# Patient Record
Sex: Male | Born: 1990 | Race: White | Hispanic: No | Marital: Single | Smoking: Never smoker
Health system: Southern US, Community
[De-identification: ages and names within clinical notes are randomized; demographics above are authoritative.]

## PROBLEM LIST (undated history)

## (undated) DIAGNOSIS — E049 Nontoxic goiter, unspecified: Secondary | ICD-10-CM

## (undated) DIAGNOSIS — E1042 Type 1 diabetes mellitus with diabetic polyneuropathy: Secondary | ICD-10-CM

## (undated) DIAGNOSIS — E1043 Type 1 diabetes mellitus with diabetic autonomic (poly)neuropathy: Secondary | ICD-10-CM

## (undated) DIAGNOSIS — R000 Tachycardia, unspecified: Secondary | ICD-10-CM

## (undated) DIAGNOSIS — E11649 Type 2 diabetes mellitus with hypoglycemia without coma: Secondary | ICD-10-CM

## (undated) DIAGNOSIS — E230 Hypopituitarism: Secondary | ICD-10-CM

## (undated) DIAGNOSIS — E109 Type 1 diabetes mellitus without complications: Secondary | ICD-10-CM

## (undated) DIAGNOSIS — E063 Autoimmune thyroiditis: Secondary | ICD-10-CM

## (undated) DIAGNOSIS — I10 Essential (primary) hypertension: Secondary | ICD-10-CM

## (undated) HISTORY — DX: Hypopituitarism: E23.0

## (undated) HISTORY — DX: Type 1 diabetes mellitus without complications: E10.9

## (undated) HISTORY — DX: Type 1 diabetes mellitus with diabetic autonomic (poly)neuropathy: E10.43

## (undated) HISTORY — PX: OTHER SURGICAL HISTORY: SHX169

## (undated) HISTORY — DX: Type 1 diabetes mellitus with diabetic polyneuropathy: E10.42

## (undated) HISTORY — DX: Type 2 diabetes mellitus with hypoglycemia without coma: E11.649

## (undated) HISTORY — DX: Tachycardia, unspecified: R00.0

## (undated) HISTORY — DX: Nontoxic goiter, unspecified: E04.9

## (undated) HISTORY — DX: Essential (primary) hypertension: I10

## (undated) HISTORY — DX: Autoimmune thyroiditis: E06.3

---
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
## 2010-02-07 ENCOUNTER — Ambulatory Visit: Payer: Self-pay | Admitting: "Endocrinology

## 2010-06-29 ENCOUNTER — Ambulatory Visit: Payer: Self-pay | Admitting: "Endocrinology

## 2010-08-20 ENCOUNTER — Inpatient Hospital Stay (HOSPITAL_COMMUNITY): Admission: EM | Admit: 2010-08-20 | Discharge: 2010-08-23 | Payer: Self-pay | Admitting: Emergency Medicine

## 2010-08-20 ENCOUNTER — Ambulatory Visit: Payer: Self-pay | Admitting: Pulmonary Disease

## 2010-08-29 ENCOUNTER — Ambulatory Visit: Payer: Self-pay | Admitting: Pediatrics

## 2010-10-24 ENCOUNTER — Ambulatory Visit: Payer: Self-pay | Admitting: "Endocrinology

## 2010-12-26 ENCOUNTER — Ambulatory Visit
Admission: RE | Admit: 2010-12-26 | Discharge: 2010-12-26 | Payer: Self-pay | Source: Home / Self Care | Attending: "Endocrinology | Admitting: "Endocrinology

## 2011-02-15 LAB — BASIC METABOLIC PANEL
BUN: 14 mg/dL (ref 6–23)
BUN: 3 mg/dL — ABNORMAL LOW (ref 6–23)
BUN: 4 mg/dL — ABNORMAL LOW (ref 6–23)
BUN: 5 mg/dL — ABNORMAL LOW (ref 6–23)
BUN: 6 mg/dL (ref 6–23)
BUN: 7 mg/dL (ref 6–23)
BUN: 7 mg/dL (ref 6–23)
CO2: 19 mEq/L (ref 19–32)
CO2: 20 mEq/L (ref 19–32)
CO2: 22 mEq/L (ref 19–32)
Calcium: 8.1 mg/dL — ABNORMAL LOW (ref 8.4–10.5)
Calcium: 8.2 mg/dL — ABNORMAL LOW (ref 8.4–10.5)
Calcium: 8.4 mg/dL (ref 8.4–10.5)
Calcium: 8.5 mg/dL (ref 8.4–10.5)
Calcium: 8.5 mg/dL (ref 8.4–10.5)
Calcium: 8.6 mg/dL (ref 8.4–10.5)
Calcium: 8.6 mg/dL (ref 8.4–10.5)
Calcium: 8.6 mg/dL (ref 8.4–10.5)
Calcium: 8.7 mg/dL (ref 8.4–10.5)
Chloride: 101 mEq/L (ref 96–112)
Chloride: 104 mEq/L (ref 96–112)
Chloride: 108 mEq/L (ref 96–112)
Creatinine, Ser: 0.64 mg/dL (ref 0.4–1.5)
Creatinine, Ser: 0.68 mg/dL (ref 0.4–1.5)
Creatinine, Ser: 0.84 mg/dL (ref 0.4–1.5)
Creatinine, Ser: 0.84 mg/dL (ref 0.4–1.5)
Creatinine, Ser: 0.9 mg/dL (ref 0.4–1.5)
Creatinine, Ser: 1.25 mg/dL (ref 0.4–1.5)
Creatinine, Ser: 1.84 mg/dL — ABNORMAL HIGH (ref 0.4–1.5)
GFR calc Af Amer: 58 mL/min — ABNORMAL LOW (ref >60)
GFR calc Af Amer: >60 mL/min (ref >60)
GFR calc Af Amer: >60 mL/min (ref >60)
GFR calc Af Amer: >60 mL/min (ref >60)
GFR calc Af Amer: >60 mL/min (ref >60)
GFR calc Af Amer: >60 mL/min (ref >60)
GFR calc non Af Amer: 48 mL/min — ABNORMAL LOW (ref >60)
GFR calc non Af Amer: >60 mL/min (ref >60)
GFR calc non Af Amer: >60 mL/min (ref >60)
GFR calc non Af Amer: >60 mL/min (ref >60)
GFR calc non Af Amer: >60 mL/min (ref >60)
GFR calc non Af Amer: >60 mL/min (ref >60)
GFR calc non Af Amer: >60 mL/min (ref >60)
GFR calc non Af Amer: >60 mL/min (ref >60)
GFR calc non Af Amer: >60 mL/min (ref >60)
GFR calc non Af Amer: >60 mL/min (ref >60)
GFR calc non Af Amer: >60 mL/min (ref >60)
Glucose, Bld: 136 mg/dL — ABNORMAL HIGH (ref 70–99)
Glucose, Bld: 144 mg/dL — ABNORMAL HIGH (ref 70–99)
Glucose, Bld: 145 mg/dL — ABNORMAL HIGH (ref 70–99)
Glucose, Bld: 148 mg/dL — ABNORMAL HIGH (ref 70–99)
Glucose, Bld: 156 mg/dL — ABNORMAL HIGH (ref 70–99)
Glucose, Bld: 157 mg/dL — ABNORMAL HIGH (ref 70–99)
Glucose, Bld: 161 mg/dL — ABNORMAL HIGH (ref 70–99)
Glucose, Bld: 254 mg/dL — ABNORMAL HIGH (ref 70–99)
Potassium: 3.2 mEq/L — ABNORMAL LOW (ref 3.5–5.1)
Potassium: 3.4 mEq/L — ABNORMAL LOW (ref 3.5–5.1)
Potassium: 3.4 mEq/L — ABNORMAL LOW (ref 3.5–5.1)
Potassium: 3.4 mEq/L — ABNORMAL LOW (ref 3.5–5.1)
Potassium: 3.5 mEq/L (ref 3.5–5.1)
Potassium: 3.8 mEq/L (ref 3.5–5.1)
Potassium: 4 mEq/L (ref 3.5–5.1)
Potassium: 4.5 mEq/L (ref 3.5–5.1)
Potassium: 5.3 mEq/L — ABNORMAL HIGH (ref 3.5–5.1)
Sodium: 135 mEq/L (ref 135–145)
Sodium: 135 mEq/L (ref 135–145)
Sodium: 135 mEq/L (ref 135–145)
Sodium: 137 mEq/L (ref 135–145)
Sodium: 138 mEq/L (ref 135–145)
Sodium: 139 mEq/L (ref 135–145)
Sodium: 139 mEq/L (ref 135–145)
Sodium: 139 mEq/L (ref 135–145)

## 2011-02-15 LAB — DIFFERENTIAL
Basophils Absolute: 0 10*3/uL (ref 0.0–0.1)
Basophils Relative: 0 % (ref 0–1)
Eosinophils Absolute: 0 10*3/uL (ref 0.0–0.7)
Lymphs Abs: 1.4 10*3/uL (ref 0.7–4.0)
Monocytes Absolute: 1.9 10*3/uL — ABNORMAL HIGH (ref 0.1–1.0)
Neutro Abs: 24.4 10*3/uL — ABNORMAL HIGH (ref 1.7–7.7)

## 2011-02-15 LAB — GLUCOSE, CAPILLARY
Glucose-Capillary: 112 mg/dL — ABNORMAL HIGH (ref 70–99)
Glucose-Capillary: 134 mg/dL — ABNORMAL HIGH (ref 70–99)
Glucose-Capillary: 135 mg/dL — ABNORMAL HIGH (ref 70–99)
Glucose-Capillary: 139 mg/dL — ABNORMAL HIGH (ref 70–99)
Glucose-Capillary: 141 mg/dL — ABNORMAL HIGH (ref 70–99)
Glucose-Capillary: 141 mg/dL — ABNORMAL HIGH (ref 70–99)
Glucose-Capillary: 143 mg/dL — ABNORMAL HIGH (ref 70–99)
Glucose-Capillary: 162 mg/dL — ABNORMAL HIGH (ref 70–99)
Glucose-Capillary: 171 mg/dL — ABNORMAL HIGH (ref 70–99)
Glucose-Capillary: 187 mg/dL — ABNORMAL HIGH (ref 70–99)
Glucose-Capillary: 187 mg/dL — ABNORMAL HIGH (ref 70–99)
Glucose-Capillary: 202 mg/dL — ABNORMAL HIGH (ref 70–99)
Glucose-Capillary: 203 mg/dL — ABNORMAL HIGH (ref 70–99)
Glucose-Capillary: 216 mg/dL — ABNORMAL HIGH (ref 70–99)
Glucose-Capillary: 267 mg/dL — ABNORMAL HIGH (ref 70–99)
Glucose-Capillary: 341 mg/dL — ABNORMAL HIGH (ref 70–99)
Glucose-Capillary: 428 mg/dL — ABNORMAL HIGH (ref 70–99)
Glucose-Capillary: 90 mg/dL (ref 70–99)

## 2011-02-15 LAB — CBC
HCT: 35.8 % — ABNORMAL LOW (ref 39.0–52.0)
Hemoglobin: 11.9 g/dL — ABNORMAL LOW (ref 13.0–17.0)
Hemoglobin: 12.9 g/dL — ABNORMAL LOW (ref 13.0–17.0)
MCH: 30 pg (ref 26.0–34.0)
MCHC: 35.5 g/dL (ref 30.0–36.0)
MCHC: 36.6 g/dL — ABNORMAL HIGH (ref 30.0–36.0)
MCV: 84.2 fL (ref 78.0–100.0)
MCV: 84.2 fL (ref 78.0–100.0)
MCV: 84.6 fL (ref 78.0–100.0)
Platelets: 178 10*3/uL (ref 150–400)
Platelets: 191 10*3/uL (ref 150–400)
Platelets: 204 10*3/uL (ref 150–400)
Platelets: 319 10*3/uL (ref 150–400)
RBC: 4.3 MIL/uL (ref 4.22–5.81)
RBC: 5.49 MIL/uL (ref 4.22–5.81)
RDW: 12.3 % (ref 11.5–15.5)
RDW: 12.4 % (ref 11.5–15.5)
RDW: 12.6 % (ref 11.5–15.5)
WBC: 27.7 10*3/uL — ABNORMAL HIGH (ref 4.0–10.5)
WBC: 7.9 10*3/uL (ref 4.0–10.5)

## 2011-02-15 LAB — URINE CULTURE
Colony Count: NO GROWTH
Culture  Setup Time: 201109182241

## 2011-02-15 LAB — COMPREHENSIVE METABOLIC PANEL
ALT: 12 U/L (ref 0–53)
AST: 16 U/L (ref 0–37)
Albumin: 4.6 g/dL (ref 3.5–5.2)
Alkaline Phosphatase: 80 U/L (ref 39–117)
BUN: 17 mg/dL (ref 6–23)
Chloride: 108 mEq/L (ref 96–112)
Potassium: 6 mEq/L — ABNORMAL HIGH (ref 3.5–5.1)
Sodium: 138 mEq/L (ref 135–145)
Total Bilirubin: 1.4 mg/dL — ABNORMAL HIGH (ref 0.3–1.2)
Total Protein: 7.9 g/dL (ref 6.0–8.3)

## 2011-02-15 LAB — POCT I-STAT 3, VENOUS BLOOD GAS (G3P V)
Acid-base deficit: 13 mmol/L — ABNORMAL HIGH (ref 0.0–2.0)
Acid-base deficit: 15 mmol/L — ABNORMAL HIGH (ref 0.0–2.0)
Bicarbonate: 11.4 mEq/L — ABNORMAL LOW (ref 20.0–24.0)
Bicarbonate: 11.8 mEq/L — ABNORMAL LOW (ref 20.0–24.0)
O2 Saturation: 52 %
O2 Saturation: 90 %
pCO2, Ven: 29.8 mmHg — ABNORMAL LOW (ref 45.0–50.0)
pO2, Ven: 33 mmHg (ref 30.0–45.0)
pO2, Ven: 66 mmHg — ABNORMAL HIGH (ref 30.0–45.0)

## 2011-02-15 LAB — URINALYSIS, ROUTINE W REFLEX MICROSCOPIC
Glucose, UA: >1000 mg/dL — AB
Specific Gravity, Urine: 1.028 (ref 1.005–1.030)
pH: 5.5 (ref 5.0–8.0)

## 2011-02-15 LAB — CULTURE, BLOOD (ROUTINE X 2)
Culture: NO GROWTH
Culture: NO GROWTH

## 2011-02-15 LAB — PHOSPHORUS: Phosphorus: 2.4 mg/dL (ref 2.3–4.6)

## 2011-02-15 LAB — LEGIONELLA ANTIGEN, URINE: Legionella Antigen, Urine: NEGATIVE

## 2011-02-15 LAB — POCT CARDIAC MARKERS
CKMB, poc: <1 ng/mL — ABNORMAL LOW (ref 1.0–8.0)
Troponin i, poc: <0.05 ng/mL (ref 0.00–0.09)

## 2011-02-15 LAB — LACTIC ACID, PLASMA
Lactic Acid, Venous: 0.8 mmol/L (ref 0.5–2.2)
Lactic Acid, Venous: 1.2 mmol/L (ref 0.5–2.2)

## 2011-02-15 LAB — CARBOXYHEMOGLOBIN: Carboxyhemoglobin: 1.6 % — ABNORMAL HIGH (ref 0.5–1.5)

## 2011-02-15 LAB — MRSA PCR SCREENING: MRSA by PCR: NEGATIVE

## 2011-02-15 LAB — MAGNESIUM
Magnesium: 1.6 mg/dL (ref 1.5–2.5)
Magnesium: 1.7 mg/dL (ref 1.5–2.5)
Magnesium: 1.8 mg/dL (ref 1.5–2.5)
Magnesium: 2.1 mg/dL (ref 1.5–2.5)

## 2011-02-15 LAB — KETONES, QUALITATIVE

## 2011-02-15 LAB — URINE MICROSCOPIC-ADD ON

## 2011-02-15 LAB — HEMOCCULT GUIAC POC 1CARD (OFFICE): Fecal Occult Bld: NEGATIVE

## 2011-03-14 ENCOUNTER — Ambulatory Visit (INDEPENDENT_AMBULATORY_CARE_PROVIDER_SITE_OTHER): Payer: BLUE CROSS/BLUE SHIELD | Admitting: "Endocrinology

## 2011-03-14 DIAGNOSIS — G909 Disorder of the autonomic nervous system, unspecified: Secondary | ICD-10-CM

## 2011-03-14 DIAGNOSIS — E049 Nontoxic goiter, unspecified: Secondary | ICD-10-CM

## 2011-03-14 DIAGNOSIS — E1069 Type 1 diabetes mellitus with other specified complication: Secondary | ICD-10-CM

## 2011-03-14 DIAGNOSIS — R000 Tachycardia, unspecified: Secondary | ICD-10-CM

## 2011-03-14 DIAGNOSIS — E1065 Type 1 diabetes mellitus with hyperglycemia: Secondary | ICD-10-CM

## 2011-03-26 ENCOUNTER — Ambulatory Visit: Payer: Self-pay | Admitting: "Endocrinology

## 2011-05-18 ENCOUNTER — Encounter: Payer: Self-pay | Admitting: Pediatrics

## 2011-05-18 DIAGNOSIS — E039 Hypothyroidism, unspecified: Secondary | ICD-10-CM

## 2011-05-18 DIAGNOSIS — E1065 Type 1 diabetes mellitus with hyperglycemia: Secondary | ICD-10-CM

## 2011-11-06 ENCOUNTER — Other Ambulatory Visit: Payer: Self-pay | Admitting: *Deleted

## 2011-11-06 DIAGNOSIS — E1065 Type 1 diabetes mellitus with hyperglycemia: Secondary | ICD-10-CM

## 2011-11-10 LAB — COMPREHENSIVE METABOLIC PANEL
ALT: 10 U/L (ref 0–53)
AST: 11 U/L (ref 0–37)
Albumin: 4.5 g/dL (ref 3.5–5.2)
Calcium: 9.5 mg/dL (ref 8.4–10.5)
Chloride: 99 mEq/L (ref 96–112)
Potassium: 4.4 mEq/L (ref 3.5–5.3)

## 2011-11-10 LAB — T4, FREE: Free T4: 1.22 ng/dL (ref 0.80–1.80)

## 2011-11-10 LAB — MICROALBUMIN / CREATININE URINE RATIO
Creatinine, Urine: 282.8 mg/dL
Microalb, Ur: 1.55 mg/dL (ref 0.00–1.89)

## 2011-11-10 LAB — T3, FREE: T3, Free: 3.5 pg/mL (ref 2.3–4.2)

## 2011-11-10 LAB — TSH: TSH: 4.301 u[IU]/mL (ref 0.350–4.500)

## 2011-11-14 ENCOUNTER — Ambulatory Visit (INDEPENDENT_AMBULATORY_CARE_PROVIDER_SITE_OTHER): Payer: BLUE CROSS/BLUE SHIELD | Admitting: "Endocrinology

## 2011-11-14 ENCOUNTER — Encounter: Payer: Self-pay | Admitting: "Endocrinology

## 2011-11-14 VITALS — BP 127/90 | HR 108 | Wt 130.0 lb

## 2011-11-14 DIAGNOSIS — E063 Autoimmune thyroiditis: Secondary | ICD-10-CM

## 2011-11-14 DIAGNOSIS — E1169 Type 2 diabetes mellitus with other specified complication: Secondary | ICD-10-CM

## 2011-11-14 DIAGNOSIS — E1149 Type 2 diabetes mellitus with other diabetic neurological complication: Secondary | ICD-10-CM

## 2011-11-14 DIAGNOSIS — E1143 Type 2 diabetes mellitus with diabetic autonomic (poly)neuropathy: Secondary | ICD-10-CM

## 2011-11-14 DIAGNOSIS — E11649 Type 2 diabetes mellitus with hypoglycemia without coma: Secondary | ICD-10-CM

## 2011-11-14 DIAGNOSIS — R000 Tachycardia, unspecified: Secondary | ICD-10-CM

## 2011-11-14 DIAGNOSIS — G909 Disorder of the autonomic nervous system, unspecified: Secondary | ICD-10-CM

## 2011-11-14 DIAGNOSIS — E291 Testicular hypofunction: Secondary | ICD-10-CM

## 2011-11-14 DIAGNOSIS — E038 Other specified hypothyroidism: Secondary | ICD-10-CM

## 2011-11-14 DIAGNOSIS — E1065 Type 1 diabetes mellitus with hyperglycemia: Secondary | ICD-10-CM

## 2011-11-14 DIAGNOSIS — E1142 Type 2 diabetes mellitus with diabetic polyneuropathy: Secondary | ICD-10-CM

## 2011-11-14 DIAGNOSIS — G609 Hereditary and idiopathic neuropathy, unspecified: Secondary | ICD-10-CM

## 2011-11-14 DIAGNOSIS — I10 Essential (primary) hypertension: Secondary | ICD-10-CM

## 2011-11-14 LAB — GLUCOSE, POCT (MANUAL RESULT ENTRY): POC Glucose: 156

## 2011-11-14 LAB — POCT GLYCOSYLATED HEMOGLOBIN (HGB A1C): Hemoglobin A1C: 7.8

## 2011-11-14 MED ORDER — LISINOPRIL 5 MG PO TABS: 5.0000 mg | ORAL_TABLET | Freq: Every day | ORAL | Status: DC

## 2011-11-14 MED ORDER — LEVOTHYROXINE SODIUM 25 MCG PO TABS: 25.0000 ug | ORAL_TABLET | Freq: Every day | ORAL | Status: DC

## 2011-11-14 NOTE — Patient Instructions (Signed)
Followup visit with me in 3 months. Call me on Saturday evening 11/24/11 to discuss his blood sugar results. New basal rates are as follows: At midnight, 0.95 units per hour. At 5 AM, 0.95 units per hour. At 9 AM, 1.05 units per hour. At noon, 1.25 units per hour. At 6 PM, 1.70 units per hour. At 9 PM, 1.30 units per hour.

## 2011-11-14 NOTE — Progress Notes (Signed)
Subjective:  Patient Name: Scott Moran Date of Birth: [birth year]  MRN: [id number]  Scott Moran  presents to the office today for follow-up of his type 1 diabetes mellitus, goiter, hypoglycemia, depression, hypothyroidism, thyroiditis, autonomic neuropathy, tachycardia, nonproliferative diabetic retinopathy, fatigue, hypogonadism.  HISTORY OF PRESENT ILLNESS:   Scott Moran is a 20 y.o. Caucasian young man. Scott Moran was unaccompanied.  1. I first saw the patient in consultation on 03/27/05 in the [facility]patient [facility] of [facility] in [place], [place]. He been referred by his primary care provider, Dr. Egbert Garibaldi , [facility], her evaluation and treatment of poorly controlled type 1 diabetes. The patient was 20 years old.  A. The patient had been diagnosed with type 1 diabetes in February of 1995 at age 48. He was on an insulin pump at that time I first met him. His hemoglobin A1c had increased from 7.1% in December of 2005 to 8.2% in April 2006. He was having frequent snacks that he was not taking boluses for. He was also frequently noncompliant with checking blood sugars and taking meal boluses. I adjusted his basal rates.  B. At his next [place] visit on 06/19/05, he was still very noncompliant. Mental status was normal. He had 25 g goiter. His father was serving in [place] with the [employer]. There wa a lot of stress at home. I again adjusted his basal rates.  C. At the end of July 2006 we closed her satellite office in [place]. The patient elected to have me follow him at our clinic here in [place]. I have followed him here ever since. 2. During the past six years, the patient has had several clinical issues.   A. T1DM: The patient's blood glucose control has been fairly good for a teenager. His maximum hemoglobin A1c occurred on 04/16/06 when the hemoglobin A1c was 12.1%. Since then the hemoglobin A1c values have varied between 6.9% and 8.4%. He is still fairly  noncompliant with checking blood sugars, bolussing, and changing his insulin pump sites on time, but he is more compliant and more careful than he used to be.   B. Goiter, Hashimoto's thyroiditis, and hypothyroidism: In late 2008 and early 2009 the patient developed hypothyroidism secondary to Hashimoto's disease. I started him on Synthroid, 25 mcg per day. When he takes his medication he is euthyroid.  C. Autonomic neuropathy and tachycardia: In late 2008 he developed autonomic neuropathy which manifested as tachycardia.These problems vary in parallel with his BGs and HbA1c's.  D. Non-proliferative diabetic retinopathy: This problem was identified during a diabetic eye exam in June 2010.  E. Hypogonadotropic hypogonadism: The patient has always been somewhat lackadaisical and relatively passive. On 06/29/10 his FSH was 4.9, LH was 2.4, testosterone was 399.66, and free testosterone was 79.8. The testosterone and free testosterone were relatively low for an 20 year old young man. At the same time the [facility] and LH while "normal", appeared to be somewhat low for the level of testosterone he had. We discussed the options for treatment including exercise and medication touch his AndroGel. The patient chose the exercise option. By 10/19/10 the [facility] was 6.8, the LH was 3.2, total testosterone was 615.88 and the free testosterone was 131.6. It may be that when he was more depressed he has less gonadotropic function. This hypogonadotropic hypogonadism may have resolved. We will continue to followup on this issue over time.  F. The patient has been diagnosed with depression at different times in the past. For some time he took Remeron (mirtazapine), but  discontinued this in early 2012. 3. The patient's last [facility] visit was on 12/26/10.  In the interim, he is not taking Synthroid reliably. He is also not getting insulin boluses reliably. He is somewhat better about checking blood sugars. He denies any major depressive  episodes. . Pertinent Review of Systems:  Constitutional: The patient feels pretty god today. He wakes up around 2 to 3 PM and stays up late at night.  He has no significant complaints. Eyes: Vision is good with classes. There are no significant eye complaints. Last eye exam was November 2011. Neck: The patient has no complaints of anterior neck swelling, soreness, tenderness,  pressure, discomfort, or difficulty swallowing.  Heart: Heart rate sometimes feels fast. Heart rate increases with exercise or other physical activity. The patient has no complaints of palpitations, irregular heat beats, chest pain, or chest pressure. Gastrointestinal: Bowel movents seem normal. The patient has no complaints of excessive hunger, acid reflux, upset stomach, stomach aches or pains, diarrhea, or constipation. Legs: Muscle mass and strength seem normal. There are no complaints of numbness, tingling, burning, or pain. No edema is noted. Feet: There are no obvious foot problems. There are no complaints of numbness, tingling, burning, or pain. No edema is noted. GU: Patient rarely has A.M. Erections. He he is able to gain and maintain. Hypoglycemia: Most low BGs occur from noon to 3 PM, especially when he sleeps in late.  Emotional/psychological: The patient states he is is not depressed. He is "content". 5. BG printout: The patient is checking his blood sugars anywhere from 1-4 times daily. He averages 2 BG checks per day. He boluses anywhere from 0-4 times daily. He averages less than 1 bolus per day. He may go 3 days without bolusing.   PAST MEDICAL, FAMILY, AND SOCIAL HISTORY:  No past medical history on file.  No family history on file.  Current outpatient prescriptions:insulin aspart (NOVOLOG) 100 UNIT/ML injection, Inject into the skin 3 (three) times daily before meals.  , Disp: , Rfl: ;  Levothyroxine Sodium (SYNTHROID PO), Take by mouth. , Disp: , Rfl: ;  Loratadine (CLARITIN PO), Take by mouth.  ,  Disp: , Rfl:   Allergies as of 11/14/2011  . (No Known Allergies)    1. Work and Family: Patient is no longer attending junior college, in part due to family financial problems. Father is still unemployed. The patient is not working and not does not appear to be looking for work. 2. Activities: Few physical activities. 3. Smoking, alcohol, or drugs: None 4. Primary Care Provider: He no longer has a primary care provider.  ROS: There are no other significant problems involving Cecilia's other body systems.   Objective:  Vital Signs:  BP 127/90  Pulse 108  Wt 130 lb (58.968 kg)   Ht Readings from Last 3 Encounters:  No data found for Ht   Wt Readings from Last 3 Encounters:  11/14/11 130 lb (58.968 kg)   There is no height on file to calculate BSA.  PHYSICAL EXAM:  Constitutional: The patient appears healthy and well nourished.  Face: The face appears normal.  Eyes: There is no obvious arcus or proptosis. Moisture appears normal. Mouth: The oropharynx and tongue appear normal. Oral moisture is normal. Neck: The neck appears to be visibly normal. No carotid bruits are noted. The thyroid gland is 20+ grams in size. Right lobe is at the upper limit of normal for size. The left lobe is mildly enlarged. The consistency of the thyroid  gland is slightly firm. The thyroid gland is not tender to palpation. Lungs: The lungs are clear to auscultation. Air movement is good. Heart: Heart rate and rhythm are regular. Heart sounds S1 and S2 are normal. I did not appreciate any pathologic cardiac murmurs. Abdomen: The abdomen appears to be normal in size. Bowel sounds are normal. There is no obvious hepatomegaly, splenomegaly, or other mass effect.  Arms: Muscle size and bulk are normal for age. Hands: There is no obvious tremor. Phalangeal and metacarpophalangeal joints are normal. Palmar muscles are normal. Palmar skin is normal. Palmar moisture is also normal. Legs: Muscles appear normal for  age. No edema is present. Feet: Feet are normally formed. Dorsalis pedal pulses are normal 2+ on the right and 1+ on the left.. Neurologic: Strength is normal for age in both the upper and lower extremities. Muscle tone is normal. Sensation to touch is normal in both legs, but decreased in both heels.    LAB DATA:  Recent Results (from the past 504 hour(s))  TSH   Collection Time   11/06/11  4:01 PM      Component Value Range   TSH 4.301  0.350 - 4.500 (uIU/mL)  T3, FREE   Collection Time   11/06/11  4:01 PM      Component Value Range   T3, Free 3.5  2.3 - 4.2 (pg/mL)  T4, FREE   Collection Time   11/06/11  4:01 PM      Component Value Range   Free T4 1.22  0.80 - 1.80 (ng/dL)  COMPREHENSIVE METABOLIC PANEL   Collection Time   11/06/11  4:01 PM      Component Value Range   Sodium 138  135 - 145 (mEq/L)   Potassium 4.4  3.5 - 5.3 (mEq/L)   Chloride 99  96 - 112 (mEq/L)   CO2 29  19 - 32 (mEq/L)   Glucose, Bld 250 (*) 70 - 99 (mg/dL)   BUN 10  6 - 23 (mg/dL)   Creat 4.54  0.98 - 1.19 (mg/dL)   Total Bilirubin 0.6  0.3 - 1.2 (mg/dL)   Alkaline Phosphatase 57  39 - 117 (U/L)   AST 11  0 - 37 (U/L)   ALT 10  0 - 53 (U/L)   Total Protein 6.5  6.0 - 8.3 (g/dL)   Albumin 4.5  3.5 - 5.2 (g/dL)   Calcium 9.5  8.4 - 14.7 (mg/dL)  LIPID PANEL   Collection Time   11/06/11  4:01 PM      Component Value Range   Cholesterol 132  0 - 200 (mg/dL)   Triglycerides 57  <829 (mg/dL)   HDL 53  >56 (mg/dL)   Total CHOL/HDL Ratio 2.5     VLDL 11  0 - 40 (mg/dL)   LDL Cholesterol 68  0 - 99 (mg/dL)  MICROALBUMIN / CREATININE URINE RATIO   Collection Time   11/06/11  4:01 PM      Component Value Range   Microalb, Ur 1.55  0.00 - 1.89 (mg/dL)   Creatinine, Urine 213.0     Microalb Creat Ratio 5.5  0.0 - 30.0 (mg/g)  GLUCOSE, POCT (MANUAL RESULT ENTRY)   Collection Time   11/14/11 10:02 AM      Component Value Range   POC Glucose 156    POCT GLYCOSYLATED HEMOGLOBIN (HGB A1C)   Collection  Time   11/14/11 10:02 AM      Component Value Range   Hemoglobin A1C  7.8       Assessment and Plan:   ASSESSMENT:  1. Type 1 diabetes mellitus: The patient has a fairly good hemoglobin A1c today, but still has a lot of variability in blood glucose levels. He is having many highs as well as many lows. 2. Hypoglycemia: Most of his lower blood sugars are in the period from noon to 3 PM. 3. Autonomic neuropathy and tachycardia: Patient still has these problems because he still has a lot of higher blood sugars. 4. Peripheral neuropathy: Patient still has this problem because he still has a lot of higher blood sugars 5. Hypothyroid: He is mildly hypothyroid now. He needs to take his Synthroid on a regular basis. 6. Hypogonadotropic hypogonadism: Once his thyroid hormone levels are normal for several months, we can reassess this condition. Given the autoimmune nature of his type 1 diabetes and his Hashimoto's disease and hypothyroidism, it is entirely possible that he has had some autoimmune hypophysitis. Or, as mentioned above, depression may have been a factor in causing transient hypogonadotropic hypogonadism. 7. Hypertension: Blood pressure is too high. We need to start lisinopril both for treatment of hypertension and for renal protection. 8. Hashimoto's disease: Thyroiditis is clinically quiescent. 9. Depression: He does not appear depressed today.  10. Noncompliance: He still does what he wants when he wants to do it. He knows what to do to take care of his medical problems, but he is very erratic with his compliance and adherence to his medical plan. Nevertheless, he is much better than he used to be.  PLAN:  1. Diagnostic: TFTs, LH, FSH, and testosterone before next visit 2. Therapeutic: Start lisinopril, 5 mg per day. I changed his basal rates as follows: At midnight, 0.95 units per hour. At 5 AM, 0.95 units per hour. At 9 AM, 1.05 units per hour. At noon, 1.25 units per hour. At 6 PM,  1.70 units per hour. At 9 PM, 1.30 units per hour. Estimated to me on Saturday evening so we can discuss his blood glucose results. 3. Patient education: We talked about his hypogonadism at some length. We'll need to reassess his hypothalamic-pituitary-testicular axis once the thyroid hormone levels are normal. 4. Follow-up: 3 months  Level of Service: This visit lasted in excess of 40 minutes. More than 50% of the visit was devoted to counseling.  David Stall, MD 11/14/2011 10:18 AM

## 2012-01-20 ENCOUNTER — Encounter: Payer: Self-pay | Admitting: "Endocrinology

## 2012-01-20 DIAGNOSIS — E109 Type 1 diabetes mellitus without complications: Secondary | ICD-10-CM | POA: Insufficient documentation

## 2012-01-20 DIAGNOSIS — E049 Nontoxic goiter, unspecified: Secondary | ICD-10-CM | POA: Insufficient documentation

## 2012-01-20 DIAGNOSIS — E1043 Type 1 diabetes mellitus with diabetic autonomic (poly)neuropathy: Secondary | ICD-10-CM | POA: Insufficient documentation

## 2012-01-20 DIAGNOSIS — E1042 Type 1 diabetes mellitus with diabetic polyneuropathy: Secondary | ICD-10-CM | POA: Insufficient documentation

## 2012-01-20 DIAGNOSIS — E063 Autoimmune thyroiditis: Secondary | ICD-10-CM | POA: Insufficient documentation

## 2012-01-20 DIAGNOSIS — R000 Tachycardia, unspecified: Secondary | ICD-10-CM | POA: Insufficient documentation

## 2012-01-20 DIAGNOSIS — E230 Hypopituitarism: Secondary | ICD-10-CM | POA: Insufficient documentation

## 2012-01-20 DIAGNOSIS — I10 Essential (primary) hypertension: Secondary | ICD-10-CM | POA: Insufficient documentation

## 2012-01-20 DIAGNOSIS — E11649 Type 2 diabetes mellitus with hypoglycemia without coma: Secondary | ICD-10-CM | POA: Insufficient documentation

## 2012-02-12 LAB — TESTOSTERONE, FREE, TOTAL, SHBG: Testosterone-% Free: 1.9 % (ref 1.6–2.9)

## 2012-02-12 LAB — FOLLICLE STIMULATING HORMONE: FSH: 6 m[IU]/mL (ref 1.4–18.1)

## 2012-02-13 ENCOUNTER — Encounter: Payer: Self-pay | Admitting: "Endocrinology

## 2012-02-13 ENCOUNTER — Ambulatory Visit (INDEPENDENT_AMBULATORY_CARE_PROVIDER_SITE_OTHER): Payer: BLUE CROSS/BLUE SHIELD | Admitting: "Endocrinology

## 2012-02-13 VITALS — BP 127/87 | HR 103 | Wt 126.2 lb

## 2012-02-13 DIAGNOSIS — E038 Other specified hypothyroidism: Secondary | ICD-10-CM

## 2012-02-13 DIAGNOSIS — I10 Essential (primary) hypertension: Secondary | ICD-10-CM

## 2012-02-13 DIAGNOSIS — R000 Tachycardia, unspecified: Secondary | ICD-10-CM

## 2012-02-13 DIAGNOSIS — E1065 Type 1 diabetes mellitus with hyperglycemia: Secondary | ICD-10-CM

## 2012-02-13 DIAGNOSIS — F329 Major depressive disorder, single episode, unspecified: Secondary | ICD-10-CM

## 2012-02-13 DIAGNOSIS — G609 Hereditary and idiopathic neuropathy, unspecified: Secondary | ICD-10-CM

## 2012-02-13 DIAGNOSIS — E230 Hypopituitarism: Secondary | ICD-10-CM

## 2012-02-13 DIAGNOSIS — R634 Abnormal weight loss: Secondary | ICD-10-CM

## 2012-02-13 DIAGNOSIS — E1142 Type 2 diabetes mellitus with diabetic polyneuropathy: Secondary | ICD-10-CM

## 2012-02-13 DIAGNOSIS — F32A Depression, unspecified: Secondary | ICD-10-CM

## 2012-02-13 DIAGNOSIS — E1043 Type 1 diabetes mellitus with diabetic autonomic (poly)neuropathy: Secondary | ICD-10-CM

## 2012-02-13 DIAGNOSIS — E1149 Type 2 diabetes mellitus with other diabetic neurological complication: Secondary | ICD-10-CM

## 2012-02-13 DIAGNOSIS — G909 Disorder of the autonomic nervous system, unspecified: Secondary | ICD-10-CM

## 2012-02-13 DIAGNOSIS — E1169 Type 2 diabetes mellitus with other specified complication: Secondary | ICD-10-CM

## 2012-02-13 DIAGNOSIS — E11649 Type 2 diabetes mellitus with hypoglycemia without coma: Secondary | ICD-10-CM

## 2012-02-13 DIAGNOSIS — E063 Autoimmune thyroiditis: Secondary | ICD-10-CM

## 2012-02-13 DIAGNOSIS — E236 Other disorders of pituitary gland: Secondary | ICD-10-CM

## 2012-02-13 DIAGNOSIS — E1049 Type 1 diabetes mellitus with other diabetic neurological complication: Secondary | ICD-10-CM

## 2012-02-13 MED ORDER — INSULIN GLARGINE 100 UNIT/ML ~~LOC~~ SOLN: SUBCUTANEOUS | Status: DC

## 2012-02-13 MED ORDER — INSULIN ASPART 100 UNIT/ML ~~LOC~~ SOLN: SUBCUTANEOUS | Status: DC

## 2012-02-13 MED ORDER — INSULIN PEN NEEDLE 31G X 5 MM MISC: Status: DC

## 2012-02-13 MED ORDER — GLUCOSE BLOOD VI STRP: ORAL_STRIP | Status: DC

## 2012-02-13 NOTE — Progress Notes (Signed)
Subjective:  Patient Name: Scott Moran Date of Birth: Aug 17, 1991  MRN: [id number]  Scott Moran  presents to the office today for follow-up of his type 1 diabetes mellitus, goiter, hypoglycemia, depression, hypothyroidism, thyroiditis, autonomic neuropathy, tachycardia, nonproliferative diabetic retinopathy, fatigue, hypogonadism.  HISTORY OF PRESENT ILLNESS:   [name] is a 21 y.o. Caucasian young man. [name] was unaccompanied.  1. I first saw the patient in consultation on 03/27/05 in the [facility]patient [facility] of [facility] in [place], [place]. He had been referred by his primary care provider, Dr. Egbert Garibaldi , [place], [facility], for evaluation and treatment of poorly controlled type 1 diabetes. The patient was 21 years old.  A. The patient had been diagnosed with type 1 diabetes in February of 1995 at age 52. He was on an insulin pump at that time I first met him. His hemoglobin A1c had increased from 7.1% in December of 2005 to 8.2% in April 2006. He was having frequent snacks that he was not taking boluses for. He was also frequently noncompliant with checking blood sugars and taking meal boluses. I adjusted his basal rates.  B. At his next [place] visit on 06/19/05, he was still very noncompliant. Mental status was normal. He had 25 g goiter. His father was serving in [place] with the [employer]. There was a lot of stress at home. I again adjusted his basal rates.  C. At the end of July 2006 we closed our satellite office in [place]. The patient elected to have me follow him at our clinic here in [place]. I have followed him here ever since. 2. During the past six years, the patient has had several clinical issues.   A. T1DM: The patient's blood glucose control has been fairly good for a teenager. His maximum hemoglobin A1c occurred on 04/16/06 when the hemoglobin A1c was 12.1%. Since then the hemoglobin A1c values have varied between 6.9% and 8.4%. He is still  fairly noncompliant with checking blood sugars, bolusing, and changing his insulin pump sites on time, but he is more compliant and more careful than he used to be.   B. Goiter, Hashimoto's thyroiditis, and hypothyroidism: In late 2008 and early 2009 the patient developed hypothyroidism secondary to Hashimoto's disease. I started him on Synthroid, 25 mcg per day. When he takes his medication, he is euthyroid.  C. Autonomic neuropathy and tachycardia: In late 2008 he developed autonomic neuropathy which manifested as tachycardia.These problems vary in parallel with his BGs and HbA1c's.  D. Non-proliferative diabetic retinopathy: This problem was identified during a diabetic eye exam in June 2010.  E. Hypogonadotropic hypogonadism: The patient has always been somewhat lackadaisical and relatively passive. On 06/29/10 his FSH was 4.9, LH was 2.4, testosterone was 399.66, and free testosterone was 79.8. The testosterone and free testosterone were relatively low for an 21 year old young man. At the same time the [facility] and LH while "normal", appeared to be somewhat low for the level of testosterone he had. We discussed the options for treatment including exercise and medication touch his AndroGel. The patient chose the exercise option. By 10/19/10 the [facility] was 6.8, the LH was 3.2, total testosterone was 615.88 and the free testosterone was 131.6. It may be that when he was more depressed he had less gonadotropic function. This hypogonadotropic hypogonadism may have resolved. We will continue to followup on this issue over time.  F. The patient has been diagnosed with depression at different times in the past. For some time he took Remeron (mirtazapine),  but discontinued this in early 2012. 3. The patient's last [facility] visit was on 11/14/11.  In the interim, he is taking Synthroid and lisinopril more reliably. He is still not giving insulin boluses reliably. He is not very good about checking blood sugars. He denies any  major depressive episodes. 4. Pertinent Review of Systems:  Constitutional: The patient feels pretty good today, but is upset about his weight loss. He wakes up around 2 to 3 PM and stays up late at night.  He has no significant complaints. Eyes: Vision is good with glasses. There are no significant eye complaints. Last eye exam was November 2011. Neck: The patient has no complaints of anterior neck swelling, soreness, tenderness,  pressure, discomfort, or difficulty swallowing.  Heart: Heart rate sometimes feels fast. Heart rate increases with exercise or other physical activity. The patient has no complaints of palpitations, irregular heat beats, chest pain, or chest pressure. Gastrointestinal: Bowel movents seem normal. The patient has no complaints of excessive hunger, acid reflux, upset stomach, stomach aches or pains, diarrhea, or constipation. Legs: Muscle mass and strength seem normal. There are no complaints of numbness, tingling, burning, or pain. No edema is noted. Feet: There are no obvious foot problems. There are no complaints of numbness, tingling, burning, or pain. No edema is noted. Hypoglycemia: Most low BGs occur from noon to 3 PM, especially when he sleeps in late.  Emotional/psychological: The patient states he is not depressed.  5. BG printout: The patient is checking his blood sugars anywhere from 1-4 times daily. He averages 2 BG checks per day. BGs are often low in the late morning when he sleeps in late.    PAST MEDICAL, FAMILY, AND SOCIAL HISTORY:  Past Medical History  Diagnosis Date  . Type 1 diabetes mellitus not at goal   . Hypoglycemia associated with diabetes   . Goiter   . Thyroiditis, autoimmune   . Hypothyroidism, acquired, autoimmune   . Type 1 diabetes mellitus with diabetic autonomic neuropathy   . Tachycardia   . DM type 1 with diabetic peripheral neuropathy   . Hypertension   . Hypogonadotropic hypogonadism     Family History  Problem Relation  Age of Onset  . Diabetes Maternal Uncle   . Hypertension Maternal Grandmother   . Thyroid disease Neg Hx   . Obesity Neg Hx   . Kidney disease Neg Hx   . Cancer Neg Hx   . Anemia Neg Hx     Current outpatient prescriptions:insulin aspart (NOVOLOG) 100 UNIT/ML injection, Inject into the skin 3 (three) times daily before meals.  , Disp: , Rfl: ;  levothyroxine (SYNTHROID) 25 MCG tablet, Take 1 tablet (25 mcg total) by mouth daily., Disp: 30 tablet, Rfl: 6;  lisinopril (PRINIVIL,ZESTRIL) 5 MG tablet, Take 1 tablet (5 mg total) by mouth daily., Disp: 30 tablet, Rfl: 11;  Loratadine (CLARITIN PO), Take by mouth.  , Disp: , Rfl:   Allergies as of 02/13/2012  . (No Known Allergies)    1. Work and Family: Patient is no longer attending junior college, in part due to family financial problems. Father is still unemployed, but is actively looking for work. The patient is not working and not does not appear that interested in looking for work. 2. Activities: Few physical activities. 3. Smoking, alcohol, or drugs: None 4. Primary Care Provider: He no longer has a primary care provider.  ROS: There are no other significant problems involving Maveric's other body systems.   Objective:  Vital Signs:  BP 127/87  Pulse 103  Wt 126 lb 3.2 oz (57.244 kg)   Ht Readings from Last 3 Encounters:  No data found for Ht   Wt Readings from Last 3 Encounters:  02/13/12 126 lb 3.2 oz (57.244 kg)  11/14/11 130 lb (58.968 kg)   There is no height on file to calculate BSA.  PHYSICAL EXAM:  Constitutional: The patient appears healthy and well nourished.  Face: The face appears normal.  Eyes: There is no obvious arcus or proptosis. Moisture appears normal. Mouth: The oropharynx and tongue appear normal. Oral moisture is normal. Neck: The neck appears to be visibly normal. No carotid bruits are noted. The thyroid gland is 20+ grams in size. Right lobe is at the upper limit of normal for size. The left lobe  is mildly enlarged. The consistency of the thyroid gland is slightly firm. The thyroid gland is not tender to palpation. Lungs: The lungs are clear to auscultation. Air movement is good. Heart: Heart rate and rhythm are regular. Heart sounds S1 and S2 are normal. I did not appreciate any pathologic cardiac murmurs. Abdomen: The abdomen is normal in size. Bowel sounds are normal. There is no obvious hepatomegaly, splenomegaly, or other mass effect.  Arms: Muscle size and bulk are normal for age. Hands: There is no obvious tremor. Phalangeal and metacarpophalangeal joints are normal. Palmar muscles are normal. Palmar skin is normal. Palmar moisture is also normal. Legs: Muscles appear normal for age. No edema is present. Feet: Feet are normally formed. Dorsalis pedal pulses are normal 2+ bilaterally. Neurologic: Strength is normal for age in both the upper and lower extremities. Muscle tone is normal. Sensation to touch is normal in both legs, but decreased in both heels.    LAB DATA: Hemoglobin A1c is 7.8%.   Recent Results (from the past 504 hour(s))  T3, FREE   Collection Time   02/11/12 11:28 AM      Component Value Range   T3, Free 3.5  2.3 - 4.2 (pg/mL)  T4, FREE   Collection Time   02/11/12 11:28 AM      Component Value Range   Free T4 1.11  0.80 - 1.80 (ng/dL)  TSH   Collection Time   02/11/12 11:28 AM      Component Value Range   TSH 2.647  0.350 - 4.500 (uIU/mL)  TESTOSTERONE, FREE, TOTAL   Collection Time   02/11/12 11:28 AM      Component Value Range   Testosterone 531.25  250 - 890 (ng/dL)   Sex Hormone Binding 38  13 - 71 (nmol/L)   Testosterone, Free 103.3  47.0 - 244.0 (pg/mL)   Testosterone-% Freee. 1.9  1.6 - 2.9 (%)  FOLLICLE STIMULATING HORMONE   Collection Time   02/11/12 11:28 AM      Component Value Range   FSH 6.0  1.4 - 18.1 (mIU/mL)  LUTEINIZING HORMONE   Collection Time   02/11/12 11:28 AM      Component Value Range   LH 2.5  1.5 - 9.3 (mIU/mL)    GLUCOSE, POCT (MANUAL RESULT ENTRY)   Collection Time   02/13/12 12:48 PM      Component Value Range   POC Glucose 149    POCT GLYCOSYLATED HEMOGLOBIN (HGB A1C)   Collection Time   02/13/12 12:56 PM      Component Value Range   Hemoglobin A1C 7.8       Assessment and Plan:   ASSESSMENT:  1. Type 1 diabetes mellitus: The patient has a fairly good hemoglobin A1c today, but still has a lot of variability in blood glucose levels. He is having many highs as well as many lows.He is erratic in BG testing and bolusing, which definitely contributes to his variability. Since he frequently forgets to use the temporary basal rate function when he plans to sleep in late, I will reduce the basal rates from midnight to 9 AM.  2. Hypoglycemia: Most of his lower blood sugars occur around noon-time, especially if he sleeps in late and does not use a low-enough temporary basal rate. 3. Autonomic neuropathy and tachycardia: Patient still has these problems because he still has a lot of higher blood sugars. 4. Peripheral neuropathy: Patient still has this problem because he still has a lot of higher blood sugars 5. Hypothyroid: He is euthyroid now. He needs to take his Synthroid on a regular basis. 6. Hypogonadotropic hypogonadism: His testosterone level is much better. His gonadotropins are measurable.  7. Hypertension: Blood pressure is still somewhat high. He needs to take his lisinopril daily. 8. Hashimoto's disease: Thyroiditis is clinically quiescent. 9. Depression: He does not appear depressed today.  10. Noncompliance: He still does what he wants when he wants to do it. 11. Weight loss: He definitely needs more insulin to ensure that he transports adequate nutrients into his cells. He may also need to eat more.   PLAN:  1. Diagnostic: TFTs at next visit. 2. Therapeutic: I changed his basal rates as follows: At midnight, 0.85 units per hour. At 5 AM, 0.85 units per hour. At 9 AM, 1.05 units per  hour. At noon, 1.25 units per hour. At 6 PM, 1.70 units per hour. At 9 PM, 1.30 units per hour.  3. Patient education: We talked bout his BG control. He knows what he should do and knows how to do it, but can't make himself consistently follow his DM care plan. He is doing better than he used to do. 4. Follow-up: 3 months  Level of Service: This visit lasted in excess of 40 minutes. More than 50% of the visit was devoted to counseling.  David Stall, MD 02/13/2012 1:26 PM

## 2012-02-13 NOTE — Patient Instructions (Signed)
Followup visit in 3 months.Rates are as follows: At midnight, 0.85 units per hour. At 5 AM, 0.85 units per hour. At 9 AM, 1.05 units per hour. At noon, 1.25 units per hour. At 6 PM, 1.70 units per hour. At 9 PM, 1.30 units per hour. Call in 2 weeks to discuss blood glucose values. If your pump fails, begin using Lantus at 30 units each evening. If your pump fails, begin using Novolog at mealtimes and bedtime as needed. At mealtimes for correction dose, give one unit of insulin for every 50 points of blood sugar greater than 150. At mealtimes, for a food dose, give one unit of insulin for every 15 g of carbohydrates.

## 2012-03-10 ENCOUNTER — Ambulatory Visit (INDEPENDENT_AMBULATORY_CARE_PROVIDER_SITE_OTHER): Payer: BLUE CROSS/BLUE SHIELD | Admitting: *Deleted

## 2012-03-10 VITALS — BP 116/82 | HR 101 | Wt 126.2 lb

## 2012-03-10 DIAGNOSIS — E1169 Type 2 diabetes mellitus with other specified complication: Secondary | ICD-10-CM

## 2012-03-10 DIAGNOSIS — IMO0002 Reserved for concepts with insufficient information to code with codable children: Secondary | ICD-10-CM

## 2012-03-10 DIAGNOSIS — E1065 Type 1 diabetes mellitus with hyperglycemia: Secondary | ICD-10-CM

## 2012-03-19 ENCOUNTER — Other Ambulatory Visit: Payer: Self-pay | Admitting: *Deleted

## 2012-03-19 MED ORDER — GLUCOSE BLOOD VI STRP: ORAL_STRIP | Status: DC

## 2012-03-31 ENCOUNTER — Other Ambulatory Visit: Payer: Self-pay | Admitting: *Deleted

## 2012-03-31 DIAGNOSIS — E1065 Type 1 diabetes mellitus with hyperglycemia: Secondary | ICD-10-CM

## 2012-03-31 MED ORDER — INSULIN ASPART 100 UNIT/ML ~~LOC~~ SOLN: SUBCUTANEOUS | Status: DC

## 2012-04-07 ENCOUNTER — Other Ambulatory Visit: Payer: Self-pay | Admitting: *Deleted

## 2012-04-07 DIAGNOSIS — E039 Hypothyroidism, unspecified: Secondary | ICD-10-CM

## 2012-04-14 LAB — TSH: TSH: 3.344 u[IU]/mL (ref 0.350–4.500)

## 2012-04-14 LAB — T4, FREE: Free T4: 1.11 ng/dL (ref 0.80–1.80)

## 2012-04-16 ENCOUNTER — Ambulatory Visit: Payer: BLUE CROSS/BLUE SHIELD | Admitting: *Deleted

## 2012-04-16 ENCOUNTER — Ambulatory Visit (INDEPENDENT_AMBULATORY_CARE_PROVIDER_SITE_OTHER): Payer: BLUE CROSS/BLUE SHIELD | Admitting: *Deleted

## 2012-04-16 VITALS — BP 114/74 | HR 88 | Wt 128.6 lb

## 2012-04-16 DIAGNOSIS — E1065 Type 1 diabetes mellitus with hyperglycemia: Secondary | ICD-10-CM

## 2012-04-16 DIAGNOSIS — IMO0002 Reserved for concepts with insufficient information to code with codable children: Secondary | ICD-10-CM

## 2012-04-16 LAB — GLUCOSE, POCT (MANUAL RESULT ENTRY): POC Glucose: 221

## 2012-04-16 MED ORDER — GLUCAGON (RDNA) 1 MG IJ KIT: 1.0000 mg | PACK | Freq: Once | INTRAMUSCULAR | Status: DC | PRN

## 2012-04-16 MED ORDER — GLUCAGON (RDNA) 1 MG IJ KIT: PACK | INTRAMUSCULAR | Status: DC

## 2012-04-16 NOTE — Progress Notes (Signed)
Scott Moran presents today for evaluation and follow-up on his diabetes self management and carb counting skills. Scott Moran is accompanied by his mother, Beth, however Scott Moran decided Scott Moran wanted to meet with me alone.  A.  BIOGRAPHICAL INFORMATION  1.  Scott Moran is a 20 y.o Caucasian young man with Type 1 Diabetes diagnosed at age 3.    2.  Scott Moran is a high school graduate who had to drop out of community college due to family financial shortfalls.  3.  Scott Moran currently lives at home with his parents.  4.  Scott Moran is unemployed and is uninterested in seeking work.  5.  His 2 major interests are military and cultural history, and learning about the outside world by means of his computer.  6.  Scott Moran is an introverted young man who leads an insular life.  7.  His goals and aspirations:  Dreams of making a history a career field or being an archeologist or both.  8.  Scott Moran's Mother and Dr. Legrande Hao both feel that Scott Moran has a significant problem with depression.  Thus far, however, Scott Moran has denied that Scott Moran is depressed.  B.  DIABETES CARE  1.  Scott Moran has been seen by Dr. Sharnetta Gielow since 03/27/05.  At that time, Scott Moran was already on a Medtronic Insulin Pump.    2.  Scott Moran has recently upgraded to a [id number] insulin pump and now has a continuous glucose monitor.  3.  Scott Moran has been and still is still fairly noncompliant with checking blood sugars, bolusing, and changing his insulin pump sites on time, but Scott Moran is somewhat        more compliant and careful than Scott Moran used to be.   4.  His most recent HbA1c was 7.8 % on 02/13/12  C.  LEARNING ISSUES:  STRENGTHS AND BARRIERS  1.  Scott Moran is a very intelligent and caring person.  2.  Scott Moran is somewhat like an absent minded professor in that Scott Moran becomes engrossed in the things Scott Moran is interested in and passion about, and ignores       real life problems such as diabetes self-care, blood glucose control and long term complications associated with diabetes.  3.  Although Scott Moran is  intelligent enough to understand what Scott Moran needs to do to control/manage his diabetes, Scott Moran frequently doesn't do what Scott Moran needs to do.  D.  DIABETES LEARNING TODAY  1.  Reviewed reading food labels.  2.  Discussed how to identify and subtract insoluble fiber from total carbohydrates on a food label.  3.  Discussed portion visualization and portion control.  4.  Discussed and practiced pouring cereal in bowl & measuring it back into box to determine total grams of carbs for food dose.  Log in address book.  5.  Discussed the importance of using a small address book to log serving sizes of favorite foods and total grams of carbs for food boluses.   6.  Scott Moran prefers to graze on regular Lays potato chips instead of eating full meals, but just guesses on the carb count.  We discussed an easy way to       do portion control for carb counting:  13-15 potato chips = 1 oz.;  Per Calorie [employer] Carb Book, 1 oz. of regular potato chips = 15 grams of carbs.;       Fill sandwich bags with 15 chips each and store them for future snacking; eat 1 bag at a time; cover for 15 g of carbs for each.  7.    Downloaded Insulin Pump Reports identified Scott Moran's averaging 2.6 blood glucose checks daily, 2.3 boluses daily most for food and correction.  28% of Bolus        Scott Moran recommended insulin doses were overridden.  Per Scott Moran, when his blood glucose is high, usually between 4-7 pm, Scott Moran adds about 0.5 units to the       Bolus Scott Moran insulin dose recommendation.  At that time Scott Moran usually feels physically hot, has a very dry mouth and is thirsty.  8.  Compared to 03/10/12 Pump Reports, Scott Moran is checking blood glucose about the same number of times daily; doing fewer food boluses, but more correction       boluses and bolus Scott Moran overrides. Scott Moran continues to go 6-7 days between site changes.  ASSESSMENT: 1.  Scott Moran has some unique personal challenges that affect learning.  2.  Scott Moran is intelligent enough to take good care of his  diabetes, but at present is not motivated to do so. 3.  Scott Moran does have some learning deficits and by working on these deficits we may also work with him to improve his motivation.    PLAN: 1.  Follow-up with Dr. Jennise Both in June as planned. 2.  I have given Scott Moran the option of meeting with me more frequently for diabetes education, counseling and support.  Scott Moran will think about it and let me know.            

## 2012-04-17 NOTE — Patient Instructions (Signed)
   1. Check blood sugars at least 4 times daily: before meals, bedtime and if skipping a meal check BG and do correction dose if needed;  If BG at bedtime is less than   150 mg/dl and he plans to sleep late, take a free 15-30 gram snack. 2. Complete "Calculation of grams of carbs for food dose/bolus" forms for favorite foods for all meals, snacks, desserts, beverages.  Enter alphabetically into  an address book. 3. Use Dual Wave and Square Wave Bolus 1 time prior to next appt with me. 4. Follow-up with me on Wed. 04/16/12 1:30-4:30 pm for Pump Download and Carb Counting Education.

## 2012-04-17 NOTE — Progress Notes (Signed)
Scott Moran presents today for follow-up on his self pump start/pump upgrade to the Medtronic Revel 723 Insulin Pump, Contour Next Link glucose meter and evaluation of his diabetes self management and carb counting skills. He is accompanied by his parents.    A. BIOGRAPHICAL INFORMATION  1. Scott Moran is a 21 y.o Caucasian young man with Type 1 Diabetes diagnosed at age 23.  2. Scott Moran is a high school graduate who had to drop out of community college due to family financial shortfalls.  3. He currently lives at home with his parents.  4. He has 1 older brother who is very successful and active. 5. He is unemployed and is uninterested in seeking work.  6. His 2 major interests are [employer] and cultural history.  7. He is an introverted young man who leads an insular life.  8. [name] and Dr. Fransico Javarri both feel that [name] has a significant problem with depression. Thus far, however, [name] has denied that he is depressed.   B. DIABETES CARE  1. [name] has been seen by Dr. Fransico Tanor since 03/27/05. At that time, [name] was already on a Medtronic Insulin Pump.  2. [name] has been and still is still fairly noncompliant with checking blood sugars, bolusing, and changing his insulin pump sites on time, but he is somewhat more      compliant and careful than he used to be.  3. [name] has recently upgraded to a Medtronic723 Revel insulin pump and now has the Medtronic Real Time continuous glucose monitor.  Scott Moran did his own pump start,  but still needs to learn about the Revel's new features and how to use them. 4. His most recent HbA1c was 7.8 % on 02/13/12.  C. INSULIN PUMP UPGRADE FOLLOW-UP 1.  Pump and Ultra Link download reports for 02/10/12 - 03/10/12 (30 days) indicate:  A.  Checking blood glucose (BG) on average 2.5 times daily.    B.  Out of 75 BG checks, 21.3% were hypoglycemic and 50.7% were hyperglycemic  C.  High variability present with BG's ranging from 35 mg/dl - 409 mg/dl  D.   Rebounds from low to high  E.  Overcorrects from high to low  F.  There's a significant difference between daytime and nighttime BG readings.  2.  Review and discussion regarding the above downloaded reports:  A.  Scott Moran stays up very late, sometimes until 2-3 am;  Usually wakes-up between 11am - 2 pm.  B.  Tends to "graze" during the day on snack foods, i.e. potato chips, instead of eating regular meals  C.  Guesstimates carb counts if he does them at all.  D.  Per Scott Moran, while he knows he should check BGs and take his insulin, he often just forgets or gets side-tracked with something he's doing.  E.  Per Mom, Scott Moran is being lazy and both parents think he is depressed.  Scott Moran denies feeling depressed, but states many times he probably is lazy about it.  F.  He wants to do better and will try harder.  3. 723 Pump Settings checked and the following changes were authorized by Dr. Fransico Tyner:  A.  Max Basal Rate changed:   Time  From       To               12 am   2.0U/hr  2.25 U/hr.   B.  Max Bolus changed from 13.0 Units to 25.0 Units.   C.  Carb Ratios:   Time  From       To               12 am  14.0  15.0   11:30 am 12.0  No Change   4:30 pm 15.0  No Change   D.  Insulin Sensitivity remained the same at 65 for all hours.     E.  Blood glucose Targets (mg/dl) change from 578 mg/dl all the time to the following:   Time     Blood Glucose Range in mg/dl          12 AM  469 - 629     6 AM   110 - 110   10 PM  150-150  4.  Instructed on / set-up new features/programs on the Revel 723 pump.    A.  Missed Bolus Reminder Program:  Alerts set-up for:   1.  5 pm - 8 pm daily   2. 10 pm - 3 am daily  B. Capture Event Option turned ON  C. Scroll Rate.  Can be set for as low as 0.025 U/hr.  Scott Moran's is set for 0.05 U/hr.  C. Most new features on this pump pertain to CGMS Sensor.  Patient has it but elects to wait a while before starting on it.  5.  Reviewed 723 Revel standard and  advanced features/programs that were also on his previous 722 pump, but Scott Moran wasn't using them.  A. Dual Square Wave Boluses: set to ON.  Practiced giving Dual & Square Wave boluses  B. BG Reminder: set to ON  C. Temporary Basal Rate :  Set for % of Basal Rate  D. Alert Type: Vibrate  E. Low Reservoir Warning: set at 20 Units of Insulin  F. Daily Averages: set for 7 days  G. Alarm Clock  H. User Settings: use to save settings  D.  Reviewed Insulin Pump Protocols  1. Hypoglycemia:  Rules of 15/15 & Rule of 30/15.  Often treats without checking or rechecking.  2. Hyperglycemia:  Takes Correction Boluses but does not follow Protocol through.  3. DKA Outpatient Treatment:  Rarely checks for urine ketones.  Not using Protocol often.  4. Exercise:  Scott Moran is not currently physically active, but states he understands how to use it.  5. Sick Day:  Scott Moran relies on his Mom to know this and follow through.  She stated she has used it and understands it.  E. Instruction on Operation and Use of Bayer Contour Next meter which communicates with his new Revel 723 Insulin Pump  1. Effect of extreme temperatures on meter & test strips  2. How and when to use Control Solution:   RN Demonstrated,  Patient/Parents Re-demonstrated  3. How to access and use Memory functions  4. Proper lancing and testing technique  F.  Carb Counting  1. Scott Moran freely admits this is not his strong suit.  2. Mother has always done it or worked with him to figure it out.  3. He wants a quick and easy way to carb count his favorite/frequently eaten foods.   A. Instructed on the "Calculation of Grams of Carbs to Use for Food Doses/Boluses.   B. Assigned completion of the form prior to next visit with me   C. Enter food, serving size and grams of carbs for the serving size alphabetically into a small address book.  Put address book in with meter    or carry in pocket for quick look up.  4. Instructed  Mother on how to use  the "How to Convert Your Recipe to Grams of Carbs Per Serving" form.  Enter results alphabetically into a medium to   large address book for home use.  5. He has accepted my offer to work with him to become more proficient in carb counting.  ASSESSMENT 1. While HbA1c is 7.8%, it may not accurately reflect what is actually happening with his blood sugars. The high BGs are cancelling out the low BGs giving  him a fairly decent HbA1c. 2. Most of the low BG readings are due to going without a BG check (sometime >24 hours) and/or food for extended periods of time.    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   He knows he should check his blood glucose consistently prior to meals and at   bedtime.  If not going to eat soon after rising, he needs to check BG and take correction bolus soon after rising.  Other contributing factors include: not accurately  counting carbs, guesstimating on his Correction Bolus and/or overriding the Bolus Wizards insulin dose recommendation. 3. He's grazing on high glycemic foods that are difficult, but not impossible, to determine accurate carb counts on.  While he does eat meals when Mom cooks them or   they have pizza, Scott Moran knows he should be eating more nutritionally balanced foods, but I doubt he will.  While food taste is a high priority, shopping/selecting   foods and cooking are not.  4. Scott Moran presents today with very little motivation, a lack of self confidence in himself and a fairly negative attitude regarding life and his ability to take better care   of his type 1 diabetes  and move forward in life to do what he says he wants to do.  He is introverted and insular, often preferring his own company to a group of   people.  Some of this may be his way of rebelling against diabetes, his current situation and savoring his new found freedom of "calling the shots" and doing what he   wants to do regardless of possible adverse outcomes.    And/or as his parents ap,tly put it, "He's very depressed." 5. Scott Moran is very close with his Mom and continues to rely on her to do a lot of the everyday things in life, especially relating to his diabetes.  He respects her and  stated he is most appreciative for all she has done for him and his type 1 diabetes.  However, when she challenged/chastised something he said and/or did or didn't  do, Scott Moran retreats to being nonshalant or rebuffs her comment.  PLAN: 1. Check blood sugars at least 4 times daily: before meals, bedtime and if skipping a meal check BG and do correction dose if needed;  If BG at bedtime is less than   150 mg/dl and he plans to sleep late, take a free 15-30 gram snack. 2. Complete "Calculation of grams of carbs for food dose/bolus" forms for favorite foods for all meals, snacks, desserts, beverages.  Enter alphabetically into  an address book. 3. Use Dual Wave and Square Wave Bolus 1 time prior to next appt with me. 4. Follow-up with me on Wed. 04/16/12 1:30-4:30 pm for Pump Download and Carb Counting Education.

## 2012-05-15 ENCOUNTER — Ambulatory Visit: Payer: BLUE CROSS/BLUE SHIELD | Admitting: "Endocrinology

## 2012-07-10 IMAGING — CR DG CHEST 1V PORT
1 series · 1 of 1 positions shown · non-contrast
Comparison: None.

CLINICAL DATA: Central venous catheter placement.

PORTABLE CHEST - 1 VIEW

[view not recorded]
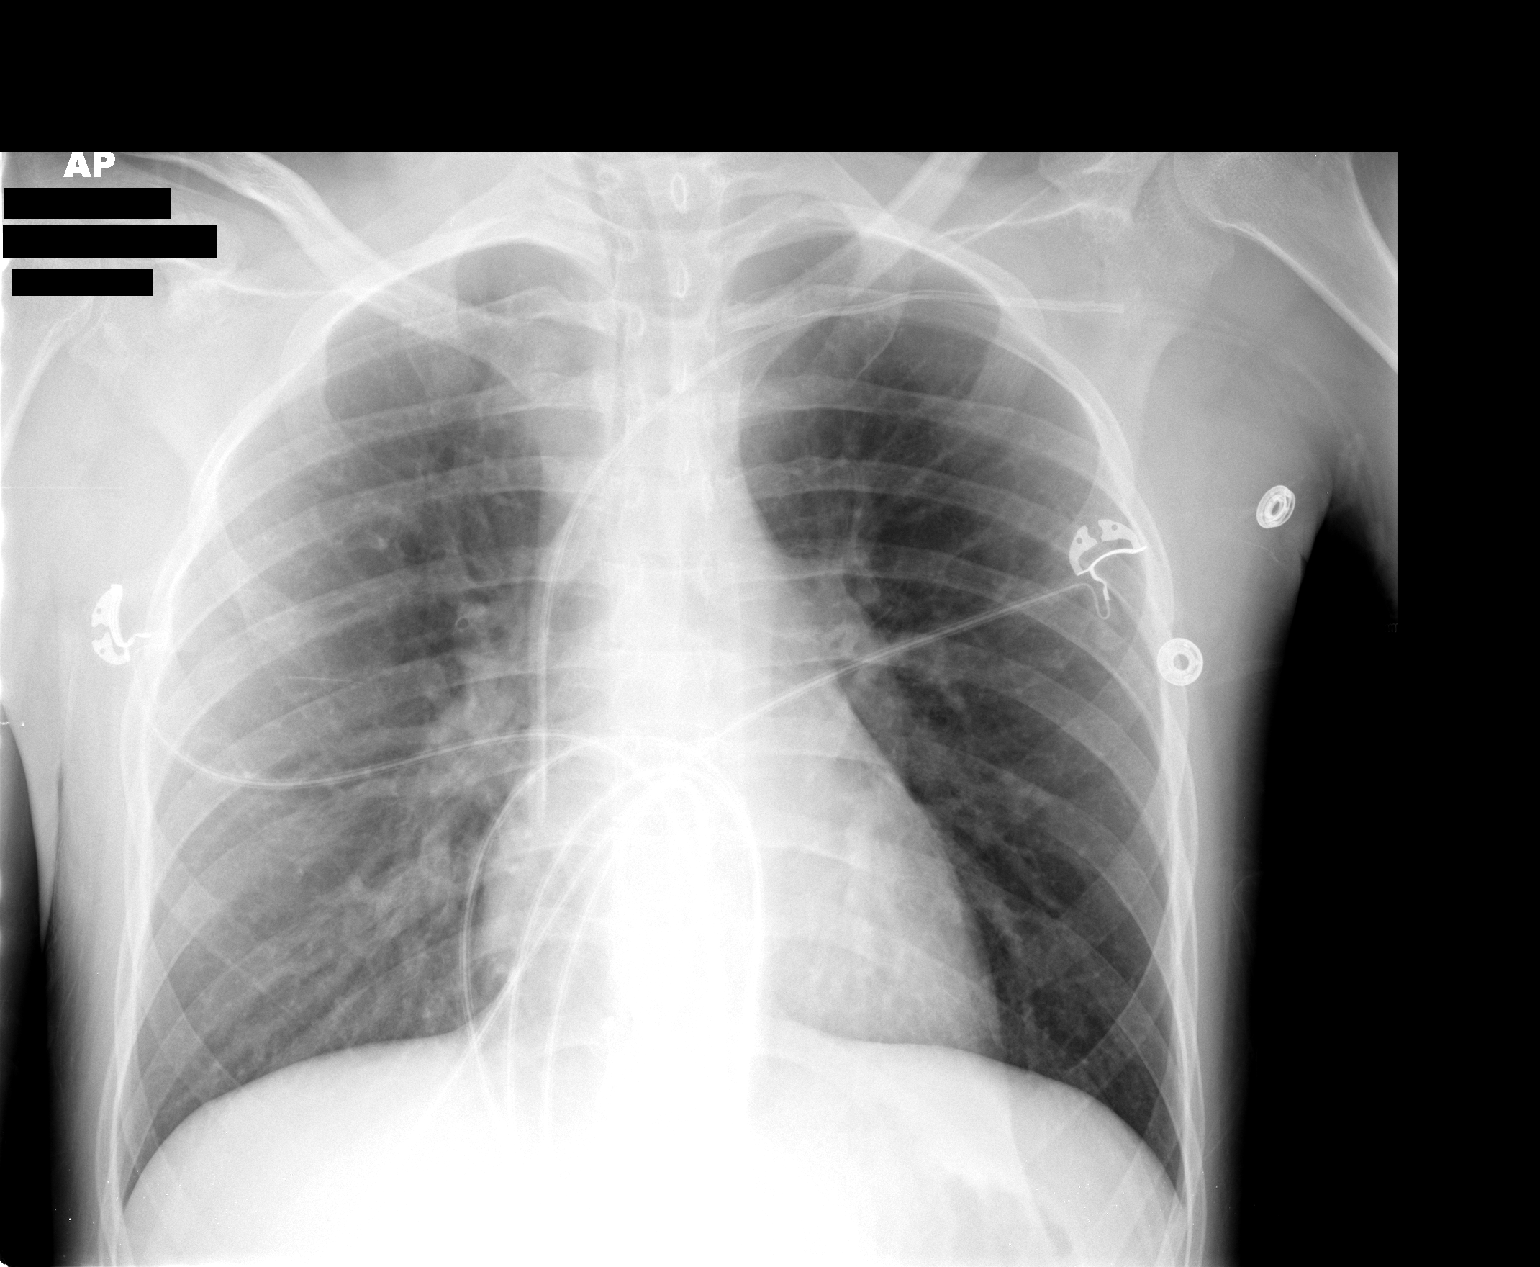

[1 of 1 positions shown; findings below may reference images not displayed]

FINDINGS: The patient's left subclavian line is noted ending at the
cavoatrial junction.

The lungs are well-aerated and clear.  There is no evidence of
focal opacification, pleural effusion or pneumothorax.

The cardiomediastinal silhouette is within normal limits.  No acute
osseous abnormalities are seen.
IMPRESSION: 1.  Left subclavian line noted ending at the cavoatrial junction,
in expected position.
2.  No acute cardiopulmonary process seen.

## 2012-08-14 ENCOUNTER — Encounter: Payer: Self-pay | Admitting: "Endocrinology

## 2012-08-14 ENCOUNTER — Ambulatory Visit (INDEPENDENT_AMBULATORY_CARE_PROVIDER_SITE_OTHER): Payer: BLUE CROSS/BLUE SHIELD | Admitting: "Endocrinology

## 2012-08-14 VITALS — BP 131/85 | HR 121 | Wt 129.5 lb

## 2012-08-14 DIAGNOSIS — E1169 Type 2 diabetes mellitus with other specified complication: Secondary | ICD-10-CM

## 2012-08-14 DIAGNOSIS — E069 Thyroiditis, unspecified: Secondary | ICD-10-CM

## 2012-08-14 DIAGNOSIS — E038 Other specified hypothyroidism: Secondary | ICD-10-CM

## 2012-08-14 DIAGNOSIS — E1149 Type 2 diabetes mellitus with other diabetic neurological complication: Secondary | ICD-10-CM

## 2012-08-14 DIAGNOSIS — I10 Essential (primary) hypertension: Secondary | ICD-10-CM

## 2012-08-14 DIAGNOSIS — E1142 Type 2 diabetes mellitus with diabetic polyneuropathy: Secondary | ICD-10-CM

## 2012-08-14 DIAGNOSIS — E049 Nontoxic goiter, unspecified: Secondary | ICD-10-CM

## 2012-08-14 DIAGNOSIS — E11649 Type 2 diabetes mellitus with hypoglycemia without coma: Secondary | ICD-10-CM

## 2012-08-14 DIAGNOSIS — R000 Tachycardia, unspecified: Secondary | ICD-10-CM

## 2012-08-14 DIAGNOSIS — G909 Disorder of the autonomic nervous system, unspecified: Secondary | ICD-10-CM

## 2012-08-14 DIAGNOSIS — E1065 Type 1 diabetes mellitus with hyperglycemia: Secondary | ICD-10-CM

## 2012-08-14 DIAGNOSIS — F329 Major depressive disorder, single episode, unspecified: Secondary | ICD-10-CM

## 2012-08-14 DIAGNOSIS — E1143 Type 2 diabetes mellitus with diabetic autonomic (poly)neuropathy: Secondary | ICD-10-CM

## 2012-08-14 DIAGNOSIS — F32A Depression, unspecified: Secondary | ICD-10-CM

## 2012-08-14 DIAGNOSIS — E063 Autoimmune thyroiditis: Secondary | ICD-10-CM

## 2012-08-14 LAB — GLUCOSE, POCT (MANUAL RESULT ENTRY): POC Glucose: 325 mg/dl — AB (ref 70–99)

## 2012-08-14 LAB — POCT GLYCOSYLATED HEMOGLOBIN (HGB A1C): Hemoglobin A1C: 7.2

## 2012-08-14 NOTE — Patient Instructions (Signed)
Follow up visit in 3 months. Please consider referring yourself to [facility] for a mental health intake and depression therapy.

## 2012-08-14 NOTE — Progress Notes (Deleted)
Ramos presents today for evaluation and follow-up on his diabetes self management and carb counting skills. He is accompanied by his mother, Waynetta Sandy, however Anguel decided he wanted to meet with me alone.  A.  BIOGRAPHICAL INFORMATION  1.  Krishi is a 21 y.o Caucasian young man with Type 1 Diabetes diagnosed at age 89.    2.  Homer is a high school graduate who had to drop out of community college due to family financial shortfalls.  3.  He currently lives at home with his parents.  4.  He is unemployed and is uninterested in seeking work.  5.  His 2 major interests are [employer] and cultural history, and learning about the outside world by means of his computer.  6.  He is an introverted young man who leads an insular life.  7.  His goals and aspirations:  Dreams of making a history a career field or being an Teacher, English as a foreign language or both.  8.  Schneur's Mother and Dr. Fransico Tahjae both feel that Ajai has a significant problem with depression.  Thus far, however, Medardo has denied that he is depressed.  B.  DIABETES CARE  1.  Zale has been seen by Dr. Fransico Morgan since 03/27/05.  At that time, Jersey was already on a Medtronic Insulin Pump.    2.  Daytron has recently upgraded to a Medtronic723 insulin pump and now has a continuous glucose monitor.  3.  Lacey has been and still is still fairly noncompliant with checking blood sugars, bolusing, and changing his insulin pump sites on time, but he is somewhat        more compliant and careful than he used to be.   4.  His most recent HbA1c was 7.8 % on 02/13/12  C.  LEARNING ISSUES:  STRENGTHS AND BARRIERS  1.  Adrain is a very intelligent and caring person.  2.  He is somewhat like an absent minded professor in that he becomes engrossed in the things he is interested in and passion about, and ignores       real life problems such as diabetes self-care, blood glucose control and long term complications associated with diabetes.  3.  Although Jaykon is  intelligent enough to understand what he needs to do to control/manage his diabetes, he frequently doesn't do what he needs to do.  D.  DIABETES LEARNING TODAY  1.  Reviewed reading food labels.  2.  Discussed how to identify and subtract insoluble fiber from total carbohydrates on a food label.  3.  Discussed portion visualization and portion control.  4.  Discussed and practiced pouring cereal in bowl & measuring it back into box to determine total grams of carbs for food dose.  Log in address book.  5.  Discussed the importance of using a small address book to log serving sizes of favorite foods and total grams of carbs for food boluses.   6.  Markee prefers to graze on regular Lays potato chips instead of eating full meals, but just guesses on the carb count.  We discussed an easy way to       do portion control for carb counting:  13-15 potato chips = 1 oz.;  Per Calorie King Carb Book, 1 oz. of regular potato chips = 15 grams of carbs.;       Fill sandwich bags with 15 chips each and store them for future snacking; eat 1 bag at a time; cover for 15 g of carbs for each.  7.  Downloaded Insulin Pump Reports identified he's averaging 2.6 blood glucose checks daily, 2.3 boluses daily most for food and correction.  28% of Bolus        Wizard recommended insulin doses were overridden.  Per [name], when his blood glucose is high, usually between 4-7 pm, he adds about 0.5 units to the       Bolus Wizard insulin dose recommendation.  At that time he usually feels physically hot, has a very dry mouth and is thirsty.  8.  Compared to 03/10/12 Pump Reports, he is checking blood glucose about the same number of times daily; doing fewer food boluses, but more correction       boluses and bolus wizard overrides. Lacorey continues to go 6-7 days between site changes.  ASSESSMENT: 1.  Elray has some unique personal challenges that affect learning.  2.  He is intelligent enough to take good care of his  diabetes, but at present is not motivated to do so. 3.  He does have some learning deficits and by working on these deficits we may also work with him to improve his motivation.    PLAN: 1.  Follow-up with Dr. Fransico Jamarius in June as planned. 2.  I have given Tejas the option of meeting with me more frequently for diabetes education, counseling and support.  He will think about it and let me know.

## 2012-08-14 NOTE — Progress Notes (Signed)
Subjective:  Patient Name: Scott Moran Date of Birth: [birth year]  MRN: [id number]  Scott Moran  presents to the office today for follow-up of his type 1 diabetes mellitus, goiter, hypoglycemia, depression, hypothyroidism, thyroiditis, diabetic autonomic neuropathy, tachycardia, nonproliferative diabetic retinopathy, diabetic peripheral neuropathy, fatigue, and hypogonadism.  HISTORY OF PRESENT ILLNESS:   Scott Moran is a 21 y.o. Caucasian young man. Scott Moran was unaccompanied.  1. I first saw the patient in consultation on 03/27/05 in the Out-patient [facility] of [facility] in [place], [place]. He had been referred by his primary care provider, Dr. Egbert Garibaldi , [place], [facility], for evaluation and treatment of poorly controlled type 1 diabetes. The patient was 21 years old.  A. The patient had been diagnosed with type 1 diabetes in February of 1995 at age 3. He was on an insulin pump at that time I first met him. His hemoglobin A1c had increased from 7.1% in December of 2005 to 8.2% in April 2006. He was having frequent snacks that he was not taking boluses for. He was also frequently noncompliant with checking blood sugars and taking meal boluses. I adjusted his basal rates.  B. At his next [facility] visit on 06/19/05, he was still very noncompliant. Mental status was normal. He had 25 g goiter. His father was serving in [place] with the [employer]. There was a lot of stress at home. I again adjusted his basal rates.  C. At the end of July 2006 we closed our satellite office in [place]. The patient elected to have me follow him at our clinic here in [place]. I have followed him here ever since. 2. During the past seven years, the patient has had several clinical issues.   A. T1DM: The patient's blood glucose control has been fairly good for a teenager. His maximum hemoglobin A1c occurred on 04/16/06 when the hemoglobin A1c was 12.1%. Since then the hemoglobin A1c values have  varied between 6.9% and 8.4%. He is still fairly noncompliant with checking blood sugars, bolusing, and changing his insulin pump sites on time, but he is more compliant and more careful than he used to be.   B. Goiter, Hashimoto's thyroiditis, and hypothyroidism: In late 2008 and early 2009 the patient developed hypothyroidism secondary to Hashimoto's disease. I started him on Synthroid, 25 mcg per day. When he takes his medication, he is euthyroid.  C. Autonomic neuropathy and tachycardia: In late 2008 he developed autonomic neuropathy which manifested as tachycardia.These problems vary in parallel with his BGs and HbA1c's.  D. Diabetic peripheral neuropathy: The patient has had symptoms and signs of mild diabetic peripheral neuropathy at several times in the past. His symptoms similarly vary in parallel with his BGs and HbA1c's.   E. Non-proliferative diabetic retinopathy: This problem was identified during a diabetic eye exam in June 2010.  F. Hypogonadotropic hypogonadism: The patient has always been somewhat lackadaisical and relatively passive. On 06/29/10 his FSH was 4.9, LH was 2.4, testosterone was 399.66, and free testosterone was 79.8. The testosterone and free testosterone were relatively low for an 21 year old young man. At the same time the [facility] and LH while "normal", appeared to be somewhat low for the level of testosterone he had. We discussed the options for treatment including exercise and medication, to include AndroGel. The patient chose the exercise option. By 10/19/10 the [facility] was 6.8, the LH was 3.2, total testosterone was 615.88 and the free testosterone was 131.6. It may be that when he was more depressed he had less  gonadotropic function. This hypogonadotropic hypogonadism may have resolved. We will continue to follow up on this issue over time.  G. The patient has been diagnosed with depression at different times in the past. For some time he took Remeron (mirtazapine), but  discontinued this in early 2012. I have been trying to convince him for some time that he is depressed and that he should seek out help. Thus far, however, he has not been willing to admit that he was depressed or that he needed help.  3. The patient's last [facility] visit was on 02/13/12.  In the interim, he missed his June appointment due to his grandmother being very sick. He ran out of Synthroid and lisinopril, but did not try to get them refilled, because he did not see the need to obtain more medication. He sometimes gives insulin boluses at meals, sometimes not, but is doing better. He is still not very good about checking blood sugars, but has been a little better recently. He is "pretty lazy" He spends most of the time just sitting around at home and playing on the computer. 4. Scott Moran began today's visit by thanking Scott Moran and me for continuing to take care of him even though he's been a very difficult patient. He stated that he didn't know what he would have done if we had not been there for him over and over again. He said that he was very, very grateful to [place]. I responded that we have never given up on him. We've always seen him as a valuable person, a good person, and a person that has a lot to contribute to the world. As long as we are in practice, we will always be there for him. 5.Pertinent Review of Systems:  Constitutional: The patient feels "pretty okay". He denies using any drugs. He still stays up very late into the night and then sleeps much of the day. He has no significant complaints. He did have some ragweed allergy problems for a few days several weeks ago.  Eyes: Vision is good with glasses. There are no significant eye complaints. Last eye exam was November 2011. Neck: The patient has no complaints of anterior neck swelling, soreness, tenderness,  pressure, discomfort, or difficulty swallowing.  Heart: Heart rate sometimes feels fast. Heart rate increases with exercise or other physical  activity. The patient has no complaints of palpitations, irregular heat beats, chest pain, or chest pressure. Gastrointestinal: Bowel movents seem normal. The patient has no complaints of excessive hunger, acid reflux, upset stomach, stomach aches or pains, diarrhea, or constipation. Legs: Muscle mass and strength seem normal. There are no complaints of numbness, tingling, burning, or pain. No edema is noted. Feet: There are no obvious foot problems. There are no complaints of numbness, tingling, burning, or pain. No edema is noted. Hypoglycemia: He does not have low BGs too often, but can have some upon awakening in the early-mid afternoon.   Emotional/psychological: The patient states he is "probably depressed to an extent". He says that he does not want to continue to feel the way he does now. He says that he probably wants help, but wants to "think on it some more". 6. BG printout: The patient is checking his blood sugars anywhere from 0-5 times daily. He averages 2-3 BG checks per day. BGs are occasionally low in the late afternoon when he sleeps in very late. He sometimes goes 36 hours between checking his BGs or giving boluses. On other days he may bolus 4-5 times.  He is much better about bolusing than he is about checking BGs.     PAST MEDICAL, FAMILY, AND SOCIAL HISTORY:  Past Medical History  Diagnosis Date  . Type 1 diabetes mellitus not at goal   . Hypoglycemia associated with diabetes   . Goiter   . Thyroiditis, autoimmune   . Hypothyroidism, acquired, autoimmune   . Type 1 diabetes mellitus with diabetic autonomic neuropathy   . Tachycardia   . DM type 1 with diabetic peripheral neuropathy   . Hypertension   . Hypogonadotropic hypogonadism     Family History  Problem Relation Age of Onset  . Diabetes Maternal Uncle   . Hypertension Maternal Grandmother   . Thyroid disease Neg Hx   . Obesity Neg Hx   . Kidney disease Neg Hx   . Cancer Neg Hx   . Anemia Neg Hx      Current outpatient prescriptions:glucagon (GLUCAGON EMERGENCY) 1 MG injection, Inject 1 mg intramuscularly in thigh 1 time for severe hypoglycemia if unresponsive, unconscious, unable to swallow or has a seizure., Disp: 2 kit, Rfl: 3;  glucose blood (BAYER CONTOUR NEXT TEST) test strip, Use as instructed to check your blood sugar up to 6x daily. Please provide #90 day supply., Disp: 612 each, Rfl: 1 insulin aspart (NOVOLOG) 100 UNIT/ML injection, Use as directed by physician for back up if insulin pump fails., Disp: , Rfl: ;  insulin aspart (NOVOLOG) 100 UNIT/ML injection, Novolog aspart, 12 vials for 90 days, 300 units in insulin pump every 48 - 72 hours and per Protocols for Hyperglycemia & DKA Outpatient Treatment.  1 Refill, Disp: , Rfl:  insulin glargine (LANTUS) 100 UNIT/ML injection, Use as directed by physician for back up if insulin pump fails., Disp: , Rfl: ;  Insulin Pen Needle 31G X 5 MM MISC, BD Pen Needles- brand specific Inject insulin via insulin pen 7 x daily, Disp: 250 each, Rfl: 3;  levothyroxine (SYNTHROID) 25 MCG tablet, Take 1 tablet (25 mcg total) by mouth daily., Disp: 30 tablet, Rfl: 6;  Loratadine (CLARITIN PO), Take by mouth.  , Disp: , Rfl:  lisinopril (PRINIVIL,ZESTRIL) 5 MG tablet, Take 1 tablet (5 mg total) by mouth daily., Disp: 30 tablet, Rfl: 11  Allergies as of 08/14/2012  . (No Known Allergies)    1. Work and Family: Patient is no longer attending junior college, in part due to family financial problems. Father is working, but does not receive a very Retail [employer]. Dad has no health insurance. Scott Moran and mom do have relatively poor health insurance. The patient is not working and is not interested in looking for work. 2. Activities: Few physical activities. 3. Smoking, alcohol, or drugs: None 4. Primary Care Provider: He no longer has a primary care provider.  ROS: There are no other significant problems involving Scott Moran's other body systems.   Objective:   Vital Signs:  BP 131/85  Pulse 121  Wt 129 lb 8 oz (58.741 kg)   Ht Readings from Last 3 Encounters:  No data found for Ht   Wt Readings from Last 3 Encounters:  08/14/12 129 lb 8 oz (58.741 kg)  04/16/12 128 lb 9.6 oz (58.333 kg)  03/10/12 126 lb 3.2 oz (57.244 kg)   There is no height on file to calculate BSA.  PHYSICAL EXAM:  Constitutional: Scott Moran was very bright, alert, and upbeat today. This was the most mature I've ever seen him. For the first time he was willing to state that he is probably  depressed and that he does not want to continue to live as he has been doing. He appears healthy and well nourished.  Face: The face appears normal.  Eyes: There is no obvious arcus or proptosis. Moisture appears normal. Mouth: The oropharynx and tongue appear normal. Oral moisture is normal. Neck: The neck appears to be visibly normal. No carotid bruits are noted. The thyroid gland is 20+ grams in size. Right lobe is at the upper limit of normal for size. The left lobe is just slightly enlarged. The consistency of the thyroid gland is slightly firm. The thyroid gland is not tender to palpation. Lungs: The lungs are clear to auscultation. Air movement is good. Heart: Heart rate and rhythm are regular. Heart sounds S1 and S2 are normal. I did not appreciate any pathologic cardiac murmurs. Abdomen: The abdomen is normal in size. Bowel sounds are normal. There is no obvious hepatomegaly, splenomegaly, or other mass effect.  Arms: Muscle size and bulk are normal for age. Hands: There is no obvious tremor. Phalangeal and metacarpophalangeal joints are normal. Palmar muscles are normal. Palmar skin is normal. Palmar moisture is also normal. Legs: Muscles appear normal for age. No edema is present. Feet: Feet are normally formed. Dorsalis pedal pulses are normal 2+ bilaterally. Neurologic: Strength is normal for age in both the upper and lower extremities. Muscle tone is normal. Sensation to  touch is normal in both legs, but decreased in right heel and left ball.    LAB DATA: Hemoglobin A1c is 7.2%, compared with 7.8% at last visit.Marland Kitchen   Recent Results (from the past 504 hour(s))  GLUCOSE, POCT (MANUAL RESULT ENTRY)   Collection Time   08/14/12  1:14 PM      Component Value Range   POC Glucose 325 (*) 70 - 99 mg/dl  POCT GLYCOSYLATED HEMOGLOBIN (HGB A1C)   Collection Time   08/14/12  1:14 PM      Component Value Range   Hemoglobin A1C 7.2     Labs: 04/15/12: TSH 3.344, free T4  1.11, free T3 3.2   Assessment and Plan:   ASSESSMENT:  1. Type 1 diabetes mellitus: The patient has a good hemoglobin A1c today, but is still having a great deal of variability in blood glucose levels. He is erratic in BG testing and bolusing, which definitely contributes to his variability.  2. Hypoglycemia: He is having more frequent low BGs than he has documented or recognized. Most of his lower blood sugars occur around the time he wakes up after sleeping in late. He frequently forgets to use the temporary basal rate function when he plans to sleep in late.  3. Autonomic neuropathy and tachycardia: Patient still has these problems because he still has a lot of higher blood sugars. 4. Peripheral neuropathy: Patient still has this problem because he still has a lot of higher blood sugars. 5. Hypothyroid: He has not been taking his Synthroid regularly. He is probably hypothyroid now. His hypothyroidism contributes to his depression.  He needs to take his Synthroid on a regular basis. 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   7. Hypertension: Blood pressure is too high. He needs to resume his lisinopril daily. 8. Hashimoto's disease: Thyroiditis is clinically quiescent. 9. Depression: He is willing to admit that he has depression. I've asked him to refer himself to [facility]. He will consider doing so.  10. Noncompliance: He still does what he wants  when he wants to do it, but is doing  somewhat better. 11. Weight loss: He has regained some weight, c/w taking more insulin.   PLAN:  1. Diagnostic: TFTs at next visit. 2. Therapeutic: Resume Synthroid and lisinopril. 3. Patient education: We talked bout his BG control. He knows what he should do and knows how to do it, but can't make himself consistently follow his DM care plan. We also talked about his depression. For the first time he admits that he needs help.  4. Follow-up: 3 months  Level of Service: This visit lasted in excess of 40 minutes. More than 50% of the visit was devoted to counseling.  David Stall, MD 08/14/2012 1:37 PM

## 2012-11-12 ENCOUNTER — Other Ambulatory Visit: Payer: Self-pay | Admitting: *Deleted

## 2012-11-12 DIAGNOSIS — E1065 Type 1 diabetes mellitus with hyperglycemia: Secondary | ICD-10-CM

## 2012-11-14 LAB — COMPREHENSIVE METABOLIC PANEL
ALT: 10 U/L (ref 0–53)
AST: 12 U/L (ref 0–37)
Albumin: 4.7 g/dL (ref 3.5–5.2)
Alkaline Phosphatase: 50 U/L (ref 39–117)
BUN: 9 mg/dL (ref 6–23)
Chloride: 103 mEq/L (ref 96–112)
Potassium: 4.4 mEq/L (ref 3.5–5.3)
Sodium: 140 mEq/L (ref 135–145)
Total Protein: 7.6 g/dL (ref 6.0–8.3)

## 2012-11-14 LAB — LIPID PANEL
HDL: 58 mg/dL (ref >39)
LDL Cholesterol: 71 mg/dL (ref 0–99)

## 2012-11-14 LAB — HEMOGLOBIN A1C: Mean Plasma Glucose: 197 mg/dL — ABNORMAL HIGH (ref <117)

## 2012-11-14 LAB — T3, FREE: T3, Free: 3.6 pg/mL (ref 2.3–4.2)

## 2012-11-15 LAB — MICROALBUMIN / CREATININE URINE RATIO
Creatinine, Urine: 265.1 mg/dL
Microalb, Ur: 2.13 mg/dL — ABNORMAL HIGH (ref 0.00–1.89)

## 2012-11-17 ENCOUNTER — Ambulatory Visit (INDEPENDENT_AMBULATORY_CARE_PROVIDER_SITE_OTHER): Payer: BC Managed Care – PPO | Admitting: "Endocrinology

## 2012-11-17 VITALS — BP 130/89 | HR 104 | Wt 128.0 lb

## 2012-11-17 DIAGNOSIS — F32A Depression, unspecified: Secondary | ICD-10-CM

## 2012-11-17 DIAGNOSIS — E049 Nontoxic goiter, unspecified: Secondary | ICD-10-CM

## 2012-11-17 DIAGNOSIS — R634 Abnormal weight loss: Secondary | ICD-10-CM

## 2012-11-17 DIAGNOSIS — E11649 Type 2 diabetes mellitus with hypoglycemia without coma: Secondary | ICD-10-CM

## 2012-11-17 DIAGNOSIS — E1149 Type 2 diabetes mellitus with other diabetic neurological complication: Secondary | ICD-10-CM

## 2012-11-17 DIAGNOSIS — E038 Other specified hypothyroidism: Secondary | ICD-10-CM

## 2012-11-17 DIAGNOSIS — F3289 Other specified depressive episodes: Secondary | ICD-10-CM

## 2012-11-17 DIAGNOSIS — E1169 Type 2 diabetes mellitus with other specified complication: Secondary | ICD-10-CM

## 2012-11-17 DIAGNOSIS — E063 Autoimmune thyroiditis: Secondary | ICD-10-CM

## 2012-11-17 DIAGNOSIS — IMO0002 Reserved for concepts with insufficient information to code with codable children: Secondary | ICD-10-CM

## 2012-11-17 DIAGNOSIS — E1143 Type 2 diabetes mellitus with diabetic autonomic (poly)neuropathy: Secondary | ICD-10-CM

## 2012-11-17 DIAGNOSIS — G909 Disorder of the autonomic nervous system, unspecified: Secondary | ICD-10-CM

## 2012-11-17 DIAGNOSIS — E1065 Type 1 diabetes mellitus with hyperglycemia: Secondary | ICD-10-CM

## 2012-11-17 DIAGNOSIS — I10 Essential (primary) hypertension: Secondary | ICD-10-CM

## 2012-11-17 DIAGNOSIS — F329 Major depressive disorder, single episode, unspecified: Secondary | ICD-10-CM

## 2012-11-17 DIAGNOSIS — E1142 Type 2 diabetes mellitus with diabetic polyneuropathy: Secondary | ICD-10-CM

## 2012-11-17 LAB — GLUCOSE, POCT (MANUAL RESULT ENTRY): POC Glucose: 435 mg/dl — AB (ref 70–99)

## 2012-11-17 MED ORDER — GLUCOSE BLOOD VI STRP: ORAL_STRIP | Status: DC

## 2012-11-17 MED ORDER — LISINOPRIL 10 MG PO TABS: 10.0000 mg | ORAL_TABLET | Freq: Every day | ORAL | Status: DC

## 2012-11-17 NOTE — Progress Notes (Signed)
Subjective:  Patient Name: Scott Moran Date of Birth: 1991/06/17  MRN: [id number]  Scott Moran  presents to the office today for follow-up of his type 1 diabetes mellitus, goiter, hypoglycemia, depression, hypothyroidism, thyroiditis, diabetic autonomic neuropathy, tachycardia, nonproliferative diabetic retinopathy, diabetic peripheral neuropathy, fatigue, and hypogonadism.  HISTORY OF PRESENT ILLNESS:   Scott Moran is a 21 y.o. Caucasian young man. Scott Moran was unaccompanied for most of the visit. At the end he allowed his mother to sit in and allowed me to update his mother on his condition..  1. I first saw the patient in consultation on 03/27/05 in the Out-patient Clinic of [facility] in [place], Scott Moran. He had been referred by his primary care provider, Dr. Egbert Garibaldi , [place], [facility], for evaluation and treatment of poorly controlled type 1 diabetes. The patient was 21 years old.  A. The patient had been diagnosed with type 1 diabetes in February of 1995 at age 21. He was on an insulin pump at that time I first met him. His hemoglobin A1c had increased from 7.1% in December of 2005 to 8.2% in April 2006. He was having frequent snacks that he was not taking boluses for. He was also frequently noncompliant with checking blood sugars and taking meal boluses. I adjusted his basal rates.  B. At his next [place] visit on 06/19/05, he was still very noncompliant. Mental status was normal. He had 25 g goiter. His father was serving in [place] with the [employer]. There was a lot of stress at home. I again adjusted his basal rates.  C. At the end of July 2006 we closed our satellite office in [place]. The patient elected to have me follow him at our clinic here in [place]. I have followed him here ever since. 2. During the past seven years, the patient has had several clinical issues.   A. T1DM: The patient's blood glucose control has been fairly good for a teenager. His  maximum hemoglobin A1c occurred on 04/16/06 when the hemoglobin A1c was 12.1%. Since then the hemoglobin A1c values have varied between 6.9% and 8.4%. He is still fairly noncompliant with checking blood sugars, bolusing, and changing his insulin pump sites on time, but he is more compliant and more careful than he used to be.   B. Goiter, Hashimoto's thyroiditis, and hypothyroidism: In late 2008 and early 2009 the patient developed hypothyroidism secondary to Hashimoto's disease. I started him on Synthroid, 25 mcg per day. When he takes his medication, he is euthyroid.  C. Autonomic neuropathy and tachycardia: In late 2008 he developed autonomic neuropathy which manifested as tachycardia.These problems vary in parallel with his BGs and HbA1c values.  D. Diabetic peripheral neuropathy: The patient has had symptoms and signs of mild diabetic peripheral neuropathy at several times in the past. His symptoms similarly vary in parallel with his BGs and HbA1c values.   E. Non-proliferative diabetic retinopathy: This problem was identified during a diabetic eye exam in June 2010.  F. Hypogonadotropic hypogonadism: The patient has always been somewhat lackadaisical and relatively passive. On 06/29/10 his FSH was 4.9, LH was 2.4, testosterone was 399.66, and free testosterone was 79.8. The testosterone and free testosterone were relatively low for an 21 year old young man. At the same time the [facility] and LH while "normal", appeared to be somewhat low for the level of testosterone he had. We discussed the options for treatment including exercise and medication, to include AndroGel. The patient chose the exercise option. By 10/19/10 the [facility] was  6.8, the LH was 3.2, total testosterone was 615.88 and the free testosterone was 131.6. It may be that when he was more depressed he had less gonadotropic function. We will continue to follow up on this issue over time.  G. The patient has been diagnosed with depression at different  times in the past. For some time he took Remeron (mirtazapine), but discontinued this in early 2012. I have been trying to convince him for some time that he is depressed and that he should seek out help. Thus far, however, he has not usually been willing to admit that he was depressed or that he needed help.  3. The patient's last [facility] visit was on 08/14/12. In the interim, not much has changed. He goes to bed a little earlier and wakes up a little earlier in the morning. He takes his Synthroid and lisinopril at night just before he goes to bed.  "I am not checking blood sugars or taking boluses as often as I should". He is "pretty lazy". He spends most of the time just sitting around at home, surfing the web, and visiting in chat rooms. His Synthroid dose is 25 mcg/day and his lisinopril dose is 5 mg.  4. Pertinent Review of Systems:  Constitutional: The patient feels "pretty okay". He denies using any drugs. He has no significant complaints. His allergies have been quiescent.  His mood seems normal. Eyes: Vision is good with glasses. There are no significant eye complaints. Last eye exam was a few months ago. There were no signs of diabetic eye disease.  Neck: The patient has no complaints of anterior neck swelling, soreness, tenderness,  pressure, discomfort, or difficulty swallowing.  Heart: Heart rate sometimes feels fast. Heart rate increases with exercise or other physical activity. The patient has no complaints of palpitations, irregular heat beats, chest pain, or chest pressure. Gastrointestinal: Bowel movents seem normal. The patient has no complaints of excessive hunger, acid reflux, upset stomach, stomach aches or pains, diarrhea, or constipation. Legs: Muscle mass and strength seem normal. There are no complaints of numbness, tingling, burning, or pain. No edema is noted. Feet: There are no obvious foot problems. He had some numbness and tingling of the dorsa of his feet after sitting  cross-legged about 1-2 weeks ago. There are no other complaints of numbness, tingling, burning, or pain. No edema is noted. Hypoglycemia: He does not have low BGs too often. He had a 97 this AM.    Emotional/psychological: The patient states he is "probably depressed". He says that he probably wants help, but is not sure if he will have enough insurance coverage to pay for psych care.  6. BG printout: The patient is checking his blood sugars anywhere from 0-5 times daily. He averages 1.7 BG checks per day. BGs vary from 67- >400, averaging 245. BGs are occasionally low in the evening if he has not checked his BG for at least 12 hours. He sometimes goes 44-48 hours between BG checks and 72 hours between boluses. On other days he may bolus 3-6 times. He is better about bolusing than he is about checking BGs.     PAST MEDICAL, FAMILY, AND SOCIAL HISTORY:  Past Medical History  Diagnosis Date  . Type 1 diabetes mellitus not at goal   . Hypoglycemia associated with diabetes   . Goiter   . Thyroiditis, autoimmune   . Hypothyroidism, acquired, autoimmune   . Type 1 diabetes mellitus with diabetic autonomic neuropathy   . Tachycardia   .  DM type 1 with diabetic peripheral neuropathy   . Hypertension   . Hypogonadotropic hypogonadism     Family History  Problem Relation Age of Onset  . Diabetes Maternal Uncle   . Hypertension Maternal Grandmother   . Thyroid disease Neg Hx   . Obesity Neg Hx   . Kidney disease Neg Hx   . Cancer Neg Hx   . Anemia Neg Hx     Current outpatient prescriptions:glucagon (GLUCAGON EMERGENCY) 1 MG injection, Inject 1 mg intramuscularly in thigh 1 time for severe hypoglycemia if unresponsive, unconscious, unable to swallow or has a seizure., Disp: 2 kit, Rfl: 3;  glucose blood (BAYER CONTOUR NEXT TEST) test strip, Use as instructed to check your blood sugar up to 6x daily. Please provide #90 day supply., Disp: 612 each, Rfl: 1 insulin aspart (NOVOLOG) 100 UNIT/ML  injection, Use as directed by physician for back up if insulin pump fails. , Disp: , Rfl: ;  insulin aspart (NOVOLOG) 100 UNIT/ML injection, Novolog aspart, 12 vials for 90 days, 300 units in insulin pump every 48 - 72 hours and per Protocols for Hyperglycemia & DKA Outpatient Treatment.  1 Refill , Disp: , Rfl:  insulin glargine (LANTUS) 100 UNIT/ML injection, Use as directed by physician for back up if insulin pump fails. , Disp: , Rfl: ;  Insulin Pen Needle 31G X 5 MM MISC, BD Pen Needles- brand specific Inject insulin via insulin pen 7 x daily, Disp: 250 each, Rfl: 3;  Loratadine (CLARITIN PO), Take 25 mcg by mouth as needed. , Disp: , Rfl: ;  levothyroxine (SYNTHROID) 25 MCG tablet, Take 1 tablet (25 mcg total) by mouth daily., Disp: 30 tablet, Rfl: 6 lisinopril (PRINIVIL,ZESTRIL) 5 MG tablet, Take 1 tablet (5 mg total) by mouth daily., Disp: 30 tablet, Rfl: 11  Allergies as of 11/17/2012  . (No Known Allergies)    1. Work and Family: Patient is no longer attending junior college, in part due to family financial problems. Father is working, but does not receive a very [employer]. He has no health insurance benefits. Dad's job may or may not continue. The family's COBRA insurance ends soon. The patient is not working and is not interested in looking for work. 2. Activities: Few physical activities. 3. Smoking, alcohol, or drugs: None 4. Primary Care Provider: He no longer has a primary care provider.  ROS: There are no other significant problems involving Bilal's other body systems.   Objective:  Vital Signs:  BP 130/89  Pulse 104  Wt 128 lb (58.06 kg) He has lost 1.5 lbs since last visit.    Ht Readings from Last 3 Encounters:  No data found for Ht   Wt Readings from Last 3 Encounters:  11/17/12 128 lb (58.06 kg)  08/14/12 129 lb 8 oz (58.741 kg)  04/16/12 128 lb 9.6 oz (58.333 kg)   PHYSICAL EXAM:  Constitutional: Camillo was again very bright and alert today. He is concerned,  however, about the family's insurance status, about whether his father's job will continue, and about whether the family will have to move back to [place]. He appears healthy, but slimmer.  Face: The face appears normal.  Eyes: There is no obvious arcus or proptosis. Moisture appears normal. Mouth: The oropharynx and tongue appear normal. Oral moisture is normal. Neck: The neck appears to be visibly normal. No carotid bruits are noted. The thyroid gland is 20+ grams in size. Both lobes are mildly enlarged. The consistency of the thyroid gland  is slightly firm. The thyroid gland is not tender to palpation. Lungs: The lungs are clear to auscultation. Air movement is good. Heart: Heart rate and rhythm are regular. Heart sounds S1 and S2 are normal. I did not appreciate any pathologic cardiac murmurs. Abdomen: The abdomen is normal in size. Bowel sounds are normal. There is no obvious hepatomegaly, splenomegaly, or other mass effect.  Arms: Muscle size and bulk are normal for age. Hands: There is no obvious tremor. Phalangeal and metacarpophalangeal joints are normal. Palmar muscles are normal. Palmar skin is normal. Palmar moisture is also normal. Legs: Muscles appear normal for age. No edema is present. Feet: Feet are normally formed. Dorsalis pedal pulses are 1+ on the right foot and faint 1+ on the left foot.  Neurologic: Strength is normal for age in both the upper and lower extremities. Muscle tone is normal. Sensation to touch is normal in both legs, but decreased in both heels.    LAB DATA: Hemoglobin A1c was 8.5% on 11/14/12, compared with 7.2% at last visit, and with 7.8% at the prior visit.Marland Kitchen   Recent Results (from the past 504 hour(s))  COMPREHENSIVE METABOLIC PANEL   Collection Time   11/12/12  4:55 PM      Component Value Range   Sodium 140  135 - 145 mEq/L   Potassium 4.4  3.5 - 5.3 mEq/L   Chloride 103  96 - 112 mEq/L   CO2 30  19 - 32 mEq/L   Glucose, Bld 86  70 - 99 mg/dL    BUN 9  6 - 23 mg/dL   Creat 9.14  7.82 - 9.56 mg/dL   Total Bilirubin 0.8  0.3 - 1.2 mg/dL   Alkaline Phosphatase 50  39 - 117 U/L   AST 12  0 - 37 U/L   ALT 10  0 - 53 U/L   Total Protein 7.6  6.0 - 8.3 g/dL   Albumin 4.7  3.5 - 5.2 g/dL   Calcium 21.3  8.4 - 08.6 mg/dL  LIPID PANEL   Collection Time   11/12/12  4:55 PM      Component Value Range   Cholesterol 136  0 - 200 mg/dL   Triglycerides 35  <578 mg/dL   HDL 58  >46 mg/dL   Total CHOL/HDL Ratio 2.3     VLDL 7  0 - 40 mg/dL   LDL Cholesterol 71  0 - 99 mg/dL  MICROALBUMIN / CREATININE URINE RATIO   Collection Time   11/12/12  4:55 PM      Component Value Range   Microalb, Ur 2.13 (*) 0.00 - 1.89 mg/dL   Creatinine, Urine 962.9     Microalb Creat Ratio 8.0  0.0 - 30.0 mg/g  T3, FREE   Collection Time   11/12/12  4:55 PM      Component Value Range   T3, Free 3.6  2.3 - 4.2 pg/mL  T4, FREE   Collection Time   11/12/12  4:55 PM      Component Value Range   Free T4 1.41  0.80 - 1.80 ng/dL  TSH   Collection Time   11/12/12  4:55 PM      Component Value Range   TSH 1.464  0.350 - 4.500 uIU/mL  HEMOGLOBIN A1C   Collection Time   11/12/12  4:55 PM      Component Value Range   Hemoglobin A1C 8.5 (*) <5.7 %   Mean Plasma Glucose 197 (*) <117 mg/dL  GLUCOSE, POCT (MANUAL RESULT ENTRY)   Collection Time   11/17/12  3:51 PM      Component Value Range   POC Glucose 435 (*) 70 - 99 mg/dl     Assessment and Plan:   ASSESSMENT:  1. Type 1 diabetes mellitus: The patient's BG control has deteriorated. He is still having a great deal of variability in blood glucose levels. He is erratic in BG testing and bolusing, which definitely contributes to his variability. Thus far his serum creatinine and urinary microalbumin/creatinine ratio are good. His peripheral neuropathy is mild and reversible. His autonomic neuropathy is more severe, but also reversible. He reportedly does not show any signs of diabetic eye disease. The  good news is that his organs are doing well now. The bad news is that those same organs will suffer increasingly more damage if he does not take better care of his DM.  2. Hypoglycemia: He is having fewer low BGs now. Most of his lower BGs and higher BGs occur when he has not checked his BGs for at least 12 hours. 3. Autonomic neuropathy and tachycardia: Patient still has these problems because he has a lot of higher blood sugars. 4. Peripheral neuropathy: Patient still has this problem because he has a lot of higher blood sugars. 5. Hypothyroid: He is euthyroid now. He has been taking his Synthroid regularly.  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   7. Hypertension: Blood pressure is still too high, despite resuming lisinopril. He needs more. 8. Hashimoto's disease: Thyroiditis is clinically quiescent. 9. Depression: He seems less depressed today. He is not interested in beginning a psych evaluation if he will not have insurance coverage or will not be staying in the area.   10. Noncompliance: He still does what he wants when he wants to do it. 11. Weight loss: He has lost more weight, c/w inadequate insulinization.   PLAN:  1. Diagnostic: None today. 2. Therapeutic: Continue Synthroid at the current dose. Increase lisinopril to 10 mg/day.  I sent in the new scrip to his pharmacy.  3. Patient education: We talked bout his BG control and about how all the complications of DM are beginning to develop now. He knows what he should do and knows how to do it, but can't make himself consistently follow his DM care plan. We also talked about his depression. His future insurance status and housing status remain in limbo. We discussed with both the patient and his mother about how to contact [employer] to verify that they have no additional co-pays.  We also discussed how to use the Federal ACA website and how to apply for the [facility] charity program.  4. Follow-up: 3  months 5. [name] gave Donette Larry and me his verbal permission to talk with his parents about his health care issues. We will send him the written permission form for him to sign.   Level of Service: This visit lasted in excess of 135 minutes. More than 50% of the visit was devoted to counseling.  David Stall, MD 11/17/2012 5:10 PM

## 2012-11-17 NOTE — Patient Instructions (Signed)
Follow up visit in 3 months. 

## 2012-11-18 ENCOUNTER — Encounter: Payer: Self-pay | Admitting: "Endocrinology

## 2012-11-28 ENCOUNTER — Other Ambulatory Visit: Payer: Self-pay | Admitting: *Deleted

## 2012-12-17 ENCOUNTER — Ambulatory Visit (INDEPENDENT_AMBULATORY_CARE_PROVIDER_SITE_OTHER): Payer: PRIVATE HEALTH INSURANCE | Admitting: "Endocrinology

## 2012-12-17 ENCOUNTER — Encounter: Payer: Self-pay | Admitting: "Endocrinology

## 2012-12-17 VITALS — BP 125/83 | HR 109 | Wt 128.5 lb

## 2012-12-17 DIAGNOSIS — E236 Other disorders of pituitary gland: Secondary | ICD-10-CM

## 2012-12-17 DIAGNOSIS — E1149 Type 2 diabetes mellitus with other diabetic neurological complication: Secondary | ICD-10-CM

## 2012-12-17 DIAGNOSIS — E063 Autoimmune thyroiditis: Secondary | ICD-10-CM

## 2012-12-17 DIAGNOSIS — E1169 Type 2 diabetes mellitus with other specified complication: Secondary | ICD-10-CM

## 2012-12-17 DIAGNOSIS — E1065 Type 1 diabetes mellitus with hyperglycemia: Secondary | ICD-10-CM

## 2012-12-17 DIAGNOSIS — R000 Tachycardia, unspecified: Secondary | ICD-10-CM

## 2012-12-17 DIAGNOSIS — F329 Major depressive disorder, single episode, unspecified: Secondary | ICD-10-CM

## 2012-12-17 DIAGNOSIS — F32A Depression, unspecified: Secondary | ICD-10-CM

## 2012-12-17 DIAGNOSIS — Z9119 Patient's noncompliance with other medical treatment and regimen: Secondary | ICD-10-CM

## 2012-12-17 DIAGNOSIS — E1143 Type 2 diabetes mellitus with diabetic autonomic (poly)neuropathy: Secondary | ICD-10-CM

## 2012-12-17 DIAGNOSIS — E1049 Type 1 diabetes mellitus with other diabetic neurological complication: Secondary | ICD-10-CM

## 2012-12-17 DIAGNOSIS — E230 Hypopituitarism: Secondary | ICD-10-CM

## 2012-12-17 DIAGNOSIS — E1043 Type 1 diabetes mellitus with diabetic autonomic (poly)neuropathy: Secondary | ICD-10-CM

## 2012-12-17 DIAGNOSIS — E039 Hypothyroidism, unspecified: Secondary | ICD-10-CM

## 2012-12-17 DIAGNOSIS — E11649 Type 2 diabetes mellitus with hypoglycemia without coma: Secondary | ICD-10-CM

## 2012-12-17 DIAGNOSIS — G909 Disorder of the autonomic nervous system, unspecified: Secondary | ICD-10-CM

## 2012-12-17 DIAGNOSIS — I10 Essential (primary) hypertension: Secondary | ICD-10-CM

## 2012-12-17 LAB — POCT GLYCOSYLATED HEMOGLOBIN (HGB A1C): Hemoglobin A1C: 8.7

## 2012-12-17 NOTE — Patient Instructions (Signed)
Follow up visit in 3 months. 

## 2012-12-17 NOTE — Progress Notes (Signed)
Subjective:  Patient Name: Scott Moran Date of Birth: [birth year]  MRN: [id number]  Scott Moran  presents to the office today for follow-up of his type 1 diabetes mellitus, goiter, hypoglycemia, depression, hypothyroidism, thyroiditis, diabetic autonomic neuropathy, tachycardia, nonproliferative diabetic retinopathy, diabetic peripheral neuropathy, fatigue, and hypogonadism.  HISTORY OF PRESENT ILLNESS:   Scott Moran is a 22 y.o. Caucasian young man. Scott Moran was unaccompanied.   1. I first saw the patient in consultation on 03/27/05 in the [facility]patient [facility] of [facility] in [place], [place]. He had been referred by his primary care provider, Dr. Egbert Garibaldi , [place], [facility], for evaluation and treatment of poorly controlled type 1 diabetes. The patient was 22 years old.  A. The patient had been diagnosed with type 1 diabetes in February of 1995 at age 36. He was on an insulin pump at that time I first met him. His hemoglobin A1c had increased from 7.1% in December of 2005 to 8.2% in April 2006. He was having frequent snacks that he was not taking boluses for. He was also frequently noncompliant with checking blood sugars and taking meal boluses. I adjusted his basal rates.  B. At his next [facility] visit on 06/19/05, he was still very noncompliant. Mental status was normal. He had 25 g goiter. His father was serving in [place] with the [employer]. There was a lot of stress at home. I again adjusted his basal rates.  C. At the end of July 2006 we closed our satellite office in [address]. The patient elected to have me follow him at our clinic here in [place]. I have followed him here ever since. 2. During the past seven years, the patient has had several clinical issues.   A. T1DM: The patient's blood glucose control has been fairly good for a teenager. His maximum hemoglobin A1c occurred on 04/16/06 when the hemoglobin A1c was 12.1%. Since then the hemoglobin A1c values  have varied between 6.9% and 8.4%. He is still fairly noncompliant with checking blood sugars, bolusing, and changing his insulin pump sites on time, but he is more compliant and more careful than he used to be.   B. Goiter, Hashimoto's thyroiditis, and hypothyroidism: In late 2008 and early 2009 the patient developed hypothyroidism secondary to Hashimoto's disease. I started him on Synthroid, 25 mcg per day. When he takes his medication, he is euthyroid.  C. Autonomic neuropathy and tachycardia: In late 2008 he developed autonomic neuropathy which manifested as tachycardia.These problems vary in parallel with his BGs and HbA1c values.  D. Diabetic peripheral neuropathy: The patient has had symptoms and signs of mild diabetic peripheral neuropathy at several times in the past. His symptoms similarly vary in parallel with his BGs and HbA1c values.   E. Non-proliferative diabetic retinopathy: This problem was identified during a diabetic eye exam in June 2010.  F. Hypogonadotropic hypogonadism: The patient has always been somewhat lackadaisical and relatively passive. On 06/29/10 his FSH was 4.9, LH was 2.4, testosterone was 399.66, and free testosterone was 79.8. The testosterone and free testosterone were relatively low for an 22 year old young man. At the same time the [facility] and LH while "normal", appeared to be somewhat low for the level of testosterone he had. We discussed the options for treatment including exercise and medication, to include AndroGel. The patient chose the exercise option. By 10/19/10 the [facility] was 6.8, the LH was 3.2, total testosterone was 615.88 and the free testosterone was 131.6. It may be that when he was more depressed  he had less gonadotropic function. We will continue to follow up on this issue over time.  G. The patient has been diagnosed with depression at different times in the past. For some time he took Remeron (mirtazapine), but discontinued this in early 2012. I have been  trying to convince him for some time that he is depressed and that he should seek out help. Thus far, however, he has not usually been willing to admit that he was depressed or that he needed help.  3. The patient's last [facility] visit was on 11/17/12. In the interim, he has not been checking BGs as often as he should have. He takes his Synthroid and lisinopril at night just before he goes to bed. He admits to being "pretty lazy" when it comes to his DM care. He spends most of the time just sitting around at home, surfing the web, and visiting in chat rooms. His Synthroid dose is 25 mcg/day and his lisinopril dose is 10 mg.  4. Pertinent Review of Systems:  Constitutional: The patient feels "all right overall". He denies using any drugs. He has no significant complaints. His allergies have been quiescent.  His mood seems normal. Eyes: Vision is good with glasses. There are no significant eye complaints. Last eye exam was a few months ago. There were no signs of diabetic eye disease.  Neck: The patient has no complaints of anterior neck swelling, soreness, tenderness,  pressure, discomfort, or difficulty swallowing.  Heart: Heart rate sometimes feels fast. Heart rate increases with exercise or other physical activity. The patient has no complaints of palpitations, irregular heat beats, chest pain, or chest pressure. Gastrointestinal: Bowel movents seem normal. The patient has no complaints of excessive hunger, acid reflux, upset stomach, stomach aches or pains, diarrhea, or constipation. Legs: Muscle mass and strength seem normal. There are no complaints of numbness, tingling, burning, or pain. No edema is noted. Feet: There are no obvious foot problems. There are no complaints of numbness, tingling, burning, or pain. No edema is noted. Hypoglycemia: He does not have low BGs too often.     Emotional/psychological: The patient states he is "all right". He does not think that he needs referral to psych at this  time. 6. BG printout: The patient is checking his blood sugars anywhere from 0-5 times daily. He averages 1.9 BG checks per day. BGs vary from 46- >400, averaging 241. BGs are occasionally low when he wakes up in the early afternoon if he did not check his BGs before he goes to bed. He sometimes goes 40-48 hours between BG checks and 48-72 hours between boluses. On other days he may bolus 3-6 times. He is better about bolusing than he is about checking BGs.     PAST MEDICAL, FAMILY, AND SOCIAL HISTORY:  Past Medical History  Diagnosis Date  . Type 1 diabetes mellitus not at goal   . Hypoglycemia associated with diabetes   . Goiter   . Thyroiditis, autoimmune   . Hypothyroidism, acquired, autoimmune   . Type 1 diabetes mellitus with diabetic autonomic neuropathy   . Tachycardia   . DM type 1 with diabetic peripheral neuropathy   . Hypertension   . Hypogonadotropic hypogonadism     Family History  Problem Relation Age of Onset  . Diabetes Maternal Uncle   . Hypertension Maternal Grandmother   . Thyroid disease Neg Hx   . Obesity Neg Hx   . Kidney disease Neg Hx   . Cancer Neg Hx   .  Anemia Neg Hx     Current outpatient prescriptions:glucagon (GLUCAGON EMERGENCY) 1 MG injection, Inject 1 mg intramuscularly in thigh 1 time for severe hypoglycemia if unresponsive, unconscious, unable to swallow or has a seizure., Disp: 2 kit, Rfl: 3;  glucose blood (BAYER CONTOUR NEXT TEST) test strip, Use as instructed to check your blood sugar up to 6x daily. Please provide #90 day supply., Disp: 612 each, Rfl: 1 glucose blood (BAYER CONTOUR NEXT TEST) test strip, Check sugar 10 x daily, Disp: 900 each, Rfl: 3;  insulin aspart (NOVOLOG) 100 UNIT/ML injection, Use as directed by physician for back up if insulin pump fails. , Disp: , Rfl: ;  insulin aspart (NOVOLOG) 100 UNIT/ML injection, Novolog aspart, 12 vials for 90 days, 300 units in insulin pump every 48 - 72 hours and per Protocols for Hyperglycemia  & DKA Outpatient Treatment.  1 Refill , Disp: , Rfl:  insulin glargine (LANTUS) 100 UNIT/ML injection, Use as directed by physician for back up if insulin pump fails. , Disp: , Rfl: ;  Insulin Pen Needle 31G X 5 MM MISC, BD Pen Needles- brand specific Inject insulin via insulin pen 7 x daily, Disp: 250 each, Rfl: 3;  levothyroxine (SYNTHROID) 25 MCG tablet, Take 1 tablet (25 mcg total) by mouth daily., Disp: 30 tablet, Rfl: 6 lisinopril (PRINIVIL,ZESTRIL) 10 MG tablet, Take 1 tablet (10 mg total) by mouth daily., Disp: 30 tablet, Rfl: 6;  Loratadine (CLARITIN PO), Take 25 mcg by mouth as needed. , Disp: , Rfl:   Allergies as of 12/17/2012  . (No Known Allergies)    1. Work and Family: Patient is no longer attending junior college, in part due to family financial problems. Father is working, but does not receive a very [employer]. He has no health insurance benefits. Dad's job may or may not continue. The family now has health insurance with [employer]. The patient is not working and is not interested in looking for work. 2. Activities: Few physical activities. He is a history buff. 3. Smoking, alcohol, or drugs: None 4. Primary Care Provider: He no longer has a primary care provider.  REVIEW OF SYSTEMS: There are no other significant problems involving Scott Moran's other body systems.   Objective:  Vital Signs:  BP 125/83  Pulse 109  Wt 128 lb 8 oz (58.287 kg) His weight has been stable since last visit.     Ht Readings from Last 3 Encounters:  No data found for Ht   Wt Readings from Last 3 Encounters:  12/17/12 128 lb 8 oz (58.287 kg)  11/17/12 128 lb (58.06 kg)  08/14/12 129 lb 8 oz (58.741 kg)   PHYSICAL EXAM:  Constitutional: Scott Moran was again very bright and alert today. He is still concerned, however, about whether his father's job will continue and about whether the family will have to move back to [place]. He appears healthy, but slimmer.  Face: The face appears normal.    Eyes: There is no obvious arcus or proptosis. Moisture appears normal. Mouth: The oropharynx and tongue appear normal. Oral moisture is normal. Neck: The neck appears to be visibly normal. No carotid bruits are noted. The thyroid gland is 20+ grams in size. The left lobe is mildly enlarged. The consistency of the thyroid gland is normal today. The thyroid gland is not tender to palpation. Lungs: The lungs are clear to auscultation. Air movement is good. Heart: Heart rate and rhythm are regular. Heart sounds S1 and S2 are normal. I did not  appreciate any pathologic cardiac murmurs. Abdomen: The abdomen is normal in size. Bowel sounds are normal. There is no obvious hepatomegaly, splenomegaly, or other mass effect.  Arms: Muscle size and bulk are normal for age. Hands: There is no obvious tremor. Phalangeal and metacarpophalangeal joints are normal. Palmar muscles are normal. Palmar skin is normal. Palmar moisture is also normal. Legs: Muscles appear normal for age. No edema is present. Feet: Feet are normally formed. Dorsalis pedal pulses are 1+ on the right foot and faint 1+ on the left foot.  Neurologic: Strength is normal for age in both the upper and lower extremities. Muscle tone is normal. Sensation to touch is normal in both legs, but decreased in the right heel and left ball.     LAB DATA: Hemoglobin A1c was 8.7% today, compared with 8.5% on 11/14/12 and with 7.2% and 7.8% at the preceding visits.    Recent Results (from the past 504 hour(s))  GLUCOSE, POCT (MANUAL RESULT ENTRY)   Collection Time   12/17/12  1:05 PM      Component Value Range   POC Glucose 332 (*) 70 - 99 mg/dl  POCT GLYCOSYLATED HEMOGLOBIN (HGB A1C)   Collection Time   12/17/12  1:19 PM      Component Value Range   Hemoglobin A1C 8.7       Assessment and Plan:   ASSESSMENT:  1. Type 1 diabetes mellitus: The patient's BG control has deteriorated. He is still having a great deal of variability in blood glucose  levels. He does not seem willing or able to force himself to do what he knows that he should do to take better control of his T1DM. 2. Hypoglycemia: He is having more frequent hypoglycemia, especially between 10 AM-3 PM when he sleeps in late. Conversely, he has had many BGs >400 when he does not check BGs and take boluses. 3. Autonomic neuropathy and tachycardia: Patient still has these problems because he has a lot of higher blood sugars. 4. Peripheral neuropathy: Patient still has this problem because he has a lot of higher blood sugars. 5. Hypothyroid: He was euthyroid one month ago. He has been taking his Synthroid regularly.  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   7. Hypertension: Blood pressure is better when he takes his lisinopril regularly. 8. Hashimoto's disease: Thyroiditis is clinically quiescent. 9. Depression: He seems less depressed today. I offered to refer him to psych. He refuses. He says that he is happier. 10. Noncompliance: He still does what he wants when he wants to do it. 11. Weight loss: His weight has been stable since last visit.   PLAN:  1. Diagnostic: No lab tests ordered today. 2. Therapeutic: Continue Synthroid and lisinopril at the current doses. Change basal rates as follows: Midnight: 0.850 5 AM: 0.850 9 AM: 1.05 -> 1.00 12 Noon: 1.25 -> 1.15 6 PM: 1.70 9 PM: 1.30   3. Patient education: We talked about his BG control and about how all the complications of DM can develop. He knows what he should do and knows how to do it, but can't make himself consistently follow his DM care plan. We also talked about his depression. We also discussed how to use the [employer] site. We spent a long time discussing strategies for getting his life in order and moving on with his education. 4. Follow-up: 3 months   Level of Service: This visit lasted in excess of 135 minutes. More than 50% of the visit was  devoted to  counseling.  David Stall, MD 12/17/2012 1:25 PM

## 2013-01-17 ENCOUNTER — Other Ambulatory Visit: Payer: Self-pay

## 2013-01-24 ENCOUNTER — Other Ambulatory Visit: Payer: Self-pay | Admitting: "Endocrinology

## 2013-03-13 ENCOUNTER — Telehealth: Payer: Self-pay | Admitting: *Deleted

## 2013-03-13 NOTE — Telephone Encounter (Signed)
I received confirmation from [facility] [facility] that pt's MiniMed 530G insulin pump has shipped. I called and left voice mail message on home phone suggesting that they meet with me at 1:30 or 3:30 next Thurs afternoon 4/17 for me to start him on his new pump. I requested a call back on Mon 03/16/13 to confirm.

## 2013-03-17 ENCOUNTER — Telehealth: Payer: Self-pay | Admitting: *Deleted

## 2013-03-17 NOTE — Telephone Encounter (Signed)
Mom returned my call and left me the following information re. Scheduling with me on 03/19/13 for 530G Insulin Pump Upgrade and start: 1. Confirming 1:15 pm appt with me on 03/19/13. 2. Scott Moran has programmed his new pump and is wearing it. Mom questions if he did it correctly. 3. Scott Moran has not done any 530G Pump or [employer] training assignments. 4. They have new insurance now:  [employer] [place]. Father has a full time job. 5. Scott Moran is very depressed and they want to move forward with a referral to possibly Dr. Shane Crutch or another psychiatrist. They will talk with Dr. Fransico Khayri about it at their 03/19/13 appt.

## 2013-03-19 ENCOUNTER — Ambulatory Visit (INDEPENDENT_AMBULATORY_CARE_PROVIDER_SITE_OTHER): Payer: BC Managed Care – PPO | Admitting: "Endocrinology

## 2013-03-19 ENCOUNTER — Encounter: Payer: Self-pay | Admitting: *Deleted

## 2013-03-19 ENCOUNTER — Telehealth: Payer: Self-pay | Admitting: *Deleted

## 2013-03-19 ENCOUNTER — Encounter: Payer: Self-pay | Admitting: "Endocrinology

## 2013-03-19 ENCOUNTER — Ambulatory Visit (INDEPENDENT_AMBULATORY_CARE_PROVIDER_SITE_OTHER): Payer: BC Managed Care – PPO | Admitting: *Deleted

## 2013-03-19 VITALS — BP 128/87 | HR 115 | Wt 130.0 lb

## 2013-03-19 DIAGNOSIS — E11649 Type 2 diabetes mellitus with hypoglycemia without coma: Secondary | ICD-10-CM

## 2013-03-19 DIAGNOSIS — E1069 Type 1 diabetes mellitus with other specified complication: Secondary | ICD-10-CM

## 2013-03-19 DIAGNOSIS — I10 Essential (primary) hypertension: Secondary | ICD-10-CM

## 2013-03-19 DIAGNOSIS — Z9119 Patient's noncompliance with other medical treatment and regimen: Secondary | ICD-10-CM

## 2013-03-19 DIAGNOSIS — F32A Depression, unspecified: Secondary | ICD-10-CM

## 2013-03-19 DIAGNOSIS — E038 Other specified hypothyroidism: Secondary | ICD-10-CM

## 2013-03-19 DIAGNOSIS — E063 Autoimmune thyroiditis: Secondary | ICD-10-CM

## 2013-03-19 DIAGNOSIS — E1169 Type 2 diabetes mellitus with other specified complication: Secondary | ICD-10-CM

## 2013-03-19 DIAGNOSIS — E1065 Type 1 diabetes mellitus with hyperglycemia: Secondary | ICD-10-CM

## 2013-03-19 DIAGNOSIS — F329 Major depressive disorder, single episode, unspecified: Secondary | ICD-10-CM

## 2013-03-19 DIAGNOSIS — E236 Other disorders of pituitary gland: Secondary | ICD-10-CM

## 2013-03-19 DIAGNOSIS — E230 Hypopituitarism: Secondary | ICD-10-CM

## 2013-03-19 LAB — GLUCOSE, POCT (MANUAL RESULT ENTRY): POC Glucose: 239 mg/dl — AB (ref 70–99)

## 2013-03-19 MED ORDER — GLUCOSE BLOOD VI STRP: ORAL_STRIP | Status: DC

## 2013-03-19 MED ORDER — SYNTHROID 25 MCG PO TABS: ORAL_TABLET | ORAL | Status: DC

## 2013-03-19 MED ORDER — INSULIN ASPART 100 UNIT/ML ~~LOC~~ SOLN: SUBCUTANEOUS | Status: DC

## 2013-03-19 MED ORDER — LISINOPRIL 10 MG PO TABS: 10.0000 mg | ORAL_TABLET | Freq: Every day | ORAL | Status: DC

## 2013-03-19 NOTE — Progress Notes (Signed)
Subjective:  Patient Name: Scott Moran Date of Birth: 03-13-1991  MRN: [id number]  Scott Moran  presents to the office today for follow-up of his type 1 diabetes mellitus, goiter, hypoglycemia, depression, hypothyroidism, thyroiditis, diabetic autonomic neuropathy, tachycardia, nonproliferative diabetic retinopathy, diabetic peripheral neuropathy, fatigue, and hypogonadism.  HISTORY OF PRESENT ILLNESS:   [name] is a 22 y.o. Caucasian young man. [name] was unaccompanied.   1. I first saw the patient in consultation on 03/27/05 in the Out-patient [facility] of [facility] in [place], [place]. He had been referred by his primary care provider, Dr. Egbert Garibaldi, [place], [facility], for evaluation and treatment of poorly controlled type 1 diabetes. The patient was 22 years old.  A. The patient had been diagnosed with type 1 diabetes in February of 1995 at age 66. He was on an insulin pump at that time I first met him. His hemoglobin A1c had increased from 7.1% in December of 2005 to 8.2% in April 2006. He was having frequent snacks that he was not taking boluses for. He was also frequently noncompliant with checking blood sugars and taking meal boluses. I adjusted his basal rates.  B. At his next [facility] visit on 06/19/05, he was still very noncompliant. Mental status was normal. He had 25 g goiter. His father was serving in [place] with the [employer]. There was a lot of stress at home. I again adjusted his basal rates.  C. At the end of July 2006 we closed our satellite office in [place]. The patient elected to have me follow him at our clinic here in [place]. I have followed him here ever since. 2. During the past eight years, the patient has had several clinical issues.   A. T1DM: The patient's blood glucose control has been fairly good for a teenager. His maximum hemoglobin A1c occurred on 04/16/06 when the hemoglobin A1c was 12.1%. Since then the hemoglobin A1c values have  varied between 6.9% and 8.4%. He is still fairly noncompliant with checking blood sugars, bolusing, and changing his insulin pump sites on time, but he is more compliant and more careful than he used to be.   B. Goiter, Hashimoto's thyroiditis, and hypothyroidism: In late 2008 and early 2009 the patient developed hypothyroidism secondary to Hashimoto's disease. I started him on Synthroid, 25 mcg per day. When he takes his medication, he is euthyroid.  C. Autonomic neuropathy and tachycardia: In late 2008 he developed autonomic neuropathy which manifested as tachycardia.These problems vary in parallel with his BGs and HbA1c values.  D. Diabetic peripheral neuropathy: The patient has had symptoms and signs of mild diabetic peripheral neuropathy at several times in the past. His symptoms similarly vary in parallel with his BGs and HbA1c values.   E. Non-proliferative diabetic retinopathy: This problem was identified during a diabetic eye exam in June 2010.  F. Hypogonadotropic hypogonadism: The patient has always been somewhat lackadaisical and relatively passive. On 06/29/10 his FSH was 4.9, LH was 2.4, testosterone was 399.66, and free testosterone was 79.8. The testosterone and free testosterone were relatively low for an 22 year old young man. At the same time the [facility] and LH while "normal", appeared to be somewhat low for the level of testosterone he had. We discussed the options for treatment including exercise and medication, to include AndroGel. The patient chose the exercise option. By 10/19/10 the [facility] was 6.8, the LH was 3.2, total testosterone was 615.88 and the free testosterone was 131.6. It may be that when he was more depressed he  had less gonadotropic function. We will continue to follow up on this issue over time.  G. Depression: The patient has been diagnosed with depression at different times in the past. For some time he took Remeron (mirtazapine), but discontinued this in early 2012. I have  been trying to convince him for some time that he is depressed and that he should seek out help. Thus far, however, he has not usually been willing to admit that he was depressed or that he needed help. [ As I learned after this visit, he did go to [facility] recently for an evaluation, but did not like how he was treated and so refused to return. His mother gave this information to our diabetes education nurse. When I talked with [name] at the end of today's visit about going to [facility] for an initial visit, he made no mention of having gone. There is no encounter form or appointment record showing that he ever had an appointment or was seen at [facility] Perhaps he went to another behavioral health center.]  H. 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 The patient's last [facility] visit was on 12/17/12. In the interim, he has not been checking BGs as often as he should have. He takes his Synthroid dose of 25 mcg/day, but not lisinopril 10 mg/day. He admits to being "very disorganized when it comes to his DM care. He spends most of the time just sitting around at home, surfing the web, and visiting in chat rooms.  He is very frustrated with some of the bad things that are going on in [place] and in the world. In balance, however, he also sees that many good things are going on in he [place] and in the rest of the world. 4. Pertinent Review of Systems:  Constitutional: The patient feels "kinda tired". He often does not sleep well. He denies using any drugs or alcohol. He has no significant complaints. His allergies have been quiescent.  His mood seems relatively normal. Eyes: Vision is good with glasses. There are no significant eye complaints. Last eye exam was a few months ago. There were no signs of diabetic eye disease.  Neck: The patient has no complaints of anterior neck swelling,  soreness, tenderness,  pressure, discomfort, or difficulty swallowing.  Heart: Heart rate sometimes feels fast. Heart rate increases with exercise or other physical activity. The patient has no complaints of palpitations, irregular heat beats, chest pain, or chest pressure. Gastrointestinal: Bowel movents seem normal. The patient has no complaints of excessive hunger, acid reflux, upset stomach, stomach aches or pains, diarrhea, or constipation. Legs: Muscle mass and strength seem normal. There are no complaints of numbness, tingling, burning, or pain. No edema is noted. Feet: There are no obvious foot problems. There are no complaints of numbness, tingling, burning, or pain. No edema is noted. Hypoglycemia: He occasionally has a low G upon awakening. The BG was so low once recently, a 40 BG, that he needed assistance from his parents.   Emotional/psychological: The patient states he is "relatively all right". He does not think that he needs referral to psych at this time. As noted above, he frequently gets angry about how our government screws things up. 6. BG printout: The patient changes his insertion sites every 3-9 days. He checks BGs 0-3 times daily. He often goes 2 days without checking his BGs. His average BG is 245, +/-  123. He boluses 0-6 times daily, averaging about 3/day. He had one 40 after a long period without checking. He also had 4 BGs > 400.   PAST MEDICAL, FAMILY, AND SOCIAL HISTORY:  Past Medical History  Diagnosis Date  . Type 1 diabetes mellitus not at goal   . Hypoglycemia associated with diabetes   . Goiter   . Thyroiditis, autoimmune   . Hypothyroidism, acquired, autoimmune   . Type 1 diabetes mellitus with diabetic autonomic neuropathy   . Tachycardia   . DM type 1 with diabetic peripheral neuropathy   . Hypertension   . Hypogonadotropic hypogonadism     Family History  Problem Relation Age of Onset  . Diabetes Maternal Uncle   . Hypertension Maternal Grandmother    . Thyroid disease Neg Hx   . Obesity Neg Hx   . Kidney disease Neg Hx   . Cancer Neg Hx   . Anemia Neg Hx     Current outpatient prescriptions:glucagon (GLUCAGON EMERGENCY) 1 MG injection, Inject 1 mg intramuscularly in thigh 1 time for severe hypoglycemia if unresponsive, unconscious, unable to swallow or has a seizure., Disp: 2 kit, Rfl: 3;  glucose blood (BAYER CONTOUR NEXT TEST) test strip, Use as instructed to check your blood sugar up to 6x daily. Please provide #90 day supply., Disp: 612 each, Rfl: 1 glucose blood (BAYER CONTOUR NEXT TEST) test strip, Check sugar 10 x daily, Disp: 900 each, Rfl: 3;  insulin aspart (NOVOLOG) 100 UNIT/ML injection, Use as directed by physician for back up if insulin pump fails. , Disp: , Rfl: ;  insulin aspart (NOVOLOG) 100 UNIT/ML injection, Novolog aspart, 12 vials for 90 days, 300 units in insulin pump every 48 - 72 hours and per Protocols for Hyperglycemia & DKA Outpatient Treatment.  1 Refill , Disp: , Rfl:  insulin glargine (LANTUS) 100 UNIT/ML injection, Use as directed by physician for back up if insulin pump fails. , Disp: , Rfl: ;  Insulin Pen Needle 31G X 5 MM MISC, BD Pen Needles- brand specific Inject insulin via insulin pen 7 x daily, Disp: 250 each, Rfl: 3;  lisinopril (PRINIVIL,ZESTRIL) 10 MG tablet, Take 1 tablet (10 mg total) by mouth daily., Disp: 30 tablet, Rfl: 6;  Loratadine (CLARITIN PO), Take 25 mcg by mouth as needed. , Disp: , Rfl:  SYNTHROID 25 MCG tablet, TAKE 1 TABLET DAILY, Disp: 90 tablet, Rfl: 3  Allergies as of 03/19/2013  . (No Known Allergies)    1. Work and Family: Patient is no longer attending junior college, in part due to family financial problems. Father is now working at a different job and has Programmer, applications, better salary, and better hours. The patient is not working and is not interested in looking for work. 2. Activities: Few physical activities. He is a history buff. 3. Smoking, alcohol, or drugs: None 4.  Primary Care Provider: He no longer has a primary care provider.  REVIEW OF SYSTEMS: There are no other significant problems involving Salmaan's other body systems.   Objective:  Vital Signs:  BP 128/87  Pulse 115  Wt 130 lb (58.968 kg) His weight has been stable since last visit.     Ht Readings from Last 3 Encounters:  No data found for Ht   Wt Readings from Last 3 Encounters:  03/19/13 130 lb (58.968 kg)  03/19/13 130 lb (58.968 kg)  12/17/12 128 lb 8 oz (58.287 kg)   PHYSICAL EXAM:  Constitutional: Ramsey was again very bright and  alert today. He appears healthy, but slim. He intellectualizes about everything, except for his T1DM.   Face: The face appears normal.  Eyes: There is no obvious arcus or proptosis. Moisture appears normal. Mouth: The oropharynx and tongue appear normal. Oral moisture is normal. Neck: The neck looks normal. No carotid bruits are noted. The thyroid gland is 20+ grams in size. Both lobes are mildly enlarged, the right larger than the left.  The consistency of the thyroid gland is normal today.The thyroid gland is not tender to palpation. Lungs: The lungs are clear to auscultation. Air movement is good. Heart: Heart rate and rhythm are regular. Heart sounds S1 and S2 are normal. I did not appreciate any pathologic cardiac murmurs. Abdomen: The abdomen is normal in size. Bowel sounds are normal. There is no obvious hepatomegaly, splenomegaly, or other mass effect.  Arms: Muscle size and bulk are normal for age. Hands: There is no obvious tremor. Phalangeal and metacarpophalangeal joints are normal. Palmar muscles are normal. Palmar skin is normal. Palmar moisture is also normal. Legs: Muscles appear normal for age. No edema is present. Feet: Feet are normally formed. Dorsalis pedal pulses are 1+bilaterally.  Neurologic: Strength is normal for age in both the upper and lower extremities. Muscle tone is normal. Sensation to touch is normal in both legs, but  decreased in the right heel and left ball.     LAB DATA: Hemoglobin A1c was 7.6% today, compared with 8.7% at last visit and with 8.5% on 11/14/12.     Results for orders placed in visit on 03/19/13 (from the past 504 hour(s))  GLUCOSE, POCT (MANUAL RESULT ENTRY)   Collection Time    03/19/13  1:06 PM      Result Value Range   POC Glucose 239 (*) 70 - 99 mg/dl  POCT GLYCOSYLATED HEMOGLOBIN (HGB A1C)   Collection Time    03/19/13  1:11 PM      Result Value Range   Hemoglobin A1C 7.6       Assessment and Plan:   ASSESSMENT:  1. Type 1 diabetes mellitus: The patient's BG control appears to have improved, but  this "improvement" is due in part to having more frequent hypoglycemia.  He does not seem willing or able to force himself to do what he knows that he should do to take better control of his T1DM. 2. Hypoglycemia: He is having more frequent hypoglycemia, especially between 10 AM-3 PM when he sleeps in late. Conversely, he has had many BGs >400 when he does not check BGs and take boluses. 3. Autonomic neuropathy and tachycardia: Patient still has these problems because he has a lot of higher blood sugars. These problems are reversible if he gets his BGs in better control.  4. Peripheral neuropathy: This problem is not evident today.  5. Hypothyroid: He was euthyroid 4  months ago. He has been taking his Synthroid regularly.  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   7. Hypertension: His BP is worse since stopping the lisinopril. 8. Hashimoto's disease: Thyroiditis is clinically quiescent. 9. Depression: This is his major problem. He seems less depressed today. I offered to refer him to psych. He refuses. He says that he is happier. 10. Noncompliance: He still does what he wants when he wants to do it. 11. Weight loss: His weight has been stable since last visit.   PLAN:  1. Diagnostic: No labs today 2. Therapeutic: Continue Synthroid and  lisinopril at the current doses. Continue basal rates  as follows: Midnight: 0.850 5 AM: 0.850 9 AM: 1.00 12 Noon: 1.15 6 PM: 1.70 9 PM: 1.30   3. Patient education: We talked about his BG control and about how all the complications of DM can develop. He knows what he should do and knows how to do it, but can't make himself consistently follow his DM care plan. We also talked about his depression. 4. Follow-up: 3 months   Level of Service: This visit lasted in excess of 135 minutes. More than 50% of the visit was devoted to counseling.  David Stall, MD 03/19/2013 2:40 PM

## 2013-03-19 NOTE — Progress Notes (Addendum)
Scott Moran and his Mother, Scott Moran, present today for pump upgrade and start from his Paradigm 723 Revel Pump to the new MiniMed 530G Insulin Pump with Enlite.  [name] has a Type 1 DM follow-up visit with Dr. Fransico Riccardo after his appt with me.    [name] reports: 1. He completed one or two chapters of 530G online training at [employer], but nothing more. 2. He recently transferred what settings he needed and started using his 530G pump. 3. He has new insurance:  Pt's new insurance:  [employer] [place] PPO, Group # [id number].  Subscriber: Scott Moran, ID# [id number]  [employer] is Pharmacy Benefits Mgr  4. He needs his RX's transferred to [facility].  He needs RX's sent today for his Novolog vials for his pump and for his Contour Next Test Strips.  Mother reports: 1. She is very concerned about Alvar's behavior and non-compliance with his diabetes self-care.  She has been his care-taker since he was first diagnosed with Type 1 Diabetes at 22 mo   of age.  2. He is 22 y.o.   Bertil lives at home, which is okay, but he stays in his room almost exclusively, rarely leaves the house and doesn't socialize.  He stays in his room almost all the time   in his underwear.  Only comes down to meals on occasion, but grazes all day long on junk-food.  He doesn't seem to want to go back to school or get a job.    She states "this is not normal behavior". 3. She is afraid it is her fault and that she is enabling him. 4. Mom thinks [name]'s behavior is immature and until he becomes more mature, starts taking responsibility for and being compliant with his diabetes care, someone needs to supervise him.  She feels that it is her job still. " I care about what happens to him.". She is concerned about him. But states she is frustrated and doesn't know how to motivate him.  [name], his Mom and I discussed: 1. Most young adults see themselves as "invinceable."  They do not have the "hind sight"  older adults do. 2. It is difficult for Parents to "let go" especially when you know how bad the long term complications can be if he doesn't take care of his diabetes. 3. Parents need to recognize that Khyrin is 22 y.o.,  A legal adult and he owns the disease.  Not Mom, Dad or his Brother. 4. If the Parents have a problem with the way Jacobo lives, they need to negotiate expectations together. 5. During this discussion Bronislaw and his Mom made several sharp quips back and forth.  Each time she spoke, Bayani would grit his teeth.  In the end, Mom stated "we don't agree, but we are very close and are trying to work it out together."  My observations: 1. While in the lab area while I downloaded Scott Moran's pump, checked BG and HbA1c, Mother made many loud, demeaning quips to him.  It was clear by his body language and  Lack of response that he was embarrassed by it. 2. During the discussion in my office, [name]'s responses to the quips were very defensive and sounded like he has given up on himself. He clearly doesn't see anything wrong   with the lifestyle he has chosen. 3. He does agree that his Mom has always taken care of his diabetes and he never really worried about it. 4. HbA1c today was 7.6%.  Pump download reports confirm 0 to 2 BG checks daily. [name] confirms that he only checks his BG when he has symptoms of high or low BGs or just  Doesn't feel good.  Thus the highs and lows pretty much cancel each other out and present a fairly good HbA1c.  We discussed changes that he needs to make: 1. Check BG's at least 4 times daily. If not eating a meal, check at 4 specified times at least 2.5 hours after the last dose of insulin. 2. Reviewed and instructed on the Dual Wave and Square Wave Boluses:  When to use;  How to use to cover food when grazing. 3. Needs to take correction and food boluses (if eating) at least 4 times daily based on accurate carb counts. 4. Enlite Sensor Pre-Start  Training Assignments that must be completed prior to attend sensor start class. 5. He needs to download to [facility] next week, then call us with his Username and Password.    Insulin Pump Model:   530G  Serial Number:  Insulin Pump Type: Pump Upgrade from 723  Infusion Set:  Quick Set (pt is not sure if 6mm or 8mm)  Upgrade PrePump Training Assignments completed:   1. A few Chapter of myLearning Module for the 530G Insulin Pump  2. Transferred some current pump settings to 530G    Basic Features: the following have been reviewed and programmed: 1. Active Insulin display screens 2. Alert Directed Navigation 3. Time/Date 4.. Alert Type:    Vibrate  5. Changes in Menu screens   BOLUS MENU 1. Bolus Wizard settings / Confirmed in Review Settings:   .  Carb Ratios:  Time  Ratio      12:00   am 15      11:30   am 12        4:30   pm 15         Sensitivity:  Time  Sensivity      12:00  Am  65           Targets:  Time  BG Target Range      12:00  am 150 -150 mg/dL         6:29   am 528 -110 mg/dl        4:13   pm 244 - 150 mg/dl          Active Insulin Time: 3 Hours  2. Max Bolus: 15 units 3. Scroll Rate:  Set for 0.050 units 8. Dual/Square Wave Bolus:  ON  9. BG Reminder:  ON  10. Missed Bolus Reminder:   ON Start Time Stop Time       12 am  2 am       1130 am 3:30 pm        BASAL MENU  1. Basal rates; confirmed in Basal Review:      Time  Basal Rate  Units/Hour   12:00 am 0.85      9:00  am 1.00   12:00   Pm    1.15     6:00 Pm 1.70     9:00   Pm 1.30      2. Max Basal Rate: 2.00  Units/Hr 3. Basal Patterns:  OFF 4. Temp Basal: PERCENT of Basal Rate   UTILITIES MENU  1. Auto Off 2. Low Reservoir Warning: 20 UNITS 3. Meter ID [id number] 4. USER SETTINGS: Settings Saved 5. Capture Option:  ON Explained in detail  RESERVOIR &  SET MENU 1. Pt set/site change due tomorrow.  Rewound pump and transferred Reservoir  in use to new pump.  Fill Cannula completed prior to  reattaching tubing to  infusion site. Pt stated he didn't know what fill cannula is and why it is so important.  This was discussed/instructed on) 2. Fill Cannula amount:   0.3 u  (Pt has not been doing filling the cannula)  ADDITIONAL FEATURES -  [facility]  1. Patient is not currently using [facility] Personal. Mom thinks they set it up a long time ago, but never used it. 2. Discussed value of using [facility] Personal 3. Requested parents set it up and call me with their   User Name:_________________ Passcode: _______________  4. Instructed on [facility] Personal upload:  When and why.  Additional topics: 1. Treating BGs above 250 mg/dL/DKA prevention  2. Set change every 2-3 days 3. Treating hypoglycemia (15-15 Rule and 30/15 Rule) 4.  Proper site rotation.  Larry is currently his using anterior thighs only 5. Online resources:    [email or web address]  PLAN: 1. Pt will download to [facility] in 2 weeks and call me with their User Name and Password. 2. Kycen will start checking his blood sugars 4 times daily and will try using Dual Wave and Square Wave boluses when he grazes instead of eating a meal. 3. Todrick will call me and Nancie Neas when he has finished the [employer] at [employer] and completed the [employer].  We will then schedule him in the next Sensor Start Training Class.

## 2013-03-19 NOTE — Telephone Encounter (Signed)
Left voice mail on pt's home phone to confirm receipt of Mother's previous voice mail and that I am expecting them between 1 - 1:15 pm today.    I just got a call back from pt's Mother acknowledging receipt of phone call.  She informed me that Aarav does not see anything wrong with sitting around his room in his underwear all day long with the door closed. He doe not think he  is depressed.  Mom does.  Mother informed me that she does not plan to be in the room when Cornelis sees Dr. Fransico Jeron after he meets with me.  She wants him to be referred to Dr. Shane Crutch at [facility] in [place] if they will take his insurance which is now [place].  I will call their office to find out.  I called Dr. Karma Ganja office and spoke with Delorise Shiner in new patient referrals. She confirmed that they do take [place] insurance.  I will let the family know.

## 2013-03-19 NOTE — Patient Instructions (Signed)
Follow up visit in 3 months. 

## 2013-05-05 ENCOUNTER — Telehealth: Payer: Self-pay | Admitting: *Deleted

## 2013-05-05 NOTE — Telephone Encounter (Signed)
05/04/13 I received a voice mail from Barrington's Mother, Beth Miyazaki, stating that: 1. They have decided to have [name] seen by Dr. Cheral Almas at [facility] in [place]. 2. [employer] is [place] of [place]. 3. She is requesting Dr. Fransico Yobany to please make the referral.  I returned her call today and left her a voice mail: 1. Please call Dr. Karma Ganja office to make sure they will take their insurance. 2. If so, please call back and leave a voice mail for Dr. Fransico Jansen requesting a referral. 3. I will make Dr. Fransico Honest aware. 4. If questions, please call us.

## 2013-07-01 ENCOUNTER — Ambulatory Visit (INDEPENDENT_AMBULATORY_CARE_PROVIDER_SITE_OTHER): Payer: BC Managed Care – PPO | Admitting: "Endocrinology

## 2013-07-01 ENCOUNTER — Encounter: Payer: Self-pay | Admitting: "Endocrinology

## 2013-07-01 VITALS — BP 114/83 | HR 102 | Wt 131.4 lb

## 2013-07-01 DIAGNOSIS — E1043 Type 1 diabetes mellitus with diabetic autonomic (poly)neuropathy: Secondary | ICD-10-CM

## 2013-07-01 DIAGNOSIS — E1049 Type 1 diabetes mellitus with other diabetic neurological complication: Secondary | ICD-10-CM

## 2013-07-01 DIAGNOSIS — E038 Other specified hypothyroidism: Secondary | ICD-10-CM

## 2013-07-01 DIAGNOSIS — I10 Essential (primary) hypertension: Secondary | ICD-10-CM

## 2013-07-01 DIAGNOSIS — E236 Other disorders of pituitary gland: Secondary | ICD-10-CM

## 2013-07-01 DIAGNOSIS — E230 Hypopituitarism: Secondary | ICD-10-CM

## 2013-07-01 DIAGNOSIS — Z9119 Patient's noncompliance with other medical treatment and regimen: Secondary | ICD-10-CM

## 2013-07-01 DIAGNOSIS — F329 Major depressive disorder, single episode, unspecified: Secondary | ICD-10-CM

## 2013-07-01 DIAGNOSIS — E1169 Type 2 diabetes mellitus with other specified complication: Secondary | ICD-10-CM

## 2013-07-01 DIAGNOSIS — E11649 Type 2 diabetes mellitus with hypoglycemia without coma: Secondary | ICD-10-CM

## 2013-07-01 DIAGNOSIS — E1042 Type 1 diabetes mellitus with diabetic polyneuropathy: Secondary | ICD-10-CM

## 2013-07-01 DIAGNOSIS — E063 Autoimmune thyroiditis: Secondary | ICD-10-CM

## 2013-07-01 DIAGNOSIS — E1065 Type 1 diabetes mellitus with hyperglycemia: Secondary | ICD-10-CM

## 2013-07-01 DIAGNOSIS — R000 Tachycardia, unspecified: Secondary | ICD-10-CM

## 2013-07-01 DIAGNOSIS — I498 Other specified cardiac arrhythmias: Secondary | ICD-10-CM

## 2013-07-01 DIAGNOSIS — G909 Disorder of the autonomic nervous system, unspecified: Secondary | ICD-10-CM

## 2013-07-01 DIAGNOSIS — F32A Depression, unspecified: Secondary | ICD-10-CM

## 2013-07-01 NOTE — Progress Notes (Signed)
Subjective:  Patient Name: Scott Moran Date of Birth: 1991-11-25  MRN: [id number]  Scott Moran  presents to the office today for follow-up of his type 1 diabetes mellitus, goiter, hypoglycemia, depression, hypothyroidism, thyroiditis, diabetic autonomic neuropathy, tachycardia, non-proliferative diabetic retinopathy, diabetic peripheral neuropathy, fatigue, hypogonadotropic hypogonadism, and medical non-compliance.  HISTORY OF PRESENT ILLNESS:   Scott Moran is a 22 y.o. Caucasian young man. Scott Moran was unaccompanied.   1. I first saw the patient in consultation on 03/27/05 in the Out-patient Clinic of [facility] in [place], [place]. He had been referred by his primary care provider, Dr. Egbert Garibaldi, in [place], [place], for evaluation and treatment of poorly controlled type 1 diabetes. The patient was 22 years old.  A. The patient had been diagnosed with type 1 diabetes in February of 1995 at age 22. He was on an insulin pump at that time I first met him. His hemoglobin A1c had increased from 7.1% in December of 2005 to 8.2% in April 2006. He was having frequent snacks that he was not taking boluses for. He was also frequently noncompliant with checking blood sugars and taking meal boluses. I adjusted his basal rates.  B. At his next [place] visit on 06/19/05, he was still very noncompliant. Mental status was normal. He had 25 g goiter. His father was serving in [place] with the [employer]. There was a lot of stress at home. I again adjusted his basal rates.  C. At the end of July 2006 we closed our satellite office in [place]. The patient elected to have me follow him at our clinic here in [place]. I have followed him here ever since.  2. During the past eight years, the patient has had several clinical issues.   A. T1DM: The patient's blood glucose control has been fairly good for a teenager. His maximum hemoglobin A1c occurred on 04/16/06 when the hemoglobin A1c was  12.1%. Since then the hemoglobin A1c values have varied between 6.9% and 8.4%. He is still fairly noncompliant with checking blood sugars, bolusing, and changing his insulin pump sites on time, but he is more compliant and more careful than he used to be.   B. Goiter, Hashimoto's thyroiditis, and hypothyroidism: In late 2008 and early 2009 the patient developed hypothyroidism secondary to Hashimoto's disease. I started him on Synthroid, 25 mcg per day. When he takes his medication, he is euthyroid.  C. Autonomic neuropathy and tachycardia: In late 2008 he developed autonomic neuropathy which manifested as tachycardia.These problems vary in parallel with his BGs and HbA1c values.  D. Diabetic peripheral neuropathy: The patient has had symptoms and signs of mild diabetic peripheral neuropathy at several times in the past. His neuropathic symptoms similarly vary in parallel with his BGs and HbA1c values.   E. Non-proliferative diabetic retinopathy: This problem was identified during a diabetic eye exam in June 2010.  F. Hypogonadotropic hypogonadism: The patient has always been somewhat lackadaisical and relatively passive. On 06/29/10 his FSH was 4.9, LH was 2.4, testosterone was 399.66, and free testosterone was 79.8. The testosterone and free testosterone were relatively low for an 22 year old young man. At the same time the [facility] and LH while "normal", appeared to be somewhat low for the level of testosterone he had. We discussed the options for treatment including exercise and medication, to include AndroGel. The patient chose the exercise option. By 10/19/10 the [facility] was 6.8, the LH was 3.2, total testosterone was 615.88 and the free testosterone was 131.6. It may be that  when he was more depressed he had less gonadotropic function. We will continue to follow up on this issue over time.  G. Depression: The patient has been diagnosed with depression at different times in the past. For some time he took Remeron  (mirtazapine), but discontinued this in early 2012. I have been trying to convince him for some time that he is depressed and that he should seek out help. Thus far, however, he has not usually been willing to admit that he was depressed or that he needed help. [ As I learned after his January 15th 2014 visit, he did go to [facility] recently for an evaluation, but did not like how he was treated and so refused to return. His mother gave this information to our diabetes education nurse. When I talked with Scott Moran at the end of today's visit about going to [facility] for an initial visit, he made no mention of having gone. There is no encounter form or appointment record showing that he ever had an appointment or was seen at [facility]. Perhaps he went to another behavioral health center.]  H. 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   3. The patient's last [facility] visit was on 03/19/13. In the interim, he has been healthy.   A. He was seen by Dr. Piedad Climes on 06/08/13. Scott Moran had a lot of testing done. On the GMAT he had problems with non-verbal reasoning and emotional intelligence. He spent the last part of that visit with Dr. Shane Crutch.  Dr. 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 Dr.Gualtieri prescribed escitalopram, 10 mg/day. Scott Moran will see Dr. Shane Crutch in follow up next week.   Scott Moran was then referred to Jerlyn Ly, a therapist, who Scott Moran felt was "cool".  Scott Moran will see Scott Moran every two weeks for the near future.   C. Since starting escitalopram, Scott Moran has felt better and has been more physically active.   Scott Moran takes his Synthroid dose of 25 mcg/day, but not lisinopril 10 mg/day.   E. He admits to being "very disorganized when it comes to his DM care. He  spends most of the time just sitting around at home, surfing the web, and visiting in chat rooms. He is a history buff and loves to learn about little oddities of history. 4. Pertinent Review of Systems:  Constitutional: The patient feels "kinda tired". He often does not sleep well. He denies using any drugs or alcohol. He has no significant complaints. His allergies have been quiescent.  His mood seems relatively normal. Eyes: Vision is good with glasses. There are no significant eye complaints. Last eye exam was a few months ago. There were no signs of diabetic eye disease.  Neck: The patient has no complaints of anterior neck swelling, soreness, tenderness,  pressure, discomfort, or difficulty swallowing.  Heart: Heart rate sometimes feels fast. Heart rate increases with exercise or other physical activity. The patient has no complaints of palpitations, irregular heat beats, chest pain, or chest pressure. Gastrointestinal: Bowel movents seem normal. The patient has no complaints of excessive hunger, acid reflux, upset stomach, stomach aches or pains, diarrhea, or constipation. Legs: Muscle mass and strength seem normal. There are no complaints of numbness, tingling, burning, or pain. No edema is noted. Feet: There are no obvious foot problems. There are no complaints of numbness,  tingling, burning, or pain. No edema is noted. Hypoglycemia: He occasionally has a low BG upon awakening, but most of his lows occur during the day. None of his recent low BGs have been severe.  Emotional/psychological: The patient is doing better for the first time in months. 6. BG printout: The patient changes his insertion sites every 8-9 days. He checks BGs 0-6 times daily, mostly 3-4 times per day. He sometimes goes for up to 30 hours without checking BGs. His average BG is 231, compared with 245 at last visit. He boluses 0-6 times daily, averaging about 3/day. He has had 10 BGs < 70 this month. He probably has not been  eating as much as he had before.  PAST MEDICAL, FAMILY, AND SOCIAL HISTORY:  Past Medical History  Diagnosis Date  . Type 1 diabetes mellitus not at goal   . Hypoglycemia associated with diabetes   . Goiter   . Thyroiditis, autoimmune   . Hypothyroidism, acquired, autoimmune   . Type 1 diabetes mellitus with diabetic autonomic neuropathy   . Tachycardia   . DM type 1 with diabetic peripheral neuropathy   . Hypertension   . Hypogonadotropic hypogonadism     Family History  Problem Relation Age of Onset  . Diabetes Maternal Uncle   . Hypertension Maternal Grandmother   . Thyroid disease Neg Hx   . Obesity Neg Hx   . Kidney disease Neg Hx   . Cancer Neg Hx   . Anemia Neg Hx     Current outpatient prescriptions:escitalopram (LEXAPRO) 10 MG tablet, Take 10 mg by mouth daily., Disp: , Rfl: ;  glucagon (GLUCAGON EMERGENCY) 1 MG injection, Inject 1 mg intramuscularly in thigh 1 time for severe hypoglycemia if unresponsive, unconscious, unable to swallow or has a seizure., Disp: 2 kit, Rfl: 3 glucose blood (BAYER CONTOUR NEXT TEST) test strip, Use as instructed to check your blood sugar up to 6x daily. Please provide #90 day supply., Disp: 600 each, Rfl: 1;  insulin aspart (NOVOLOG) 100 UNIT/ML injection, 300 units in insulin pump every 48 to 72 hours and per Protocols for Hyperglycemia & DKA Outpatient Treatment., Disp: 12 vial, Rfl: 1 Insulin Pen Moran 31G X 5 MM MISC, BD Pen Needles- brand specific Inject insulin via insulin pen 7 x daily, Disp: 250 each, Rfl: 3;  lisinopril (PRINIVIL,ZESTRIL) 10 MG tablet, Take 1 tablet (10 mg total) by mouth daily., Disp: 90 tablet, Rfl: 1;  Loratadine (CLARITIN PO), Take 25 mcg by mouth as needed. , Disp: , Rfl: ;  SYNTHROID 25 MCG tablet, 1 tablet daily, Disp: 90 tablet, Rfl: 1 insulin glargine (LANTUS) 100 UNIT/ML injection, Use as directed by physician for back up if insulin pump fails. , Disp: , Rfl:   Allergies as of 07/01/2013  . (No Known  Allergies)    1. Work and Family: Patient is no longer attending junior college, in part due to family financial problems. Father is now working at a different job and has Programmer, applications, better salary, and better hours. The patient is still not working and is not interested in looking for work. 2. Activities: Few physical activities. He is a history buff. 3. Smoking, alcohol, or drugs: None 4. Primary Care Provider: He no longer has a primary care provider.  REVIEW OF SYSTEMS: There are no other significant problems involving Scott Moran's other body systems.   Objective:  Vital Signs:  BP 114/83  Pulse 102  Wt 131 lb 6.4 oz (59.603 kg) His weight has been  stable since last visit.     Ht Readings from Last 3 Encounters:  No data found for Ht   Wt Readings from Last 3 Encounters:  07/01/13 131 lb 6.4 oz (59.603 kg)  03/19/13 130 lb (58.968 kg)  03/19/13 130 lb (58.968 kg)   PHYSICAL EXAM:  Constitutional: Scott Moran was again very bright and alert today. He appears healthy, but slim. He was more engaged and interactive today. He did not overly intellectualize much today. He was much less defensive than I have seen him in months. 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 here in [place].   Face: The face appears normal.  Eyes: There is no obvious arcus or proptosis. Moisture appears normal. Mouth: The oropharynx and tongue appear normal. Oral moisture is normal. Neck: The neck looks normal. No carotid bruits are noted. The thyroid gland is slightly smaller ar 20-20+ grams in size. Both lobes are only minimally enlarged. The consistency of the thyroid gland is normal today.The thyroid gland is not tender to palpation. Lungs: The lungs are clear to auscultation. Air movement is good. Heart: Heart rate and rhythm are regular. Heart sounds S1 and S2 are normal. I  did not appreciate any pathologic cardiac murmurs. Abdomen: The abdomen is normal in size. Bowel sounds are normal. There is no obvious hepatomegaly, splenomegaly, or other mass effect.  Arms: Muscle size and bulk are normal for age. Hands: There is no obvious tremor. Phalangeal and metacarpophalangeal joints are normal. Palmar muscles are normal. Palmar skin is normal. Palmar moisture is also normal. Legs: Muscles appear normal for age. No edema is present. Feet: Feet are normally formed. Dorsalis pedal pulses are 1+bilaterally.  Neurologic: Strength is normal for age in both the upper and lower extremities. Muscle tone is normal. Sensation to touch is normal in both legs, but decreased in the right heel and left ball.     LAB DATA: Hemoglobin A1c was 7.7% today, compared with 7.6% last visit and with 8.7% at the prior visit.   Results for orders placed in visit on 07/01/13 (from the past 504 hour(s))  GLUCOSE, POCT (MANUAL RESULT ENTRY)   Collection Time    07/01/13  2:12 PM      Result Value Range   POC Glucose 139 (*) 70 - 99 mg/dl  POCT GLYCOSYLATED HEMOGLOBIN (HGB A1C)   Collection Time    07/01/13  2:19 PM      Result Value Range   Hemoglobin A1C 7.7       Assessment and Plan:   ASSESSMENT:  1. Type 1 diabetes mellitus: The patient's BG control appears to have improved, but  this "improvement" is due in part to having more frequent hypoglycemia. He is doing a better job of checking BGs and taking insulin boluses. When he does not eat, however, his BGS are often <70. When he forgets to bolus, BGs will often be > 400.  2. Hypoglycemia: He is having more frequent hypoglycemia, especially between 9 AM-9 PM.  3. Autonomic neuropathy and tachycardia: Patient still has these problems because he has a lot of higher blood sugars. These problems are reversible if he gets his BGs in better control.  4. Peripheral neuropathy: This problem is again mild today.  5. Hypothyroid: He was  euthyroid 4  months ago. He has been taking his Synthroid regularly.  6. Hypogonadotropic hypogonadism: He  remains hypogonadal symptomatically.   7. Hypertension: His BP is too high, He needs to resume lisinopril. 8. Hashimoto's disease: Thyroiditis is clinically quiescent. 9. Depression: This is his major problem. He seems less depressed today.  10. Noncompliance: He still does what he wants to do when he wants to do it. Or stated another way, he avoids doing what he doesn't feel like doing. 11. Weight loss: His weight has been stable since December.  PLAN:  1. Diagnostic: No labs today 2. Therapeutic: Continue Synthroid and lisinopril at the current doses. Reduce basal rates as follows: Midnight: 0.850 5 AM: 0.850 9 AM: 1.00 -> 0.90 12 Noon: 1.15 -> 1.05 6 PM: 1.70 -> 1.60 9 PM: 1.30   3. Patient education: We talked about his BG control and about how all the complications of DM can develop. He knows what he should do and knows how to do it, but can't consistently make himself follow his DM care plan. We also talked about his depression. 4. Follow-up: 3 months   Level of Service: This visit lasted in excess of 75 minutes. More than 50% of the visit was devoted to counseling.  David Stall, MD 07/01/2013 2:48 PM

## 2013-07-01 NOTE — Patient Instructions (Signed)
Follow up in 3 months

## 2013-07-02 DIAGNOSIS — Z9119 Patient's noncompliance with other medical treatment and regimen: Secondary | ICD-10-CM | POA: Insufficient documentation

## 2013-08-22 ENCOUNTER — Encounter (HOSPITAL_COMMUNITY): Payer: Self-pay | Admitting: Emergency Medicine

## 2013-08-22 ENCOUNTER — Observation Stay (HOSPITAL_COMMUNITY): Payer: BC Managed Care – PPO

## 2013-08-22 ENCOUNTER — Inpatient Hospital Stay (HOSPITAL_COMMUNITY)
Admission: EM | Admit: 2013-08-22 | Discharge: 2013-08-25 | DRG: 566 | Disposition: A | Discharge status: Home / Self Care | Payer: BC Managed Care – PPO | Attending: Internal Medicine | Admitting: Internal Medicine

## 2013-08-22 DIAGNOSIS — N179 Acute kidney failure, unspecified: Secondary | ICD-10-CM

## 2013-08-22 DIAGNOSIS — Z79899 Other long term (current) drug therapy: Secondary | ICD-10-CM

## 2013-08-22 DIAGNOSIS — F329 Major depressive disorder, single episode, unspecified: Secondary | ICD-10-CM | POA: Diagnosis present

## 2013-08-22 DIAGNOSIS — F3289 Other specified depressive episodes: Secondary | ICD-10-CM | POA: Diagnosis present

## 2013-08-22 DIAGNOSIS — E1043 Type 1 diabetes mellitus with diabetic autonomic (poly)neuropathy: Secondary | ICD-10-CM

## 2013-08-22 DIAGNOSIS — E1042 Type 1 diabetes mellitus with diabetic polyneuropathy: Secondary | ICD-10-CM

## 2013-08-22 DIAGNOSIS — Z9641 Presence of insulin pump (external) (internal): Secondary | ICD-10-CM

## 2013-08-22 DIAGNOSIS — I10 Essential (primary) hypertension: Secondary | ICD-10-CM

## 2013-08-22 DIAGNOSIS — R000 Tachycardia, unspecified: Secondary | ICD-10-CM

## 2013-08-22 DIAGNOSIS — E063 Autoimmune thyroiditis: Secondary | ICD-10-CM

## 2013-08-22 DIAGNOSIS — E1010 Type 1 diabetes mellitus with ketoacidosis without coma: Secondary | ICD-10-CM

## 2013-08-22 DIAGNOSIS — E230 Hypopituitarism: Secondary | ICD-10-CM

## 2013-08-22 DIAGNOSIS — E1065 Type 1 diabetes mellitus with hyperglycemia: Secondary | ICD-10-CM | POA: Diagnosis present

## 2013-08-22 DIAGNOSIS — E039 Hypothyroidism, unspecified: Secondary | ICD-10-CM | POA: Diagnosis present

## 2013-08-22 DIAGNOSIS — E1110 Type 2 diabetes mellitus with ketoacidosis without coma: Secondary | ICD-10-CM

## 2013-08-22 DIAGNOSIS — R6510 Systemic inflammatory response syndrome (SIRS) of non-infectious origin without acute organ dysfunction: Secondary | ICD-10-CM | POA: Diagnosis present

## 2013-08-22 DIAGNOSIS — R6511 Systemic inflammatory response syndrome (SIRS) of non-infectious origin with acute organ dysfunction: Secondary | ICD-10-CM | POA: Diagnosis present

## 2013-08-22 DIAGNOSIS — E049 Nontoxic goiter, unspecified: Secondary | ICD-10-CM

## 2013-08-22 DIAGNOSIS — G909 Disorder of the autonomic nervous system, unspecified: Secondary | ICD-10-CM | POA: Diagnosis present

## 2013-08-22 DIAGNOSIS — E119 Type 2 diabetes mellitus without complications: Secondary | ICD-10-CM

## 2013-08-22 DIAGNOSIS — Z794 Long term (current) use of insulin: Secondary | ICD-10-CM

## 2013-08-22 DIAGNOSIS — E109 Type 1 diabetes mellitus without complications: Secondary | ICD-10-CM

## 2013-08-22 DIAGNOSIS — E11649 Type 2 diabetes mellitus with hypoglycemia without coma: Secondary | ICD-10-CM

## 2013-08-22 DIAGNOSIS — E1049 Type 1 diabetes mellitus with other diabetic neurological complication: Secondary | ICD-10-CM | POA: Diagnosis present

## 2013-08-22 LAB — URINALYSIS, ROUTINE W REFLEX MICROSCOPIC
Ketones, ur: >80 mg/dL — AB
Leukocytes, UA: NEGATIVE
Protein, ur: NEGATIVE mg/dL
Specific Gravity, Urine: 1.025 (ref 1.005–1.030)
Urobilinogen, UA: 0.2 mg/dL (ref 0.0–1.0)

## 2013-08-22 LAB — CBC
HCT: 45.8 % (ref 39.0–52.0)
Hemoglobin: 16.5 g/dL (ref 13.0–17.0)
MCH: 30.7 pg (ref 26.0–34.0)
MCV: 85.1 fL (ref 78.0–100.0)
RBC: 5.38 MIL/uL (ref 4.22–5.81)

## 2013-08-22 LAB — BASIC METABOLIC PANEL
CO2: 9 mEq/L — CL (ref 19–32)
Calcium: 8.4 mg/dL (ref 8.4–10.5)
Calcium: 8.1 mg/dL — ABNORMAL LOW (ref 8.4–10.5)
Chloride: 105 mEq/L (ref 96–112)
Creatinine, Ser: 1.08 mg/dL (ref 0.50–1.35)
Creatinine, Ser: 1.11 mg/dL (ref 0.50–1.35)
GFR calc Af Amer: >90 mL/min (ref >90)
GFR calc Af Amer: >90 mL/min (ref >90)
GFR calc non Af Amer: >90 mL/min (ref >90)
GFR calc non Af Amer: >90 mL/min (ref >90)
Glucose, Bld: 584 mg/dL (ref 70–99)
Glucose, Bld: 396 mg/dL — ABNORMAL HIGH (ref 70–99)
Potassium: 5.5 mEq/L — ABNORMAL HIGH (ref 3.5–5.1)
Potassium: 4.3 mEq/L (ref 3.5–5.1)
Sodium: 138 mEq/L (ref 135–145)
Sodium: 140 mEq/L (ref 135–145)

## 2013-08-22 LAB — GLUCOSE, CAPILLARY
Glucose-Capillary: >600 mg/dL (ref 70–99)
Glucose-Capillary: >600 mg/dL (ref 70–99)
Glucose-Capillary: 460 mg/dL — ABNORMAL HIGH (ref 70–99)
Glucose-Capillary: 400 mg/dL — ABNORMAL HIGH (ref 70–99)
Glucose-Capillary: 354 mg/dL — ABNORMAL HIGH (ref 70–99)

## 2013-08-22 LAB — KETONES, QUALITATIVE

## 2013-08-22 LAB — COMPREHENSIVE METABOLIC PANEL
BUN: 25 mg/dL — ABNORMAL HIGH (ref 6–23)
CO2: 8 mEq/L — CL (ref 19–32)
Calcium: 9.6 mg/dL (ref 8.4–10.5)
Creatinine, Ser: 1.39 mg/dL — ABNORMAL HIGH (ref 0.50–1.35)
GFR calc Af Amer: 83 mL/min — ABNORMAL LOW (ref >90)
GFR calc non Af Amer: 71 mL/min — ABNORMAL LOW (ref >90)
Glucose, Bld: 711 mg/dL (ref 70–99)

## 2013-08-22 LAB — URINE MICROSCOPIC-ADD ON: Urine-Other: NONE SEEN

## 2013-08-22 LAB — LIPASE, BLOOD: Lipase: 10 U/L — ABNORMAL LOW (ref 11–59)

## 2013-08-22 LAB — CG4 I-STAT (LACTIC ACID): Lactic Acid, Venous: 3.18 mmol/L — ABNORMAL HIGH (ref 0.5–2.2)

## 2013-08-22 MED ORDER — VANCOMYCIN HCL IN DEXTROSE 1-5 GM/200ML-% IV SOLN: 1000.0000 mg | Freq: Once | INTRAVENOUS | Status: DC

## 2013-08-22 MED ORDER — HEPARIN SODIUM (PORCINE) 5000 UNIT/ML IJ SOLN
5000.0000 [IU] | Freq: Three times a day (TID) | INTRAMUSCULAR | Status: DC
  Administered 2013-08-22 – 2013-08-25 (×9): 5000 [IU] via SUBCUTANEOUS
  Filled 2013-08-22 (×11): qty 1

## 2013-08-22 MED ORDER — SODIUM CHLORIDE 0.9 % IV SOLN: 1000.0000 mL | INTRAVENOUS | Status: DC

## 2013-08-22 MED ORDER — ESCITALOPRAM OXALATE 10 MG PO TABS
10.0000 mg | ORAL_TABLET | Freq: Every day | ORAL | Status: DC
  Administered 2013-08-23 – 2013-08-25 (×3): 10 mg via ORAL
  Filled 2013-08-22 (×3): qty 1

## 2013-08-22 MED ORDER — DEXTROSE-NACL 5-0.45 % IV SOLN
INTRAVENOUS | Status: AC
  Administered 2013-08-23: 125 mL via INTRAVENOUS
  Administered 2013-08-23: 02:00:00 via INTRAVENOUS
  Filled 2013-08-22: qty 1000

## 2013-08-22 MED ORDER — SODIUM CHLORIDE 0.9 % IV SOLN
INTRAVENOUS | Status: DC
  Administered 2013-08-22: 21:00:00 via INTRAVENOUS

## 2013-08-22 MED ORDER — SODIUM CHLORIDE 0.9 % IV SOLN
1000.0000 mL | Freq: Once | INTRAVENOUS | Status: AC
  Administered 2013-08-22: 1000 mL via INTRAVENOUS

## 2013-08-22 MED ORDER — ONDANSETRON HCL 4 MG/2ML IJ SOLN
4.0000 mg | Freq: Once | INTRAMUSCULAR | Status: AC
  Administered 2013-08-22: 4 mg via INTRAVENOUS
  Filled 2013-08-22: qty 2

## 2013-08-22 MED ORDER — LEVOTHYROXINE SODIUM 25 MCG PO TABS
25.0000 ug | ORAL_TABLET | Freq: Every day | ORAL | Status: DC
  Administered 2013-08-23 – 2013-08-25 (×3): 25 ug via ORAL
  Filled 2013-08-22 (×5): qty 1

## 2013-08-22 MED ORDER — SODIUM CHLORIDE 0.9 % IV SOLN: INTRAVENOUS | Status: DC

## 2013-08-22 MED ORDER — SODIUM CHLORIDE 0.9 % IV SOLN
INTRAVENOUS | Status: AC
  Administered 2013-08-22: 20:00:00 via INTRAVENOUS
  Administered 2013-08-23: 2.3 [IU]/h via INTRAVENOUS
  Administered 2013-08-23: 3.6 [IU]/h via INTRAVENOUS
  Administered 2013-08-23: 2.2 [IU]/h via INTRAVENOUS
  Filled 2013-08-22 (×3): qty 1

## 2013-08-22 MED ORDER — PIPERACILLIN-TAZOBACTAM 3.375 G IVPB 30 MIN: 3.3750 g | Freq: Once | INTRAVENOUS | Status: DC

## 2013-08-22 MED ORDER — ONDANSETRON HCL 4 MG/2ML IJ SOLN: 4.0000 mg | Freq: Three times a day (TID) | INTRAMUSCULAR | Status: AC | PRN

## 2013-08-22 MED ORDER — VANCOMYCIN HCL IN DEXTROSE 1-5 GM/200ML-% IV SOLN
1000.0000 mg | Freq: Three times a day (TID) | INTRAVENOUS | Status: DC
  Administered 2013-08-22 – 2013-08-23 (×2): 1000 mg via INTRAVENOUS
  Filled 2013-08-22 (×4): qty 200

## 2013-08-22 MED ORDER — HYDROMORPHONE HCL PF 1 MG/ML IJ SOLN: 1.0000 mg | INTRAMUSCULAR | Status: AC | PRN

## 2013-08-22 MED ORDER — PIPERACILLIN-TAZOBACTAM 3.375 G IVPB
3.3750 g | Freq: Three times a day (TID) | INTRAVENOUS | Status: DC
  Administered 2013-08-22 – 2013-08-23 (×2): 3.375 g via INTRAVENOUS
  Filled 2013-08-22 (×4): qty 50

## 2013-08-22 MED ORDER — ACETAMINOPHEN 500 MG PO TABS
1000.0000 mg | ORAL_TABLET | Freq: Four times a day (QID) | ORAL | Status: DC | PRN
  Filled 2013-08-22: qty 2

## 2013-08-22 MED ORDER — SODIUM CHLORIDE 0.9 % IV SOLN
INTRAVENOUS | Status: DC
  Administered 2013-08-22: 5.4 [IU]/h via INTRAVENOUS
  Filled 2013-08-22: qty 1

## 2013-08-22 MED ORDER — DEXTROSE 50 % IV SOLN
25.0000 mL | INTRAVENOUS | Status: DC | PRN
  Filled 2013-08-22: qty 50

## 2013-08-22 MED ORDER — SODIUM CHLORIDE 0.9 % IV BOLUS (SEPSIS)
1000.0000 mL | Freq: Once | INTRAVENOUS | Status: AC
  Administered 2013-08-22: 1000 mL via INTRAVENOUS

## 2013-08-22 MED ORDER — LISINOPRIL 10 MG PO TABS
10.0000 mg | ORAL_TABLET | Freq: Every day | ORAL | Status: DC
  Administered 2013-08-24 – 2013-08-25 (×2): 10 mg via ORAL
  Filled 2013-08-22 (×3): qty 1

## 2013-08-22 NOTE — ED Notes (Signed)
CBG reads greater than 600.

## 2013-08-22 NOTE — ED Notes (Signed)
Lactic acid results called to Dr.Jacobowitz 

## 2013-08-22 NOTE — ED Notes (Signed)
Personal insulin pump suspended due to low supply

## 2013-08-22 NOTE — ED Provider Notes (Signed)
Medical screening examination/treatment/procedure(s) were conducted as a shared visit with non-physician practitioner(s) and myself.  I personally evaluated the patient during the encounter  Doug Sou, MD 08/22/13 2100

## 2013-08-22 NOTE — ED Provider Notes (Signed)
CSN: [id number]     Arrival date & time 08/22/13  1650 History   First MD Initiated Contact with Patient 08/22/13 1710     Chief Complaint  Patient presents with  . Hyperglycemia   (Consider location/radiation/quality/duration/timing/severity/associated sxs/prior Treatment) The history is provided by the patient, medical records and a parent. No language interpreter was used.    Scott Moran is a 22 y.o. male  with a hx of IDDM (on insulin pump), HTN presents to the Emergency Department complaining of gradual, persistent, progressively worsening nausea and vomiting with associated hyperglcemia.  Mothers states he began to feel poorly yesterday at 5pm and then began vomiting at 3am.  Pt has attempted pedialyte and oral rehydration without success.  As pt's blood sugar climbed they contacted pt's endocrinologist who recommended er evaluation. Associated symptoms include fatigue, general illness, dry mouth.  Nothing makes it better and nothing makes it worse.  Mother reports that pts HR is normally high (122 yesterday).  Pt denies fever.  Endocrinologist: Dr Holley Bouche.  Last insulin was 4 units at 2pm.  Last episode of DKA was 3 years ago with a stomach virus.  Pt denies sick contacts.     Past Medical History  Diagnosis Date  . Type 1 diabetes mellitus not at goal   . Hypoglycemia associated with diabetes   . Goiter   . Thyroiditis, autoimmune   . Hypothyroidism, acquired, autoimmune   . Type 1 diabetes mellitus with diabetic autonomic neuropathy   . Tachycardia   . DM type 1 with diabetic peripheral neuropathy   . Hypertension   . Hypogonadotropic hypogonadism    Past Surgical History  Procedure Laterality Date  . None     Family History  Problem Relation Age of Onset  . Diabetes Maternal Uncle   . Hypertension Maternal Grandmother   . Thyroid disease Neg Hx   . Obesity Neg Hx   . Kidney disease Neg Hx   . Cancer Neg Hx   . Anemia Neg Hx    History  Substance Use Topics   . Smoking status: Never Smoker   . Smokeless tobacco: Never Used  . Alcohol Use: No    Review of Systems  Constitutional: Positive for fatigue. Negative for fever, diaphoresis, appetite change and unexpected weight change.  HENT: Negative for mouth sores and neck stiffness.   Eyes: Negative for visual disturbance.  Respiratory: Negative for cough, chest tightness, shortness of breath and wheezing.   Cardiovascular: Negative for chest pain.  Gastrointestinal: Positive for nausea, vomiting and abdominal pain (generalized soreness). Negative for diarrhea and constipation.  Endocrine: Negative for polydipsia, polyphagia and polyuria.  Genitourinary: Negative for dysuria, urgency, frequency and hematuria.  Musculoskeletal: Negative for back pain.  Skin: Positive for pallor. Negative for rash.  Allergic/Immunologic: Negative for immunocompromised state.  Neurological: Positive for weakness (generalized). Negative for syncope, light-headedness and headaches.  Hematological: Does not bruise/bleed easily.  Psychiatric/Behavioral: Negative for sleep disturbance. The patient is not nervous/anxious.     Allergies  Review of patient's allergies indicates no known allergies.  Home Medications   Current Outpatient Rx  Name  Route  Sig  Dispense  Refill  . escitalopram (LEXAPRO) 10 MG tablet   Oral   Take 10 mg by mouth daily.         . insulin aspart (NOVOLOG) 100 UNIT/ML injection      300 units in insulin pump every 48 to 72 hours and per Protocols for Hyperglycemia & DKA Outpatient Treatment.  12 vial   1   . levothyroxine (SYNTHROID, LEVOTHROID) 25 MCG tablet   Oral   Take 25 mcg by mouth daily before breakfast.         . lisinopril (PRINIVIL,ZESTRIL) 10 MG tablet   Oral   Take 1 tablet (10 mg total) by mouth daily.   90 tablet   1   . glucagon (GLUCAGON EMERGENCY) 1 MG injection      Inject 1 mg intramuscularly in thigh 1 time for severe hypoglycemia if unresponsive,  unconscious, unable to swallow or has a seizure.   2 kit   3   . glucose blood (BAYER CONTOUR NEXT TEST) test strip      Use as instructed to check your blood sugar up to 6x daily. Please provide #90 day supply.   600 each   1   . EXPIRED: insulin glargine (LANTUS) 100 UNIT/ML injection      Use as directed by physician for back up if insulin pump fails.          . Insulin Pen Needle 31G X 5 MM MISC      BD Pen Needles- brand specific Inject insulin via insulin pen 7 x daily   250 each   3     For questions regarding this prescription please c ...    BP 99/47  Pulse 131  Temp(Src) 99.2 F (37.3 C) (Oral)  Resp 20  SpO2 100% Physical Exam  Nursing note and vitals reviewed. Constitutional: He is oriented to person, place, and time. He appears well-developed and well-nourished. No distress.  Awake, alert, ill-appearing  HENT:  Head: Normocephalic and atraumatic.  Right Ear: Tympanic membrane, external ear and ear canal normal.  Left Ear: Tympanic membrane, external ear and ear canal normal.  Nose: Nose normal. No mucosal edema or rhinorrhea. No epistaxis. Right sinus exhibits no maxillary sinus tenderness and no frontal sinus tenderness. Left sinus exhibits no maxillary sinus tenderness and no frontal sinus tenderness.  Mouth/Throat: Uvula is midline, oropharynx is clear and moist and mucous membranes are normal. Mucous membranes are not pale and not cyanotic. No edematous. No oropharyngeal exudate, posterior oropharyngeal edema, posterior oropharyngeal erythema or tonsillar abscesses.  Eyes: Conjunctivae are normal. Pupils are equal, round, and reactive to light. No scleral icterus.  Neck: Normal range of motion and full passive range of motion without pain. Neck supple.  Cardiovascular: Normal rate, regular rhythm, normal heart sounds and intact distal pulses.   No murmur heard. Tachycardia  Pulmonary/Chest: Effort normal and breath sounds normal. No stridor. No  respiratory distress. He has no wheezes.  Abdominal: Soft. Normal appearance and bowel sounds are normal. He exhibits no distension and no mass. There is tenderness. There is no rebound, no guarding and no CVA tenderness.  Very mild, generalized abdominal soreness with palpation  Musculoskeletal: Normal range of motion. He exhibits no edema.  Lymphadenopathy:    He has no cervical adenopathy.  Neurological: He is alert and oriented to person, place, and time. He exhibits normal muscle tone. Coordination normal.  Speech is clear and goal oriented Moves extremities without ataxia  Skin: Skin is warm and dry. No rash noted. He is not diaphoretic. There is pallor.  Psychiatric: He has a normal mood and affect.    ED Course  Procedures (including critical care time) Labs Review Labs Reviewed  GLUCOSE, CAPILLARY - Abnormal; Notable for the following:    Glucose-Capillary >600 (*)    All other components within normal limits  CBC - Abnormal; Notable for the following:    WBC 32.6 (*)    All other components within normal limits  COMPREHENSIVE METABOLIC PANEL - Abnormal; Notable for the following:    Sodium 133 (*)    Potassium 5.9 (*)    Chloride 88 (*)    CO2 8 (*)    Glucose, Bld 711 (*)    BUN 25 (*)    Creatinine, Ser 1.39 (*)    Total Protein 8.4 (*)    GFR calc non Af Amer 71 (*)    GFR calc Af Amer 83 (*)    All other components within normal limits  LIPASE, BLOOD - Abnormal; Notable for the following:    Lipase 10 (*)    All other components within normal limits  URINALYSIS, ROUTINE W REFLEX MICROSCOPIC - Abnormal; Notable for the following:    APPearance HAZY (*)    Glucose, UA >1000 (*)    Hgb urine dipstick TRACE (*)    Bilirubin Urine SMALL (*)    Ketones, ur >80 (*)    All other components within normal limits  KETONES, QUALITATIVE - Abnormal; Notable for the following:    Acetone, Bld MODERATE (*)    All other components within normal limits  URINE  MICROSCOPIC-ADD ON  BLOOD GAS, VENOUS   Imaging Review No results found.  MDM   1. DKA (diabetic ketoacidoses)   2. Hypothyroidism, acquired, autoimmune   3. 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 Tachycardia    Scott Moran presents today ill-appearing, tachycardic with pallor. Patient with history of insulin-dependent diabetes on insulin pump and greater than 12 hours of nausea and vomiting without diarrhea.  CBC with significant leukocytosis at 32.6 and CMP with significant electrode abnormalities including bicarbonate of 8 and potassium of 5.9.  Glucose 711. Patient also with elevated BUN and creatinine likely secondary to his dehydration. Patient receiving fluids and will be begun on glucose stabilizer. Greater than 80 ketones in the urine.  VBG and CXR pending.  Unable to locate a source of infection at this time, but concern persists.  Urinalysis without evidence of urinary tract infection.  Abdomen remains soft without peritoneal signs or rebound tenderness, no evidence of URI.    Patient anion gap 37. Tachycardia persists without hypotension.  We'll continue glucose stabilizer and admit.  I have discussed this patient with Dr. Rennis Chris who has evaluated the patient and agrees with the plan for admission.    Dahlia Client Birttany Dechellis, PA-C 08/22/13 1920

## 2013-08-22 NOTE — Progress Notes (Signed)
ANTIBIOTIC CONSULT NOTE - INITIAL  Pharmacy Consult for Vancomycin Indication: rule out sepsis  No Known Allergies  Patient Measurements: Height: 5\' 9"  (175.3 cm) Weight: 135 lb (61.236 kg) IBW/kg (Calculated) : 70.7  Vital Signs: Temp: 99.2 F (37.3 C) (09/20 1903) Temp src: Oral (09/20 1903) BP: 108/56 mmHg (09/20 1923) Pulse Rate: 132 (09/20 1923) Intake/Output from previous day:   Intake/Output from this shift:    Labs:  Recent Labs  08/22/13 1754  WBC 32.6*  HGB 16.5  PLT 360  CREATININE 1.39*   CrCl is unknown because there is no height on file for the current visit. No results found for this basename: VANCOTROUGH, VANCOPEAK, VANCORANDOM, GENTTROUGH, GENTPEAK, GENTRANDOM, TOBRATROUGH, TOBRAPEAK, TOBRARND, AMIKACINPEAK, AMIKACINTROU, AMIKACIN,  in the last 72 hours   Microbiology: No results found for this or any previous visit (from the past 720 hour(s)).  Medical History: Past Medical History  Diagnosis Date  . Type 1 diabetes mellitus not at goal   . Hypoglycemia associated with diabetes   . Goiter   . Thyroiditis, autoimmune   . Hypothyroidism, acquired, autoimmune   . Type 1 diabetes mellitus with diabetic autonomic neuropathy   . Tachycardia   . DM type 1 with diabetic peripheral neuropathy   . Hypertension   . Hypogonadotropic hypogonadism     Medications:   (Not in a hospital admission)  Admit Complaint: 22 y.o.  male  admitted 08/22/2013 with DKA.  Pharmacy consulted to dose vancomycin and zosyn.  PMH: DM, hypogonadotropic hypogonadism, HTN, autoimmunthryoditis  Assessment: Infectious Disease: WBC 32.6, Vommiting x 24h, afebrile, tachycardic Antibiotics: 9/20 Vanc/Zosyn>> Cultures: 9/20 bloodx 2, urine  Goal of Therapy:  Vancomycin trough level 15-20 mcg/ml  Plan:  -Vancomycin 1g IV q8h - Zosyn 3.375 g IV q8h - Follow up SCr, UOP, cultures, clinical course and adjust as clinically indicated.    Thank you for allowing  pharmacy to be a part of this patients care team.  Lovenia Kim Pharm.D., BCPS Clinical Pharmacist 08/22/2013 7:36 PM Pager: [phone] Phone: [phone]

## 2013-08-22 NOTE — H&P (Signed)
[facility] History and Physical  Fran Mcree Tanguma ZOX:[id number] DOB: [birth year] DOA: 08/22/2013  Referring physician: ED PCP: Nonnie Done., MD   Chief Complaint: N/V, hyperglycemia  HPI: Scott Moran is a 22 y.o. male h/o DM1 IDDM on insulin pump at home who presents to ED complaining of gradually onset, worsening N/V with associated hyperglycemia.  Patient ate at a [employer] that they normally dont eat at yesterday, began to feel poorly at 5 pm and began vomiting at 3 AM last night.  BGL has been climbing throughout the day.  Called endocrinologist who promptly told them to come to ED.  In ED patient found to be in DKA as well as have WBC of 32k among other abnormalities.  No obvious source of infection yet despite work up, did have DKA 3 years ago with similar presentation with stomach virus.  Hospitalist asked to admit, empiric ABx ordered and patient made code sepsis.  Review of Systems: 12 systems reviewed and otherwise negative.  Past Medical History  Diagnosis Date  . Type 1 diabetes mellitus not at goal   . Hypoglycemia associated with diabetes   . Goiter   . Thyroiditis, autoimmune   . Hypothyroidism, acquired, autoimmune   . Type 1 diabetes mellitus with diabetic autonomic neuropathy   . Tachycardia   . DM type 1 with diabetic peripheral neuropathy   . Hypertension   . Hypogonadotropic hypogonadism    Past Surgical History  Procedure Laterality Date  . None     Social History:  reports that he has never smoked. He has never used smokeless tobacco. He reports that he does not drink alcohol or use illicit drugs.   No Known Allergies  Family History  Problem Relation Age of Onset  . Diabetes Maternal Uncle   . Hypertension Maternal Grandmother   . Thyroid disease Neg Hx   . Obesity Neg Hx   . Kidney disease Neg Hx   . Cancer Neg Hx   . Anemia Neg Hx     Prior to Admission medications   Medication Sig Start Date End Date Taking? Authorizing  Provider  escitalopram (LEXAPRO) 10 MG tablet Take 10 mg by mouth daily.   Yes Historical Provider, MD  insulin aspart (NOVOLOG) 100 UNIT/ML injection 300 units in insulin pump every 48 to 72 hours and per Protocols for Hyperglycemia & DKA Outpatient Treatment. 03/19/13  Yes David Stall, MD  levothyroxine (SYNTHROID, LEVOTHROID) 25 MCG tablet Take 25 mcg by mouth daily before breakfast.   Yes Historical Provider, MD  lisinopril (PRINIVIL,ZESTRIL) 10 MG tablet Take 1 tablet (10 mg total) by mouth daily. 03/19/13  Yes David Stall, MD  glucagon (GLUCAGON EMERGENCY) 1 MG injection Inject 1 mg intramuscularly in thigh 1 time for severe hypoglycemia if unresponsive, unconscious, unable to swallow or has a seizure. 04/16/12   David Stall, MD  glucose blood (BAYER CONTOUR NEXT TEST) test strip Use as instructed to check your blood sugar up to 6x daily. Please provide #90 day supply. 03/19/13 03/19/14  David Stall, MD  insulin glargine (LANTUS) 100 UNIT/ML injection Use as directed by physician for back up if insulin pump fails.  02/13/12 02/12/13  David Stall, MD  Insulin Pen Needle 31G X 5 MM MISC BD Pen Needles- brand specific Inject insulin via insulin pen 7 x daily 02/13/12   David Stall, MD   Physical Exam: Filed Vitals:   08/22/13 1930  BP: 118/51  Pulse: 135  Temp:  Resp:     General:  Ill appearing, somewhat toxic Eyes: PEERLA EOMI ENT: mucous membranes moist Neck: supple w/o JVD Cardiovascular: tachycardic w/o MRG Respiratory: CTA B Abdomen: soft, nt, nd, bs+ Skin: no rash nor lesion Musculoskeletal: MAE, full ROM all 4 extremities Psychiatric: normal tone and affect, tired appearing Neurologic: AAOx3, grossly non-focal  Labs on Admission:  Basic Metabolic Panel:  Recent Labs Lab 08/22/13 1754  NA 133*  K 5.9*  CL 88*  CO2 8*  GLUCOSE 711*  BUN 25*  CREATININE 1.39*  CALCIUM 9.6   Liver Function Tests:  Recent Labs Lab 08/22/13 1754   AST 27  ALT 35  ALKPHOS 82  BILITOT 0.7  PROT 8.4*  ALBUMIN 5.1    Recent Labs Lab 08/22/13 1754  LIPASE 10*   No results found for this basename: AMMONIA,  in the last 168 hours CBC:  Recent Labs Lab 08/22/13 1754  WBC 32.6*  HGB 16.5  HCT 45.8  MCV 85.1  PLT 360   Cardiac Enzymes: No results found for this basename: CKTOTAL, CKMB, CKMBINDEX, TROPONINI,  in the last 168 hours  BNP (last 3 results) No results found for this basename: PROBNP,  in the last 8760 hours CBG:  Recent Labs Lab 08/22/13 1701 08/22/13 1931  GLUCAP >600* >600*    Radiological Exams on Admission: Dg Chest Port 1 View  08/22/2013   CLINICAL DATA:  Tachypnea  EXAM: PORTABLE CHEST - 1 VIEW  COMPARISON:  August 20, 2010  FINDINGS: The lungs are mildly hyperexpanded but clear. Heart size and pulmonary vascularity are normal. No adenopathy. No bone lesions. No pneumothorax.  IMPRESSION: No edema or consolidation.   Electronically Signed   By: Bretta Bang   On: 08/22/2013 20:06    EKG: Independently reviewed.  Assessment/Plan Principal Problem:   DKA, type 1 Active Problems:   Thyroiditis, autoimmune   DM type 1 with diabetic peripheral neuropathy   SIRS (systemic inflammatory response syndrome)   AKI (acute kidney injury)   1. DKA type 1 - on DKA pathway, insulin gtt 2. SIRS - work up for source continues, CXR unimpressive, BCL ordered, on empiric zosyn and vanc given profound tachycardia and WBC of 32k which is much too high for simple de margination.  Lactate pending,   Aggressive fluid resuscitation, already received 3L NS in ED, more with DKA protocol at 150.  Tele monitor for tachycardia.  Slightly elevated risk of development of C.Diff in this patient in the coming days-weeks as his grand-mother (who he did not have much close contact with) did develop C.Diff some time ago, discussed with family. 3. AKI - almost certainly pre-renal, intake and output ordered, patient already  making more urine after 3L IVF in ED. 4. Autoimmune Thyroiditis - on replacement, check TSH given tachycardia.    Code Status: Full Code (must indicate code status--if unknown or must be presumed, indicate so) Family Communication: Spoke with parents at bedside, all questions answered (indicate person spoken with, if applicable, with phone number if by telephone) Disposition Plan: Admit to inpatient (indicate anticipated LOS)  Time spent: 70 min  Arnesha Schiraldi M. [facility] Pager [phone]  If 7PM-7AM, please contact night-coverage [email or web address] Password [email or web address] 08/22/2013, 8:20 PM

## 2013-08-22 NOTE — ED Provider Notes (Signed)
Complains of vomiting nausea and crampy abdominal pain and sore throat onset approximately 12 hours ago. Exam patient is alert mildly ill-appearing left to come score 15 extremities dry Patient noted to be in diabetic ketoacidosis plan intravenous fluids intravenous insulin,.  Doug Sou, MD 08/22/13 Windell Moment

## 2013-08-22 NOTE — ED Notes (Signed)
Pt here with hyperglycemia x 12 hours with N/V; pt type 1 DM

## 2013-08-22 NOTE — ED Provider Notes (Signed)
CSN: [id number]     Arrival date & time 08/22/13  1650 History   First MD Initiated Contact with Patient 08/22/13 1710     Chief Complaint  Patient presents with  . Hyperglycemia   (Consider location/radiation/quality/duration/timing/severity/associated sxs/prior Treatment) HPI  Past Medical History  Diagnosis Date  . Type 1 diabetes mellitus not at goal   . Hypoglycemia associated with diabetes   . Goiter   . Thyroiditis, autoimmune   . Hypothyroidism, acquired, autoimmune   . Type 1 diabetes mellitus with diabetic autonomic neuropathy   . Tachycardia   . DM type 1 with diabetic peripheral neuropathy   . Hypertension   . Hypogonadotropic hypogonadism    Past Surgical History  Procedure Laterality Date  . None     Family History  Problem Relation Age of Onset  . Diabetes Maternal Uncle   . Hypertension Maternal Grandmother   . Thyroid disease Neg Hx   . Obesity Neg Hx   . Kidney disease Neg Hx   . Cancer Neg Hx   . Anemia Neg Hx    History  Substance Use Topics  . Smoking status: Never Smoker   . Smokeless tobacco: Never Used  . Alcohol Use: No    Review of Systems  Allergies  Review of patient's allergies indicates no known allergies.  Home Medications   Current Outpatient Rx  Name  Route  Sig  Dispense  Refill  . escitalopram (LEXAPRO) 10 MG tablet   Oral   Take 10 mg by mouth daily.         . insulin aspart (NOVOLOG) 100 UNIT/ML injection      300 units in insulin pump every 48 to 72 hours and per Protocols for Hyperglycemia & DKA Outpatient Treatment.   12 vial   1   . levothyroxine (SYNTHROID, LEVOTHROID) 25 MCG tablet   Oral   Take 25 mcg by mouth daily before breakfast.         . lisinopril (PRINIVIL,ZESTRIL) 10 MG tablet   Oral   Take 1 tablet (10 mg total) by mouth daily.   90 tablet   1   . glucagon (GLUCAGON EMERGENCY) 1 MG injection      Inject 1 mg intramuscularly in thigh 1 time for severe hypoglycemia if unresponsive,  unconscious, unable to swallow or has a seizure.   2 kit   3   . glucose blood (BAYER CONTOUR NEXT TEST) test strip      Use as instructed to check your blood sugar up to 6x daily. Please provide #90 day supply.   600 each   1   . EXPIRED: insulin glargine (LANTUS) 100 UNIT/ML injection      Use as directed by physician for back up if insulin pump fails.          . Insulin Pen Needle 31G X 5 MM MISC      BD Pen Needles- brand specific Inject insulin via insulin pen 7 x daily   250 each   3     For questions regarding this prescription please c ...    BP 95/53  Pulse 122  Temp(Src) 98.6 F (37 C) (Oral)  Resp 18  SpO2 100% Physical Exam  ED Course  Procedures (including critical care time) Labs Review Labs Reviewed  GLUCOSE, CAPILLARY - Abnormal; Notable for the following:    Glucose-Capillary >600 (*)    All other components within normal limits  CBC - Abnormal; Notable for the following:  WBC 32.6 (*)    All other components within normal limits  COMPREHENSIVE METABOLIC PANEL - Abnormal; Notable for the following:    Sodium 133 (*)    Potassium 5.9 (*)    Chloride 88 (*)    CO2 8 (*)    Glucose, Bld 711 (*)    BUN 25 (*)    Creatinine, Ser 1.39 (*)    Total Protein 8.4 (*)    GFR calc non Af Amer 71 (*)    GFR calc Af Amer 83 (*)    All other components within normal limits  LIPASE, BLOOD - Abnormal; Notable for the following:    Lipase 10 (*)    All other components within normal limits  URINALYSIS, ROUTINE W REFLEX MICROSCOPIC - Abnormal; Notable for the following:    APPearance HAZY (*)    Glucose, UA >1000 (*)    Hgb urine dipstick TRACE (*)    Bilirubin Urine SMALL (*)    Ketones, ur >80 (*)    All other components within normal limits  URINE MICROSCOPIC-ADD ON  KETONES, QUALITATIVE   Imaging Review No results found.  MDM  No diagnosis found. Please delete. Duplicate note    Doug Sou, MD 08/22/13 2100

## 2013-08-23 DIAGNOSIS — E1110 Type 2 diabetes mellitus with ketoacidosis without coma: Secondary | ICD-10-CM

## 2013-08-23 LAB — BASIC METABOLIC PANEL
BUN: 15 mg/dL (ref 6–23)
BUN: 13 mg/dL (ref 6–23)
BUN: 12 mg/dL (ref 6–23)
BUN: 10 mg/dL (ref 6–23)
BUN: 7 mg/dL (ref 6–23)
CO2: 10 mEq/L — CL (ref 19–32)
CO2: 12 mEq/L — ABNORMAL LOW (ref 19–32)
CO2: 14 mEq/L — ABNORMAL LOW (ref 19–32)
CO2: 17 mEq/L — ABNORMAL LOW (ref 19–32)
CO2: 19 mEq/L (ref 19–32)
CO2: 18 mEq/L — ABNORMAL LOW (ref 19–32)
Calcium: 8.4 mg/dL (ref 8.4–10.5)
Calcium: 8.2 mg/dL — ABNORMAL LOW (ref 8.4–10.5)
Calcium: 8.2 mg/dL — ABNORMAL LOW (ref 8.4–10.5)
Calcium: 8.5 mg/dL (ref 8.4–10.5)
Chloride: 102 mEq/L (ref 96–112)
Chloride: 106 mEq/L (ref 96–112)
Chloride: 104 mEq/L (ref 96–112)
Chloride: 103 mEq/L (ref 96–112)
Chloride: 104 mEq/L (ref 96–112)
Chloride: 105 mEq/L (ref 96–112)
Creatinine, Ser: 1.06 mg/dL (ref 0.50–1.35)
Creatinine, Ser: 0.91 mg/dL (ref 0.50–1.35)
Creatinine, Ser: 0.89 mg/dL (ref 0.50–1.35)
Creatinine, Ser: 0.8 mg/dL (ref 0.50–1.35)
GFR calc Af Amer: >90 mL/min (ref >90)
GFR calc Af Amer: >90 mL/min (ref >90)
GFR calc Af Amer: >90 mL/min (ref >90)
GFR calc Af Amer: >90 mL/min (ref >90)
GFR calc non Af Amer: >90 mL/min (ref >90)
GFR calc non Af Amer: >90 mL/min (ref >90)
GFR calc non Af Amer: >90 mL/min (ref >90)
GFR calc non Af Amer: >90 mL/min (ref >90)
Glucose, Bld: 193 mg/dL — ABNORMAL HIGH (ref 70–99)
Glucose, Bld: 142 mg/dL — ABNORMAL HIGH (ref 70–99)
Glucose, Bld: 97 mg/dL (ref 70–99)
Glucose, Bld: 139 mg/dL — ABNORMAL HIGH (ref 70–99)
Glucose, Bld: 182 mg/dL — ABNORMAL HIGH (ref 70–99)
Potassium: 3.7 mEq/L (ref 3.5–5.1)
Potassium: 3.7 mEq/L (ref 3.5–5.1)
Potassium: 3.9 mEq/L (ref 3.5–5.1)
Potassium: 3.4 mEq/L — ABNORMAL LOW (ref 3.5–5.1)
Sodium: 133 mEq/L — ABNORMAL LOW (ref 135–145)
Sodium: 134 mEq/L — ABNORMAL LOW (ref 135–145)
Sodium: 132 mEq/L — ABNORMAL LOW (ref 135–145)

## 2013-08-23 LAB — GLUCOSE, CAPILLARY
Glucose-Capillary: 152 mg/dL — ABNORMAL HIGH (ref 70–99)
Glucose-Capillary: 151 mg/dL — ABNORMAL HIGH (ref 70–99)
Glucose-Capillary: 132 mg/dL — ABNORMAL HIGH (ref 70–99)
Glucose-Capillary: 175 mg/dL — ABNORMAL HIGH (ref 70–99)
Glucose-Capillary: 168 mg/dL — ABNORMAL HIGH (ref 70–99)
Glucose-Capillary: 172 mg/dL — ABNORMAL HIGH (ref 70–99)
Glucose-Capillary: 158 mg/dL — ABNORMAL HIGH (ref 70–99)

## 2013-08-23 LAB — CBC
HCT: 38.4 % — ABNORMAL LOW (ref 39.0–52.0)
Hemoglobin: 14.2 g/dL (ref 13.0–17.0)
MCV: 83.5 fL (ref 78.0–100.0)
Platelets: 269 10*3/uL (ref 150–400)
RBC: 4.6 MIL/uL (ref 4.22–5.81)
RDW: 13 % (ref 11.5–15.5)
WBC: 21.1 10*3/uL — ABNORMAL HIGH (ref 4.0–10.5)

## 2013-08-23 MED ORDER — INSULIN ASPART 100 UNIT/ML ~~LOC~~ SOLN: 0.0000 [IU] | Freq: Every day | SUBCUTANEOUS | Status: DC

## 2013-08-23 MED ORDER — INSULIN DETEMIR 100 UNIT/ML ~~LOC~~ SOLN
18.0000 [IU] | Freq: Once | SUBCUTANEOUS | Status: AC
  Administered 2013-08-23: 18 [IU] via SUBCUTANEOUS
  Filled 2013-08-23: qty 0.18

## 2013-08-23 MED ORDER — INSULIN ASPART 100 UNIT/ML ~~LOC~~ SOLN
4.0000 [IU] | Freq: Three times a day (TID) | SUBCUTANEOUS | Status: DC
  Administered 2013-08-24: 4 [IU] via SUBCUTANEOUS

## 2013-08-23 MED ORDER — SODIUM CHLORIDE 0.9 % IV SOLN
INTRAVENOUS | Status: DC
  Administered 2013-08-23: 17:00:00 via INTRAVENOUS

## 2013-08-23 MED ORDER — SENNOSIDES-DOCUSATE SODIUM 8.6-50 MG PO TABS
1.0000 | ORAL_TABLET | Freq: Two times a day (BID) | ORAL | Status: DC
  Administered 2013-08-24 – 2013-08-25 (×3): 1 via ORAL
  Filled 2013-08-23 (×5): qty 1

## 2013-08-23 MED ORDER — INSULIN ASPART 100 UNIT/ML ~~LOC~~ SOLN
0.0000 [IU] | Freq: Three times a day (TID) | SUBCUTANEOUS | Status: DC
  Administered 2013-08-23 – 2013-08-24 (×2): 4 [IU] via SUBCUTANEOUS

## 2013-08-23 MED ORDER — POTASSIUM CHLORIDE 10 MEQ/100ML IV SOLN
10.0000 meq | INTRAVENOUS | Status: AC
  Administered 2013-08-23 (×2): 10 meq via INTRAVENOUS
  Filled 2013-08-23: qty 100

## 2013-08-23 NOTE — Progress Notes (Signed)
Utilization Review completed.  

## 2013-08-23 NOTE — Plan of Care (Signed)
Problem: Phase I Progression Outcomes Goal: CBGs steadily decreasing on IV insulin drip Outcome: Progressing CBG's decreasing approppiately Goal: Acidosis resolving Outcome: Progressing Very slowly co2 10

## 2013-08-23 NOTE — Progress Notes (Signed)
[facility] Progress Note [place] TEAM 1 - Stepdown/ICU RENARDO CHEATUM Dowers WUJ:[id number] DOB: [birth year] DOA: 08/22/2013 PCP: Nonnie Done., MD  Admit HPI / Brief Narrative: 22 y.o. male w/ h/o DM1 on insulin pump at home who presented to ED complaining of gradual onset worsening N/V with associated hyperglycemia. Patient ate at a fast food establishment, began to feel poorly at 5 pm and began vomiting at 3 AM. CBG was climbing throughout the day. He called his Endocrinologist who promptly told him to come to the ED. In the ED the patient was found to be in DKA.  Assessment/Plan:  DKA in DM type 1 Anion gap now 12 w/ bicarb 18 - will begin plan to transition off insulin gtt - utilize levemir insulin for transition - consider restarting pump in AM if pt less lethargic and more interested in eating   Hypothyroid s/p Hashimoto's thyroiditis Cont synthroid   Depression Cont home medical regimen  Code Status: FULL Family Communication: spoke at length with mother and father at bedside  Disposition Plan: SDU  Consultants: none  Procedures: none  Antibiotics: Zosyn 9/20 Vanc 9/20  DVT prophylaxis: Subcutaneous heparin  HPI/Subjective: The patient is somnolent.  He awakens easily but simply chooses not to participate in the conversation.  He denies specific physical complaints at the present time.  A very lengthy discussion was carried out with the patient's mother and father at the bedside concerning the nature of DKA and the extreme danger it represents.  Objective: Blood pressure 106/65, pulse 110, temperature 99.3 F (37.4 C), temperature source Oral, resp. rate 20, height 5\' 7"  (1.702 m), weight 58.5 kg (128 lb 15.5 oz), SpO2 99.00%.  Intake/Output Summary (Last 24 hours) at 08/23/13 1428 Last data filed at 08/23/13 1100  Gross per 24 hour  Intake 2014.12 ml  Output    700 ml  Net 1314.12 ml    Exam: General: No acute respiratory distress Lungs:  Clear to auscultation bilaterally without wheezes or crackles Cardiovascular: Regular rate and rhythm without murmur gallop or rub normal S1 and S2 Abdomen: Nontender, nondistended, soft, bowel sounds positive, no rebound, no ascites, no appreciable mass Extremities: No significant cyanosis, clubbing, or edema bilateral lower extremities  Data Reviewed: Basic Metabolic Panel:  Recent Labs Lab 08/23/13 0240 08/23/13 0530 08/23/13 0835 08/23/13 1119 08/23/13 1201  NA 134* 132* 133* 133* 134*  K 3.7 3.7 3.9 4.1 3.7  CL 104 106 104 103 104  CO2 12* 14* 17* 19 18*  GLUCOSE 193* 142* 97 139* 182*  BUN 18 15 13 12 10   CREATININE 0.99 0.91 0.91 0.89 0.80  CALCIUM 8.4 7.9* 8.9 8.2* 8.2*   Liver Function Tests:  Recent Labs Lab 08/22/13 1754  AST 27  ALT 35  ALKPHOS 82  BILITOT 0.7  PROT 8.4*  ALBUMIN 5.1    Recent Labs Lab 08/22/13 1754  LIPASE 10*   CBC:  Recent Labs Lab 08/22/13 1754 08/23/13 0240  WBC 32.6* 21.1*  HGB 16.5 14.2  HCT 45.8 38.4*  MCV 85.1 83.5  PLT 360 269   CBG:  Recent Labs Lab 08/23/13 0725 08/23/13 0832 08/23/13 0939 08/23/13 1056 08/23/13 1203  GLUCAP 132* 100* 84 118* 175*    Recent Results (from the past 240 hour(s))  MRSA PCR SCREENING     Status: None   Collection Time    08/23/13 12:37 AM      Result Value Range Status   MRSA by PCR NEGATIVE  NEGATIVE  Final   Comment:            The GeneXpert MRSA Assay (FDA     approved for NASAL specimens     only), is one component of a     comprehensive MRSA colonization     surveillance program. It is not     intended to diagnose MRSA     infection nor to guide or     monitor treatment for     MRSA infections.     Studies:  Recent x-ray studies have been reviewed in detail by the Attending Physician  Scheduled Meds:  Scheduled Meds: . escitalopram  10 mg Oral Daily  . heparin  5,000 Units Subcutaneous Q8H  . levothyroxine  25 mcg Oral QAC breakfast  . lisinopril  10  mg Oral Daily    Time spent on care of this patient: 35 mins   [facility] T  [facility] Office  [phone] Pager - Text Page per Loretha Stapler as per below:  On-Call/Text Page:      [email or web address]      password [email or web address]  If 7PM-7AM, please contact night-coverage [email or web address] Password [email or web address] 08/23/2013, 2:28 PM   LOS: 1 day

## 2013-08-24 LAB — GLUCOSE, CAPILLARY
Glucose-Capillary: 114 mg/dL — ABNORMAL HIGH (ref 70–99)
Glucose-Capillary: 121 mg/dL — ABNORMAL HIGH (ref 70–99)
Glucose-Capillary: 133 mg/dL — ABNORMAL HIGH (ref 70–99)
Glucose-Capillary: 140 mg/dL — ABNORMAL HIGH (ref 70–99)
Glucose-Capillary: 142 mg/dL — ABNORMAL HIGH (ref 70–99)
Glucose-Capillary: 160 mg/dL — ABNORMAL HIGH (ref 70–99)
Glucose-Capillary: 165 mg/dL — ABNORMAL HIGH (ref 70–99)
Glucose-Capillary: 170 mg/dL — ABNORMAL HIGH (ref 70–99)
Glucose-Capillary: 175 mg/dL — ABNORMAL HIGH (ref 70–99)
Glucose-Capillary: 184 mg/dL — ABNORMAL HIGH (ref 70–99)
Glucose-Capillary: 187 mg/dL — ABNORMAL HIGH (ref 70–99)
Glucose-Capillary: 74 mg/dL (ref 70–99)

## 2013-08-24 LAB — BASIC METABOLIC PANEL
BUN: 4 mg/dL — ABNORMAL LOW (ref 6–23)
BUN: 4 mg/dL — ABNORMAL LOW (ref 6–23)
CO2: 18 mEq/L — ABNORMAL LOW (ref 19–32)
CO2: 23 mEq/L (ref 19–32)
CO2: 24 mEq/L (ref 19–32)
Calcium: 8.5 mg/dL (ref 8.4–10.5)
Chloride: 101 mEq/L (ref 96–112)
Creatinine, Ser: 0.69 mg/dL (ref 0.50–1.35)
Creatinine, Ser: 0.59 mg/dL (ref 0.50–1.35)
GFR calc Af Amer: >90 mL/min (ref >90)
GFR calc Af Amer: >90 mL/min (ref >90)
GFR calc non Af Amer: >90 mL/min (ref >90)
Glucose, Bld: 67 mg/dL — ABNORMAL LOW (ref 70–99)
Glucose, Bld: 174 mg/dL — ABNORMAL HIGH (ref 70–99)
Potassium: 3.1 mEq/L — ABNORMAL LOW (ref 3.5–5.1)
Potassium: 2.9 mEq/L — ABNORMAL LOW (ref 3.5–5.1)
Sodium: 136 mEq/L (ref 135–145)
Sodium: 138 mEq/L (ref 135–145)
Sodium: 138 mEq/L (ref 135–145)

## 2013-08-24 MED ORDER — SODIUM CHLORIDE 0.9 % IJ SOLN: 10.0000 mL | INTRAMUSCULAR | Status: DC | PRN

## 2013-08-24 MED ORDER — SODIUM CHLORIDE 0.9 % IV SOLN
INTRAVENOUS | Status: DC
  Administered 2013-08-25: via INTRAVENOUS

## 2013-08-24 MED ORDER — DEXTROSE 50 % IV SOLN: 50.0000 mL | Freq: Once | INTRAVENOUS | Status: DC | PRN

## 2013-08-24 MED ORDER — DEXTROSE 50 % IV SOLN: 50.0000 mL | INTRAVENOUS | Status: DC | PRN

## 2013-08-24 MED ORDER — INSULIN GLARGINE 100 UNIT/ML ~~LOC~~ SOLN
15.0000 [IU] | Freq: Every day | SUBCUTANEOUS | Status: DC
  Administered 2013-08-25: 15 [IU] via SUBCUTANEOUS
  Filled 2013-08-24 (×2): qty 0.15

## 2013-08-24 MED ORDER — SODIUM CHLORIDE 0.9 % IV SOLN
INTRAVENOUS | Status: DC
  Administered 2013-08-24: 8 [IU]/h via INTRAVENOUS
  Administered 2013-08-24: 4.9 [IU]/h via INTRAVENOUS
  Administered 2013-08-24: 0.6 [IU]/h via INTRAVENOUS
  Administered 2013-08-24: 1.6 [IU]/h via INTRAVENOUS
  Filled 2013-08-24: qty 1

## 2013-08-24 MED ORDER — SODIUM CHLORIDE 0.9 % IJ SOLN
10.0000 mL | Freq: Two times a day (BID) | INTRAMUSCULAR | Status: DC
  Administered 2013-08-24: 10 mL

## 2013-08-24 MED ORDER — POTASSIUM CHLORIDE CRYS ER 20 MEQ PO TBCR
40.0000 meq | EXTENDED_RELEASE_TABLET | ORAL | Status: AC
  Administered 2013-08-24 – 2013-08-25 (×2): 40 meq via ORAL
  Filled 2013-08-24 (×2): qty 2

## 2013-08-24 MED ORDER — GLUCOSE 40 % PO GEL: 1.0000 | ORAL | Status: DC | PRN

## 2013-08-24 MED ORDER — INSULIN ASPART 100 UNIT/ML ~~LOC~~ SOLN
0.0000 [IU] | Freq: Three times a day (TID) | SUBCUTANEOUS | Status: DC
  Administered 2013-08-25 (×2): 2 [IU] via SUBCUTANEOUS

## 2013-08-24 MED ORDER — DEXTROSE-NACL 5-0.45 % IV SOLN
INTRAVENOUS | Status: DC
  Administered 2013-08-24: 75 mL/h via INTRAVENOUS

## 2013-08-24 MED ORDER — DEXTROSE 50 % IV SOLN: 25.0000 mL | Freq: Once | INTRAVENOUS | Status: DC | PRN

## 2013-08-24 NOTE — Progress Notes (Signed)
Inpatient Diabetes Program Recommendations  AACE/ADA: New Consensus Statement on Inpatient Glycemic Control (2013)  Target Ranges:  Prepandial:   less than 140 mg/dL      Peak postprandial:   less than 180 mg/dL (1-2 hours)      Critically ill patients:  140 - 180 mg/dL   Reason for Visit: Admitted with DKA.  Uses insulin pump at home.  Note:  Patient's Endocrinologist is Dr. Molli Knock.  Patient typically uses an insulin pump at home.   Basal Settings are as follows:  12 midnight  = 0.85              0500 = 0.85   0900 = 0.90   1200 = 1.05   1800 = 1.6   2100 = 1.3  Total Basal rate = 25.35 units/24 hours  Bolus Wizard Settings as follows:  Insulin to CHO ratio settings  12 midnight  = 1:15             1130  = 1:12   0430 = 1:15      Sensitivity = 65   Target range   12 midnight  = 150             0600  = 110             2200  = 150  Patient does own insulin pump site changes, cartridge filling.  Site last changed on Friday, September 19th.  Has multiple partial vials in the refridgerator, so not sure which vial he used or how long it had been opened.  Parents tried to manage hyperglycemia at home without success-- even used an insulin FlexPen to administer insulin. Parents and patient feel strongly that DKA is a result of "bad food" or a virus.    Patient and family have been told Dr. Fransico Cornelis will continue to see him-- even as an adult. Thank you.  Kenidee Cregan S. Elsie Lincoln, RN, CNS, CDE Inpatient Diabetes Program, team pager [phone]

## 2013-08-24 NOTE — Progress Notes (Addendum)
[facility] Progress Note [name] [id number]:[id number] DOB: 1991-07-14 DOA: 08/22/2013 PCP: Nonnie Done., MD  Admit HPI / Brief Narrative: 22 y.o. male w/ h/o DM1 on insulin pump at home who presented to ED complaining of gradual onset worsening N/V with associated hyperglycemia. Patient ate at a fast food establishment, began to feel poorly at 5 pm and began vomiting at 3 AM. CBG was climbing throughout the day. He called his Endocrinologist who promptly told him to come to the ED. In the ED the patient was found to be in DKA.  Assessment/Plan:  DKA in DM type 1 Anion gap has widened again - poor oral intake - resume insulin drip - follow frequent BMETs - PICC line for frequent blood draws  Hypothyroid s/p Hashimoto's thyroiditis Cont synthroid   Depression Cont home medical regimen  Code Status: FULL Family Communication: spoke at length with mother and father in hallway Disposition Plan: SDU  Consultants: none  Procedures: none  Antibiotics: Zosyn 9/20 Vanc 9/20  DVT prophylaxis: Subcutaneous heparin  HPI/Subjective: The patient is more alert today.  He complains of sore throat from frequent prior episodes of vomiting.  He denies abdominal pain.  He denies chest pain or shortness of breath.  Objective: Blood pressure 118/79, pulse 114, temperature 98.8 F (37.1 C), temperature source Oral, resp. rate 15, height 5\' 7"  (1.702 m), weight 58.5 kg (128 lb 15.5 oz), SpO2 100.00%.  Intake/Output Summary (Last 24 hours) at 08/24/13 1301 Last data filed at 08/24/13 1200  Gross per 24 hour  Intake 2161.67 ml  Output    975 ml  Net 1186.67 ml    Exam: General: No acute respiratory distress Lungs: Clear to auscultation bilaterally without wheezes or crackles Cardiovascular: Regular rate and rhythm without murmur gallop or rub  Abdomen: Nontender, nondistended, soft, bowel sounds positive, no rebound, no ascites, no  appreciable mass Extremities: No significant cyanosis, clubbing, or edema bilateral lower extremities  Data Reviewed: Basic Metabolic Panel:  Recent Labs Lab 08/23/13 0835 08/23/13 1119 08/23/13 1201 08/23/13 2050 08/24/13 0413  NA 133* 133* 134* 136 136  K 3.9 4.1 3.7 3.4* 3.6  CL 104 103 104 105 103  CO2 17* 19 18* 19 18*  GLUCOSE 97 139* 182* 94 135*  BUN 13 12 10 7  5*  CREATININE 0.91 0.89 0.80 0.72 0.69  CALCIUM 8.9 8.2* 8.2* 8.5 8.5   Liver Function Tests:  Recent Labs Lab 08/22/13 1754  AST 27  ALT 35  ALKPHOS 82  BILITOT 0.7  PROT 8.4*  ALBUMIN 5.1    Recent Labs Lab 08/22/13 1754  LIPASE 10*   CBC:  Recent Labs Lab 08/22/13 1754 08/23/13 0240  WBC 32.6* 21.1*  HGB 16.5 14.2  HCT 45.8 38.4*  MCV 85.1 83.5  PLT 360 269   CBG:  Recent Labs Lab 08/23/13 1703 08/23/13 1810 08/23/13 2212 08/24/13 0615 08/24/13 0753  GLUCAP 164* 158* 78 170* 184*    Recent Results (from the past 240 hour(s))  CULTURE, BLOOD (ROUTINE X 2)     Status: None   Collection Time    08/22/13  8:05 PM      Result Value Range Status   Specimen Description BLOOD LEFT HAND   Final   Special Requests BOTTLES DRAWN AEROBIC ONLY [facility]   Final   Culture  Setup Time     Final   Value: 08/23/2013 18:10     Performed at [facility]   Culture     Final   Value:        BLOOD CULTURE RECEIVED NO GROWTH TO DATE CULTURE WILL BE HELD FOR 5 DAYS BEFORE ISSUING A FINAL NEGATIVE REPORT     Performed at [facility]   Report Status PENDING   Incomplete  CULTURE, BLOOD (ROUTINE X 2)     Status: None   Collection Time    08/22/13  8:13 PM      Result Value Range Status   Specimen Description BLOOD RIGHT HAND   Final   Special Requests BOTTLES DRAWN AEROBIC ONLY [facility]   Final   Culture  Setup Time     Final   Value: 08/23/2013 18:10     Performed at [facility]   Culture     Final   Value:        BLOOD CULTURE RECEIVED NO GROWTH TO DATE CULTURE WILL BE  HELD FOR 5 DAYS BEFORE ISSUING A FINAL NEGATIVE REPORT     Performed at [facility]   Report Status PENDING   Incomplete  MRSA PCR SCREENING     Status: None   Collection Time    08/23/13 12:37 AM      Result Value Range Status   MRSA by PCR NEGATIVE  NEGATIVE Final   Comment:            The GeneXpert MRSA Assay (FDA     approved for NASAL specimens     only), is one component of a     comprehensive MRSA colonization     surveillance program. It is not     intended to diagnose MRSA     infection nor to guide or     monitor treatment for     MRSA infections.     Studies:  Recent x-ray studies have been reviewed in detail by the Attending Physician  Scheduled Meds:  Scheduled Meds: . escitalopram  10 mg Oral Daily  . heparin  5,000 Units Subcutaneous Q8H  . levothyroxine  25 mcg Oral QAC breakfast  . lisinopril  10 mg Oral Daily  . senna-docusate  1 tablet Oral BID    Time spent on care of this patient: 35 mins   [facility] T  [facility] Office  [phone] Pager - Text Page per Loretha Stapler as per below:  On-Call/Text Page:      [email or web address]      password [email or web address]  If 7PM-7AM, please contact night-coverage [email or web address] Password [email or web address] 08/24/2013, 1:01 PM   LOS: 2 days

## 2013-08-24 NOTE — Progress Notes (Signed)
Peripherally Inserted Central Catheter/Midline Placement  The IV Nurse has discussed with the patient and/or persons authorized to consent for the patient, the purpose of this procedure and the potential benefits and risks involved with this procedure.  The benefits include less needle sticks, lab draws from the catheter and patient may be discharged home with the catheter.  Risks include, but not limited to, infection, bleeding, blood clot (thrombus formation), and puncture of an artery; nerve damage and irregular heat beat.  Alternatives to this procedure were also discussed.  PICC/Midline Placement Documentation  PICC / Midline Double Lumen 08/24/13 PICC Right Basilic (Active)       Scott Moran 08/24/2013, 8:45 PM

## 2013-08-25 ENCOUNTER — Telehealth: Payer: Self-pay | Admitting: "Endocrinology

## 2013-08-25 LAB — BASIC METABOLIC PANEL
BUN: 3 mg/dL — ABNORMAL LOW (ref 6–23)
BUN: 4 mg/dL — ABNORMAL LOW (ref 6–23)
CO2: 26 mEq/L (ref 19–32)
CO2: 26 mEq/L (ref 19–32)
Calcium: 8.4 mg/dL (ref 8.4–10.5)
Chloride: 104 mEq/L (ref 96–112)
Creatinine, Ser: 0.6 mg/dL (ref 0.50–1.35)
Creatinine, Ser: 0.58 mg/dL (ref 0.50–1.35)
GFR calc Af Amer: >90 mL/min (ref >90)
GFR calc Af Amer: >90 mL/min (ref >90)
Glucose, Bld: 163 mg/dL — ABNORMAL HIGH (ref 70–99)
Glucose, Bld: 145 mg/dL — ABNORMAL HIGH (ref 70–99)
Potassium: 3 mEq/L — ABNORMAL LOW (ref 3.5–5.1)
Sodium: 144 mEq/L (ref 135–145)

## 2013-08-25 LAB — CBC
MCH: 30.4 pg (ref 26.0–34.0)
MCHC: 36.6 g/dL — ABNORMAL HIGH (ref 30.0–36.0)
MCV: 83 fL (ref 78.0–100.0)
Platelets: 169 10*3/uL (ref 150–400)
RBC: 4.18 MIL/uL — ABNORMAL LOW (ref 4.22–5.81)

## 2013-08-25 LAB — GLUCOSE, CAPILLARY
Glucose-Capillary: 151 mg/dL — ABNORMAL HIGH (ref 70–99)
Glucose-Capillary: 151 mg/dL — ABNORMAL HIGH (ref 70–99)

## 2013-08-25 LAB — URINE CULTURE
Colony Count: NO GROWTH
Culture: NO GROWTH

## 2013-08-25 NOTE — Progress Notes (Signed)
Right arm PICC dc'd per order for discharge to home - pressure to site x 5 minutes - vaseline gauze dressing to site - instructed to keep dsg on x 24 hours then can remove and replace with Band-aid as needed. Parents present for instructuions.

## 2013-08-25 NOTE — Progress Notes (Signed)
Inpatient Diabetes Program Recommendations  AACE/ADA: New Consensus Statement on Inpatient Glycemic Control (2013)  Target Ranges:  Prepandial:   less than 140 mg/dL      Peak postprandial:   less than 180 mg/dL (1-2 hours)      Critically ill patients:  140 - 180 mg/dL   Reason for Visit: Support  Note:  Visit to patient/parents in room.  Lab work improved this morning.  Patient and mother state that they have all pump supplies needed to restart pump therapy.  Stressed that every aspect of pump therapy needs to be fresh-- new site, new set, new reservoir, new insulin.  Asked that they use hospital Novolog for the reservoir.  Advised them of the insulin pump contract and CHO/Bolus Flowsheet.  Informed them that the insulin drip will continue one hour after pump is restarted, and then the drip will be stopped.  Thank you.  Arihana Ambrocio S. Elsie Lincoln, RN, CNS, CDE Inpatient Diabetes Program, team pager [phone]

## 2013-08-25 NOTE — Progress Notes (Signed)
Patient being discharged home per MD . All discharge orders gone over with patient and parents, voiced understanding. Discharge paperwork sent home with patient.

## 2013-08-25 NOTE — Progress Notes (Signed)
Patient for discharge home.  Copied storage information taken from a Novolog insulin package insert outlining expiration of the vial or pen 28 days from puncturing of vial or use of a pen.  Discussed with patient and parents need for a system so he will know opening dates of Novolog vials (or FlexPens) so he can be assured insulin is of expected potency.  Patient received Lantus last night so insulin pump will not be resumed until after midnight tonight at home. Thank you.  Espen Bethel S. Elsie Lincoln, RN, CNS, CDE Inpatient Diabetes Program, team pager [phone]

## 2013-08-25 NOTE — Progress Notes (Signed)
Chaplain responded to referral from nurse. Family requested a chaplain close to the age of their son, the patient. Patient is 22 y.o male who has struggled with diabetes since he was a toddler. Patient lives at home and is not in school. He says he "doesn't like math." Patient preparing to be discharged. Chaplain provided active listening and compassionate presence.  Maurene Capes, Chaplain

## 2013-08-25 NOTE — Telephone Encounter (Signed)
1. I called to check up on Scott Moran. He was asleep, but dad took my call. Scott Moran was discharged this afternoon and arrived home about 4:45 PM. Scott Moran feels much better. 2. I told dad that it's still unclear if Scott Moran has food poisoning or if he had not had enough insulin for days to weeks prior to developing DKA. We can download his BG meter and get a better idea.  3. Scott Moran is scheduled to see me on Friday, 08/28/13 at 11 AM. David Stall

## 2013-08-26 ENCOUNTER — Telehealth: Payer: Self-pay | Admitting: *Deleted

## 2013-08-26 NOTE — Telephone Encounter (Signed)
Returned Mother's voice mail to me: 1. Jonty is home from the hospital. 2. He started back on his pump. Last night.  3. Mom is using Temp Basal rate at 75% x 24 hrs. She verbalized her understanding that to continue his Temp Basal Rate she needs to reset it every 24 hrs. 4. Blood sugars are running between 130 - 183 mg/dl. No hypoglycemia. 5. Salif is not eating much due to sore/raw throat secondary to severe vomiting, and because his is scared that the diarrhea/vomiting might restart.  In the last 24 hours, he has had 1 bottle of Ensure  and 1 cup of yogurt. 6. Mother downloaded his [employer] to [facility]:  User Name is [email or web address]   Password: [phone].  I linked to his [facility] Acct & downloaded blood sugar reports. At [facility] request, I will give them to  Dr. Fransico Chaston to review prior to seeing Marlee on Fri 08/28/13.

## 2013-08-28 ENCOUNTER — Ambulatory Visit (INDEPENDENT_AMBULATORY_CARE_PROVIDER_SITE_OTHER): Payer: BC Managed Care – PPO | Admitting: "Endocrinology

## 2013-08-28 VITALS — BP 113/80 | HR 106 | Wt 127.2 lb

## 2013-08-28 DIAGNOSIS — E1042 Type 1 diabetes mellitus with diabetic polyneuropathy: Secondary | ICD-10-CM

## 2013-08-28 DIAGNOSIS — Z23 Encounter for immunization: Secondary | ICD-10-CM

## 2013-08-28 DIAGNOSIS — E11649 Type 2 diabetes mellitus with hypoglycemia without coma: Secondary | ICD-10-CM

## 2013-08-28 DIAGNOSIS — E038 Other specified hypothyroidism: Secondary | ICD-10-CM

## 2013-08-28 DIAGNOSIS — I498 Other specified cardiac arrhythmias: Secondary | ICD-10-CM

## 2013-08-28 DIAGNOSIS — G909 Disorder of the autonomic nervous system, unspecified: Secondary | ICD-10-CM

## 2013-08-28 DIAGNOSIS — E049 Nontoxic goiter, unspecified: Secondary | ICD-10-CM

## 2013-08-28 DIAGNOSIS — E063 Autoimmune thyroiditis: Secondary | ICD-10-CM

## 2013-08-28 DIAGNOSIS — I10 Essential (primary) hypertension: Secondary | ICD-10-CM

## 2013-08-28 DIAGNOSIS — E1065 Type 1 diabetes mellitus with hyperglycemia: Secondary | ICD-10-CM

## 2013-08-28 DIAGNOSIS — E1043 Type 1 diabetes mellitus with diabetic autonomic (poly)neuropathy: Secondary | ICD-10-CM

## 2013-08-28 DIAGNOSIS — R000 Tachycardia, unspecified: Secondary | ICD-10-CM

## 2013-08-28 DIAGNOSIS — E1049 Type 1 diabetes mellitus with other diabetic neurological complication: Secondary | ICD-10-CM

## 2013-08-28 DIAGNOSIS — E1169 Type 2 diabetes mellitus with other specified complication: Secondary | ICD-10-CM

## 2013-08-28 DIAGNOSIS — R634 Abnormal weight loss: Secondary | ICD-10-CM

## 2013-08-28 NOTE — Patient Instructions (Signed)
Follow up visit in November as planned. Call Dr. Fransico Tahjai next Wednesday evening between 8:00-9:30 PM.

## 2013-08-28 NOTE — Progress Notes (Signed)
Subjective:  Patient Name: Scott Moran Date of Birth: 05-27-1991  MRN: [id number]  Scott Moran  presents to the office today for follow-up of his type 1 diabetes mellitus, goiter, hypoglycemia, depression, hypothyroidism, thyroiditis, diabetic autonomic neuropathy, inappropriate sinus tachycardia, non-proliferative diabetic retinopathy, diabetic peripheral neuropathy, fatigue, hypogonadotropic hypogonadism, and medical non-compliance.  HISTORY OF PRESENT ILLNESS:   Scott Moran is a 22 y.o. Caucasian young man. Scott Moran was unaccompanied.   1. I first saw the patient in consultation on 03/27/05 in the [facility]patient [facility] of [facility] in [place], [place]. He had been referred by his primary care provider, Dr. Egbert Moran, in [place], [place], for evaluation and treatment of poorly controlled type 1 diabetes. The patient was 22 years old.  A. The patient had been diagnosed with type 1 diabetes in February of 1995 at age 51. He was on an insulin pump at that time I first met him. His hemoglobin A1c had increased from 7.1% in December of 2005 to 8.2% in April 2006. He was having frequent snacks that he was not taking boluses for. He was also frequently noncompliant with checking blood sugars and taking meal boluses. I adjusted his basal rates.  B. At his next [facility] visit on 06/19/05, he was still very noncompliant. Mental status was normal. He had 25 g goiter. His father was serving in [place] with the [employer]. There was a lot of stress at home. I again adjusted his basal rates.  C. At the end of July 2006 we closed our satellite office in [place]. The patient elected to have me follow him at our clinic here in [place]. I have followed him here ever since.  2. During the past eight years, the patient has had several clinical issues.   A. T1DM: The patient's blood glucose control has been fairly good for a teenager. His maximum hemoglobin A1c occurred on 04/16/06 when the  hemoglobin A1c was 12.1%. Since then the hemoglobin A1c values have varied between 6.9% and 8.4%. He is still fairly noncompliant with checking blood sugars, bolusing, and changing his insulin pump sites on time, but he is more compliant and more careful than he used to be.   B. Goiter, Hashimoto's thyroiditis, and hypothyroidism: In late 2008 and early 2009 the patient developed hypothyroidism secondary to Hashimoto's disease. I started him on Synthroid, 25 mcg per day. When he takes his medication, he is euthyroid.  C. Autonomic neuropathy and inappropriate sinus tachycardia: In late 2008 he developed autonomic neuropathy which manifested as sinus tachycardia.These problems vary in parallel with his BGs and HbA1c values.  D. Diabetic peripheral neuropathy: The patient has had symptoms and signs of mild diabetic peripheral neuropathy at several times in the past. His neuropathic symptoms similarly vary in parallel with his BGs and HbA1c values.   E. Non-proliferative diabetic retinopathy: This problem was identified during a diabetic eye exam in June 2010.  F. Hypogonadotropic hypogonadism: The patient has always been somewhat lackadaisical and relatively passive. On 06/29/10 his FSH was 4.9, LH was 2.4, testosterone was 399.66, and free testosterone was 79.8. The testosterone and free testosterone were relatively low for an 22 year old young man. At the same time the [facility] and LH while "normal", appeared to be somewhat low for the level of testosterone he had. We discussed the options for treatment including exercise and medication, to include AndroGel. The patient chose the exercise option. By 10/19/10 the [facility] was 6.8, the LH was 3.2, total testosterone was 615.88 and the free testosterone was  131.6. It may be that when he was more depressed he had less gonadotropic function. We will continue to follow up on this issue over time.  G. Depression: The patient has been diagnosed with depression at different times  in the past. For some time he took Remeron (mirtazapine), but discontinued this in early 2012. I have been trying to convince him for some time that he is depressed and that he should seek out help. Thus far, however, he has not usually been willing to admit that he was depressed or that he needed help. [As I learned after his January 15th 2014 visit, he did go to [facility] recently for an evaluation, but did not like how he was treated and so refused to return. His mother gave this information to our diabetes education nurse.] When I talked with Scott Moran at the end of his last [facility] visit about going to [facility] for an initial visit, he made no mention of having gone there before. There is no encounter form or appointment record showing that he ever had an appointment or was seen at [facility]. Perhaps he went to another behavioral health center.  H. 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   3. The patient's last [facility] visit was on 07/01/13. In the interim, he has been healthy.   A. On 08/22/13 he went up to see Dr. Karma Moran colleague. Scott Moran and his mother ate at [facility] in [place]. Later that night he developed severe nausea and vomiting. The family called me and I asked them to take Scott Moran to the ED. He was then hospitalized in the Medical ICU/s Step-Down Unit for DKA. He was discharged on 08/26/13. In retrospect, he had not changed his site for 6 days prior to admission. All BGs in the 24 hours prior to admission were in the 300->400 range.  B. Since returning home, he has been more compliant with his DM regimen. His BGs have been better, but not perfect.  Scott Moran will see Dr. Jerlyn Moran, his therapist, next week.   D. Since starting escitalopram, Scott Moran has felt better and has been more physically active.   Scott Moran  takes his Synthroid dose of 25 mcg/day and lisinopril 10 mg/day.   4. Pertinent Review of Systems:  Constitutional: The patient feels "pretty great and a little more energetic". His mood is better.  Eyes: Vision is good with glasses. There are no significant eye complaints. Last eye exam was about 6 months ago. There were no signs of diabetic retinopathy. Neck: The patient has no complaints of anterior neck swelling, soreness, tenderness,  pressure, discomfort, or difficulty swallowing.  Heart: Heart rate sometimes feels fast. Heart rate increases with exercise or other physical activity. The patient has no complaints of palpitations, irregular heat beats, chest pain, or chest pressure. Gastrointestinal: Bowel movents seem normal. The patient has no complaints of excessive hunger, acid reflux, upset stomach, stomach aches or pains, diarrhea, or constipation. Legs: Muscle mass and strength seem normal. There are no complaints of numbness, tingling, burning, or pain. No edema is noted. Feet: There are no obvious foot problems. There are no complaints of numbness, tingling, burning, or pain. No edema is noted. Hypoglycemia: None Emotional/psychological: The patient is doing better since discharge.   6. BG printout: In the past month his average BG was 297, compared with 231 at  last visit and with 245 at the visit prior. He has had two BGs in the 70s. The patient changed his insertion sites every 5-9 days. Prior to this recent admission he had been checking BGs 0-7 times daily and sometimes went for up to 72 hours without checking BGs. Since discharge, however, he has been checking 3-4 times per day. Prior to this admission he had been bolusing  0-4 times daily and sometimes went for up to 70 hours without bolusing. Since discharge he has been bolusing 3-6 times daily. Not surprisingly, his BGs are less variable and are lower when he checks his BGs and boluses reliably.  PAST MEDICAL, FAMILY, AND SOCIAL  HISTORY:  Past Medical History  Diagnosis Date  . Type 1 diabetes mellitus not at goal   . Hypoglycemia associated with diabetes   . Goiter   . Thyroiditis, autoimmune   . Hypothyroidism, acquired, autoimmune   . Tachycardia   . Hypertension   . Hypogonadotropic hypogonadism   . Type 1 diabetes mellitus with diabetic autonomic neuropathy   . DM type 1 with diabetic peripheral neuropathy     Family History  Problem Relation Age of Onset  . Diabetes Maternal Uncle   . Hypertension Maternal Grandmother   . Thyroid disease Neg Hx   . Obesity Neg Hx   . Kidney disease Neg Hx   . Cancer Neg Hx   . Anemia Neg Hx     Current outpatient prescriptions:escitalopram (LEXAPRO) 10 MG tablet, Take 10 mg by mouth daily., Disp: , Rfl: ;  glucagon (GLUCAGON EMERGENCY) 1 MG injection, Inject 1 mg intramuscularly in thigh 1 time for severe hypoglycemia if unresponsive, unconscious, unable to swallow or has a seizure., Disp: 2 kit, Rfl: 3 glucose blood (BAYER CONTOUR NEXT TEST) test strip, Use as instructed to check your blood sugar up to 6x daily. Please provide #90 day supply., Disp: 600 each, Rfl: 1;  insulin aspart (NOVOLOG) 100 UNIT/ML injection, 300 units in insulin pump every 48 to 72 hours and per Protocols for Hyperglycemia & DKA Outpatient Treatment., Disp: 12 vial, Rfl: 1 Insulin Pen Moran 31G X 5 MM MISC, BD Pen Needles- brand specific Inject insulin via insulin pen 7 x daily, Disp: 250 each, Rfl: 3;  levothyroxine (SYNTHROID, LEVOTHROID) 25 MCG tablet, Take 25 mcg by mouth daily before breakfast., Disp: , Rfl: ;  lisinopril (PRINIVIL,ZESTRIL) 10 MG tablet, Take 10 mg by mouth daily., Disp: , Rfl:   Allergies as of 08/28/2013  . (No Known Allergies)    1. Work and Family: Patient is no longer attending junior college, in part due to family financial problems. Father is now working at a different job and has Programmer, applications, better salary, and better hours. The patient is still not working  and is not interested in looking for work. 2. Activities: Few physical activities. He is a history buff and frequently participates in online chat room discussions.. 3. Smoking, alcohol, or drugs: None 4. Primary Care Provider: He no longer has a primary care provider.  REVIEW OF SYSTEMS: There are no other significant problems involving Judith's other body systems.   Objective:  Vital Signs:  BP 113/80  Pulse 106  Wt 127 lb 3.2 oz (57.698 kg)  BMI 19.92 kg/m2 His weight has decreased 4 pounds since last visit.     Ht Readings from Last 3 Encounters:  08/22/13 5\' 7"  (1.702 m)   Wt Readings from Last 3 Encounters:  08/28/13 127 lb 3.2 oz (57.698 kg)  08/22/13 128 lb 15.5 oz (58.5 kg)  07/01/13 131 lb 6.4 oz (59.603 kg)   PHYSICAL EXAM:  Constitutional: Scott Moran was very bright and alert today. He appears healthy, but slim. He did a non-stop monologue for almost all of the visit.  Face: The face appears normal.  Eyes: There is no obvious arcus or proptosis. Moisture appears normal. Mouth: The oropharynx and tongue appear normal. Oral moisture is normal. Neck: The neck looks normal. No carotid bruits are noted. The thyroid gland is again only minimally enlarged at 20-20+ grams in size. Both lobes are only minimally enlarged. The consistency of the thyroid gland is normal today.The thyroid gland is not tender to palpation. Lungs: The lungs are clear to auscultation. Air movement is good. Heart: Heart rate and rhythm are regular. Heart sounds S1 and S2 are normal. I did not appreciate any pathologic cardiac murmurs. Abdomen: The abdomen is normal in size. Bowel sounds are normal. There is no obvious hepatomegaly, splenomegaly, or other mass effect.  Arms: Muscle size and bulk are normal for age. Hands: There is no obvious tremor. Phalangeal and metacarpophalangeal joints are normal. Palmar muscles are normal. Palmar skin is normal. Palmar moisture is also normal. Legs: Muscles appear  normal for age. No edema is present. Feet: Feet are normally formed. Dorsalis pedal pulses are 1+bilaterally.  Neurologic: Strength is normal for age in both the upper and lower extremities. Muscle tone is normal. Sensation to touch is normal in both legs, but decreased in both heels.     LAB DATA: Hemoglobin A1c was 8.2% today, compared with 7.7% at last visit and with 7.6% at the prior visit.   Results for orders placed in visit on 08/28/13 (from the past 504 hour(s))  GLUCOSE, POCT (MANUAL RESULT ENTRY)   Collection Time    08/28/13 10:46 AM      Result Value Range   POC Glucose 294 (*) 70 - 99 mg/dl  POCT GLYCOSYLATED HEMOGLOBIN (HGB A1C)   Collection Time    08/28/13 10:56 AM      Result Value Range   Hemoglobin A1C 8.2    Results for orders placed during the hospital encounter of 08/22/13 (from the past 504 hour(s))  GLUCOSE, CAPILLARY   Collection Time    08/22/13  5:01 PM      Result Value Range   Glucose-Capillary >600 (*) 70 - 99 mg/dL   Comment 1 Notify RN    URINE CULTURE   Collection Time    08/22/13  5:16 PM      Result Value Range   Specimen Description URINE, CLEAN CATCH     Special Requests ADDED 0122 08/23/13     Culture  Setup Time       Value: 08/23/2013 21:20     Performed at [facility] Count       Value: NO GROWTH     Performed at [facility]   Culture       Value: NO GROWTH     Performed at [facility]   Report Status 08/25/2013 FINAL    URINALYSIS, ROUTINE W REFLEX MICROSCOPIC   Collection Time    08/22/13  5:16 PM      Result Value Range   Color, Urine YELLOW  YELLOW   APPearance HAZY (*) CLEAR   Specific Gravity, Urine 1.025  1.005 - 1.030   pH 5.0  5.0 - 8.0   Glucose, UA >1000 (*) NEGATIVE mg/dL   Hgb  urine dipstick TRACE (*) NEGATIVE   Bilirubin Urine SMALL (*) NEGATIVE   Ketones, ur >80 (*) NEGATIVE mg/dL   Protein, ur NEGATIVE  NEGATIVE mg/dL   Urobilinogen, UA 0.2  0.0 - 1.0 mg/dL   Nitrite  NEGATIVE  NEGATIVE   Leukocytes, UA NEGATIVE  NEGATIVE  URINE MICROSCOPIC-ADD ON   Collection Time    08/22/13  5:16 PM      Result Value Range   Urine-Other       Value: NO FORMED ELEMENTS SEEN ON URINE MICROSCOPIC EXAMINATION  KETONES, QUALITATIVE   Collection Time    08/22/13  5:50 PM      Result Value Range   Acetone, Bld MODERATE (*) NEGATIVE  CBC   Collection Time    08/22/13  5:54 PM      Result Value Range   WBC 32.6 (*) 4.0 - 10.5 K/uL   RBC 5.38  4.22 - 5.81 MIL/uL   Hemoglobin 16.5  13.0 - 17.0 g/dL   HCT 19.1  47.8 - 29.5 %   MCV 85.1  78.0 - 100.0 fL   MCH 30.7  26.0 - 34.0 pg   MCHC 36.0  30.0 - 36.0 g/dL   RDW 62.1  30.8 - 65.7 %   Platelets 360  150 - 400 K/uL  COMPREHENSIVE METABOLIC PANEL   Collection Time    08/22/13  5:54 PM      Result Value Range   Sodium 133 (*) 135 - 145 mEq/L   Potassium 5.9 (*) 3.5 - 5.1 mEq/L   Chloride 88 (*) 96 - 112 mEq/L   CO2 8 (*) 19 - 32 mEq/L   Glucose, Bld 711 (*) 70 - 99 mg/dL   BUN 25 (*) 6 - 23 mg/dL   Creatinine, Ser 8.46 (*) 0.50 - 1.35 mg/dL   Calcium 9.6  8.4 - 96.2 mg/dL   Total Protein 8.4 (*) 6.0 - 8.3 g/dL   Albumin 5.1  3.5 - 5.2 g/dL   AST 27  0 - 37 U/L   ALT 35  0 - 53 U/L   Alkaline Phosphatase 82  39 - 117 U/L   Total Bilirubin 0.7  0.3 - 1.2 mg/dL   GFR calc non Af Amer 71 (*) >90 mL/min   GFR calc Af Amer 83 (*) >90 mL/min  LIPASE, BLOOD   Collection Time    08/22/13  5:54 PM      Result Value Range   Lipase 10 (*) 11 - 59 U/L  LACTIC ACID, PLASMA   Collection Time    08/22/13  7:27 PM      Result Value Range   Lactic Acid, Venous 3.2 (*) 0.5 - 2.2 mmol/L  GLUCOSE, CAPILLARY   Collection Time    08/22/13  7:31 PM      Result Value Range   Glucose-Capillary >600 (*) 70 - 99 mg/dL  CULTURE, BLOOD (ROUTINE X 2)   Collection Time    08/22/13  8:05 PM      Result Value Range   Specimen Description BLOOD LEFT HAND     Special Requests BOTTLES DRAWN AEROBIC ONLY 6CC     Culture  Setup  Time       Value: 08/23/2013 18:10     Performed at [facility]   Culture       Value:        BLOOD CULTURE RECEIVED NO GROWTH TO DATE CULTURE WILL BE HELD FOR 5 DAYS BEFORE ISSUING A FINAL  NEGATIVE REPORT     Performed at [facility]   Report Status PENDING    BASIC METABOLIC PANEL   Collection Time    08/22/13  8:05 PM      Result Value Range   Sodium 138  135 - 145 mEq/L   Potassium 5.5 (*) 3.5 - 5.1 mEq/L   Chloride 99  96 - 112 mEq/L   CO2 9 (*) 19 - 32 mEq/L   Glucose, Bld 584 (*) 70 - 99 mg/dL   BUN 24 (*) 6 - 23 mg/dL   Creatinine, Ser 1.61  0.50 - 1.35 mg/dL   Calcium 8.4  8.4 - 09.6 mg/dL   GFR calc non Af Amer >90  >90 mL/min   GFR calc Af Amer >90  >90 mL/min  CULTURE, BLOOD (ROUTINE X 2)   Collection Time    08/22/13  8:13 PM      Result Value Range   Specimen Description BLOOD RIGHT HAND     Special Requests BOTTLES DRAWN AEROBIC ONLY 4CC     Culture  Setup Time       Value: 08/23/2013 18:10     Performed at [facility]   Culture       Value:        BLOOD CULTURE RECEIVED NO GROWTH TO DATE CULTURE WILL BE HELD FOR 5 DAYS BEFORE ISSUING A FINAL NEGATIVE REPORT     Performed at [facility]   Report Status PENDING    CG4 I-STAT (LACTIC ACID)   Collection Time    08/22/13  8:30 PM      Result Value Range   Lactic Acid, Venous 3.18 (*) 0.5 - 2.2 mmol/L  GLUCOSE, CAPILLARY   Collection Time    08/22/13  8:43 PM      Result Value Range   Glucose-Capillary 460 (*) 70 - 99 mg/dL  GLUCOSE, CAPILLARY   Collection Time    08/22/13  9:47 PM      Result Value Range   Glucose-Capillary 400 (*) 70 - 99 mg/dL  GLUCOSE, CAPILLARY   Collection Time    08/22/13 10:47 PM      Result Value Range   Glucose-Capillary 354 (*) 70 - 99 mg/dL  BASIC METABOLIC PANEL   Collection Time    08/22/13 10:48 PM      Result Value Range   Sodium 140  135 - 145 mEq/L   Potassium 4.3  3.5 - 5.1 mEq/L   Chloride 105  96 - 112 mEq/L   CO2 10 (*) 19  - 32 mEq/L   Glucose, Bld 396 (*) 70 - 99 mg/dL   BUN 22  6 - 23 mg/dL   Creatinine, Ser 0.45  0.50 - 1.35 mg/dL   Calcium 8.1 (*) 8.4 - 10.5 mg/dL   GFR calc non Af Amer >90  >90 mL/min   GFR calc Af Amer >90  >90 mL/min  GLUCOSE, CAPILLARY   Collection Time    08/22/13 11:51 PM      Result Value Range   Glucose-Capillary 311 (*) 70 - 99 mg/dL  MRSA PCR SCREENING   Collection Time    08/23/13 12:37 AM      Result Value Range   MRSA by PCR NEGATIVE  NEGATIVE  BASIC METABOLIC PANEL   Collection Time    08/23/13 12:49 AM      Result Value Range   Sodium 133 (*) 135 - 145 mEq/L   Potassium 3.9  3.5 -  5.1 mEq/L   Chloride 102  96 - 112 mEq/L   CO2 10 (*) 19 - 32 mEq/L   Glucose, Bld 266 (*) 70 - 99 mg/dL   BUN 19  6 - 23 mg/dL   Creatinine, Ser 0.10  0.50 - 1.35 mg/dL   Calcium 8.4  8.4 - 27.2 mg/dL   GFR calc non Af Amer >90  >90 mL/min   GFR calc Af Amer >90  >90 mL/min  GLUCOSE, CAPILLARY   Collection Time    08/23/13 12:55 AM      Result Value Range   Glucose-Capillary 256 (*) 70 - 99 mg/dL  GLUCOSE, CAPILLARY   Collection Time    08/23/13  1:56 AM      Result Value Range   Glucose-Capillary 189 (*) 70 - 99 mg/dL  BASIC METABOLIC PANEL   Collection Time    08/23/13  2:40 AM      Result Value Range   Sodium 134 (*) 135 - 145 mEq/L   Potassium 3.7  3.5 - 5.1 mEq/L   Chloride 104  96 - 112 mEq/L   CO2 12 (*) 19 - 32 mEq/L   Glucose, Bld 193 (*) 70 - 99 mg/dL   BUN 18  6 - 23 mg/dL   Creatinine, Ser 5.36  0.50 - 1.35 mg/dL   Calcium 8.4  8.4 - 64.4 mg/dL   GFR calc non Af Amer >90  >90 mL/min   GFR calc Af Amer >90  >90 mL/min  CBC   Collection Time    08/23/13  2:40 AM      Result Value Range   WBC 21.1 (*) 4.0 - 10.5 K/uL   RBC 4.60  4.22 - 5.81 MIL/uL   Hemoglobin 14.2  13.0 - 17.0 g/dL   HCT 03.4 (*) 74.2 - 59.5 %   MCV 83.5  78.0 - 100.0 fL   MCH 30.9  26.0 - 34.0 pg   MCHC 37.0 (*) 30.0 - 36.0 g/dL   RDW 63.8  75.6 - 43.3 %   Platelets 269  150 -  400 K/uL  TSH   Collection Time    08/23/13  2:40 AM      Result Value Range   TSH 1.139  0.350 - 4.500 uIU/mL  GLUCOSE, CAPILLARY   Collection Time    08/23/13  3:04 AM      Result Value Range   Glucose-Capillary 175 (*) 70 - 99 mg/dL  GLUCOSE, CAPILLARY   Collection Time    08/23/13  4:10 AM      Result Value Range   Glucose-Capillary 165 (*) 70 - 99 mg/dL  GLUCOSE, CAPILLARY   Collection Time    08/23/13  5:16 AM      Result Value Range   Glucose-Capillary 152 (*) 70 - 99 mg/dL  BASIC METABOLIC PANEL   Collection Time    08/23/13  5:30 AM      Result Value Range   Sodium 132 (*) 135 - 145 mEq/L   Potassium 3.7  3.5 - 5.1 mEq/L   Chloride 106  96 - 112 mEq/L   CO2 14 (*) 19 - 32 mEq/L   Glucose, Bld 142 (*) 70 - 99 mg/dL   BUN 15  6 - 23 mg/dL   Creatinine, Ser 2.95  0.50 - 1.35 mg/dL   Calcium 7.9 (*) 8.4 - 10.5 mg/dL   GFR calc non Af Amer >90  >90 mL/min   GFR calc Af Amer >90  >  90 mL/min  GLUCOSE, CAPILLARY   Collection Time    08/23/13  6:19 AM      Result Value Range   Glucose-Capillary 151 (*) 70 - 99 mg/dL  GLUCOSE, CAPILLARY   Collection Time    08/23/13  7:25 AM      Result Value Range   Glucose-Capillary 132 (*) 70 - 99 mg/dL  GLUCOSE, CAPILLARY   Collection Time    08/23/13  8:32 AM      Result Value Range   Glucose-Capillary 100 (*) 70 - 99 mg/dL   Comment 1 Documented in Chart     Comment 2 Notify RN    BASIC METABOLIC PANEL   Collection Time    08/23/13  8:35 AM      Result Value Range   Sodium 133 (*) 135 - 145 mEq/L   Potassium 3.9  3.5 - 5.1 mEq/L   Chloride 104  96 - 112 mEq/L   CO2 17 (*) 19 - 32 mEq/L   Glucose, Bld 97  70 - 99 mg/dL   BUN 13  6 - 23 mg/dL   Creatinine, Ser 5.62  0.50 - 1.35 mg/dL   Calcium 8.9  8.4 - 13.0 mg/dL   GFR calc non Af Amer >90  >90 mL/min   GFR calc Af Amer >90  >90 mL/min  GLUCOSE, CAPILLARY   Collection Time    08/23/13  9:39 AM      Result Value Range   Glucose-Capillary 84  70 - 99 mg/dL   GLUCOSE, CAPILLARY   Collection Time    08/23/13 10:56 AM      Result Value Range   Glucose-Capillary 118 (*) 70 - 99 mg/dL   Comment 1 Documented in Chart     Comment 2 Notify RN    BASIC METABOLIC PANEL   Collection Time    08/23/13 11:19 AM      Result Value Range   Sodium 133 (*) 135 - 145 mEq/L   Potassium 4.1  3.5 - 5.1 mEq/L   Chloride 103  96 - 112 mEq/L   CO2 19  19 - 32 mEq/L   Glucose, Bld 139 (*) 70 - 99 mg/dL   BUN 12  6 - 23 mg/dL   Creatinine, Ser 8.65  0.50 - 1.35 mg/dL   Calcium 8.2 (*) 8.4 - 10.5 mg/dL   GFR calc non Af Amer >90  >90 mL/min   GFR calc Af Amer >90  >90 mL/min  BASIC METABOLIC PANEL   Collection Time    08/23/13 12:01 PM      Result Value Range   Sodium 134 (*) 135 - 145 mEq/L   Potassium 3.7  3.5 - 5.1 mEq/L   Chloride 104  96 - 112 mEq/L   CO2 18 (*) 19 - 32 mEq/L   Glucose, Bld 182 (*) 70 - 99 mg/dL   BUN 10  6 - 23 mg/dL   Creatinine, Ser 7.84  0.50 - 1.35 mg/dL   Calcium 8.2 (*) 8.4 - 10.5 mg/dL   GFR calc non Af Amer >90  >90 mL/min   GFR calc Af Amer >90  >90 mL/min  GLUCOSE, CAPILLARY   Collection Time    08/23/13 12:03 PM      Result Value Range   Glucose-Capillary 175 (*) 70 - 99 mg/dL   Comment 1 Documented in Chart     Comment 2 Notify RN    GLUCOSE, CAPILLARY   Collection Time  08/23/13  1:08 PM      Result Value Range   Glucose-Capillary 168 (*) 70 - 99 mg/dL   Comment 1 Documented in Chart     Comment 2 Notify RN    GLUCOSE, CAPILLARY   Collection Time    08/23/13  2:12 PM      Result Value Range   Glucose-Capillary 178 (*) 70 - 99 mg/dL   Comment 1 Documented in Chart     Comment 2 Notify RN    GLUCOSE, CAPILLARY   Collection Time    08/23/13  3:29 PM      Result Value Range   Glucose-Capillary 172 (*) 70 - 99 mg/dL  GLUCOSE, CAPILLARY   Collection Time    08/23/13  5:03 PM      Result Value Range   Glucose-Capillary 164 (*) 70 - 99 mg/dL  GLUCOSE, CAPILLARY   Collection Time    08/23/13  6:10 PM       Result Value Range   Glucose-Capillary 158 (*) 70 - 99 mg/dL   Comment 1 Documented in Chart     Comment 2 Notify RN    BASIC METABOLIC PANEL   Collection Time    08/23/13  8:50 PM      Result Value Range   Sodium 136  135 - 145 mEq/L   Potassium 3.4 (*) 3.5 - 5.1 mEq/L   Chloride 105  96 - 112 mEq/L   CO2 19  19 - 32 mEq/L   Glucose, Bld 94  70 - 99 mg/dL   BUN 7  6 - 23 mg/dL   Creatinine, Ser 1.47  0.50 - 1.35 mg/dL   Calcium 8.5  8.4 - 82.9 mg/dL   GFR calc non Af Amer >90  >90 mL/min   GFR calc Af Amer >90  >90 mL/min  GLUCOSE, CAPILLARY   Collection Time    08/23/13 10:12 PM      Result Value Range   Glucose-Capillary 78  70 - 99 mg/dL   Comment 1 Notify RN    BASIC METABOLIC PANEL   Collection Time    08/24/13  4:13 AM      Result Value Range   Sodium 136  135 - 145 mEq/L   Potassium 3.6  3.5 - 5.1 mEq/L   Chloride 103  96 - 112 mEq/L   CO2 18 (*) 19 - 32 mEq/L   Glucose, Bld 135 (*) 70 - 99 mg/dL   BUN 5 (*) 6 - 23 mg/dL   Creatinine, Ser 5.62  0.50 - 1.35 mg/dL   Calcium 8.5  8.4 - 13.0 mg/dL   GFR calc non Af Amer >90  >90 mL/min   GFR calc Af Amer >90  >90 mL/min  GLUCOSE, CAPILLARY   Collection Time    08/24/13  6:15 AM      Result Value Range   Glucose-Capillary 170 (*) 70 - 99 mg/dL   Comment 1 Notify RN    GLUCOSE, CAPILLARY   Collection Time    08/24/13  7:53 AM      Result Value Range   Glucose-Capillary 184 (*) 70 - 99 mg/dL   Comment 1 Documented in Chart     Comment 2 Notify RN    GLUCOSE, CAPILLARY   Collection Time    08/24/13 11:11 AM      Result Value Range   Glucose-Capillary 121 (*) 70 - 99 mg/dL   Comment 1 Documented in Chart     Comment  2 Notify RN    GLUCOSE, CAPILLARY   Collection Time    08/24/13 12:25 PM      Result Value Range   Glucose-Capillary 133 (*) 70 - 99 mg/dL  GLUCOSE, CAPILLARY   Collection Time    08/24/13  1:29 PM      Result Value Range   Glucose-Capillary 140 (*) 70 - 99 mg/dL  GLUCOSE, CAPILLARY    Collection Time    08/24/13  2:35 PM      Result Value Range   Glucose-Capillary 114 (*) 70 - 99 mg/dL   Comment 1 Documented in Chart     Comment 2 Notify RN    GLUCOSE, CAPILLARY   Collection Time    08/24/13  3:41 PM      Result Value Range   Glucose-Capillary 77  70 - 99 mg/dL   Comment 1 Documented in Chart     Comment 2 Notify RN    GLUCOSE, CAPILLARY   Collection Time    08/24/13  4:50 PM      Result Value Range   Glucose-Capillary 19 (*) 70 - 99 mg/dL   Comment 1 Notify RN    GLUCOSE, CAPILLARY   Collection Time    08/24/13  4:52 PM      Result Value Range   Glucose-Capillary 64 (*) 70 - 99 mg/dL  GLUCOSE, CAPILLARY   Collection Time    08/24/13  5:19 PM      Result Value Range   Glucose-Capillary 74  70 - 99 mg/dL  BASIC METABOLIC PANEL   Collection Time    08/24/13  5:28 PM      Result Value Range   Sodium 138  135 - 145 mEq/L   Potassium 3.1 (*) 3.5 - 5.1 mEq/L   Chloride 103  96 - 112 mEq/L   CO2 23  19 - 32 mEq/L   Glucose, Bld 67 (*) 70 - 99 mg/dL   BUN 4 (*) 6 - 23 mg/dL   Creatinine, Ser 1.61  0.50 - 1.35 mg/dL   Calcium 8.8  8.4 - 09.6 mg/dL   GFR calc non Af Amer >90  >90 mL/min   GFR calc Af Amer >90  >90 mL/min  GLUCOSE, CAPILLARY   Collection Time    08/24/13  6:23 PM      Result Value Range   Glucose-Capillary 142 (*) 70 - 99 mg/dL  GLUCOSE, CAPILLARY   Collection Time    08/24/13  7:29 PM      Result Value Range   Glucose-Capillary 160 (*) 70 - 99 mg/dL  GLUCOSE, CAPILLARY   Collection Time    08/24/13  8:39 PM      Result Value Range   Glucose-Capillary 165 (*) 70 - 99 mg/dL  BASIC METABOLIC PANEL   Collection Time    08/24/13  9:03 PM      Result Value Range   Sodium 138  135 - 145 mEq/L   Potassium 2.9 (*) 3.5 - 5.1 mEq/L   Chloride 101  96 - 112 mEq/L   CO2 24  19 - 32 mEq/L   Glucose, Bld 174 (*) 70 - 99 mg/dL   BUN 4 (*) 6 - 23 mg/dL   Creatinine, Ser 0.45  0.50 - 1.35 mg/dL   Calcium 8.5  8.4 - 40.9 mg/dL   GFR  calc non Af Amer >90  >90 mL/min   GFR calc Af Amer >90  >90 mL/min  GLUCOSE, CAPILLARY   Collection  Time    08/24/13  9:43 PM      Result Value Range   Glucose-Capillary 175 (*) 70 - 99 mg/dL  GLUCOSE, CAPILLARY   Collection Time    08/24/13 10:40 PM      Result Value Range   Glucose-Capillary 173 (*) 70 - 99 mg/dL  GLUCOSE, CAPILLARY   Collection Time    08/24/13 11:38 PM      Result Value Range   Glucose-Capillary 187 (*) 70 - 99 mg/dL  BASIC METABOLIC PANEL   Collection Time    08/25/13 12:43 AM      Result Value Range   Sodium 141  135 - 145 mEq/L   Potassium 3.0 (*) 3.5 - 5.1 mEq/L   Chloride 104  96 - 112 mEq/L   CO2 26  19 - 32 mEq/L   Glucose, Bld 163 (*) 70 - 99 mg/dL   BUN 3 (*) 6 - 23 mg/dL   Creatinine, Ser 1.61  0.50 - 1.35 mg/dL   Calcium 8.5  8.4 - 09.6 mg/dL   GFR calc non Af Amer >90  >90 mL/min   GFR calc Af Amer >90  >90 mL/min  GLUCOSE, CAPILLARY   Collection Time    08/25/13 12:44 AM      Result Value Range   Glucose-Capillary 151 (*) 70 - 99 mg/dL  BASIC METABOLIC PANEL   Collection Time    08/25/13  5:05 AM      Result Value Range   Sodium 144  135 - 145 mEq/L   Potassium 3.5  3.5 - 5.1 mEq/L   Chloride 107  96 - 112 mEq/L   CO2 26  19 - 32 mEq/L   Glucose, Bld 145 (*) 70 - 99 mg/dL   BUN 4 (*) 6 - 23 mg/dL   Creatinine, Ser 0.45  0.50 - 1.35 mg/dL   Calcium 8.4  8.4 - 40.9 mg/dL   GFR calc non Af Amer >90  >90 mL/min   GFR calc Af Amer >90  >90 mL/min  CBC   Collection Time    08/25/13  5:05 AM      Result Value Range   WBC 5.4  4.0 - 10.5 K/uL   RBC 4.18 (*) 4.22 - 5.81 MIL/uL   Hemoglobin 12.7 (*) 13.0 - 17.0 g/dL   HCT 81.1 (*) 91.4 - 78.2 %   MCV 83.0  78.0 - 100.0 fL   MCH 30.4  26.0 - 34.0 pg   MCHC 36.6 (*) 30.0 - 36.0 g/dL   RDW 95.6  21.3 - 08.6 %   Platelets 169  150 - 400 K/uL  GLUCOSE, CAPILLARY   Collection Time    08/25/13  7:50 AM      Result Value Range   Glucose-Capillary 151 (*) 70 - 99 mg/dL  GLUCOSE,  CAPILLARY   Collection Time    08/25/13 11:28 AM      Result Value Range   Glucose-Capillary 143 (*) 70 - 99 mg/dL   Comment 1 Notify RN     Comment 2 Documented in Chart    GLUCOSE, CAPILLARY   Collection Time    08/25/13 12:34 PM      Result Value Range   Glucose-Capillary 151 (*) 70 - 99 mg/dL   Comment 1 Documented in Chart     Comment 2 Notify RN     Labs 08/23/13: TSH 1.139   Assessment and Plan:   ASSESSMENT:  1. Type 1 diabetes mellitus:  A.  Prior to his recent admission for DKA, his BGs were high and he was often not doing correction boluses. It's unclear whether he had food poisoning which tipped him over into DKA or whether his BG control was just so poor that DKA developed on its own.   B. The patient's BG control has improved since discharge. When he checked his BGs and gives himself insulin appropriately, his BGs are better. 2. Hypoglycemia: He has had few BGs < 80, none severe.  3. Autonomic neuropathy and inappropriate sinus tachycardia: These problems are worse today due to high BGs. These problems are reversible if he gets his BGs in better control.  4. Peripheral neuropathy: This problem is somewhat worse today, again due to high BGs. This problems is also equally reversible with improved BG control .   5. Hypothyroid: He was euthyroid 4  months ago and again last week. He has been taking his Synthroid regularly.  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   7. Hypertension: His BP is too high, He needs to take lisinopril reliably. 8. Hashimoto's disease: Thyroiditis is clinically quiescent. 9. Depression: This is his major problem. He seemed almost manic today.  10. Noncompliance: I hope that he will do much better now.  11. Weight loss: His weight decreased 4 pounds since last visit, mostly due to under-insulinization.  PLAN:  1. Diagnostic: No labs today. Call next Wednesday and review BGs at that time. 2. Therapeutic: Continue  Synthroid and lisinopril at the current doses. Continue current pump settings  3. Patient education: We talked about his BG control and about how all of the long-term complications of DM can develop. He knows what he should do and knows how to do it. Now he needs to commit to action. 4. Follow-up: November 13th.  Level of Service: This visit lasted in excess of 75 minutes. More than 50% of the visit was devoted to counseling.  David Stall, MD 08/28/2013 11:18 AM

## 2013-08-29 LAB — CULTURE, BLOOD (ROUTINE X 2): Culture: NO GROWTH

## 2013-08-30 ENCOUNTER — Encounter: Payer: Self-pay | Admitting: "Endocrinology

## 2013-09-03 ENCOUNTER — Telehealth: Payer: Self-pay | Admitting: "Endocrinology

## 2013-09-03 ENCOUNTER — Telehealth: Payer: Self-pay | Admitting: *Deleted

## 2013-09-03 NOTE — Telephone Encounter (Signed)
Received call from Scott Moran, said that Dr. Fransico Fredie had instructed him to call every two weeks, advised to call this evening between 8-9:30 to give his BG's. Scott Moran

## 2013-09-03 NOTE — Telephone Encounter (Signed)
Received telephone call from Scott Moran Needle. 1. Overall status: Doing better 2. New problems: He's had a few BGs 3. Rapid-acting insulin: Novolog in pump 4. BG log: 2 AM, Breakfast, Lunch, Supper, Bedtime 08/31/13: xxx,xxx, 70, xxx, xxx 09/01/13: 178, xxx, xxx, 222, 305 09/02/13: xxx, xxx, 174, 186, site change 287 09/03/13: xxx, xxx, 54/137, xxx 5. Assessment: BGs are better. He checks so erratically that it is hard top know what to do about his pump settings. 6. Plan: Try not to go > 10 hours between BG checks. 7. FU call: Monday evening Scott Moran,Scott Moran

## 2013-09-07 ENCOUNTER — Telehealth: Payer: Self-pay | Admitting: "Endocrinology

## 2013-09-07 NOTE — Telephone Encounter (Signed)
Received telephone call from [name]. 1. Overall status: He is doing well He went to church and interacted with others yesterday. Last site change was 4 days ago. 2. New problems: None 3. Rapid-acting insulin: Novolog in pump 4. BG log: 2 AM, Breakfast, Lunch, Supper, Bedtime 09/05/13: 203 went to bed at 5:30 AM, xxx, 99, back to bed 51, party 234 09/06/13: xxx, 62, xxx, 91, xxx 09/07/13: xxx, xxx, 302, 210 5. Assessment: Basal rates are too high if the sites are working well. He also is not being careful enough with Bg checks. Needs to change site now. 6. Plan: New basal rates: MN: 0.85 -> 0.80 5 AM: 0.85 -> 0.80 9 AM: 0.90 -> 0.85 Noon: 1.05 -> 1.00 6 PM: 1.60 -> 1.55 9 PM: 1.30 -> 1.25 7. FU call: Sunday 09/27/13 David Stall

## 2013-09-30 NOTE — Discharge Summary (Signed)
Physician Discharge Summary  Scott Moran NFA:[id number] DOB: 12/14/1990 DOA: 08/22/2013  PCP: Nonnie Done., MD  Admit date: 08/22/2013 Discharge date: 08/25/13  Time spent: >35 minutes   Discharge Diagnoses:  Principal Problem:   DKA, type 1 Active Problems:   Thyroiditis, autoimmune   DM type 1 with diabetic peripheral neuropathy   SIRS (systemic inflammatory response syndrome)   AKI (acute kidney injury)   Discharge Condition: stabe  Diet recommendation: diabetic and heart healthy  Filed Weights   08/22/13 1923 08/22/13 1934 08/22/13 2054  Weight: 61.236 kg (135 lb) 61.236 kg (135 lb) 58.5 kg (128 lb 15.5 oz)    History of present illness:  22 y.o. male w/ h/o DM1 on insulin pump at home who presented to ED complaining of gradual onset worsening N/V with associated hyperglycemia. Patient ate at a fast food establishment, began to feel poorly at 5 pm and began vomiting at 3 AM. CBG was climbing throughout the day. He called his Endocrinologist who promptly told him to come to the ED. In the ED the patient was found to be in DKA.   Hospital Course:  DKA in DM type 1  Resolved- resume insulin pump upon d/c  Hypothyroid s/p Hashimoto's thyroiditis  Cont synthroid   Depression  Cont home medical regimen   Discharge Exam: Filed Vitals:   08/25/13 1200  BP: 126/82  Pulse:   Temp: 98.5 F (36.9 C)  Resp:     General: AAO x 3 Cardiovascular: RRR, no murmurs Respiratory: CTA b/l  Discharge Instructions  Discharge Orders   Future Appointments Provider Department Dept Phone   10/15/2013 3:30 PM David Stall, MD [facility] [phone]   Future Orders Complete By Expires   Diet - low sodium heart healthy  As directed    Comments:     Diabetic diet   Increase activity slowly  As directed        Medication List    STOP taking these medications       insulin glargine 100 UNIT/ML injection  Commonly known  as:  LANTUS     lisinopril 10 MG tablet  Commonly known as:  PRINIVIL,ZESTRIL      TAKE these medications       escitalopram 10 MG tablet  Commonly known as:  LEXAPRO  Take 10 mg by mouth daily.     glucagon 1 MG injection  Commonly known as:  GLUCAGON EMERGENCY  Inject 1 mg intramuscularly in thigh 1 time for severe hypoglycemia if unresponsive, unconscious, unable to swallow or has a seizure.     glucose blood test strip  Commonly known as:  BAYER CONTOUR NEXT TEST  Use as instructed to check your blood sugar up to 6x daily. Please provide #90 day supply.     insulin aspart 100 UNIT/ML injection  Commonly known as:  novoLOG  300 units in insulin pump every 48 to 72 hours and per Protocols for Hyperglycemia & DKA Outpatient Treatment.     Insulin Pen Needle 31G X 5 MM Misc  BD Pen Needles- brand specific Inject insulin via insulin pen 7 x daily     levothyroxine 25 MCG tablet  Commonly known as:  SYNTHROID, LEVOTHROID  Take 25 mcg by mouth daily before breakfast.       No Known Allergies    The results of significant diagnostics from this hospitalization (including imaging, microbiology, ancillary and laboratory) are listed below for reference.    Significant Diagnostic Studies:  No results found.  Microbiology: No results found for this or any previous visit (from the past 240 hour(s)).   Labs: Basic Metabolic Panel: No results found for this basename: NA, K, CL, CO2, GLUCOSE, BUN, CREATININE, CALCIUM, MG, PHOS,  in the last 168 hours Liver Function Tests: No results found for this basename: AST, ALT, ALKPHOS, BILITOT, PROT, ALBUMIN,  in the last 168 hours No results found for this basename: LIPASE, AMYLASE,  in the last 168 hours No results found for this basename: AMMONIA,  in the last 168 hours CBC: No results found for this basename: WBC, NEUTROABS, HGB, HCT, MCV, PLT,  in the last 168 hours Cardiac Enzymes: No results found for this basename: CKTOTAL,  CKMB, CKMBINDEX, TROPONINI,  in the last 168 hours BNP: BNP (last 3 results) No results found for this basename: PROBNP,  in the last 8760 hours CBG: No results found for this basename: GLUCAP,  in the last 168 hours     Signed:  Oluwateniola Leitch  [facility] 08/25/2013, 9:01 AM

## 2013-10-06 ENCOUNTER — Telehealth: Payer: Self-pay | Admitting: "Endocrinology

## 2013-10-06 NOTE — Telephone Encounter (Signed)
Received telephone call from mother and patient:  1. Overall status: He continues to be erratic about checking BGs, taking boluses, and changing sites. On Sunday evening when his BGs were > 400 for 6-8 hours he did not follow the hyperglycemia and DKA protocols. He missed his FU call to me on Sunday evening.  2. New problems: BGs have been higher for several days and on the prior weekend. He gave himself 5 units of Lantus yesterday and 4 units today. His current site was changed about 2 AM this morning. The pump gave him an alert that the insulin was not going through. His urine ketones are still at 80. He has been eating and drinking. He is alert and functional.   3. Rapid-acting insulin: Novolog 4. BG log: 2 AM, Breakfast, Lunch, Supper, Bedtime xxx, 494/444, 398/324, 307  5. Assessment: The old site went bad on Sunday evening, but he did not change it until 2 AM today. The new site is working. Unfortunately, he still has not been following the DKA protocol today. Scott Moran and his parents seem to have forgotten almost everything we've taught them. Scott Moran is lucid, however, and quite able to follow the DKA protocol.  6. Plan: I reviewed the DKA and hyperglycemia protocols with them. He is to drink liquids and give insulin boluses according to the protocol. If he does all of the above he will gradually but progressively clear his ketones. He should not need to go to the ED or to be admitted.  7. FU call: later this evening if needed.  David Stall

## 2013-10-06 NOTE — Telephone Encounter (Signed)
Pt and Mom called. Having problem downloading [employer] Pump to [facility]. Downloaded meter but can't get the pump to download.  He has been running some higher blood sugars. I told pt that I was with patients most of day, but would get back to him late afternoon to figure out the problem.  Rossi said that was fine.

## 2013-10-06 NOTE — Telephone Encounter (Signed)
I called pt back at 4:45 pm.  Spoke with Mother. Isabella's blood sugars running in the 400's. No urine ketones checked.   I instructed Mom to have Pate check his blood sugar and his urine ketones and then call me  Back. I will download and print out his meter blood sugars from [facility].  I received a call back at approximately 5:10 pm: 1. 5 pm BG = 307 mg/dl. 2. Urine ketones at the low end of Large (80). 3. He took a dose of Lantus at 1:30 pm 4. He took a Correction Bolus using his pump at 3:30 pm. 5. He just downloaded his pump to [facility].  I printed out his pump download and gave it to Dr. Fransico Daniell.  He will call [name].

## 2013-10-07 ENCOUNTER — Telehealth: Payer: Self-pay | Admitting: "Endocrinology

## 2013-10-07 NOTE — Telephone Encounter (Signed)
1. I called the family for an update on Scott Moran's condition. I talked with Scott Moran and Scott Moran.  2. Scott Moran vomited twice this morning. He is not sure if he can drink much. He is not taking in 8 oz every 30 minutes. BGs have been in the 300s. Ketones were large this AM. He is doing BG checks and correction boluses at least every 2 hours. 3. I reviewed the DKA protocol with Scott Moran and Scott Moran.  4. If Scott Moran can keep enough fluids down and can slowly reverse the ketones, he will be able to remain at home. If he can't keep fluids down, however, he will have to go to the [facility] ED for iv fluids. He may then need to be admitted if his DKA is severe enough.   5. The family will let me know if he needs to go to the ED.  David Stall

## 2013-10-15 ENCOUNTER — Encounter: Payer: Self-pay | Admitting: "Endocrinology

## 2013-10-15 ENCOUNTER — Ambulatory Visit (INDEPENDENT_AMBULATORY_CARE_PROVIDER_SITE_OTHER): Payer: BC Managed Care – PPO | Admitting: "Endocrinology

## 2013-10-15 VITALS — BP 117/75 | HR 110 | Wt 131.0 lb

## 2013-10-15 DIAGNOSIS — F329 Major depressive disorder, single episode, unspecified: Secondary | ICD-10-CM

## 2013-10-15 DIAGNOSIS — E1169 Type 2 diabetes mellitus with other specified complication: Secondary | ICD-10-CM

## 2013-10-15 DIAGNOSIS — IMO0002 Reserved for concepts with insufficient information to code with codable children: Secondary | ICD-10-CM

## 2013-10-15 DIAGNOSIS — Z9119 Patient's noncompliance with other medical treatment and regimen: Secondary | ICD-10-CM

## 2013-10-15 DIAGNOSIS — E1043 Type 1 diabetes mellitus with diabetic autonomic (poly)neuropathy: Secondary | ICD-10-CM

## 2013-10-15 DIAGNOSIS — E11649 Type 2 diabetes mellitus with hypoglycemia without coma: Secondary | ICD-10-CM

## 2013-10-15 DIAGNOSIS — E236 Other disorders of pituitary gland: Secondary | ICD-10-CM

## 2013-10-15 DIAGNOSIS — E049 Nontoxic goiter, unspecified: Secondary | ICD-10-CM

## 2013-10-15 DIAGNOSIS — Z91199 Patient's noncompliance with other medical treatment and regimen due to unspecified reason: Secondary | ICD-10-CM

## 2013-10-15 DIAGNOSIS — I498 Other specified cardiac arrhythmias: Secondary | ICD-10-CM

## 2013-10-15 DIAGNOSIS — E063 Autoimmune thyroiditis: Secondary | ICD-10-CM

## 2013-10-15 DIAGNOSIS — G909 Disorder of the autonomic nervous system, unspecified: Secondary | ICD-10-CM

## 2013-10-15 DIAGNOSIS — I10 Essential (primary) hypertension: Secondary | ICD-10-CM

## 2013-10-15 DIAGNOSIS — E1049 Type 1 diabetes mellitus with other diabetic neurological complication: Secondary | ICD-10-CM

## 2013-10-15 DIAGNOSIS — E230 Hypopituitarism: Secondary | ICD-10-CM

## 2013-10-15 DIAGNOSIS — E1042 Type 1 diabetes mellitus with diabetic polyneuropathy: Secondary | ICD-10-CM

## 2013-10-15 DIAGNOSIS — I4711 Inappropriate sinus tachycardia, so stated: Secondary | ICD-10-CM

## 2013-10-15 DIAGNOSIS — R000 Tachycardia, unspecified: Secondary | ICD-10-CM

## 2013-10-15 DIAGNOSIS — E038 Other specified hypothyroidism: Secondary | ICD-10-CM

## 2013-10-15 DIAGNOSIS — F606 Avoidant personality disorder: Secondary | ICD-10-CM

## 2013-10-15 DIAGNOSIS — F4010 Social phobia, unspecified: Secondary | ICD-10-CM

## 2013-10-15 DIAGNOSIS — F32A Depression, unspecified: Secondary | ICD-10-CM

## 2013-10-15 DIAGNOSIS — E1065 Type 1 diabetes mellitus with hyperglycemia: Secondary | ICD-10-CM

## 2013-10-15 LAB — POCT GLYCOSYLATED HEMOGLOBIN (HGB A1C): Hemoglobin A1C: 8.6

## 2013-10-15 NOTE — Progress Notes (Signed)
Subjective:  Patient Name: Scott Moran Date of Birth: 1991-04-29  MRN: [id number]  Scott Moran  presents to the office today for follow-up of his type 1 diabetes mellitus, goiter, hypoglycemia, depression, hypothyroidism, thyroiditis, diabetic autonomic neuropathy, inappropriate sinus tachycardia, non-proliferative diabetic retinopathy, diabetic peripheral neuropathy, fatigue, hypogonadotropic hypogonadism, and medical non-compliance.  HISTORY OF PRESENT ILLNESS:   Scott Moran is a 22 y.o. Caucasian young man. Scott Moran was unaccompanied.   1. I first saw the patient in consultation on 03/27/05 in the [facility]patient [facility] of [facility] in [place], [place]. He had been referred by his primary care provider, Dr. Egbert Garibaldi, in [place], [place], for evaluation and treatment of poorly controlled type 1 diabetes. The patient was 22 years old.  A. The patient had been diagnosed with type 1 diabetes in February of 1995 at age 73. He was on an insulin pump when I first met him. His hemoglobin A1c had increased from 7.1% in December of 2005 to 8.2% in April 2006. He was having frequent snacks that he was not taking boluses for. He was also frequently noncompliant with checking blood sugars and taking meal boluses. I adjusted his basal rates.  B. At his next [facility] visit on 06/19/05, he was still very noncompliant. Mental status was normal. He had 25 g goiter. His father was serving in [place] with the [employer]. There was a lot of stress at home. I again adjusted his basal rates.  C. At the end of July 2006 we closed our satellite office in [place]. The patient elected to have me follow him at our clinic here in [place]. I have followed him here ever since.  2. During the past eight years, the patient has had several clinical issues.   A. T1DM: The patient's blood glucose control has been fairly good for a teenager. His maximum hemoglobin A1c occurred on 04/16/06 when the  hemoglobin A1c was 12.1%. Since then the hemoglobin A1c values have varied between 6.9% and 8.4%. He is still fairly noncompliant with checking blood sugars, bolusing, and changing his insulin pump sites on time, but he is more compliant and more careful than he used to be.   B. Goiter, Hashimoto's thyroiditis, and hypothyroidism: In late 2008 and early 2009 the patient developed hypothyroidism secondary to Hashimoto's disease. I started him on Synthroid, 25 mcg per day. When he takes his medication, he is euthyroid.  C. Autonomic neuropathy and inappropriate sinus tachycardia: In late 2008 he developed autonomic neuropathy which manifested as inappropriate sinus tachycardia.These problems vary in parallel with his BGs and HbA1c values.  D. Diabetic peripheral neuropathy: The patient has had symptoms and signs of mild diabetic peripheral neuropathy at several times in the past. His neuropathic symptoms similarly vary in parallel with his BGs and HbA1c values.   E. Non-proliferative diabetic retinopathy: This problem was identified during a diabetic eye exam in June 2010.  F. Hypogonadotropic hypogonadism: The patient has always been somewhat lackadaisical and relatively passive. On 06/29/10 his FSH was 4.9, LH was 2.4, testosterone was 399.66, and free testosterone was 79.8. The testosterone and free testosterone were relatively low for an 22 year old young man. At the same time the [facility] and LH while "normal", appeared to be somewhat low for the level of testosterone he had. We discussed the options for treatment including exercise and medication, to include AndroGel. The patient chose the exercise option. By 10/19/10 the [facility] was 6.8, the LH was 3.2, total testosterone was 615.88 and the free testosterone was 131.6.  It may be that when he was more depressed he had less gonadotropic function. We will continue to follow up on this issue over time.  G. Depression:    1). The patient has been diagnosed with  depression at different times in the past. For some time he took Remeron (mirtazapine), but discontinued this in early 2012. I have been trying to convince him for some time that he is depressed and that he should seek out help. He eventually did decide to seek psychiatric help.    2). On 06/08/13 Scott Moran was evaluated by Dr. Piedad Climes, a noted psychiatrist in [place].  Dr. Shane Crutch and his staff performed neuropsychiatric testing. On the COYT GOVONI had problems with non-verbal reasoning and emotional intelligence. Dr. 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 Dr. Shane Crutch prescribed escitalopram, 10 mg/day. Scott Moran will continue to see Dr. Shane Crutch in follow up;.   3). Scott Moran was then referred to a therapist, Dr. Jerlyn Ly. Scott Moran enjoys working with Dr. Loreta Ave and feels that he is making progress.    H. 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   3. The patient's last [facility] visit was on 08/28/13. In the interim, he has been healthy.   A. When he came into the clinic room today he started by thanking me for not giving up on him.    B. Last week he had several days of nausea, vomiting, and ketonuria. His BGs were elevated. With the help of his parents, Scott Moran used our Sick Day and DKA protocols and was able to be treated successfully at home.   Scott Moran continues to see Dr. Jerlyn Ly, his therapist. Scott Moran likes Dr. Loreta Ave and is benefiting from Dr. Kenna Gilbert insights and advice. Since starting escitalopram, Scott Moran has felt better and has been more physically active.   D. He still has had a varied sleep pattern.   Scott Moran takes his Synthroid dose of 25 mcg/day and lisinopril 10 mg/day.   4. Pertinent Review of Systems:  Constitutional: The patient feels "pretty OK, great actually". His mood is  better.  Eyes: Vision is good with glasses. There are no significant eye complaints. Last eye exam was about 8 months ago. There were no signs of diabetic retinopathy. Neck: The patient has no complaints of anterior neck swelling, soreness, tenderness,  pressure, discomfort, or difficulty swallowing.  Heart: Heart rate sometimes feels fast. Heart rate increases with exercise or other physical activity. The patient has no complaints of palpitations, irregular heat beats, chest pain, or chest pressure. Gastrointestinal: Bowel movents seem normal. The patient has no complaints of excessive hunger, acid reflux, upset stomach, stomach aches or pains, diarrhea, or constipation. Legs: Muscle mass and strength seem normal. There are no complaints of numbness, tingling, burning, or pain. No edema is noted. Feet: There are no obvious foot problems. There are no complaints of numbness, tingling, burning, or pain. No edema is noted. Hypoglycemia: He has had a few low BGs when he wakes up, some down to the 50s. Emotional/psychological: The patient is doing better with medication and therapy.   6. BG printout: In the past month his average BG was 268, compared with 297 at last visit and with 231 at the visit prior. He has had many BGs < 70 through out the 24-hour period. The patient changed his insertion sites every 3-9 days. He  has been checking BGs 0-14 times daily. He sometimes goes up to 30 hours without checking BGs.  He has been bolusing 0-8 times daily. He is much less compliant with his DM care plan than he was at last visit. Not surprisingly, his BGs are more variable and more erratic. Most of his low BGs occur when he does not check his BGs and does not eat for along period of time.  PAST MEDICAL, FAMILY, AND SOCIAL HISTORY:  Past Medical History  Diagnosis Date  . Type 1 diabetes mellitus not at goal   . Hypoglycemia associated with diabetes   . Goiter   . Thyroiditis, autoimmune   . Hypothyroidism,  acquired, autoimmune   . Tachycardia   . Hypertension   . Hypogonadotropic hypogonadism   . Type 1 diabetes mellitus with diabetic autonomic neuropathy   . DM type 1 with diabetic peripheral neuropathy     Family History  Problem Relation Age of Onset  . Diabetes Maternal Uncle   . Hypertension Maternal Grandmother   . Thyroid disease Neg Hx   . Obesity Neg Hx   . Kidney disease Neg Hx   . Cancer Neg Hx   . Anemia Neg Hx     Current outpatient prescriptions:escitalopram (LEXAPRO) 10 MG tablet, Take 10 mg by mouth daily., Disp: , Rfl: ;  glucagon (GLUCAGON EMERGENCY) 1 MG injection, Inject 1 mg intramuscularly in thigh 1 time for severe hypoglycemia if unresponsive, unconscious, unable to swallow or has a seizure., Disp: 2 kit, Rfl: 3 glucose blood (BAYER CONTOUR NEXT TEST) test strip, Use as instructed to check your blood sugar up to 6x daily. Please provide #90 day supply., Disp: 600 each, Rfl: 1;  insulin aspart (NOVOLOG) 100 UNIT/ML injection, 300 units in insulin pump every 48 to 72 hours and per Protocols for Hyperglycemia & DKA Outpatient Treatment., Disp: 12 vial, Rfl: 1 Insulin Pen Moran 31G X 5 MM MISC, BD Pen Needles- brand specific Inject insulin via insulin pen 7 x daily, Disp: 250 each, Rfl: 3;  levothyroxine (SYNTHROID, LEVOTHROID) 25 MCG tablet, Take 25 mcg by mouth daily before breakfast., Disp: , Rfl: ;  lisinopril (PRINIVIL,ZESTRIL) 10 MG tablet, Take 10 mg by mouth daily., Disp: , Rfl:   Allergies as of 10/15/2013  . (No Known Allergies)    1. Work and Family: Patient is no longer attending junior college, in part due to family financial problems. He is still not working and is not interested in looking for work. 2. Activities: Few physical activities. He is a history buff and frequently participates in online chat room discussions.. 3. Smoking, alcohol, or drugs: None 4. Primary Care Provider: He no longer has a primary care provider.  REVIEW OF SYSTEMS: There are  no other significant problems involving Scott Moran's other body systems.   Objective:  Vital Signs:  BP 117/75  Pulse 110  Wt 131 lb (59.421 kg) His weight has increased 4 pounds since last visit.     Ht Readings from Last 3 Encounters:  08/22/13 5\' 7"  (1.702 m)   Wt Readings from Last 3 Encounters:  10/15/13 131 lb (59.421 kg)  08/28/13 127 lb 3.2 oz (57.698 kg)  08/22/13 128 lb 15.5 oz (58.5 kg)   PHYSICAL EXAM:  Constitutional: Scott Moran was very bright and alert today.He talked almost non-stop. He appears healthy, but slim.  Face: The face appears normal.  Eyes: There is no obvious arcus or proptosis. Moisture appears normal. Mouth: The oropharynx and tongue appear normal. Oral  moisture is normal. Neck: The neck looks normal. No carotid bruits are noted. The thyroid gland is again only minimally enlarged at 20+ grams in size. Both lobes are only minimally enlarged. The consistency of the thyroid gland is normal today.The thyroid gland is not tender to palpation. Lungs: The lungs are clear to auscultation. Air movement is good. Heart: Heart rate and rhythm are regular. Heart sounds S1 and S2 are normal. I did not appreciate any pathologic cardiac murmurs. Abdomen: The abdomen is normal in size. Bowel sounds are normal. There is no obvious hepatomegaly, splenomegaly, or other mass effect.  Arms: Muscle size and bulk are normal for age. Hands: There is no obvious tremor. Phalangeal and metacarpophalangeal joints are normal. Palmar muscles are normal. Palmar skin is normal. Palmar moisture is also normal. Legs: Muscles appear normal for age. No edema is present. Feet: Feet are normally formed. Dorsalis pedal pulses are 1+bilaterally.  Neurologic: Strength is normal for age in both the upper and lower extremities. Muscle tone is normal. Sensation to touch is normal in both legs, but decreased in the left heel.     LAB DATA: Hemoglobin A1c was 8.6% today, compared with 8.2% at last visit  and with 7.7% at the prior visit.   Results for orders placed in visit on 10/15/13 (from the past 504 hour(s))  GLUCOSE, POCT (MANUAL RESULT ENTRY)   Collection Time    10/15/13  3:14 PM      Result Value Range   POC Glucose 204 (*) 70 - 99 mg/dl  POCT GLYCOSYLATED HEMOGLOBIN (HGB A1C)   Collection Time    10/15/13  3:15 PM      Result Value Range   Hemoglobin A1C 8.6     Labs 08/23/13: TSH 1.139   Assessment and Plan:   ASSESSMENT:  1. Type 1 diabetes mellitus:  A. Prior to his recent admission for DKA, his BGs were high and he was often not doing correction boluses. In the first month after discharge he was much more compliant with BG checks and insulin boluses. In the last month, however, he has regressed.   B. The variability in BG levels is severe.  2. Hypoglycemia: He has had more low BGs recently. On some occasions it is clear that low BGs occur when he goes for many hours without checking BGs and without eating. At such times his basal rates reduce his BGs progressively, resulting in low BGs.  At present it is safer to reduce his basal rates across the 24-hour timeframe.  3. Autonomic neuropathy and inappropriate sinus tachycardia: These problems are somewhat worse today due to him having many more higher BGs recently. Fortunately, these problems will reverse with better BG control.  4. Peripheral neuropathy: This problem is about the same today. This problems is also equally reversible with improved BG control .   5. Hypothyroid: He was euthyroid 2  months ago. He say that he has been taking his Synthroid regularly.  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 We'll see how he dose after another three months on escitalopram and therapy.  7. Hypertension: His BP is better today, He needs to take lisinopril reliably. 8. Hashimoto's disease: Thyroiditis is clinically quiescent. 9. Depression: This problem is better.  10. Noncompliance: I hope that he  will do much better now.  11. Weight loss: His weight increased 4 pounds since last visit due to taking more insulin than 2 months ago.   PLAN:  1. Diagnostic: Call in 4 weeks  to discuss BGs.  2. Therapeutic: Continue Synthroid and lisinopril at the current doses. Reduce basal rates:  MN: 0.800 -> 0.750 5 AM: 0.800 -> 0.750 8 AM: 0.850 -> 0.800 Noon: 1.00 -> 0.950 6 PM: 1.550 -> 1.500 9 PM: 1.250 -> 1.200 3. Patient education: We talked about his BG control and about how all of the long-term complications of DM can develop. He knows what he should do and knows how to do it. Now he needs to commit to action. 4. Follow-up: 8 weeks  Level of Service: This visit lasted in excess of 75 minutes. More than 50% of the visit was devoted to counseling.  David Stall, MD 10/15/2013 3:29 PM

## 2013-10-15 NOTE — Patient Instructions (Signed)
Follow up visit in 2 months. Call Dr. Fransico Elmond in 4 weeks on either a Wednesday or Sunday evening to discuss BGs.

## 2013-10-16 DIAGNOSIS — F606 Avoidant personality disorder: Secondary | ICD-10-CM | POA: Insufficient documentation

## 2013-10-16 DIAGNOSIS — F329 Major depressive disorder, single episode, unspecified: Secondary | ICD-10-CM | POA: Insufficient documentation

## 2013-10-16 DIAGNOSIS — F32A Depression, unspecified: Secondary | ICD-10-CM | POA: Insufficient documentation

## 2013-10-16 DIAGNOSIS — F4010 Social phobia, unspecified: Secondary | ICD-10-CM | POA: Insufficient documentation

## 2013-12-15 ENCOUNTER — Ambulatory Visit (INDEPENDENT_AMBULATORY_CARE_PROVIDER_SITE_OTHER): Payer: BC Managed Care – PPO | Admitting: "Endocrinology

## 2013-12-15 VITALS — BP 130/84 | HR 112 | Wt 127.8 lb

## 2013-12-15 DIAGNOSIS — E038 Other specified hypothyroidism: Secondary | ICD-10-CM

## 2013-12-15 DIAGNOSIS — F32A Depression, unspecified: Secondary | ICD-10-CM

## 2013-12-15 DIAGNOSIS — E162 Hypoglycemia, unspecified: Secondary | ICD-10-CM

## 2013-12-15 DIAGNOSIS — F329 Major depressive disorder, single episode, unspecified: Secondary | ICD-10-CM

## 2013-12-15 DIAGNOSIS — G909 Disorder of the autonomic nervous system, unspecified: Secondary | ICD-10-CM

## 2013-12-15 DIAGNOSIS — E1143 Type 2 diabetes mellitus with diabetic autonomic (poly)neuropathy: Secondary | ICD-10-CM

## 2013-12-15 DIAGNOSIS — I10 Essential (primary) hypertension: Secondary | ICD-10-CM

## 2013-12-15 DIAGNOSIS — E230 Hypopituitarism: Secondary | ICD-10-CM

## 2013-12-15 DIAGNOSIS — R634 Abnormal weight loss: Secondary | ICD-10-CM

## 2013-12-15 DIAGNOSIS — I498 Other specified cardiac arrhythmias: Secondary | ICD-10-CM

## 2013-12-15 DIAGNOSIS — I4711 Inappropriate sinus tachycardia, so stated: Secondary | ICD-10-CM

## 2013-12-15 DIAGNOSIS — E063 Autoimmune thyroiditis: Secondary | ICD-10-CM

## 2013-12-15 DIAGNOSIS — E236 Other disorders of pituitary gland: Secondary | ICD-10-CM

## 2013-12-15 DIAGNOSIS — E049 Nontoxic goiter, unspecified: Secondary | ICD-10-CM

## 2013-12-15 DIAGNOSIS — IMO0002 Reserved for concepts with insufficient information to code with codable children: Secondary | ICD-10-CM

## 2013-12-15 DIAGNOSIS — R000 Tachycardia, unspecified: Secondary | ICD-10-CM

## 2013-12-15 DIAGNOSIS — E1149 Type 2 diabetes mellitus with other diabetic neurological complication: Secondary | ICD-10-CM

## 2013-12-15 DIAGNOSIS — F3289 Other specified depressive episodes: Secondary | ICD-10-CM

## 2013-12-15 DIAGNOSIS — E1065 Type 1 diabetes mellitus with hyperglycemia: Secondary | ICD-10-CM

## 2013-12-15 LAB — GLUCOSE, POCT (MANUAL RESULT ENTRY): POC GLUCOSE: 254 mg/dL — AB (ref 70–99)

## 2013-12-15 LAB — POCT GLYCOSYLATED HEMOGLOBIN (HGB A1C): Hemoglobin A1C: 7.7

## 2013-12-15 NOTE — Patient Instructions (Signed)
Follow up visit in 3 months. 

## 2013-12-15 NOTE — Progress Notes (Signed)
Subjective:  Patient Name: Scott Moran Date of Birth: 04-26-1991  MRN: [id number]  Scott Moran  presents to the office today for follow-up of his type 1 diabetes mellitus, goiter, hypoglycemia, depression, hypothyroidism, thyroiditis, diabetic autonomic neuropathy, inappropriate sinus tachycardia, non-proliferative diabetic retinopathy, diabetic peripheral neuropathy, fatigue, hypogonadotropic hypogonadism, and medical non-compliance.  HISTORY OF PRESENT ILLNESS:   Scott Moran is a 23 y.o. Caucasian young man. Scott Moran was unaccompanied.   1. I first saw the patient in consultation on 03/27/05 in the [facility] in [place], [place]. He had been referred by his primary care provider, Scott Moran, in [place], [place], for evaluation and treatment of poorly controlled type 1 diabetes. The patient was 23 years old.  A. The patient had been diagnosed with type 1 diabetes in February of 1995 at age 36. He was on an insulin pump when I first met him. His hemoglobin A1c had increased from 7.1% in December of 2005 to 8.2% in April 2006. He was having frequent snacks that he was not taking boluses for. He was also frequently noncompliant with checking blood sugars and taking meal boluses. I adjusted his basal rates.  B. At his next [facility] visit on 06/19/05, he was still very noncompliant. His mental status was normal. He had a 25 gram goiter. His father was serving in [place] with the [employer]. There was a lot of stress at home. I again adjusted his basal rates.  C. At the end of July 2006 we closed our satellite office in [place]. The patient elected to be followed at our clinic here in [place]. I have followed him here ever since.  2. During the past eight years, the patient has had several clinical issues.   A. T1DM: The patient's blood glucose control has usually been fairly good for a teenager. His maximum hemoglobin A1c occurred on 04/16/06 when  the hemoglobin A1c was 12.1%. Since then the hemoglobin A1c values have varied between 6.9% and 8.6%. He is still fairly noncompliant with checking blood sugars, bolusing, and changing his insulin pump sites on time, but he is more compliant and more careful than he used to be.   B. Goiter, Hashimoto's thyroiditis, and hypothyroidism: In late 2008 and early 2009 the patient developed hypothyroidism secondary to Hashimoto's disease. I started him on Synthroid, 25 mcg per day. When he takes his medication, he is euthyroid.  C. Autonomic neuropathy and inappropriate sinus tachycardia: In late 2008 he developed autonomic neuropathy which manifested as inappropriate sinus tachycardia.These problems vary in parallel with his BGs and HbA1c values.  D. Diabetic peripheral neuropathy: The patient has had symptoms and signs of mild diabetic peripheral neuropathy at several times in the past. His neuropathic symptoms similarly vary in parallel with his BGs and HbA1c values.   E. Non-proliferative diabetic retinopathy: This problem was identified during a diabetic eye exam in June 2010.  F. Hypogonadotropic hypogonadism: The patient has always been somewhat lackadaisical and relatively passive. On 06/29/10 his FSH was 4.9, LH was 2.4, testosterone was 399.66, and free testosterone was 79.8. The testosterone and free testosterone were relatively low for an 23 year old young man. At the same time the [facility] and LH while "normal", appeared to be somewhat low for the level of testosterone he had. We discussed the options for treatment including exercise and medication, to include AndroGel. The patient chose the exercise option. By 10/19/10 the [facility] was 6.8, the LH was 3.2, total testosterone was 615.88 and the free testosterone was  131.6. It may be that when he was more depressed he had less gonadotropic function. We will continue to follow up on this issue over time.  G. Depression:    1). The patient has been diagnosed with  depression at different times in the past. For some time he took Remeron (mirtazapine), but discontinued this in early 2012. I had been trying to convince him for some time that he was depressed and that he should seek out help. He eventually did decide to seek psychiatric help.    2). On 06/08/13 Scott Moran was evaluated by Dr. Carlena Moran, a noted psychiatrist in [place].  Dr. [name] and his staff performed neuropsychiatric testing. On the Scott Moran had problems with non-verbal reasoning and emotional intelligence. Dr. 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 Dr. [name] prescribed escitalopram, 10 mg/day. Scott Moran will continue to see Dr. [name] in follow up;.   3). Scott Moran was then referred to a therapist, Scott Moran. Scott Moran enjoys working with Dr. [name] and feels that he is making progress.    H. 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   3. The patient's last [facility] visit was on 10/15/13. In the interim, he has been healthy.   A. At his last visit we decreased his basal rates in order to reduce the frequency of his hypoglycemia. He feels that he is not having as much hypoglycemia.   Scott Moran continues to see Scott Moran, his therapist. Scott Moran likes Dr. [name] and is benefiting from Dr. Lorie Moran insights and advice. Since starting escitalopram, Scott Moran has felt better and has been more physically active.   C. He still has had a varied sleep pattern. His days and nights are still reversed.  Scott Moran takes his Synthroid dose of 25 mcg/day pretty reliably. He also fairly reliably takes his escitalopram.  He is less reliable with lisinopril 10 mg/day.   4. Pertinent Review of Systems:  Constitutional: The patient feels "pretty OK, great actually". His mood is better.  Eyes: Vision is good with  glasses. There are no significant eye complaints. Last eye exam was in November or December 2014. There were no signs of diabetic retinopathy. Neck: The patient has no complaints of anterior neck swelling, soreness, tenderness,  pressure, discomfort, or difficulty swallowing.  Heart: Heart rate sometimes feels fast. Heart rate increases with exercise or other physical activity. The patient has no complaints of palpitations, irregular heat beats, chest pain, or chest pressure. Gastrointestinal: Bowel movents seem normal. The patient has no complaints of excessive hunger, acid reflux, upset stomach, stomach aches or pains, diarrhea, or constipation. Legs: Muscle mass and strength seem normal. There are no complaints of numbness, tingling, burning, or pain. No edema is noted. Feet: There are no obvious foot problems. There are no complaints of numbness, tingling, burning, or pain. No edema is noted. Hypoglycemia: He has not having many low BGs. The low BGs he does have tend to occur when he has slept in very late. Emotional/psychological: The patient is doing better with medication and therapy.   6. BG printout: He is changing his sites ever 4-8+ days.  In the past month his average BG was 230, compared with 268 at last visit and with 297 at the visit prior.  His BG range is 61-383. He  has been checking BGs 0-6 times daily. He sometimes  goes up to 42 hours without checking BGs. He is not having as many high BGs or low BGs as before. He has had two BGs in the 70s and two in the 60s. He has been bolusing 0-5 times daily. Despite the fact that he is not as compliant as he should be, he is having many BGS in a good range. The  longer he leaves sites in, and the longer he goes between BG checks and insulin boluses, the more variable his BGs are.   PAST MEDICAL, FAMILY, AND SOCIAL HISTORY:  Past Medical History  Diagnosis Date  . Type 1 diabetes mellitus not at goal   . Hypoglycemia associated with diabetes    . Goiter   . Thyroiditis, autoimmune   . Hypothyroidism, acquired, autoimmune   . Tachycardia   . Hypertension   . Hypogonadotropic hypogonadism   . Type 1 diabetes mellitus with diabetic autonomic neuropathy   . DM type 1 with diabetic peripheral neuropathy     Family History  Problem Relation Age of Onset  . Diabetes Maternal Uncle   . Hypertension Maternal Grandmother   . Thyroid disease Neg Hx   . Obesity Neg Hx   . Kidney disease Neg Hx   . Cancer Neg Hx   . Anemia Neg Hx     Current outpatient prescriptions:escitalopram (LEXAPRO) 10 MG tablet, Take 10 mg by mouth daily., Disp: , Rfl: ;  glucagon (GLUCAGON EMERGENCY) 1 MG injection, Inject 1 mg intramuscularly in thigh 1 time for severe hypoglycemia if unresponsive, unconscious, unable to swallow or has a seizure., Disp: 2 kit, Rfl: 3 glucose blood (BAYER CONTOUR NEXT TEST) test strip, Use as instructed to check your blood sugar up to 6x daily. Please provide #90 day supply., Disp: 600 each, Rfl: 1;  insulin aspart (NOVOLOG) 100 UNIT/ML injection, 300 units in insulin pump every 48 to 72 hours and per Protocols for Hyperglycemia & DKA Outpatient Treatment., Disp: 12 vial, Rfl: 1 Insulin Pen Needle 31G X 5 MM MISC, BD Pen Needles- brand specific Inject insulin via insulin pen 7 x daily, Disp: 250 each, Rfl: 3;  levothyroxine (SYNTHROID, LEVOTHROID) 25 MCG tablet, Take 25 mcg by mouth daily before breakfast., Disp: , Rfl: ;  lisinopril (PRINIVIL,ZESTRIL) 10 MG tablet, Take 10 mg by mouth daily., Disp: , Rfl:   Allergies as of 12/15/2013  . (No Known Allergies)    1. Work and Family: Patient is no longer attending junior college, in part due to family financial problems. He is still not working and is not interested in looking for work. 2. Activities: Few physical activities. He is a history buff and frequently participates in online chat room discussions.. 3. Smoking, alcohol, or drugs: None 4. Primary Care Provider: He no longer  has a primary care provider.  REVIEW OF SYSTEMS: There are no other significant problems involving Scott Moran's other body systems.   Objective:  Vital Signs:  BP 130/84  Pulse 112  Wt 127 lb 12.8 oz (57.97 kg) His weight has increased 4 pounds since last visit.     Ht Readings from Last 3 Encounters:  08/22/13 5' 7" (1.702 m)   Wt Readings from Last 3 Encounters:  12/15/13 127 lb 12.8 oz (57.97 kg)  10/15/13 131 lb (59.421 kg)  08/28/13 127 lb 3.2 oz (57.698 kg)   PHYSICAL EXAM:  Constitutional: Scott Moran was very bright and alert today. He talked almost non-stop.  He appears healthy, but slim. He lost another 4 lbs. Face: The  face appears normal.  Eyes: There is no obvious arcus or proptosis. Moisture appears normal. Mouth: The oropharynx and tongue appear normal. Oral moisture is normal. Neck: The neck looks normal. No carotid bruits are noted. The thyroid gland is again only minimally enlarged at 20+ grams in size. The right lobe is at the upper limit of normal for size. The left lobe is mildly enlarged. The consistency of the thyroid gland is normal today.The thyroid gland is not tender to palpation. Lungs: The lungs are clear to auscultation. Air movement is good. Heart: Heart rate and rhythm are regular. Heart sounds S1 and S2 are normal. I did not appreciate any pathologic cardiac murmurs. Abdomen: The abdomen is normal in size. Bowel sounds are normal. There is no obvious hepatomegaly, splenomegaly, or other mass effect.  Arms: Muscle size and bulk are normal for age. Hands: There is no obvious tremor. Phalangeal and metacarpophalangeal joints are normal. Palmar muscles are normal. Palmar skin is normal. Palmar moisture is also normal. Legs: Muscles appear normal for age. No edema is present. Feet: Feet are normally formed. Dorsalis pedal pulses are faint 1+bilaterally.  Neurologic: Strength is normal for age in both the upper and lower extremities. Muscle tone is normal.  Sensation to touch is normal in both legs, but decreased in the left heel.     LAB DATA: Hemoglobin A1c was 7.7% today, compared with 8.6% at last visit when he was having many more low BGs and with 8.2% at the prior visit. He is doing better.   Labs 08/23/13: TSH 1.139; CMP normal except for a glucose of 145; CBC: WBC 5.4, Hgb 12.7, Hct 34.7%  Results for orders placed in visit on 12/15/13 (from the past 504 hour(s))  GLUCOSE, POCT (MANUAL RESULT ENTRY)   Collection Time    12/15/13  3:14 PM      Result Value Range   POC Glucose 254 (*) 70 - 99 mg/dl     Assessment and Plan:   ASSESSMENT:  1. Type 1 diabetes mellitus: Scott Moran is doing better overall. The variability in BG levels is not as severe as at last visit. Ironically, it would not take much more effort for him to have really great BGs.     2. Hypoglycemia: He has not had many low BGs since reducing his basal rates at last visit.   3. Autonomic neuropathy and inappropriate sinus tachycardia: These problems are somewhat better today due to him not having as many higher BGs recently. Fortunately, these problems will reverse with better BG control.  4. Peripheral neuropathy: This problem is about the same today. This problems is also equally reversible with improved BG control .   5. Hypothyroid: He was euthyroid 2  months ago. He say that he has been taking his Synthroid regularly.  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 We'll see how he does after another three months on escitalopram and therapy.  7. Hypertension: His BP is higher today, He needs to take lisinopril reliably. 8. Hashimoto's disease: Thyroiditis is clinically quiescent. 9. Depression: This problem is much better.  10. Noncompliance: I hope that he will do much better now.  11. Weight loss: His weight decreased 4 pounds since last visit. He is still under-insulinized at times.   PLAN:  1. Diagnostic: Annual surveillance labs,  testosterone, LH, Deepstep prior to next visit. Call in 4 weeks to discuss BGs.  2. Therapeutic: Continue Synthroid and lisinopril at the current doses. Continue the current basal rates:  MN:  0.750 5 AM: 0.750 8 AM: 0.800 Noon: 0.950 6 PM: 1.500 9 PM: 1.200 3. Patient education: We talked about his BG control and about how all of the long-term complications of DM can develop. He knows what he should do and knows how to do it. Now he needs to commit to action. 4. Follow-up: 8 weeks  Level of Service: This visit lasted in excess of 60 minutes. More than 50% of the visit was devoted to counseling.  Sherrlyn Hock, MD 12/15/2013 3:21 PM

## 2013-12-16 ENCOUNTER — Encounter: Payer: Self-pay | Admitting: "Endocrinology

## 2014-03-16 ENCOUNTER — Encounter: Payer: Self-pay | Admitting: "Endocrinology

## 2014-03-16 ENCOUNTER — Ambulatory Visit (INDEPENDENT_AMBULATORY_CARE_PROVIDER_SITE_OTHER): Payer: BC Managed Care – PPO | Admitting: "Endocrinology

## 2014-03-16 VITALS — BP 120/80 | HR 106 | Wt 130.0 lb

## 2014-03-16 DIAGNOSIS — I10 Essential (primary) hypertension: Secondary | ICD-10-CM

## 2014-03-16 DIAGNOSIS — E1042 Type 1 diabetes mellitus with diabetic polyneuropathy: Secondary | ICD-10-CM

## 2014-03-16 DIAGNOSIS — IMO0002 Reserved for concepts with insufficient information to code with codable children: Secondary | ICD-10-CM

## 2014-03-16 DIAGNOSIS — F3289 Other specified depressive episodes: Secondary | ICD-10-CM

## 2014-03-16 DIAGNOSIS — E1065 Type 1 diabetes mellitus with hyperglycemia: Secondary | ICD-10-CM

## 2014-03-16 DIAGNOSIS — E11649 Type 2 diabetes mellitus with hypoglycemia without coma: Secondary | ICD-10-CM

## 2014-03-16 DIAGNOSIS — Z91199 Patient's noncompliance with other medical treatment and regimen due to unspecified reason: Secondary | ICD-10-CM

## 2014-03-16 DIAGNOSIS — Z9119 Patient's noncompliance with other medical treatment and regimen: Secondary | ICD-10-CM

## 2014-03-16 DIAGNOSIS — F32A Depression, unspecified: Secondary | ICD-10-CM

## 2014-03-16 DIAGNOSIS — E038 Other specified hypothyroidism: Secondary | ICD-10-CM

## 2014-03-16 DIAGNOSIS — E1069 Type 1 diabetes mellitus with other specified complication: Secondary | ICD-10-CM

## 2014-03-16 DIAGNOSIS — E1169 Type 2 diabetes mellitus with other specified complication: Secondary | ICD-10-CM

## 2014-03-16 DIAGNOSIS — E236 Other disorders of pituitary gland: Secondary | ICD-10-CM

## 2014-03-16 DIAGNOSIS — E063 Autoimmune thyroiditis: Secondary | ICD-10-CM

## 2014-03-16 DIAGNOSIS — G909 Disorder of the autonomic nervous system, unspecified: Secondary | ICD-10-CM

## 2014-03-16 DIAGNOSIS — E1149 Type 2 diabetes mellitus with other diabetic neurological complication: Secondary | ICD-10-CM

## 2014-03-16 DIAGNOSIS — F329 Major depressive disorder, single episode, unspecified: Secondary | ICD-10-CM

## 2014-03-16 DIAGNOSIS — E230 Hypopituitarism: Secondary | ICD-10-CM

## 2014-03-16 DIAGNOSIS — I498 Other specified cardiac arrhythmias: Secondary | ICD-10-CM

## 2014-03-16 DIAGNOSIS — E1142 Type 2 diabetes mellitus with diabetic polyneuropathy: Secondary | ICD-10-CM

## 2014-03-16 DIAGNOSIS — E1049 Type 1 diabetes mellitus with other diabetic neurological complication: Secondary | ICD-10-CM

## 2014-03-16 DIAGNOSIS — R000 Tachycardia, unspecified: Secondary | ICD-10-CM

## 2014-03-16 DIAGNOSIS — E1143 Type 2 diabetes mellitus with diabetic autonomic (poly)neuropathy: Secondary | ICD-10-CM

## 2014-03-16 LAB — POCT GLYCOSYLATED HEMOGLOBIN (HGB A1C): Hemoglobin A1C: 8.2

## 2014-03-16 LAB — GLUCOSE, POCT (MANUAL RESULT ENTRY): POC Glucose: 177 mg/dl — AB (ref 70–99)

## 2014-03-16 MED ORDER — INSULIN PEN NEEDLE 31G X 5 MM MISC: Status: AC

## 2014-03-16 MED ORDER — INSULIN ASPART 100 UNIT/ML ~~LOC~~ SOLN: SUBCUTANEOUS | Status: DC

## 2014-03-16 MED ORDER — GLUCAGON (RDNA) 1 MG IJ KIT: PACK | INTRAMUSCULAR | Status: DC

## 2014-03-16 MED ORDER — LISINOPRIL 10 MG PO TABS: ORAL_TABLET | ORAL | Status: DC

## 2014-03-16 MED ORDER — GLUCOSE BLOOD VI STRP: ORAL_STRIP | Status: DC

## 2014-03-16 MED ORDER — ESCITALOPRAM OXALATE 10 MG PO TABS: 10.0000 mg | ORAL_TABLET | Freq: Every day | ORAL | Status: DC

## 2014-03-16 MED ORDER — LEVOTHYROXINE SODIUM 25 MCG PO TABS: ORAL_TABLET | ORAL | Status: DC

## 2014-03-16 NOTE — Progress Notes (Signed)
Subjective:  Patient Name: Scott Moran Date of Birth: 11/19/1991  MRN: [id number]  Scott Moran  presents to the office today for follow-up of his type 1 diabetes mellitus, goiter, hypoglycemia, depression, hypothyroidism, thyroiditis, diabetic autonomic neuropathy, inappropriate sinus tachycardia, non-proliferative diabetic retinopathy, diabetic peripheral neuropathy, fatigue, hypogonadotropic hypogonadism, and medical non-compliance.  HISTORY OF PRESENT ILLNESS:   Scott Moran is a 22 y.o. Caucasian young man. Scott Moran was unaccompanied.   1. I first saw the patient in consultation on 03/27/05 in the [facility] in [place], [place]. He had been referred by his primary care provider, [name], in [place], [place], for evaluation and treatment of poorly controlled type 1 diabetes. The patient was 23 years old.  A. The patient had been diagnosed with type 1 diabetes in February of 1995 at age 38. He was on an insulin pump when I first met him. His hemoglobin A1c had increased from 7.1% in December of 2005 to 8.2% in April 2006. He was having frequent snacks that he was not taking boluses for. He was also frequently noncompliant with checking blood sugars and taking meal boluses. I adjusted his basal rates.  B. At his next [facility] visit on 06/19/05, he was still very noncompliant. His mental status was normal. He had a 25 gram goiter. His father was serving in [place] with the [employer]. There was a lot of stress at home. I again adjusted his basal rates.  C. At the end of July 2006 we closed our satellite office in [place]. The patient elected to be followed at our clinic here in [place]. I have followed him here ever since.  2. During the past nine years, the patient has had several clinical issues.   A. T1DM: The patient's blood glucose control has usually been fairly good for a teenager. His maximum hemoglobin A1c occurred on 04/16/06 when the  hemoglobin A1c was 12.1%. Since then the hemoglobin A1c values have varied between 6.9% and 8.6%. He is still fairly noncompliant with checking blood sugars, bolusing, and changing his insulin pump sites on time, but he is more compliant and more careful than he used to be.   B. Goiter, Hashimoto's thyroiditis, and hypothyroidism: In late 2008 and early 2009 the patient developed hypothyroidism secondary to Hashimoto's disease. I started him on Synthroid, 25 mcg per day. When he takes his medication, he is euthyroid.  C. Autonomic neuropathy and inappropriate sinus tachycardia: In late 2008 he developed autonomic neuropathy which manifested as inappropriate sinus tachycardia.These problems vary in parallel with his BGs and HbA1c values.  D. Diabetic peripheral neuropathy: The patient has had symptoms and signs of mild diabetic peripheral neuropathy at several times in the past. His neuropathic symptoms similarly vary in parallel with his BGs and HbA1c values.   E. Non-proliferative diabetic retinopathy: This problem was identified during a diabetic eye exam in June 2010.  F. Hypogonadotropic hypogonadism: The patient has always been somewhat lackadaisical and relatively passive. On 06/29/10 his FSH was 4.9, LH was 2.4, testosterone was 399.66, and free testosterone was 79.8. The testosterone and free testosterone were relatively low for an 23 year old young man. At the same time the [facility] and LH while "normal", appeared to be somewhat inappropriately low for the level of testosterone he had. We discussed the options for treatment including exercise and medication, to include AndroGel. The patient chose the exercise option. By 10/19/10 the [facility] was 6.8, the LH was 3.2, total testosterone was 615.88 and the free testosterone  was 131.6. It may be that when he was more depressed he had less gonadotropic function. We will continue to follow up on this issue over time.  G. Depression:    1). The patient has been  diagnosed with depression at different times in the past. For some time he took Remeron (mirtazapine), but discontinued this in early 2012. I had been trying to convince him for some time that he was depressed and that he should seek out help. He eventually did decide to seek psychiatric help.    2). On 06/08/13 Scott Moran was evaluated by [name], a noted psychiatrist in [place].  [name] and his staff performed neuropsychiatric testing. On the MOUSA PROUT had problems with non-verbal reasoning and emotional intelligence. 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 [name] prescribed escitalopram, 10 mg/day. Scott Moran will continue to see [name] in follow up;.   3). Scott Moran was then referred to a therapist, [name]. Nehemyah enjoys working with Dr. [name] and feels that he is making progress.    H. 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   3. The patient's last [facility] visit was on 12/15/13. In the interim, he has been healthy. He says that he has not been checking his BGs as often as he should.   A. At his last visit we continued his basal rates. He feels that he is not having as much hypoglycemia.   Scott Moran has not been able to see [name], his therapist, recently due to mom having ortho problems and not being able to drive. Scott Moran does not have his own license to drive. Since starting escitalopram, Scott Moran has felt better and has been more physically active.   C. He still has had a varied sleep pattern. He usually awakens between 10 AM-1 PM.   D. Burr takes his Synthroid dose of 25 mcg/day pretty reliably. He also fairly reliably takes his escitalopram.  He is less reliable with lisinopril 10 mg/day.   4. Pertinent Review of Systems:  Constitutional: The patient  feels "pretty good". His mood is better, bu, "I don't have much drive to do anything." Eyes: Vision is good with his new glasses. There are no significant eye complaints. Last eye exam was in November or December 2014. There were no signs of diabetic retinopathy. Neck: The patient has no complaints of anterior neck swelling, soreness, tenderness,  pressure, discomfort, or difficulty swallowing.  Heart: Heart rate sometimes feels fast. Heart rate increases with exercise or other physical activity. The patient has no complaints of palpitations, irregular heat beats, chest pain, or chest pressure. Gastrointestinal: Bowel movents seem normal. The patient has no complaints of excessive hunger, acid reflux, upset stomach, stomach aches or pains, diarrhea, or constipation. Legs: Muscle mass and strength seem normal. There are no complaints of numbness, tingling, burning, or pain. No edema is noted. Feet: There are no obvious foot problems. There are no complaints of numbness, tingling, burning, or pain. No edema is noted. Hypoglycemia: He has not having many low BGs that he is aware of.  Emotional/psychological: The patient is doing better with medication and therapy.   6. BG printout: He is changing his sites every 2-8+ days. In the past month his average BG was 253, compared with 230 at last visit and with  268 at the visit  prior.  His BG range is 59- > 400, compared with 61-383 at last visit. He  has been checking BGs 0-6 times daily. He sometimes goes up to 36 hours without checking BGs, compared to 42 hours at last visit. He has been bolusing 0-8 times daily. The  longer he leaves sites in, and the longer he goes between BG checks and insulin boluses, the more variable his BGs are.   PAST MEDICAL, FAMILY, AND SOCIAL HISTORY:  Past Medical History  Diagnosis Date  . Type 1 diabetes mellitus not at goal   . Hypoglycemia associated with diabetes   . Goiter   . Thyroiditis, autoimmune   .  Hypothyroidism, acquired, autoimmune   . Tachycardia   . Hypertension   . Hypogonadotropic hypogonadism   . Type 1 diabetes mellitus with diabetic autonomic neuropathy   . DM type 1 with diabetic peripheral neuropathy     Family History  Problem Relation Age of Onset  . Diabetes Maternal Uncle   . Hypertension Maternal Grandmother   . Thyroid disease Neg Hx   . Obesity Neg Hx   . Kidney disease Neg Hx   . Cancer Neg Hx   . Anemia Neg Hx     Current outpatient prescriptions:escitalopram (LEXAPRO) 10 MG tablet, Take 10 mg by mouth daily., Disp: , Rfl: ;  glucagon (GLUCAGON EMERGENCY) 1 MG injection, Inject 1 mg intramuscularly in thigh 1 time for severe hypoglycemia if unresponsive, unconscious, unable to swallow or has a seizure., Disp: 2 kit, Rfl: 3 glucose blood (BAYER CONTOUR NEXT TEST) test strip, Use as instructed to check your blood sugar up to 6x daily. Please provide #90 day supply., Disp: 600 each, Rfl: 1;  insulin aspart (NOVOLOG) 100 UNIT/ML injection, 300 units in insulin pump every 48 to 72 hours and per Protocols for Hyperglycemia & DKA Outpatient Treatment., Disp: 12 vial, Rfl: 1 Insulin Pen Needle 31G X 5 MM MISC, BD Pen Needles- brand specific Inject insulin via insulin pen 7 x daily, Disp: 250 each, Rfl: 3;  levothyroxine (SYNTHROID, LEVOTHROID) 25 MCG tablet, Take 25 mcg by mouth daily before breakfast., Disp: , Rfl: ;  lisinopril (PRINIVIL,ZESTRIL) 10 MG tablet, Take 10 mg by mouth daily., Disp: , Rfl:   Allergies as of 03/16/2014  . (No Known Allergies)    1. Work and Family: Patient is no longer attending junior college, in part due to family financial problems. He is still not working and is not interested in looking for work. 2. Activities: Few physical activities. He is a history buff and frequently participates in online chat room discussions.. 3. Smoking, alcohol, or drugs: None 4. Primary Care Provider: He no longer has a primary care provider.  REVIEW OF  SYSTEMS: There are no other significant problems involving Scott Moran's other body systems.   Objective:  Vital Signs:  BP 120/80  Pulse 106  Wt 130 lb (58.968 kg) His weight has increased 4 pounds since last visit.     Ht Readings from Last 3 Encounters:  08/22/13 5\' 7"  (1.702 m)   Wt Readings from Last 3 Encounters:  03/16/14 130 lb (58.968 kg)  12/15/13 127 lb 12.8 oz (57.97 kg)  10/15/13 131 lb (59.421 kg)   PHYSICAL EXAM:  Constitutional: Scott Moran was very bright and alert today. He again talked almost non-stop. He appears healthy, but slim. He has re-gained 3 pounds since last visit.  Face: The face appears normal.  Eyes: There is no obvious arcus or proptosis. Moisture appears normal.  Mouth: The oropharynx and tongue appear normal. Oral moisture is normal. Neck: The neck looks normal. No carotid bruits are noted. The thyroid gland is smaller at 18-20 rams in size today.  Both lobes are within normal limits for size.  The right lobe is at the upper limit of normal for size. The consistency of the thyroid gland is normal today.The thyroid gland is not tender to palpation. Lungs: The lungs are clear to auscultation. Air movement is good. Heart: Heart rate and rhythm are regular. Heart sounds S1 and S2 are normal. I did not appreciate any pathologic cardiac murmurs. Abdomen: The abdomen is normal in size. Bowel sounds are normal. There is no obvious hepatomegaly, splenomegaly, or other mass effect.  Arms: Muscle size and bulk are normal for age. Hands: There is no obvious tremor. Phalangeal and metacarpophalangeal joints are normal. Palmar muscles are normal. Palmar skin is normal. Palmar moisture is also normal. Legs: Muscles appear normal for age. No edema is present. Feet: Feet are normally formed. Dorsalis pedal pulses are faint 1+bilaterally.  Neurologic: Strength is normal for age in both the upper and lower extremities. Muscle tone is normal. Sensation to touch is normal in both  legs, but decreased in both heels.     LAB DATA: Hemoglobin A1c was 8.2% today, compared with 7.7% at last visit when he was having many more low BGs and with 8.6% at the prior visit. He is doing somewhat worse.   Labs 08/23/13: TSH 1.139; CMP normal except for a glucose of 145; CBC: WBC 5.4, Hgb 12.7, Hct 34.7%  Results for orders placed in visit on 03/16/14 (from the past 504 hour(s))  GLUCOSE, POCT (MANUAL RESULT ENTRY)   Collection Time    03/16/14  2:48 PM      Result Value Ref Range   POC Glucose 177 (*) 70 - 99 mg/dl     Assessment and Plan:   ASSESSMENT:  1. Type 1 diabetes mellitus: Scott Moran is doing worse again. His non-compliance is the major factor in his BG control. Ironically, it would not take much more effort to attain good BG control.  2. Hypoglycemia: He has had several low BGs since last visit.   3. Autonomic neuropathy and inappropriate sinus tachycardia: These problems are somewhat worse today due to him having more higher BGs recently. Fortunately, these problems will reverse with better BG control.  4. Peripheral neuropathy: This problem is somewhat worse today. This problems is also equally reversible with improved BG control .   5. Hypothyroid: He was euthyroid 7  months ago. He say that he has been taking his Synthroid regularly. Since the lab tests that were ordered were not done prior to this appointment, I will re-write the orders to have them done.  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 We'll see how he is doing today.   7. Hypertension: His BP is lower today. He needs to take lisinopril reliably. 8. Hashimoto's disease: Thyroiditis is clinically quiescent. 9. Depression: This problem is much better. Unfortunately, the improvement in depression has not resulted in any effort on his part to take charge of his T1DM, to find a job, or to pursue his education. We talked at length about how historical persons whom he is familiar  with had taken actions to change their worlds and themselves. Because he enjoys history, I will continue to use historical persons to inspire him to take a more active role in his DM care and to move on with his life.  10. Noncompliance: I continue to hope that as he ages and matures, he will do a better job of taking care of his T1DM and his life.much better.  11. Weight loss, unintentional: His weight increased 3 pounds since last visit.    PLAN:  1. Diagnostic: Annual surveillance labs, testosterone, LH, FSH now. Call in 4 weeks to discuss BGs.  2. Therapeutic: Continue Synthroid and lisinopril at the current doses. Continue the current basal rates:  MN: 0.750 5 AM: 0.750 8 AM: 0.800 Noon: 0.950 6 PM: 1.500 9 PM: 1.200 3. Patient education: We talked about his BG control and about how all of the long-term complications of DM can develop. He knows what he should do and knows how to do it. Now he needs to commit to action. 4. Follow-up: 8 weeks 5. Homework assignment: Who am I? Accept the mission to take control of your life.   Level of Service: This visit lasted in excess of 60 minutes. More than 50% of the visit was devoted to counseling.  Sherrlyn Hock, MD 03/16/2014 2:57 PM

## 2014-03-16 NOTE — Patient Instructions (Signed)
Follow up visit in 3 months. Call in 4 weeks to discuss BGs.

## 2014-03-30 LAB — LIPID PANEL
CHOLESTEROL: 163 mg/dL (ref 0–200)
HDL: 64 mg/dL (ref >39)
LDL Cholesterol: 88 mg/dL (ref 0–99)
TRIGLYCERIDES: 55 mg/dL (ref <150)
Total CHOL/HDL Ratio: 2.5 Ratio
VLDL: 11 mg/dL (ref 0–40)

## 2014-03-30 LAB — T4, FREE: Free T4: 1.12 ng/dL (ref 0.80–1.80)

## 2014-03-30 LAB — COMPREHENSIVE METABOLIC PANEL WITH GFR
ALT: 17 U/L (ref 0–53)
AST: 15 U/L (ref 0–37)
Albumin: 4.6 g/dL (ref 3.5–5.2)
Alkaline Phosphatase: 59 U/L (ref 39–117)
BUN: 11 mg/dL (ref 6–23)
CO2: 28 meq/L (ref 19–32)
Calcium: 9.9 mg/dL (ref 8.4–10.5)
Chloride: 101 meq/L (ref 96–112)
Creat: 0.82 mg/dL (ref 0.50–1.35)
Glucose, Bld: 203 mg/dL — ABNORMAL HIGH (ref 70–99)
Potassium: 4.4 meq/L (ref 3.5–5.3)
Sodium: 138 meq/L (ref 135–145)
Total Bilirubin: 0.9 mg/dL (ref 0.2–1.2)
Total Protein: 7.5 g/dL (ref 6.0–8.3)

## 2014-03-30 LAB — TSH: TSH: 1.935 u[IU]/mL (ref 0.350–4.500)

## 2014-03-30 LAB — T3, FREE: T3, Free: 3.1 pg/mL (ref 2.3–4.2)

## 2014-03-31 LAB — FOLLICLE STIMULATING HORMONE: FSH: 6.3 m[IU]/mL (ref 1.4–18.1)

## 2014-03-31 LAB — TESTOSTERONE, FREE, TOTAL, SHBG
Sex Hormone Binding: 41 nmol/L (ref 13–71)
TESTOSTERONE: 624 ng/dL (ref 300–890)
Testosterone, Free: 118.8 pg/mL (ref 47.0–244.0)
Testosterone-% Free: 1.9 % (ref 1.6–2.9)

## 2014-03-31 LAB — THYROID PEROXIDASE ANTIBODY

## 2014-03-31 LAB — MICROALBUMIN / CREATININE URINE RATIO
CREATININE, URINE: 267 mg/dL
MICROALB/CREAT RATIO: 6.4 mg/g (ref 0.0–30.0)
Microalb, Ur: 1.7 mg/dL (ref 0.00–1.89)

## 2014-03-31 LAB — LUTEINIZING HORMONE: LH: 2.4 m[IU]/mL (ref 1.5–9.3)

## 2014-04-05 ENCOUNTER — Telehealth: Payer: Self-pay | Admitting: "Endocrinology

## 2014-04-05 ENCOUNTER — Other Ambulatory Visit: Payer: Self-pay | Admitting: *Deleted

## 2014-04-05 DIAGNOSIS — E1065 Type 1 diabetes mellitus with hyperglycemia: Secondary | ICD-10-CM

## 2014-04-05 DIAGNOSIS — IMO0002 Reserved for concepts with insufficient information to code with codable children: Secondary | ICD-10-CM

## 2014-04-05 DIAGNOSIS — E1069 Type 1 diabetes mellitus with other specified complication: Secondary | ICD-10-CM

## 2014-04-05 MED ORDER — INSULIN ASPART 100 UNIT/ML ~~LOC~~ SOLN: SUBCUTANEOUS | Status: DC

## 2014-04-05 MED ORDER — GLUCOSE BLOOD VI STRP: ORAL_STRIP | Status: DC

## 2014-04-05 NOTE — Telephone Encounter (Signed)
meds sent to [facility] via [facility]. KW

## 2014-04-14 ENCOUNTER — Encounter: Payer: Self-pay | Admitting: *Deleted

## 2014-06-16 ENCOUNTER — Ambulatory Visit: Payer: BC Managed Care – PPO | Admitting: "Endocrinology

## 2014-08-26 ENCOUNTER — Ambulatory Visit (INDEPENDENT_AMBULATORY_CARE_PROVIDER_SITE_OTHER): Payer: BC Managed Care – PPO | Admitting: "Endocrinology

## 2014-08-26 VITALS — BP 116/82 | HR 112 | Wt 128.0 lb

## 2014-08-26 DIAGNOSIS — G909 Disorder of the autonomic nervous system, unspecified: Secondary | ICD-10-CM

## 2014-08-26 DIAGNOSIS — Z9119 Patient's noncompliance with other medical treatment and regimen: Secondary | ICD-10-CM

## 2014-08-26 DIAGNOSIS — E1042 Type 1 diabetes mellitus with diabetic polyneuropathy: Secondary | ICD-10-CM

## 2014-08-26 DIAGNOSIS — IMO0002 Reserved for concepts with insufficient information to code with codable children: Secondary | ICD-10-CM | POA: Diagnosis not present

## 2014-08-26 DIAGNOSIS — E1069 Type 1 diabetes mellitus with other specified complication: Secondary | ICD-10-CM | POA: Diagnosis not present

## 2014-08-26 DIAGNOSIS — E1049 Type 1 diabetes mellitus with other diabetic neurological complication: Secondary | ICD-10-CM | POA: Diagnosis not present

## 2014-08-26 DIAGNOSIS — E049 Nontoxic goiter, unspecified: Secondary | ICD-10-CM | POA: Diagnosis not present

## 2014-08-26 DIAGNOSIS — F3289 Other specified depressive episodes: Secondary | ICD-10-CM

## 2014-08-26 DIAGNOSIS — Z9114 Patient's other noncompliance with medication regimen: Secondary | ICD-10-CM

## 2014-08-26 DIAGNOSIS — E1065 Type 1 diabetes mellitus with hyperglycemia: Secondary | ICD-10-CM | POA: Diagnosis not present

## 2014-08-26 DIAGNOSIS — E1142 Type 2 diabetes mellitus with diabetic polyneuropathy: Secondary | ICD-10-CM

## 2014-08-26 DIAGNOSIS — R000 Tachycardia, unspecified: Secondary | ICD-10-CM

## 2014-08-26 DIAGNOSIS — I10 Essential (primary) hypertension: Secondary | ICD-10-CM

## 2014-08-26 DIAGNOSIS — Z91199 Patient's noncompliance with other medical treatment and regimen due to unspecified reason: Secondary | ICD-10-CM

## 2014-08-26 DIAGNOSIS — I498 Other specified cardiac arrhythmias: Secondary | ICD-10-CM

## 2014-08-26 DIAGNOSIS — F32A Depression, unspecified: Secondary | ICD-10-CM

## 2014-08-26 DIAGNOSIS — E063 Autoimmune thyroiditis: Secondary | ICD-10-CM

## 2014-08-26 DIAGNOSIS — E10649 Type 1 diabetes mellitus with hypoglycemia without coma: Secondary | ICD-10-CM

## 2014-08-26 DIAGNOSIS — E230 Hypopituitarism: Secondary | ICD-10-CM

## 2014-08-26 DIAGNOSIS — E162 Hypoglycemia, unspecified: Secondary | ICD-10-CM

## 2014-08-26 DIAGNOSIS — F329 Major depressive disorder, single episode, unspecified: Secondary | ICD-10-CM

## 2014-08-26 DIAGNOSIS — E1043 Type 1 diabetes mellitus with diabetic autonomic (poly)neuropathy: Secondary | ICD-10-CM

## 2014-08-26 DIAGNOSIS — E291 Testicular hypofunction: Secondary | ICD-10-CM

## 2014-08-26 LAB — GLUCOSE, POCT (MANUAL RESULT ENTRY): POC Glucose: 132 mg/dl — AB (ref 70–99)

## 2014-08-26 LAB — POCT GLYCOSYLATED HEMOGLOBIN (HGB A1C): Hemoglobin A1C: 8.6

## 2014-08-26 NOTE — Patient Instructions (Addendum)
Follow up visit in 3 months.Please have lab tests drawn one week prior to next visit. Call Dr. Fransico Bronte on a Wednesday or Sunday evening between 8-10 PM to discuss BG.

## 2014-08-26 NOTE — Progress Notes (Signed)
Subjective:  Patient Name: Scott Moran Date of Birth: 12/05/1990  MRN: [id number]  Scott Moran  presents to the office today for follow-up of his type 1 diabetes mellitus, goiter, hypoglycemia, depression, hypothyroidism, thyroiditis, diabetic autonomic neuropathy, inappropriate sinus tachycardia, non-proliferative diabetic retinopathy, diabetic peripheral neuropathy, fatigue, hypogonadotropic hypogonadism, and medical non-compliance.  HISTORY OF PRESENT ILLNESS:   [name] is a 23 y.o. Caucasian young man. [name] was unaccompanied.   1. I first saw the patient in consultation on 03/27/05 in the [facility] in [place], [place]. He had been referred by his primary care provider, [name], in [place], [place], for evaluation and treatment of poorly controlled type 1 diabetes. The patient was 23 years old.  A. The patient had been diagnosed with type 1 diabetes in February of 1995 at age 41. He was on an insulin pump when I first met him. His hemoglobin A1c had increased from 7.1% in December of 2005 to 8.2% in April 2006. He was having frequent snacks that he was not taking boluses for. He was also frequently noncompliant with checking blood sugars and taking meal boluses. I adjusted his basal rates.  B. At his next [facility] visit on 06/19/05, he was still very noncompliant. His mental status was normal. He had a 25 gram goiter. His father was serving in [place] with the [employer]. There was a lot of stress at home. I again adjusted his basal rates.  C. At the end of July 2006 we closed our satellite office in [place]. The patient elected to be followed at our clinic here in [place]. I have followed him here ever since.  2. During the past nine years, the patient has had several clinical issues.   A. T1DM: The patient's blood glucose control has usually been fairly good for a teenager. His maximum hemoglobin A1c occurred on 04/16/06 when the  hemoglobin A1c was 12.1%. Since then the hemoglobin A1c values have varied between 6.9% and 8.6%. He is still fairly noncompliant with checking blood sugars, bolusing, and changing his insulin pump sites on time, but he is more compliant and more careful than he used to be.   B. Goiter, Hashimoto's thyroiditis, and hypothyroidism: In late 2008 and early 2009 the patient developed hypothyroidism secondary to Hashimoto's disease. I started him on Synthroid, 25 mcg per day. When he takes his medication, he is euthyroid.  C. Autonomic neuropathy and inappropriate sinus tachycardia: In late 2008 he developed autonomic neuropathy which manifested as inappropriate sinus tachycardia.These problems varied in parallel with his BGs and HbA1c values.  D. Diabetic peripheral neuropathy: The patient has had symptoms and signs of mild diabetic peripheral neuropathy at several times in the past. His neuropathic symptoms similarly varied in parallel with his BGs and HbA1c values.   E. Non-proliferative diabetic retinopathy: This problem was identified during a diabetic eye exam in June 2010.  F. Hypogonadotropic hypogonadism: The patient has always been somewhat lackadaisical and relatively passive. On 06/29/10 his FSH was 4.9, LH was 2.4, testosterone was 399.66, and free testosterone was 79.8. The testosterone and free testosterone were relatively low for an 23 year old young man. At the same time the [facility] and LH while "normal", appeared to be somewhat inappropriately low for the level of testosterone he had. We discussed the options for treatment including exercise and medication, to include AndroGel. The patient chose the exercise option. By 10/19/10 the [facility] was 6.8, the LH was 3.2, total testosterone was 615.88 and the free testosterone  was 131.6. It may be that when he was more depressed he had less gonadotropic function. We will continue to follow up on this issue over time.  G. Depression/Personality disorders:    1).  The patient has been diagnosed with depression at different times in the past. For some time he took Remeron (mirtazapine), but discontinued this in early 2012. I had been trying to convince him for some time that he was depressed and that he should seek out help. He eventually did decide to seek psychiatric help.    2). On 06/08/13 Xander was evaluated by [name], a noted psychiatrist in [place].  [name] and his staff performed neuropsychiatric testing. On the ANDREN BETHEA had problems with non-verbal reasoning and emotional intelligence. 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 [name] prescribed escitalopram, 10 mg/day. Sione will continue to see [name] in follow up;.   3). Cainan was then referred to a therapist, [name]. Madix enjoys working with Dr. [name] and feels that he is making progress.    H. 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   3. The patient's last [facility] visit was on 03/16/14. In the interim, he has been healthy. He says that he has not been checking his BGs as often as he should.   A. At his last visit we continued his basal rates. He feels that he is not having as much hypoglycemia.   [name] saw [name], his therapist, again last week. Dr. Lonia Blood asked him to resume taking his Synthroid regularly, to resume his lisinopril, to check his BGs regularly, to take insulin regularly, and to resume taking his escitalopram regularly.    C. His sleep pattern since re-starting Synthroid is much better.   Juanda Bond now takes his Synthroid dose of 25 mcg/day pretty reliably. He will soon resume taking lisinopril 10 mg/day. He will later resume taking escitalopram, 10 mg/day.  4. Pertinent Review of Systems:  Constitutional: The patient feels  "pretty OK" since resuming Synthroid. His mood is "pretty good overall".  "I don't have much drive to do anything." Eyes: Vision is good with his new glasses. There are no significant eye complaints. Last eye exam was in November or December 2014. There were no signs of diabetic retinopathy. Ears: He was trying to clean out his ears with his fingers recently, but now has a sensation of fullness in the right ear.  Neck: The patient has no complaints of anterior neck swelling, soreness, tenderness,  pressure, discomfort, or difficulty swallowing.  Heart: Heart rate sometimes feels fast. Heart rate increases with exercise or other physical activity. The patient has no complaints of palpitations, irregular heat beats, chest pain, or chest pressure. Gastrointestinal: Bowel movents seem normal. The patient has no complaints of excessive hunger, acid reflux, upset stomach, stomach aches or pains, diarrhea, or constipation. Legs: Muscle mass and strength seem normal. There are no complaints of numbness, tingling, burning, or pain. No edema is noted. Feet: There are no obvious foot problems. There are no complaints of numbness, tingling, burning, or pain. No edema is noted. Hypoglycemia: He has not having many low BGs that he is aware of.  Emotional/psychological: The patient is doing better.   6. BG printout: He is changing his sites every 4-8 days. In the past month his average BG  was 249, compared with 253 at last visit and with  230 at the visit prior. His BG range is 68- > 400, compared with 59- >400 at last visit. He  has been checking BGs 0-5 times daily. He sometimes goes up to 72 hours without checking BGs, compared to 36 hours at last visit. He has been bolusing 0-5 times daily. He went up to 84 hours between boluses at one time this month. The  longer he leaves sites in, and the longer he goes between BG checks and insulin boluses, the more variable his BGs are.   PAST MEDICAL, FAMILY, AND SOCIAL  HISTORY:  Past Medical History  Diagnosis Date  . Type 1 diabetes mellitus not at goal   . Hypoglycemia associated with diabetes   . Goiter   . Thyroiditis, autoimmune   . Hypothyroidism, acquired, autoimmune   . Tachycardia   . Hypertension   . Hypogonadotropic hypogonadism   . Type 1 diabetes mellitus with diabetic autonomic neuropathy   . DM type 1 with diabetic peripheral neuropathy     Family History  Problem Relation Age of Onset  . Diabetes Maternal Uncle   . Hypertension Maternal Grandmother   . Thyroid disease Neg Hx   . Obesity Neg Hx   . Kidney disease Neg Hx   . Cancer Neg Hx   . Anemia Neg Hx     Current outpatient prescriptions:glucagon (GLUCAGON EMERGENCY) 1 MG injection, Inject 1 mg intramuscularly in thigh 1 time for severe hypoglycemia if unresponsive, unconscious, unable to swallow or has a seizure., Disp: 2 kit, Rfl: 3;  glucose blood (BAYER CONTOUR NEXT TEST) test strip, Use as instructed to check your blood sugar up to 6x daily. Please provide #90 day supply., Disp: 600 each, Rfl: 4 insulin aspart (NOVOLOG) 100 UNIT/ML injection, 300 units in insulin pump every 48 to 72 hours and per Protocols for Hyperglycemia & DKA Outpatient Treatment., Disp: 12 vial, Rfl: 4;  Insulin Pen Needle 31G X 5 MM MISC, BD Pen Needles- brand specific Inject insulin via insulin pen 7 x daily, Disp: 750 each, Rfl: 3;  levothyroxine (SYNTHROID, LEVOTHROID) 25 MCG tablet, Take one tablet daily., Disp: 90 tablet, Rfl: 3 escitalopram (LEXAPRO) 10 MG tablet, Take 1 tablet (10 mg total) by mouth daily., Disp: 90 tablet, Rfl: 3;  lisinopril (PRINIVIL,ZESTRIL) 10 MG tablet, Take one tablet daily., Disp: 90 tablet, Rfl: 3  Allergies as of 08/26/2014  . (No Known Allergies)    1. Work and Family: Patient is no longer attending junior college, in part due to family financial problems. He is still not working and is not interested in looking for work. 2. Activities: Few physical activities. He  is a history buff and frequently participates in online chat room discussions.. 3. Smoking, alcohol, or drugs: None 4. Primary Care Provider: He no longer has a primary care provider.  REVIEW OF SYSTEMS: There are no other significant problems involving Ahamed's other body systems.   Objective:  Vital Signs:  BP 116/82  Pulse 112  Wt 128 lb (58.06 kg)    Ht Readings from Last 3 Encounters:  08/22/13 5\' 7"  (1.702 m)   Wt Readings from Last 3 Encounters:  08/26/14 128 lb (58.06 kg)  03/16/14 130 lb (58.968 kg)  12/15/13 127 lb 12.8 oz (57.97 kg)   PHYSICAL EXAM:  Constitutional: Carr was bright and alert today. He knows what he should do to help himself, but he can't seem to do what he should do.  He appears healthy, but slim. His weight has decreased 2 pounds since last visit.   Face: The face appears normal.  Eyes: There is no obvious arcus or proptosis. Moisture appears normal. Mouth: The oropharynx and tongue appear normal. Oral moisture is normal. Neck: The neck looks normal. No carotid bruits are noted. The thyroid gland is larger at about 22 grams in size today.  Both lobes are fairly symmetrically enlarged. The consistency of the thyroid gland is normal today.The thyroid gland is not tender to palpation. Lungs: The lungs are clear to auscultation. Air movement is good. Heart: Heart rate and rhythm are regular. Heart sounds S1 and S2 are normal. I did not appreciate any pathologic cardiac murmurs. Abdomen: The abdomen is normal in size. Bowel sounds are normal. There is no obvious hepatomegaly, splenomegaly, or other mass effect.  Arms: Muscle size and bulk are normal for age. Hands: There is no obvious tremor. Phalangeal and metacarpophalangeal joints are normal. Palmar muscles are normal. Palmar skin is normal. Palmar moisture is also normal. Legs: Muscles appear normal for age. No edema is present. Feet: Feet are normally formed. Dorsalis pedal pulses are 1+ on the right  and faint 1+ on the left.  Neurologic: Strength is normal for age in both the upper and lower extremities. Muscle tone is normal. Sensation to touch is normal in both legs, but decreased in both heels.     LAB DATA: Hemoglobin A1c was 8.6% today, compared with 8.2% at last visit and with 7.7% at the prior visit. He is doing worse.   Labs 03/30/14: TSH 1.935, free T4 1.12, free T3 3.2; CMP: normal except for glucose 203; testosterone 624, FSH 6.3, LH 2.4; urine microalbumin/creatinine ratio 6.4; cholesterol 163, triglycerides 55, HDL 64, LDL 80  Labs 08/23/13: TSH 1.139; CMP normal except for a glucose of 145; CBC: WBC 5.4, Hgb 12.7, Hct 34.7%  Results for orders placed in visit on 08/26/14 (from the past 504 hour(s))  GLUCOSE, POCT (MANUAL RESULT ENTRY)   Collection Time    08/26/14  1:19 PM      Result Value Ref Range   POC Glucose 132 (*) 70 - 99 mg/dl     Assessment and Plan:   ASSESSMENT:  1. Type 1 diabetes mellitus: Keniel is doing worse again. His non-compliance is the major factor in his BG control. Ironically, it would not take much more effort to attain good BG control.  2. Hypoglycemia: He has had several low BGs since last visit, usually associated with erratic eating, erratic BG checking, and erratic  insulin dosing.   3. Autonomic neuropathy and inappropriate sinus tachycardia: These problems are somewhat worse today due to him having more higher BGs recently. Fortunately, these problems will reverse with better BG control.  4. Peripheral neuropathy: This problem is about the same today. This problems is also equally reversible with improved BG control .   5. Hypothyroid: He was euthyroid 5  months ago. He admits that he was not taking his Synthroid regularly, but has recently tried to do so.  6. Hypogonadotropic hypogonadism: His hypogonadism has improved significantly as his depression has improved.  7. Hypertension: His SBP is lower today, but his DBP is still too high.  Exercising for 30-45 minutes per day will help a lot. Resuming lisinopril would help even more.  8. Hashimoto's disease: Thyroiditis is clinically quiescent. 9. Depression: This problem is much better. Unfortunately, the improvement in depression has not resulted in any effort on his part to  take charge of his T1DM, to find a job, or to pursue his education. We talked at length about how historical persons with whom he is familiar, such as [place], had taken actions to change their worlds and themselves. Because he enjoys history, I will continue to use historical persons to inspire him to take a more active role in his DM care and to move on with his life. 10. Noncompliance: I continue to hope that as he ages and matures, he will do a better job of taking care of his T1DM and his life.much better.  11. Weight loss, unintentional: His weight decreased 2 pounds since last visit, mostly due to under-insulinization.   12. Ear fullness: He has probably pushed some cerumen deeper into his ear canal. I suggested he purchase some ear wax removal solution and follow the directions until the symptoms resolve.   PLAN:  1. Diagnostic: Annual surveillance labs, testosterone, LH, Price prior to next visit. Call in 4 weeks to discuss BGs.  2. Therapeutic: Continue Synthroid and lisinopril at the current doses. Continue the current basal rates:  MN: 0.750 5 AM: 0.750 8 AM: 0.800 Noon: 0.950 6 PM: 1.500 9 PM: 1.200 3. Patient education: We talked about his BG control and about how all of the long-term complications of DM can develop. He knows what he should do and knows how to do it. Now he needs to commit to action. 4. Follow-up: 8 weeks 5. Homework assignment: Read a biography of [place].  Level of Service: This visit lasted in excess of 60 minutes. More than 50% of the visit was devoted to counseling.  Sherrlyn Hock, MD 08/26/2014 1:29 PM

## 2014-08-27 ENCOUNTER — Encounter: Payer: Self-pay | Admitting: "Endocrinology

## 2014-10-22 ENCOUNTER — Other Ambulatory Visit: Payer: Self-pay | Admitting: *Deleted

## 2014-10-22 DIAGNOSIS — IMO0002 Reserved for concepts with insufficient information to code with codable children: Secondary | ICD-10-CM

## 2014-10-22 DIAGNOSIS — E1065 Type 1 diabetes mellitus with hyperglycemia: Secondary | ICD-10-CM

## 2014-12-02 LAB — LIPID PANEL
Cholesterol: 155 mg/dL (ref 0–200)
HDL: 55 mg/dL (ref >39)
LDL CALC: 91 mg/dL (ref 0–99)
TRIGLYCERIDES: 46 mg/dL (ref <150)
Total CHOL/HDL Ratio: 2.8 Ratio
VLDL: 9 mg/dL (ref 0–40)

## 2014-12-02 LAB — COMPREHENSIVE METABOLIC PANEL
ALBUMIN: 4.8 g/dL (ref 3.5–5.2)
ALT: 12 U/L (ref 0–53)
AST: 13 U/L (ref 0–37)
Alkaline Phosphatase: 57 U/L (ref 39–117)
BILIRUBIN TOTAL: 1 mg/dL (ref 0.2–1.2)
BUN: 9 mg/dL (ref 6–23)
CALCIUM: 9.9 mg/dL (ref 8.4–10.5)
CO2: 29 mEq/L (ref 19–32)
Chloride: 100 mEq/L (ref 96–112)
Creat: 0.87 mg/dL (ref 0.50–1.35)
GLUCOSE: 65 mg/dL — AB (ref 70–99)
Potassium: 4.2 mEq/L (ref 3.5–5.3)
Sodium: 140 mEq/L (ref 135–145)
TOTAL PROTEIN: 7.7 g/dL (ref 6.0–8.3)

## 2014-12-02 LAB — HEMOGLOBIN A1C
Hgb A1c MFr Bld: 8.5 % — ABNORMAL HIGH (ref <5.7)
MEAN PLASMA GLUCOSE: 197 mg/dL — AB (ref <117)

## 2014-12-03 LAB — T4, FREE: FREE T4: 1.21 ng/dL (ref 0.80–1.80)

## 2014-12-03 LAB — MICROALBUMIN / CREATININE URINE RATIO
CREATININE, URINE: 482.9 mg/dL
MICROALB UR: 3.2 mg/dL — AB (ref <2.0)
MICROALB/CREAT RATIO: 6.6 mg/g (ref 0.0–30.0)

## 2014-12-03 LAB — FOLLICLE STIMULATING HORMONE: FSH: 8.2 m[IU]/mL (ref 1.4–18.1)

## 2014-12-03 LAB — T3, FREE: T3, Free: 3.6 pg/mL (ref 2.3–4.2)

## 2014-12-03 LAB — LUTEINIZING HORMONE: LH: 4 m[IU]/mL (ref 1.5–9.3)

## 2014-12-03 LAB — TSH: TSH: 2.011 u[IU]/mL (ref 0.350–4.500)

## 2014-12-08 LAB — TESTOSTERONE, FREE, TOTAL, SHBG
Sex Hormone Binding: 39 nmol/L (ref 10–50)
TESTOSTERONE-% FREE: 2 % (ref 1.6–2.9)
TESTOSTERONE: 621.97 ng/dL (ref 300–890)
Testosterone, Free: 122.3 pg/mL (ref 47.0–244.0)

## 2014-12-09 ENCOUNTER — Encounter: Payer: Self-pay | Admitting: "Endocrinology

## 2014-12-09 ENCOUNTER — Ambulatory Visit (INDEPENDENT_AMBULATORY_CARE_PROVIDER_SITE_OTHER): Payer: BLUE CROSS/BLUE SHIELD | Admitting: "Endocrinology

## 2014-12-09 VITALS — BP 115/77 | HR 124 | Wt 127.7 lb

## 2014-12-09 DIAGNOSIS — Z91199 Patient's noncompliance with other medical treatment and regimen due to unspecified reason: Secondary | ICD-10-CM

## 2014-12-09 DIAGNOSIS — F329 Major depressive disorder, single episode, unspecified: Secondary | ICD-10-CM

## 2014-12-09 DIAGNOSIS — IMO0002 Reserved for concepts with insufficient information to code with codable children: Secondary | ICD-10-CM

## 2014-12-09 DIAGNOSIS — R000 Tachycardia, unspecified: Secondary | ICD-10-CM

## 2014-12-09 DIAGNOSIS — E063 Autoimmune thyroiditis: Secondary | ICD-10-CM

## 2014-12-09 DIAGNOSIS — I471 Supraventricular tachycardia: Secondary | ICD-10-CM

## 2014-12-09 DIAGNOSIS — E10649 Type 1 diabetes mellitus with hypoglycemia without coma: Secondary | ICD-10-CM | POA: Diagnosis not present

## 2014-12-09 DIAGNOSIS — Z9119 Patient's noncompliance with other medical treatment and regimen: Secondary | ICD-10-CM

## 2014-12-09 DIAGNOSIS — E1042 Type 1 diabetes mellitus with diabetic polyneuropathy: Secondary | ICD-10-CM

## 2014-12-09 DIAGNOSIS — R634 Abnormal weight loss: Secondary | ICD-10-CM

## 2014-12-09 DIAGNOSIS — E1043 Type 1 diabetes mellitus with diabetic autonomic (poly)neuropathy: Secondary | ICD-10-CM | POA: Diagnosis not present

## 2014-12-09 DIAGNOSIS — E038 Other specified hypothyroidism: Secondary | ICD-10-CM

## 2014-12-09 DIAGNOSIS — E1065 Type 1 diabetes mellitus with hyperglycemia: Secondary | ICD-10-CM | POA: Diagnosis not present

## 2014-12-09 DIAGNOSIS — E049 Nontoxic goiter, unspecified: Secondary | ICD-10-CM

## 2014-12-09 DIAGNOSIS — I10 Essential (primary) hypertension: Secondary | ICD-10-CM

## 2014-12-09 DIAGNOSIS — G990 Autonomic neuropathy in diseases classified elsewhere: Secondary | ICD-10-CM

## 2014-12-09 DIAGNOSIS — F32A Depression, unspecified: Secondary | ICD-10-CM

## 2014-12-09 LAB — GLUCOSE, POCT (MANUAL RESULT ENTRY): POC Glucose: 393 mg/dl — AB (ref 70–99)

## 2014-12-09 NOTE — Progress Notes (Signed)
Subjective:  Patient Name: Scott Moran Date of Birth: Apr 20, 1991  MRN: [id number]  Scott Moran  presents to the office today for follow-up of his type 1 diabetes mellitus, goiter, hypoglycemia, depression, hypothyroidism, thyroiditis, diabetic autonomic neuropathy, inappropriate sinus tachycardia, non-proliferative diabetic retinopathy, diabetic peripheral neuropathy, fatigue, hypogonadotropic hypogonadism, and medical non-compliance.  HISTORY OF PRESENT ILLNESS:   Scott Moran is a 24 y.o. Caucasian young man. [place] was unaccompanied.   1. I first saw the patient in consultation on 03/27/05 in the [facility] [place] He had been referred by his primary care provider, Dr. Wendie Moran, in [place], [place], for evaluation and treatment of poorly controlled type 1 diabetes. The patient was 24 years old.  A. The patient had been diagnosed with type 1 diabetes in February of 1995 at age 24. He was on an insulin pump when I first met him. His hemoglobin A1c had increased from 7.1% in December of 2005 to 8.2% in April 2006. He was having frequent snacks that he was not taking boluses for. He was also frequently noncompliant with checking blood sugars and taking meal boluses. I adjusted his basal rates.  B. At his next [place] visit on 06/19/05, he was still very noncompliant. His mental status was normal. He had a 25 gram goiter. His father was serving in [place] with the [employer]. There was a lot of stress at home. I again adjusted his basal rates.  C. At the end of July 2006 we closed our satellite office in [place]. The patient elected to be followed at our clinic here in [place]. I have followed him here ever since.  2. During the past nine years, the patient has had several clinical issues.   A. T1DM: The patient's blood glucose control has usually been fairly good for a teenager. His maximum hemoglobin A1c occurred on 04/16/06 when the  hemoglobin A1c was 12.1%. Since then the hemoglobin A1c values have varied between 6.9% and 8.6%. He is still fairly noncompliant with checking blood sugars, bolusing, and changing his insulin pump sites on time, but he is more compliant and more careful than he used to be.   B. Goiter, Hashimoto's thyroiditis, and hypothyroidism: In late 2008 and early 2009 the patient developed hypothyroidism secondary to Hashimoto's disease. I started him on Synthroid, 25 mcg per day. When he takes his medication, he is euthyroid.  C. Autonomic neuropathy and inappropriate sinus tachycardia: In late 2008 he developed autonomic neuropathy which manifested as inappropriate sinus tachycardia. These problems varied in parallel with his BGs and HbA1c values.  D. Diabetic peripheral neuropathy: The patient has had symptoms and signs of mild diabetic peripheral neuropathy at several times in the past. His neuropathic symptoms similarly varied in parallel with his BGs and HbA1c values.   E. Non-proliferative diabetic retinopathy: This problem was identified during a diabetic eye exam in June 2010.  F. Hypogonadotropic hypogonadism: The patient has always been somewhat lackadaisical and relatively passive. On 06/29/10 his FSH was 4.9, LH was 2.4, testosterone was 399.66, and free testosterone was 79.8. The testosterone and free testosterone were relatively low for an 24 year old young man. At the same time the [place] and LH while "normal", appeared to be somewhat inappropriately low for the level of testosterone he had. We discussed the options for treatment including exercise and medication, to include [place]. The patient chose the exercise option. By 10/19/10 the [place] was 6.8, the LH was 3.2, total testosterone was 615.88 and the free  testosterone was 131.6. It may be that when he was more depressed he had less gonadotropic function. We will continue to follow up on this issue over time.  G. Depression/Personality disorders:    1).  The patient has been diagnosed with depression at different times in the past. For some time he took [place] ([place]), but discontinued this in early 2012. I had been trying to convince him for some time that he was depressed and that he should seek out help. He eventually did decide to seek psychiatric help.    2). On 06/08/13 [place] was evaluated by [place], a noted psychiatrist in [place].  [place] and his staff performed neuropsychiatric testing. On the [place] had problems with non-verbal reasoning and emotional intelligence. [place] diagnosed [place] with [place] and [place]. [place] prescribed [place], 10 mg/day. [place] was supposed to continue to see [place] in follow up.   3). [place] was then referred to a therapist, [place]. [place] enjoys working with [place] and feels that he is making progress.    H. 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   3. The patient's last [facility] visit was on 08/26/14. In the interim, he has been healthy. He says that he has not been checking his BGs as often as he should.   A. At his last visit we continued his basal rates. He is not having much hypoglycemia.    [place] saw [place], his therapist, several months ago. He plans to see [place] soon.    C. His sleep pattern since re-starting Synthroid is much better.   [place] now takes his Synthroid dose of 25 mcg/day pretty reliably. He is not taking lisinopril because he doesn't like to take pills. He is also not taking his [place].   4. Pertinent Review of Systems:  Constitutional: The patient feels "OK". Sometimes he is not depressed and sometimes he is more depressed.  He is pretty apathetic.    Eyes: Vision is good with his new glasses. There are no  significant eye complaints. Last eye exam was in November or December 2014. There were no signs of diabetic retinopathy. Ears: No problems.  Neck: The patient has no complaints of anterior neck swelling, soreness, tenderness,  pressure, discomfort, or difficulty swallowing.  Heart: Heart rate sometimes feels fast. Heart rate increases with exercise or other physical activity. The patient has no complaints of palpitations, irregular heat beats, chest pain, or chest pressure. Gastrointestinal: Bowel movents seem normal. The patient has no complaints of excessive hunger, acid reflux, upset stomach, stomach aches or pains, diarrhea, or constipation. Legs: Muscle mass and strength seem normal. There are no complaints of numbness, tingling, burning, or pain. No edema is noted. Feet: There are no obvious foot problems. There are no complaints of numbness, tingling, burning, or pain. No edema is noted. Hypoglycemia: He has not having many low BGs that he is aware of.  Emotional/psychological: The patient is doing worse.   6. BG printout:   He is changing his sites infrequently. He sometimes changes the reservoirs, but not the tubing or the sites. Sometimes he changes the tubing, but not the sites. Sometimes he changes all three.  He checks BGs 0-6 times per day. In the past month his average BG was 224, compared with 279 at  last visit. His BG range is 67 - > 400, compared with 68 - >400 at last visit.  He sometimes goes up to 36 hours without checking BGs, unchanged from last visit. He has been bolusing 0-8 times daily. The  longer he leaves sites in, and the longer he goes between BG checks and insulin boluses, the more variable his BGs are.   PAST MEDICAL, FAMILY, AND [place] HISTORY:  Past Medical History  Diagnosis Date  . Type 1 diabetes mellitus not at goal   . Hypoglycemia associated with diabetes   . Goiter   . Thyroiditis, autoimmune   . Hypothyroidism, acquired, autoimmune   . Tachycardia   .  Hypertension   . Hypogonadotropic hypogonadism   . Type 1 diabetes mellitus with diabetic autonomic neuropathy   . DM type 1 with diabetic peripheral neuropathy     Family History  Problem Relation Age of Onset  . Diabetes Maternal Uncle   . Hypertension Maternal Grandmother   . Thyroid disease Neg Hx   . Obesity Neg Hx   . Kidney disease Neg Hx   . Cancer Neg Hx   . Anemia Neg Hx     Current outpatient prescriptions: glucagon (GLUCAGON EMERGENCY) 1 MG injection, Inject 1 mg intramuscularly in thigh 1 time for severe hypoglycemia if unresponsive, unconscious, unable to swallow or has a seizure., Disp: 2 kit, Rfl: 3;  glucose blood (BAYER CONTOUR NEXT TEST) test strip, Use as instructed to check your blood sugar up to 6x daily. Please provide #90 day supply., Disp: 600 each, Rfl: 4 insulin aspart (NOVOLOG) 100 UNIT/ML injection, 300 units in insulin pump every 48 to 72 hours and per Protocols for Hyperglycemia & DKA Outpatient Treatment., Disp: 12 vial, Rfl: 4;  Insulin Pen Needle 31G X 5 MM MISC, BD Pen Needles- brand specific Inject insulin via insulin pen 7 x daily, Disp: 750 each, Rfl: 3;  levothyroxine (SYNTHROID, LEVOTHROID) 25 MCG tablet, Take one tablet daily., Disp: 90 tablet, Rfl: 3 [place] (LEXAPRO) 10 MG tablet, Take 1 tablet (10 mg total) by mouth daily. (Patient not taking: Reported on 12/09/2014), Disp: 90 tablet, Rfl: 3;  lisinopril (PRINIVIL,ZESTRIL) 10 MG tablet, Take one tablet daily. (Patient not taking: Reported on 12/09/2014), Disp: 90 tablet, Rfl: 3  Allergies as of 12/09/2014  . (No Known Allergies)    1. Work and Family: Patient is no longer attending junior college, in part due to family financial problems. He is still not working and is not interested in looking for work. 2. Activities: Few physical activities. He is a history buff and frequently participates in online chat room discussions. 3. Smoking, alcohol, or drugs: None 4. Primary Care Provider: He no  longer has a primary care provider.  REVIEW OF SYSTEMS: There are no other significant problems involving [place]'s other body systems.   Objective:  Vital Signs:  BP 115/77 mmHg  Pulse 124  Wt 127 lb 11.2 oz (57.924 kg)    Ht Readings from Last 3 Encounters:  08/22/13 _0  (1.702 m)   Wt Readings from Last 3 Encounters:  12/09/14 127 lb 11.2 oz (57.924 kg)  08/26/14 128 lb (58.06 kg)  03/16/14 130 lb (58.968 kg)   PHYSICAL EXAM:  Constitutional: [place] was bright and alert today. He knows what he should do to help himself, but he can't consistently seem to do what he should do. He is very chatty today. He appears healthy, but slim. His weight has decreased 1 pound since last visit.  Face: The face appears normal.  Eyes: There is no obvious arcus or proptosis. Moisture appears normal. Mouth: The oropharynx and tongue appear normal. Oral moisture is normal. Neck: The neck looks normal. No carotid bruits are noted. The thyroid gland is slightly smaller at 21-22 grams in size today.  Both lobes are smaller.The consistency of the thyroid gland is normal today.The thyroid gland is not tender to palpation. Lungs: The lungs are clear to auscultation. Air movement is good. Heart: Heart rate and rhythm are regular. Heart sounds S1 and S2 are normal. I did not appreciate any pathologic cardiac murmurs. Abdomen: The abdomen is normal in size. Bowel sounds are normal. There is no obvious hepatomegaly, splenomegaly, or other mass effect.  Arms: Muscle size and bulk are normal for age. Hands: There is no obvious tremor. Phalangeal and metacarpophalangeal joints are normal. Palmar muscles are normal. Palmar skin is normal. Palmar moisture is also normal. Legs: Muscles appear normal for age. No edema is present. Feet: Feet are normally formed. Dorsalis pedal pulses are 1+ on the right and faint 1+ on the left.  Neurologic: Strength is normal for age in both the upper and lower extremities. Muscle  tone is normal. Sensation to touch is normal in both legs, but decreased in the right heel.Marland Kitchen     LAB DATA:   Results for orders placed or performed in visit on 12/09/14 (from the past 504 hour(s))  POCT Glucose (CBG)   Collection Time: 12/09/14  1:22 PM  Result Value Ref Range   POC Glucose 393 (A) 70 - 99 mg/dl  Results for orders placed or performed in visit on 10/22/14 (from the past 504 hour(s))  Luteinizing hormone   Collection Time: 12/02/14 12:26 PM  Result Value Ref Range   LH 4.0 1.5 - 9.3 mIU/mL  Hemoglobin A1c   Collection Time: 12/02/14 12:26 PM  Result Value Ref Range   Hgb A1c MFr Bld 8.5 (H) <5.7 %   Mean Plasma Glucose 197 (H) <117 mg/dL  Comprehensive metabolic panel   Collection Time: 12/02/14 12:26 PM  Result Value Ref Range   Sodium 140 135 - 145 mEq/L   Potassium 4.2 3.5 - 5.3 mEq/L   Chloride 100 96 - 112 mEq/L   CO2 29 19 - 32 mEq/L   Glucose, Bld 65 (L) 70 - 99 mg/dL   BUN 9 6 - 23 mg/dL   Creat 0.87 0.50 - 1.35 mg/dL   Total Bilirubin 1.0 0.2 - 1.2 mg/dL   Alkaline Phosphatase 57 39 - 117 U/L   AST 13 0 - 37 U/L   ALT 12 0 - 53 U/L   Total Protein 7.7 6.0 - 8.3 g/dL   Albumin 4.8 3.5 - 5.2 g/dL   Calcium 9.9 8.4 - 10.5 mg/dL  Lipid panel   Collection Time: 12/02/14 12:26 PM  Result Value Ref Range   Cholesterol 155 0 - 200 mg/dL   Triglycerides 46 <150 mg/dL   HDL 55 >39 mg/dL   Total CHOL/HDL Ratio 2.8 Ratio   VLDL 9 0 - 40 mg/dL   LDL Cholesterol 91 0 - 99 mg/dL  Microalbumin / creatinine urine ratio   Collection Time: 12/02/14 12:26 PM  Result Value Ref Range   Microalb, Ur 3.2 (H) <2.0 mg/dL   Creatinine, Urine 482.9 mg/dL   Microalb Creat Ratio 6.6 0.0 - 30.0 mg/g  TSH   Collection Time: 12/02/14 12:26 PM  Result Value Ref Range   TSH 2.011 0.350 - 4.500 uIU/mL  T4, free   Collection Time: 12/02/14 12:26 PM  Result Value Ref Range   Free T4 1.21 0.80 - 1.80 ng/dL  T3, free   Collection Time: 12/02/14 12:26 PM  Result Value Ref  Range   T3, Free 3.6 2.3 - 4.2 pg/mL  Testosterone, free, total   Collection Time: 12/02/14 12:26 PM  Result Value Ref Range   Testosterone 621.97 300 - 890 ng/dL   Sex Hormone Binding 39 10 - 50 nmol/L   Testosterone, Free 122.3 47.0 - 244.0 pg/mL   Testosterone-% Free 2.0 1.6 - 2.9 %  Follicle stimulating hormone   Collection Time: 12/02/14 12:26 PM  Result Value Ref Range   FSH 8.2 1.4 - 18.1 mIU/mL   Labs 12/02/14: HbA1c 8.5%;  LH 4.0, FSH 8.2, testosterone 621.97; CMP normal; TSH 2.01, free T4 1.21, free T3 3.6; cholesterol 155, triglycerides 46, HDL  55, LDL 91; microalbumin/creatinine ratio 6.6  Labs 03/30/14: TSH 1.935, free T4 1.12, free T3 3.2; CMP: normal except for glucose 203; testosterone 624, FSH 6.3, LH 2.4; urine microalbumin/creatinine ratio 6.4; cholesterol 163, triglycerides 55, HDL 64, LDL 80  Labs 08/23/13: TSH 1.139; CMP normal except for a glucose of 145; CBC: WBC 5.4, Hgb 12.7, Hct 34.7%   Assessment and Plan:   ASSESSMENT:  1-2. Type 1 diabetes mellitus/hypoglycemia: [place] is doing a bit better in terms of HbA1c. He is checking BGs more frequently and consistently. This month he only went one day without checking BGs. He is also bolusing more consistently. He has had three documented BGs > 400. He has had 3 BGs < 80. When he checks BGS, boluses, and changes sites more frequently, his BGs can be pretty good.  3. Autonomic neuropathy and inappropriate sinus tachycardia: These problems are still bad. Fortunately, these problems will reverse with better BG control.  4. Peripheral neuropathy: This problem is a bit better today. This problems is also equally reversible with improved BG control .   5. Hypothyroid: He is euthyroid now on his current dose of Synthroid.   6. Hypogonadotropic hypogonadism: His testosterone level is essentially where it was in 2011. I had hoped that it would be higher when his depression improved, but he unfortunately stopped taking his  [place] and is more depressed.  7. Hypertension: His BP is good. Exercising for 30-45 minutes per day will help a lot. He needs to resume his lisinopril to protect his kidneys from high BG and high BP.  8. Hashimoto's disease: Thyroiditis is clinically quiescent. 9. Depression: This problem is worse since stopping [place].about the same. Because he enjoys history, I will continue to use historical persons to inspire him to take a more active role in his DM care and to move on with his life. 10. Noncompliance: I continue to hope that as he ages and matures, he will do a better job of taking care of his T1DM and his life.  11. Weight loss, unintentional: His weight decreased another pound.     PLAN:  1. Diagnostic: Follow the DM care plan. Call in 4 weeks to discuss BGs.  2. Therapeutic: Continue Synthroid. Resume lisinopril and [place]. Continue the current basal rates:  MN: 0.750 5 AM: 0.750 8 AM: 0.800 Noon: 0.950 6 PM: 1.500 9 PM: 1.200 3. Patient education: He knows what he should do and knows how to do it. Now he needs to commit to action. If he checks BGs at least twice daily and takes his insulins more reliably, I will sign  his DMV application. 4. Follow-up: 3 months 5. Homework assignment: Read a biography of [place].  Level of Service: This visit lasted in excess of 60 minutes. More than 50% of the visit was devoted to counseling.  Sherrlyn Hock, MD 12/09/2014 1:45 PM

## 2014-12-09 NOTE — Patient Instructions (Signed)
Follow up visit in 3 months. 

## 2015-02-21 ENCOUNTER — Other Ambulatory Visit: Payer: Self-pay | Admitting: *Deleted

## 2015-02-21 ENCOUNTER — Telehealth: Payer: Self-pay | Admitting: "Endocrinology

## 2015-02-21 DIAGNOSIS — E063 Autoimmune thyroiditis: Secondary | ICD-10-CM

## 2015-02-21 MED ORDER — LEVOTHYROXINE SODIUM 25 MCG PO TABS: ORAL_TABLET | ORAL | Status: DC

## 2015-02-21 NOTE — Telephone Encounter (Signed)
Sent via escribe

## 2015-03-02 ENCOUNTER — Telehealth: Payer: Self-pay | Admitting: *Deleted

## 2015-03-02 NOTE — Telephone Encounter (Signed)
Spoke to mom moved appt from 4/13 to

## 2015-03-16 ENCOUNTER — Ambulatory Visit: Payer: BLUE CROSS/BLUE SHIELD | Admitting: "Endocrinology

## 2015-03-25 ENCOUNTER — Ambulatory Visit (INDEPENDENT_AMBULATORY_CARE_PROVIDER_SITE_OTHER): Payer: BLUE CROSS/BLUE SHIELD | Admitting: "Endocrinology

## 2015-03-25 ENCOUNTER — Encounter: Payer: Self-pay | Admitting: "Endocrinology

## 2015-03-25 VITALS — BP 117/82 | HR 116 | Wt 131.8 lb

## 2015-03-25 DIAGNOSIS — IMO0002 Reserved for concepts with insufficient information to code with codable children: Secondary | ICD-10-CM

## 2015-03-25 DIAGNOSIS — E10649 Type 1 diabetes mellitus with hypoglycemia without coma: Secondary | ICD-10-CM

## 2015-03-25 DIAGNOSIS — R000 Tachycardia, unspecified: Secondary | ICD-10-CM

## 2015-03-25 DIAGNOSIS — I471 Supraventricular tachycardia: Secondary | ICD-10-CM

## 2015-03-25 DIAGNOSIS — R634 Abnormal weight loss: Secondary | ICD-10-CM

## 2015-03-25 DIAGNOSIS — F329 Major depressive disorder, single episode, unspecified: Secondary | ICD-10-CM

## 2015-03-25 DIAGNOSIS — E1042 Type 1 diabetes mellitus with diabetic polyneuropathy: Secondary | ICD-10-CM

## 2015-03-25 DIAGNOSIS — I4711 Inappropriate sinus tachycardia, so stated: Secondary | ICD-10-CM

## 2015-03-25 DIAGNOSIS — E1065 Type 1 diabetes mellitus with hyperglycemia: Secondary | ICD-10-CM | POA: Diagnosis not present

## 2015-03-25 DIAGNOSIS — I10 Essential (primary) hypertension: Secondary | ICD-10-CM

## 2015-03-25 DIAGNOSIS — E063 Autoimmune thyroiditis: Secondary | ICD-10-CM

## 2015-03-25 DIAGNOSIS — E1043 Type 1 diabetes mellitus with diabetic autonomic (poly)neuropathy: Secondary | ICD-10-CM

## 2015-03-25 DIAGNOSIS — F32A Depression, unspecified: Secondary | ICD-10-CM

## 2015-03-25 DIAGNOSIS — G990 Autonomic neuropathy in diseases classified elsewhere: Secondary | ICD-10-CM

## 2015-03-25 DIAGNOSIS — E038 Other specified hypothyroidism: Secondary | ICD-10-CM

## 2015-03-25 DIAGNOSIS — E049 Nontoxic goiter, unspecified: Secondary | ICD-10-CM

## 2015-03-25 LAB — POCT GLYCOSYLATED HEMOGLOBIN (HGB A1C): HEMOGLOBIN A1C: 7.8

## 2015-03-25 LAB — GLUCOSE, POCT (MANUAL RESULT ENTRY): POC GLUCOSE: 196 mg/dL — AB (ref 70–99)

## 2015-03-25 NOTE — Patient Instructions (Signed)
Follow up visit in 3 months. 

## 2015-03-25 NOTE — Progress Notes (Signed)
Subjective:  Patient Name: Scott Moran Date of Birth: 04-28-1991  MRN: [id number]  Scott Moran  presents to the office today for follow-up of his type 1 diabetes mellitus, goiter, hypoglycemia, depression, social anxiety disorder, avoidant personality disorder, hypothyroidism, thyroiditis, diabetic autonomic neuropathy, inappropriate sinus tachycardia, non-proliferative diabetic retinopathy, diabetic peripheral neuropathy, fatigue, hypogonadotropic hypogonadism, and medical non-compliance.  HISTORY OF PRESENT ILLNESS:   Scott Moran is a 24 y.o. Caucasian young man. Scott Moran was unaccompanied.   1. I first saw the patient in consultation on 03/27/05 in the [facility] in [place], [place]. He had been referred by his primary care provider, [name], in [place], [place], for evaluation and treatment of poorly controlled type 1 diabetes. The patient was 24 years old.  A. The patient had been diagnosed with type 1 diabetes in February of 1995 at age 62. He was on an insulin pump when I first met him. His hemoglobin A1c had increased from 7.1% in December of 2005 to 8.2% in April 2006. He was having frequent snacks that he was not taking boluses for. He was also frequently noncompliant with checking blood sugars and taking meal boluses. I adjusted his basal rates.  B. At his next [place] visit on 06/19/05, he was still very noncompliant. His mental status was normal. He had a 25 gram goiter. His father was serving in [place] with the [employer]. There was a lot of stress at home. I again adjusted his basal rates.  C. At the end of July 2006 we closed our satellite office in [place]. The patient elected to be followed at our clinic here in [place]. I have followed him here ever since.  2. During the past ten years, the patient has had several clinical issues.   A. T1DM: The patient's blood glucose control has usually been fairly good for a teenager.  His maximum hemoglobin A1c occurred on 04/16/06 when the hemoglobin A1c was 12.1%. Since then the hemoglobin A1c values have varied between 6.9% and 8.6%. He is still fairly noncompliant with checking blood sugars, bolusing, and changing his insulin pump sites on time, but he is more compliant and more careful than he used to be. He has not required hospital re-admission for DKA, severe hyperglycemia, or hypoglycemia.   B. Goiter, Hashimoto's thyroiditis, and hypothyroidism: In late 2008 and early 2009 the patient developed hypothyroidism secondary to Hashimoto's disease. I started him on Synthroid, 25 mcg per day. When he takes his medication, he is euthyroid.  C. Autonomic neuropathy and inappropriate sinus tachycardia: In late 2008 he developed autonomic neuropathy which manifested as inappropriate sinus tachycardia. These problems varied in parallel with his BGs and HbA1c values.  D. Diabetic peripheral neuropathy: The patient has had symptoms and signs of mild diabetic peripheral neuropathy at several times in the past. His peripheral neuropathic signs and symptoms similarly varied in parallel with his BGs and HbA1c values.   E. Non-proliferative diabetic retinopathy: This problem was identified during a diabetic eye exam in June 2010.  F. Hypogonadotropic hypogonadism: The patient has always been somewhat lackadaisical and relatively passive. On 06/29/10 his FSH was 4.9, LH was 2.4, testosterone was 399.66, and free testosterone was 79.8. The testosterone and free testosterone were relatively low for an 24 year old young man. At the same time the [facility] and LH while "normal", appeared to be somewhat inappropriately low for the level of testosterone he had. We discussed the options for treatment including exercise and medication, to include AndroGel. The patient  chose the exercise option. By 10/19/10 the [facility] was 6.8, the LH was 3.2, total testosterone was 615.88 and the free testosterone was 131.6. It may be  that when he was more depressed he had less gonadotropic function. We will continue to follow up on this issue over time.  G. Depression/Personality disorders:    1). The patient has been diagnosed with depression at different times in the past. For some time he took Remeron (mirtazapine), but discontinued this in early 2012. I had been trying to convince him for some time that he was depressed and that he should seek out help. He eventually did decide to seek psychiatric help.    2). On 06/08/13 Scott Moran was evaluated by [name], a noted psychiatrist in [place].  [name] and his staff performed neuropsychiatric testing. On the [place]ER[facility] POOLE had problems with non-verbal reasoning and emotional intelligence. 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 [name] prescribed escitalopram, 10 mg/day. Scott Moran was supposed to continue to see [name] in follow up.   3). Scott Moran was then referred to a therapist, [name]. Scott Moran enjoys working with Dr. [name] and feels that he is making progress.    H. 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   3. The patient's last [facility] visit was on 12/09/14. In the interim, he has been healthy. He says that he is still not checking his BGs and taking his insulin injections as often as he should. He remains very frustrated that he can't make himself do better. When he tries to do better he does so for a few days, then slides backward.   A. At his last visit we continued his basal rates. He is having some BGs in the 60s in the mornings this week.     Scott Moran saw [name], his therapist, three days ago.   C. His sleep pattern since re-starting Synthroid is much better.   Scott Moran takes his Synthroid dose of 25 mcg/day every night. He is not taking  lisinopril and his escitalopram very often.   E. For the past several visits I have been attempting to utilize his interest in history to give him examples of historical personalities who have overcome adversity. At last visit I asked him to look into [place], the commander of the [employer] in [place]. Scott Moran did read extensively about General Pershing. Unfortunately he did not grasp the lesson that to make changes in one's life, one has to step outside one's [place] zone and take on new challenges.    4. Pertinent Review of Systems:  Constitutional: The patient feels "pretty OK". Sometimes he is not depressed and sometimes he is more depressed.  Some days he is anxious and sometimes he is not anxious. He rarely leaves his house. He spends hour and hour on the web.  Eyes: Vision is good with his new glasses. There are no significant eye complaints. Last eye exam was in November or December 2014. There were no signs of diabetic retinopathy. Mouth: No problems.  Neck: The patient has no complaints of anterior neck swelling, soreness, tenderness,  pressure, discomfort, or difficulty swallowing.  Heart: Heart rate sometimes feels fast. Heart rate increases with exercise or other physical activity. The patient has no complaints of palpitations, irregular heat beats, chest pain, or  chest pressure. Gastrointestinal: Bowel movents seem normal. The patient has no complaints of excessive hunger, acid reflux, upset stomach, stomach aches or pains, diarrhea, or constipation. Legs: Muscle mass and strength seem normal. There are no complaints of numbness, tingling, burning, or pain. No edema is noted. Feet: There are no obvious foot problems. There are no complaints of numbness, tingling, burning, or pain. No edema is noted. Hypoglycemia: He has been having more low BGs recently.   Emotional/psychological: The patient is essentially unchanged. His disinclination to socialize with  others is unchanged. His inability to make himself take care of his DM self-care tasks on a regular and reliable basis is also unchanged.  6. BG printout: He is changing his sites about every 5-7 days. He sometimes changes the reservoirs, but not the tubing or the sites. Sometimes he changes the tubing, but not the sites. Sometimes he changes all three.  He checks BGs 0-6 times per day. He boluses 0-6 times per day. In the past month his average BG was 237, compared with 224 at last visit and with 279 at the visit prior. His BG range is 48 - > 400, compared with 67 - >400 at last visit and with 68 - >400 at the visit prior.  He sometimes goes up to 36 hours without checking BGs, unchanged from last visit. He sometimes goes up to 32 hours without bolusing. All of his boluses have been food boluses or correction boluses. He has not been doing any combination correction boluses and food boluses. The  longer he leaves sites in, and the longer he goes between BG checks and insulin boluses, the higher his BGs are. Sometimes when he has low BGs he over-treats them, resulting in BGs in the mid-to-upper 300s. He has had three documented BGs > 400, similar to his last visit. He has had nine BGs < 80, compared with only three at his last visit.   PAST MEDICAL, FAMILY, AND SOCIAL HISTORY:  Past Medical History  Diagnosis Date  . Type 1 diabetes mellitus not at goal   . Hypoglycemia associated with diabetes   . Goiter   . Thyroiditis, autoimmune   . Hypothyroidism, acquired, autoimmune   . Tachycardia   . Hypertension   . Hypogonadotropic hypogonadism   . Type 1 diabetes mellitus with diabetic autonomic neuropathy   . DM type 1 with diabetic peripheral neuropathy     Family History  Problem Relation Age of Onset  . Diabetes Maternal Uncle   . Hypertension Maternal Grandmother   . Thyroid disease Neg Hx   . Obesity Neg Hx   . Kidney disease Neg Hx   . Cancer Neg Hx   . Anemia Neg Hx      Current  outpatient prescriptions:  .  escitalopram (LEXAPRO) 10 MG tablet, Take 1 tablet (10 mg total) by mouth daily., Disp: 90 tablet, Rfl: 3 .  glucose blood (BAYER CONTOUR NEXT TEST) test strip, Use as instructed to check your blood sugar up to 6x daily. Please provide #90 day supply., Disp: 600 each, Rfl: 4 .  insulin aspart (NOVOLOG) 100 UNIT/ML injection, 300 units in insulin pump every 48 to 72 hours and per Protocols for Hyperglycemia & DKA Outpatient Treatment., Disp: 12 vial, Rfl: 4 .  levothyroxine (SYNTHROID, LEVOTHROID) 25 MCG tablet, Take one tablet daily., Disp: 90 tablet, Rfl: 3  Allergies as of 03/25/2015  . (No Known Allergies)    1. Work and Family: Patient is no longer attending junior college,  in part due to family financial problems. He is still not working and is not interested in looking for work. 2. Activities: Few physical activities. He is a history buff and frequently participates in online chat room discussions. 3. Smoking, alcohol, or drugs: None 4. Primary Care Provider: He no longer has a primary care provider.  REVIEW OF SYSTEMS: There are no other significant problems involving Scott Moran's other body systems.   Objective:  Vital Signs:  BP 117/82 mmHg  Pulse 116  Wt 131 lb 12.8 oz (59.784 kg)    Ht Readings from Last 3 Encounters:  No data found for Ht   Wt Readings from Last 3 Encounters:  03/25/15 131 lb 12.8 oz (59.784 kg)  12/09/14 127 lb 11.2 oz (57.924 kg)  08/26/14 128 lb (58.06 kg)   PHYSICAL EXAM:  Constitutional: Scott Moran was bright and alert today. He knows what he should do to help himself, but he can't consistently seem to do what he should do. He is very chatty today. He appears healthy, but slim. His weight has increased 4 pounds since last visit. His affect is very normal. He is very intelligent, and very insightful about his flaws. Unfortunately, he is not really motivated to make changes in his own behaviors.   Face: The face appears  normal.  Eyes: There is no obvious arcus or proptosis. Moisture appears normal. Mouth: The oropharynx and tongue appear normal. Oral moisture is normal. Neck: The neck looks normal. No carotid bruits are noted. The thyroid gland is  smaller at 20+ grams in size today.  The right lobe is normal in size. The left lobe is only slightly enlarged. The consistency of the thyroid gland is normal today.The thyroid gland is not tender to palpation. Lungs: The lungs are clear to auscultation. Air movement is good. Heart: Heart rate remains tachycardic. Heart rhythm is regular. Heart sounds S1 and S2 are normal. I did not appreciate any pathologic cardiac murmurs. Abdomen: The abdomen is normal in size. Bowel sounds are normal. There is no obvious hepatomegaly, splenomegaly, or other mass effect.  Arms: Muscle size and bulk are normal for age. Hands: There is no obvious tremor. Phalangeal and metacarpophalangeal joints are normal. Palmar muscles are normal. Palmar skin is normal. Palmar moisture is also normal. Legs: Muscles appear normal for age. No edema is present. Feet: Feet are normally formed. Dorsalis pedal pulses are 1+ on the left and faint 1+ on the right.  Neurologic: Strength is normal for age in both the upper and lower extremities. Muscle tone is normal. Sensation to touch is normal in both legs, but decreased in both heels.     LAB DATA:   Results for orders placed or performed in visit on 03/25/15 (from the past 504 hour(s))  POCT Glucose (CBG)   Collection Time: 03/25/15 11:08 AM  Result Value Ref Range   POC Glucose 196 (A) 70 - 99 mg/dl  POCT HgB A1C   Collection Time: 03/25/15 11:13 AM  Result Value Ref Range   Hemoglobin A1C 7.8    Labs 03/25/15: HbA1c 7.8%  Labs 12/02/14: HbA1c 8.5%;  LH 4.0, FSH 8.2, testosterone 621.97; CMP normal; TSH 2.01, free T4 1.21, free T3 3.6; cholesterol 155, triglycerides 46, HDL  55, LDL 91; microalbumin/creatinine ratio 6.6  Labs 03/30/14: TSH  1.935, free T4 1.12, free T3 3.2; CMP: normal except for glucose 203; testosterone 624, FSH 6.3, LH 2.4; urine microalbumin/creatinine ratio 6.4; cholesterol 163, triglycerides 55, HDL 64, LDL 80  Labs  08/23/13: TSH 1.139; CMP normal except for a glucose of 145; CBC: WBC 5.4, Hgb 12.7, Hct 34.7%   Assessment and Plan:   ASSESSMENT:  1-2. Type 1 diabetes mellitus/hypoglycemia: Scott Moran is doing a bit better in terms of HbA1c, but he is also having more lower BGs. He is checking BGs somewhat more frequently and consistently. This month he only went one day without checking BGs. He is also bolusing more consistently. He has had three documented BGs > 400. He has had nine BGs < 80. When he checks BGS, boluses, and changes sites more frequently, his BGs can be pretty good. Some of his lowest BGs occurred when he had not checked his BGs for more than 6 hours, but had taken food boluses. 3. Autonomic neuropathy and inappropriate sinus tachycardia: These problems are still severe. Fortunately, these problems will reverse with better BG control.  4. Peripheral neuropathy: This problem is a bit worse today. This problems is also equally reversible with improved BG control .   5. Hypothyroid: He was euthyroid in December on his current dose of Synthroid.   6. Hypogonadotropic hypogonadism: His testosterone level in December was essentially where it was in 2011. I had hoped that it would be higher when his depression improved, but he unfortunately stopped taking his escitalopram.  7. Hypertension: His diastolic BP is still elevated. Exercising for 30-45 minutes per day will help a lot. He needs to resume his lisinopril to protect his kidneys from high BG and high BP.  8. Hashimoto's disease: Thyroiditis is clinically quiescent. 9. Depression: This problem is about the same. He is not clinically depressed today, but also can't make himself what he needs to do. Because he enjoys history, I will continue to use  historical persons to inspire him to take a more active role in his DM care and to move on with his life. 10. Noncompliance: He is doing somewhat better. I continue to hope that as he ages and matures, he will do a better job of taking care of his T1DM and his life.  11. Weight loss, unintentional: This problem has resolved. He is obviously taking more insulin now.     PLAN:  1. Diagnostic: Follow the DM care plan. Call in 4 weeks to discuss BGs.  2. Therapeutic: Continue Synthroid. Resume lisinopril and escitalopram. Continue the current basal rates:  MN: 0.750 5 AM: 0.750 8 AM: 0.800 Noon: 0.950 6 PM: 1.500 9 PM: 1.200 3. Patient education: He knows what he should do and knows how to do it. Now he needs to commit to action. If he checks BGs at least twice daily and takes his insulins more reliably, I will sign his DMV application. 4. Follow-up: 3 months 5. Homework assignment: Thing about what made Pershing able to recover from the tragedies in his life. .  Level of Service: This visit lasted in excess of 80 minutes. More than 50% of the visit was devoted to counseling.  Sherrlyn Hock, MD 03/25/2015 12:08 PM

## 2015-04-02 ENCOUNTER — Telehealth: Payer: Self-pay | Admitting: "Endocrinology

## 2015-04-02 NOTE — Telephone Encounter (Signed)
1. At 7:30 AM Scott NeedleMichael had me paged. He had developed nausea and vomiting during the night. His morning BG was 115. He had 40 ketones in his urine. He had forgotten how to manage sick days. He had changed a bad insertion site last night. He wondered if he had a stomach flu. 2. Assessment: It appeared that his new site was working and that he had some type of gastroenteritis, probably viral. His ketonuria indicated that he needed both more insulin and more glucose in order to transport enough glucose into cells so that the cells would stop excess fatty acid oxidation and ketone formation. He also needed enough fluids to mobilize ketones and transport them to the kidneys for excretion.  3. Plan: I reviewed the Sick Day and DKA protocols with him. I asked him to drink 8 oz. of sugar-containing liquids every 30 minutes and to give himself insulin boluses every hour. If he were able to keep the fluids down he would be able to manage things at home without needing to go to the ED.   4. Follow up: I called him at 10 AM. He was feeling better and had had no further nausea or vomiting. He had been able to keep fluids down without problems. His BG had increased to 218. He had not yet taken any insulin boluses. I asked him to proceed with the insulin boluses every hour and to continue with the 8 oz. of sugar-containing fluids every 30 minutes until the ketones cleared. He said that he would do so. Scott Moran,Scott Moran

## 2015-04-21 ENCOUNTER — Other Ambulatory Visit: Payer: Self-pay | Admitting: "Endocrinology

## 2015-05-30 ENCOUNTER — Other Ambulatory Visit: Payer: Self-pay

## 2015-07-13 IMAGING — CR DG CHEST 1V PORT
1 series · 1 of 1 positions shown · non-contrast
Comparison: August 20, 2010

CLINICAL DATA: Tachypnea

EXAM:
PORTABLE CHEST - 1 VIEW

[AP]
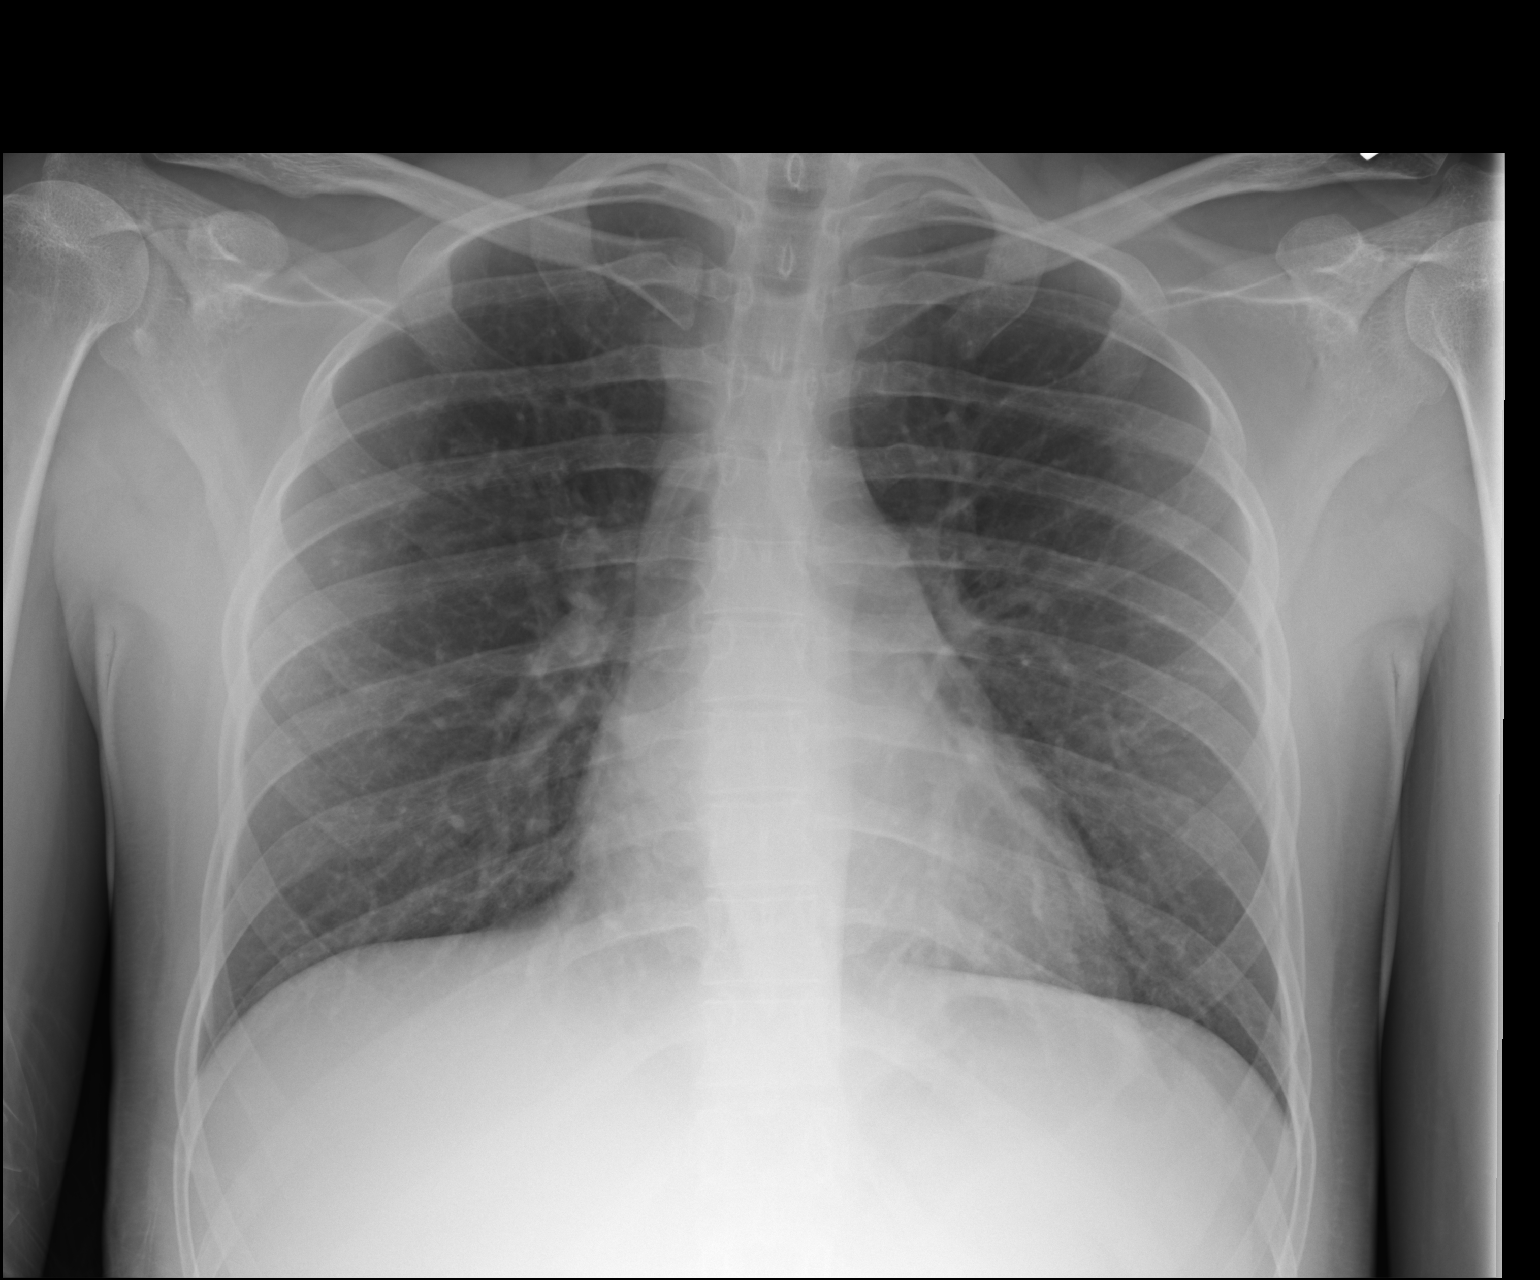

[1 of 1 positions shown; findings below may reference images not displayed]

FINDINGS: The lungs are mildly hyperexpanded but clear. Heart size and
pulmonary vascularity are normal. No adenopathy. No bone lesions. No
pneumothorax.
IMPRESSION: No edema or consolidation.

## 2015-07-20 ENCOUNTER — Encounter: Payer: Self-pay | Admitting: "Endocrinology

## 2015-07-20 ENCOUNTER — Ambulatory Visit (INDEPENDENT_AMBULATORY_CARE_PROVIDER_SITE_OTHER): Payer: BLUE CROSS/BLUE SHIELD | Admitting: "Endocrinology

## 2015-07-20 VITALS — BP 97/54 | HR 112 | Wt 126.6 lb

## 2015-07-20 DIAGNOSIS — IMO0002 Reserved for concepts with insufficient information to code with codable children: Secondary | ICD-10-CM

## 2015-07-20 DIAGNOSIS — R634 Abnormal weight loss: Secondary | ICD-10-CM

## 2015-07-20 DIAGNOSIS — I10 Essential (primary) hypertension: Secondary | ICD-10-CM

## 2015-07-20 DIAGNOSIS — R000 Tachycardia, unspecified: Secondary | ICD-10-CM

## 2015-07-20 DIAGNOSIS — E10649 Type 1 diabetes mellitus with hypoglycemia without coma: Secondary | ICD-10-CM

## 2015-07-20 DIAGNOSIS — F32A Depression, unspecified: Secondary | ICD-10-CM

## 2015-07-20 DIAGNOSIS — Z91199 Patient's noncompliance with other medical treatment and regimen due to unspecified reason: Secondary | ICD-10-CM

## 2015-07-20 DIAGNOSIS — E1042 Type 1 diabetes mellitus with diabetic polyneuropathy: Secondary | ICD-10-CM

## 2015-07-20 DIAGNOSIS — E038 Other specified hypothyroidism: Secondary | ICD-10-CM

## 2015-07-20 DIAGNOSIS — E1043 Type 1 diabetes mellitus with diabetic autonomic (poly)neuropathy: Secondary | ICD-10-CM | POA: Diagnosis not present

## 2015-07-20 DIAGNOSIS — E1065 Type 1 diabetes mellitus with hyperglycemia: Secondary | ICD-10-CM

## 2015-07-20 DIAGNOSIS — I471 Supraventricular tachycardia: Secondary | ICD-10-CM

## 2015-07-20 DIAGNOSIS — E063 Autoimmune thyroiditis: Secondary | ICD-10-CM

## 2015-07-20 DIAGNOSIS — E049 Nontoxic goiter, unspecified: Secondary | ICD-10-CM

## 2015-07-20 DIAGNOSIS — F329 Major depressive disorder, single episode, unspecified: Secondary | ICD-10-CM

## 2015-07-20 DIAGNOSIS — I4711 Inappropriate sinus tachycardia, so stated: Secondary | ICD-10-CM

## 2015-07-20 DIAGNOSIS — Z9119 Patient's noncompliance with other medical treatment and regimen: Secondary | ICD-10-CM

## 2015-07-20 DIAGNOSIS — G990 Autonomic neuropathy in diseases classified elsewhere: Secondary | ICD-10-CM

## 2015-07-20 LAB — POCT GLYCOSYLATED HEMOGLOBIN (HGB A1C): Hemoglobin A1C: 8.2

## 2015-07-20 LAB — POCT GLUCOSE (DEVICE FOR HOME USE): POC GLUCOSE: 170 mg/dL — AB (ref 70–99)

## 2015-07-20 NOTE — Progress Notes (Signed)
Subjective:  Patient Name: Scott Moran Date of Birth: 04-28-1991  MRN: [id number]  Scott Moran  presents to the office today for follow-up of his type 1 diabetes mellitus, goiter, hypoglycemia, depression, social anxiety disorder, avoidant personality disorder, hypothyroidism, thyroiditis, diabetic autonomic neuropathy, inappropriate sinus tachycardia, non-proliferative diabetic retinopathy, diabetic peripheral neuropathy, fatigue, hypogonadotropic hypogonadism, and medical non-compliance.  HISTORY OF PRESENT ILLNESS:   Scott Moran is a 24 y.o. Caucasian young man. Scott Moran was unaccompanied.   1. I first saw the patient in consultation on 03/27/05 in the [facility] in [place], [place]. He had been referred by his primary care provider, [name], in [place], [place], for evaluation and treatment of poorly controlled type 1 diabetes. The patient was 24 years old.  A. The patient had been diagnosed with type 1 diabetes in February of 1995 at age 62. He was on an insulin pump when I first met him. His hemoglobin A1c had increased from 7.1% in December of 2005 to 8.2% in April 2006. He was having frequent snacks that he was not taking boluses for. He was also frequently noncompliant with checking blood sugars and taking meal boluses. I adjusted his basal rates.  B. At his next [place] visit on 06/19/05, he was still very noncompliant. His mental status was normal. He had a 25 gram goiter. His father was serving in [place] with the [employer]. There was a lot of stress at home. I again adjusted his basal rates.  C. At the end of July 2006 we closed our satellite office in [place]. The patient elected to be followed at our clinic here in [place]. I have followed him here ever since.  2. During the past ten years, the patient has had several clinical issues.   A. T1DM: The patient's blood glucose control has usually been fairly good for a teenager.  His maximum hemoglobin A1c occurred on 04/16/06 when the hemoglobin A1c was 12.1%. Since then the hemoglobin A1c values have varied between 6.9% and 8.6%. He is still fairly noncompliant with checking blood sugars, bolusing, and changing his insulin pump sites on time, but he is more compliant and more careful than he used to be. He has not required hospital re-admission for DKA, severe hyperglycemia, or hypoglycemia.   B. Goiter, Hashimoto's thyroiditis, and hypothyroidism: In late 2008 and early 2009 the patient developed hypothyroidism secondary to Hashimoto's disease. I started him on Synthroid, 25 mcg per day. When he takes his medication, he is euthyroid.  C. Autonomic neuropathy and inappropriate sinus tachycardia: In late 2008 he developed autonomic neuropathy which manifested as inappropriate sinus tachycardia. These problems varied in parallel with his BGs and HbA1c values.  D. Diabetic peripheral neuropathy: The patient has had symptoms and signs of mild diabetic peripheral neuropathy at several times in the past. His peripheral neuropathic signs and symptoms similarly varied in parallel with his BGs and HbA1c values.   E. Non-proliferative diabetic retinopathy: This problem was identified during a diabetic eye exam in June 2010.  F. Hypogonadotropic hypogonadism: The patient has always been somewhat lackadaisical and relatively passive. On 06/29/10 his FSH was 4.9, LH was 2.4, testosterone was 399.66, and free testosterone was 79.8. The testosterone and free testosterone were relatively low for an 24 year old young man. At the same time the [facility] and LH while "normal", appeared to be somewhat inappropriately low for the level of testosterone he had. We discussed the options for treatment including exercise and medication, to include AndroGel. The patient  chose the exercise option. By 10/19/10 the [facility] was 6.8, the LH was 3.2, total testosterone was 615.88 and the free testosterone was 131.6. It may be  that when he was more depressed he had less gonadotropic function. We will continue to follow up on this issue over time.  G. Depression/Personality disorders:    1). The patient has been diagnosed with depression at different times in the past. For some time he took Remeron (mirtazapine), but discontinued this in early 2012. I had been trying to convince him for some time that he was depressed and that he should seek out help. He eventually did decide to seek psychiatric help.    2). On 06/08/13 Maycen was evaluated by [name], a noted psychiatrist in [place].  [name] and his staff performed neuropsychiatric testing. On the GAWAIN CROMBIE had problems with non-verbal reasoning and emotional intelligence. 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 [name] prescribed escitalopram, 10 mg/day. Scott Moran was supposed to continue to see [name] in follow up.   3). Scott Moran was then referred to a therapist, [name]. Arda enjoys working with Dr. [name] and feels that he is making progress.    H. 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   3. The patient's last [facility] visit was on 03/25/15. In the interim, he has been healthy. He says that he is still not checking his BGs and taking his insulin injections as often as he should. He remains frustrated that he can't make himself do better. When he tries to do better he does so for a few days, then slides backward.   A. At his last visit we continued his basal rates. He is having some BGs in the 50s-60s in the early afternoons.      Scott Moran saw [name], his therapist, about 10 days ago.    C. His sleep pattern still varies. Overall he has been sleeping better.    Scott Moran takes his Synthroid dose of 25 mcg/day every night. He is  not taking lisinopril and his escitalopram very often.   E. He is studying for his driver's permit and has been practice driving. This is a very big step for him. After years of avoiding driving, he is now looking forward to getting out more.  F. For the past several visits I have been attempting to utilize his interest in history to give him examples of historical personalities who have overcome adversity. Unfortunately, while Scott Moran enjoyed learning more about these historical figures,  he did not immediately grasp the lesson that to make changes in one's life, one has to step outside one's [place] zone and take on new challenges.  Now, perhaps, he is willing to take more risks.  4. Pertinent Review of Systems:  Constitutional: The patient feels "pretty OK", but he had some nausea earlier today. He feels a little bit happier and is looking forward to driving. Sometimes he is not depressed and sometimes he is more depressed.  He now leaves his house more often. He still spends many hours on the web.  Eyes: Vision is good with his glasses. There are no significant eye complaints. Last eye exam was in November or December 2014. There were no signs of diabetic retinopathy. He depends upon his parents to make appointments for him. Mouth: No  problems.  Neck: The patient has no complaints of anterior neck swelling, soreness, tenderness,  pressure, discomfort, or difficulty swallowing.  Heart: Heart rate sometimes feels fast. Heart rate increases with exercise or other physical activity. The patient has no complaints of palpitations, irregular heat beats, chest pain, or chest pressure. Gastrointestinal: As above. Bowel movents seem normal. The patient has no other complaints of excessive hunger, acid reflux, upset stomach, stomach aches or pains, diarrhea, or constipation. Legs: Muscle mass and strength seem normal. There are no complaints of numbness, tingling, burning, or pain. No edema is noted. Feet:  There are no obvious foot problems. There are no complaints of numbness, tingling, burning, or pain. No edema is noted. Hypoglycemia: He has had a few low BGs recently.   Emotional/psychological: The patient is a bit better. He is now willing to learn to drive and to use driving to expand his horizon somewhat. His inability to make himself take care of his DM self-care tasks on a regular and reliable basis is unchanged.  6. BG printout: He is changing his sites and/or his insulins about every 2-7 days. He sometimes changes the reservoirs, but not the tubing or the sites. Sometimes he changes the tubing, but not the sites. Sometimes he changes all three.  He checks BGs 0-8 times per day. He boluses 0-6 times per day. All of his low BGs occurred when he slept in late. All of his BGs > 400 occurred when his site and insulin went bad and he did not follow the Hyperglycemia Protocol for 6-8 hours. In the past month his average BG was 237, compared with 237 at last visit and with 244 at the visit prior. His BG range is 59 - > 400, compared with 48 - >400 at last visit and with 67 - >400 at the visit prior.  He sometimes goes up to 34 hours without checking BGs, but less frequently in the past month. He sometimes goes up to 26 hours without bolusing. The  longer he leaves sites in, and the longer he goes between BG checks and insulin boluses, the higher his BGs are. Sometimes when he has low BGs he over-treats them, resulting in BGs in the mid-to-upper 300s. He has had six documented BGs > 400, similar to his last visit. He has had three BGs < 80, compared with six at his last visit.   PAST MEDICAL, FAMILY, AND SOCIAL HISTORY:  Past Medical History  Diagnosis Date  . Type 1 diabetes mellitus not at goal   . Hypoglycemia associated with diabetes   . Goiter   . Thyroiditis, autoimmune   . Hypothyroidism, acquired, autoimmune   . Tachycardia   . Hypertension   . Hypogonadotropic hypogonadism   . Type 1  diabetes mellitus with diabetic autonomic neuropathy   . DM type 1 with diabetic peripheral neuropathy     Family History  Problem Relation Age of Onset  . Diabetes Maternal Uncle   . Hypertension Maternal Grandmother   . Thyroid disease Neg Hx   . Obesity Neg Hx   . Kidney disease Neg Hx   . Cancer Neg Hx   . Anemia Neg Hx      Current outpatient prescriptions:  .  escitalopram (LEXAPRO) 10 MG tablet, Take 1 tablet (10 mg total) by mouth daily., Disp: 90 tablet, Rfl: 3 .  levothyroxine (SYNTHROID, LEVOTHROID) 25 MCG tablet, Take one tablet daily., Disp: 90 tablet, Rfl: 3 .  NOVOLOG 100 UNIT/ML injection, USE 300 UNITS  IN PUMP EVERY 48-72 HOURS AND PER PROTOCOL FOR HYPERGLYCEMIA AND DKA OUTPATIENT TREATMENT., Disp: 120 mL, Rfl: 5 .  glucose blood (BAYER CONTOUR NEXT TEST) test strip, Use as instructed to check your blood sugar up to 6x daily. Please provide #90 day supply., Disp: 600 each, Rfl: 4  Allergies as of 07/20/2015  . (No Known Allergies)    1. Work and Family: Patient stopped attending junior college, in part due to family financial problems. He is still not working and is not interested in looking for work. 2. Activities: Few physical activities. He is a history buff and frequently participates in online chat room discussions. 3. Smoking, alcohol, or drugs: None 4. Primary Care Provider: He no longer has a primary care provider.  REVIEW OF SYSTEMS: There are no other significant problems involving Scott Moran's other body systems.   Objective:  Vital Signs:  BP 97/54 mmHg  Pulse 112  Wt 126 lb 9.6 oz (57.425 kg)    Ht Readings from Last 3 Encounters:  08/22/13 5' 7" (1.702 m)   Wt Readings from Last 3 Encounters:  07/20/15 126 lb 9.6 oz (57.425 kg)  03/25/15 131 lb 12.8 oz (59.784 kg)  12/09/14 127 lb 11.2 oz (57.924 kg)   PHYSICAL EXAM:  Constitutional: Scott Moran was bright and alert today. He knows what he should do to help himself, but he can't consistently  seem to do what he should do. He is again very chatty today. He appears healthy, but slim. His weight has decreased 5 pounds since last visit. His affect is very normal. He is very intelligent, and very insightful about his flaws. Unfortunately, he is not really motivated to make changes in his own behaviors.   Face: The face appears normal.  Eyes: There is no obvious arcus or proptosis. Moisture appears normal. Mouth: The oropharynx and tongue appear normal. Oral moisture is normal. Neck: The neck looks normal. No carotid bruits are noted. The thyroid gland is larger at about 21-22 grams in size today.  Both lobes are symmetrically enlarged. The consistency of the thyroid gland is normal today.The thyroid gland is not tender to palpation. Lungs: The lungs are clear to auscultation. Air movement is good. Heart: Heart rate remains tachycardic. Heart rhythm is regular. Heart sounds S1 and S2 are normal. I did not appreciate any pathologic cardiac murmurs. Abdomen: The abdomen is normal in size. Bowel sounds are normal. There is no obvious hepatomegaly, splenomegaly, or other mass effect.  Arms: Muscle size and bulk are normal for age. Hands: There is no obvious tremor. Phalangeal and metacarpophalangeal joints are normal. Palmar muscles are normal. Palmar skin is normal. Palmar moisture is also normal. Legs: Muscles appear normal for age. No edema is present. Feet: Feet are normally formed. Dorsalis pedal pulses are 1+ on the left and 1+ on the right.  Neurologic: Strength is normal for age in both the upper and lower extremities. Muscle tone is normal. Sensation to touch is normal in both legs, but decreased in the right heel.     LAB DATA:   Results for orders placed or performed in visit on 07/20/15 (from the past 504 hour(s))  POCT Glucose (Device for Home Use)   Collection Time: 07/20/15  1:26 PM  Result Value Ref Range   Glucose Fasting, POC  70 - 99 mg/dL   POC Glucose 170 (A) 70 - 99 mg/dl   POCT HgB A1C   Collection Time: 07/20/15  1:33 PM  Result Value Ref Range  Hemoglobin A1C 8.2    Labs 07/20/15: HbA1c 8.2%  Labs 03/25/15: HbA1c 7.8%  Labs 12/02/14: HbA1c 8.5%;  LH 4.0, FSH 8.2, testosterone 621.97; CMP normal; TSH 2.01, free T4 1.21, free T3 3.6; cholesterol 155, triglycerides 46, HDL  55, LDL 91; microalbumin/creatinine ratio 6.6  Labs 03/30/14: TSH 1.935, free T4 1.12, free T3 3.2; CMP: normal except for glucose 203; testosterone 624, FSH 6.3, LH 2.4; urine microalbumin/creatinine ratio 6.4; cholesterol 163, triglycerides 55, HDL 64, LDL 80  Labs 08/23/13: TSH 1.139; CMP normal except for a glucose of 145; CBC: WBC 5.4, Hgb 12.7, Hct 34.7%   Assessment and Plan:   ASSESSMENT:  1-2. Type 1 diabetes mellitus/hypoglycemia: Scott Moran is doing a bit worse in terms of HbA1c.  He is checking BGs somewhat more frequently and consistently. This month he only went one day without checking BGs. He is also bolusing more consistently. He has had six documented BGs > 400, all at one time due to a bad site and bad insulin. Marland Kitchen He has had three BGs < 80. When he checks BGs, boluses, and changes sites more frequently, his BGs can be very good. His lowest BGs occurred when he slept in unusually late.  3. Autonomic neuropathy and inappropriate sinus tachycardia: These problems are still severe. Fortunately, these problems will reverse with better BG control.  4. Peripheral neuropathy: This problem is a bit worse today. This problems is also reversible with improved BG control .   5. Hypothyroid: He was euthyroid in December 2015 on his current dose of Synthroid.   6-7. Hypogonadotropic hypogonadism: His testosterone level in December 2015 was essentially where it was in 2011. I had hoped that it would be higher when his depression improved, but he unfortunately stopped taking his escitalopram.  8. Hypertension: His BPs are very good today.   9. Goiter/Hashimoto's disease: His goiter is a bit  larger today. The pattern of waxing and waning of thyroid gland size is c/w evolving thyroiditis. Thyroiditis is clinically quiescent 10. Depression: This problem is about the same. He is not clinically depressed today, but also can't make himself what he needs to do. Because he enjoys history, I will continue to use historical persons to inspire him to take a more active role in his DM care and to move on with his life. 11. Noncompliance: He is doing somewhat better at times. I continue to hope that as he ages and matures, he will do a better job of taking care of his T1DM and his life.  12. Weight loss, unintentional: This problem has recurred. He is relatively underinsulinized.     PLAN:  1. Diagnostic: Follow the DM care plan. Call in 4 weeks to discuss BGs.  2. Therapeutic: Continue Synthroid. Resume lisinopril and escitalopram. Give both correction boluses and food boluses. Continue the current basal rates:  MN: 0.750 5 AM: 0.750 8 AM: 0.800 Noon: 0.950 6 PM: 1.500 9 PM: 1.200 3. Patient education: He knows what he should do and knows how to do it. Now he needs to commit to action. 4. Follow-up: 3 months  Level of Service: This visit lasted in excess of 45 minutes. More than 50% of the visit was devoted to counseling.  Sherrlyn Hock, MD 07/20/2015 1:59 PM

## 2015-07-28 ENCOUNTER — Telehealth: Payer: Self-pay | Admitting: "Endocrinology

## 2015-07-28 ENCOUNTER — Other Ambulatory Visit: Payer: Self-pay | Admitting: *Deleted

## 2015-07-28 DIAGNOSIS — E1065 Type 1 diabetes mellitus with hyperglycemia: Secondary | ICD-10-CM

## 2015-07-28 DIAGNOSIS — IMO0002 Reserved for concepts with insufficient information to code with codable children: Secondary | ICD-10-CM

## 2015-07-28 MED ORDER — INSULIN ASPART 100 UNIT/ML FLEXPEN: PEN_INJECTOR | SUBCUTANEOUS | Status: DC

## 2015-07-28 MED ORDER — INSULIN GLARGINE 100 UNIT/ML SOLOSTAR PEN: PEN_INJECTOR | SUBCUTANEOUS | Status: DC

## 2015-07-28 NOTE — Telephone Encounter (Signed)
1. [name] called. His pump gave him an error button. He called [employer] who will send a replacement pump to him by noon tomorrow. He has Novolog vials and insulin syringes.  2. I gave him his bolus settings that he can use until the new pump arrives:  A. Insulin:carb ratios:   Midnight: 15   11:30 AM: 12   4:30 PM: 15  B. Insulin sensitivity factor: From midnight to midnight: 65  C. BG targets:   Midnight: 150   6 AM: 110   10 PM: 150 3. His Correction dose will be BG minus BG target, divided by 65, rounded off to the nearest whole number. 4. His food dose will be one unit for every 12 or 15 grams of carbs depending upon the time of day or night that he eats.  5. He will need to take a Correction dose every 3-4 hours and a Food Dose every time that he eats until he is ready to begin treatment with his new pump. 6. When he is ready to begin his new pump, he will use the bolus settings above and will use his current basal rate settings:  Midnight: 0.75 units per hour  5 AM: 0.75  9 AM: 0.80  Noon: 0.95  6 PM: 1.50  9 PM: 1.20 7. Once the pump is working, he is to give a Correction Bolus every hour for three hours to speed up the process of restoring his abdominal insulin volume. 8. He is to call me if he has any problems. David Stall

## 2015-09-14 ENCOUNTER — Other Ambulatory Visit: Payer: Self-pay | Admitting: Pediatrics

## 2015-09-14 ENCOUNTER — Telehealth: Payer: Self-pay | Admitting: "Endocrinology

## 2015-09-14 DIAGNOSIS — E109 Type 1 diabetes mellitus without complications: Secondary | ICD-10-CM

## 2015-09-14 MED ORDER — ACETONE (URINE) TEST VI STRP: ORAL_STRIP | Status: AC

## 2015-09-14 NOTE — Telephone Encounter (Signed)
Routed to provider

## 2015-09-14 NOTE — Telephone Encounter (Signed)
Around 6 am woke up and had honey bun. Vomited.  Has been throwing up all day. Has been doing sick day protocol. Last vomit was about 3:40 pm. He is drinking quite a bit of juice at a time and will sometimes throw it up if it is too much. He does not have his binder handy for sick day protocol.   Glucoses 200s-300s. Has been doing corrections. Continues to drink juice and has not switched to water despite glucose being >300.    Has not been missing insulin. No diarrhea, no fever.  Discussed sick day protocol in depth with [name] and his mother. They do not have any current ketone strips at home. Sent strips to pharmacy for them to pick up. He will continue to drink 4 oz every 30 minutes as able of water if glucose >250. Below 250 will use juice. Corrections every 2.5-3 hours. They will call back if large ketones with continued vomiting before going to the ED. It makes him very stressed to leave the house.

## 2015-09-15 ENCOUNTER — Emergency Department (HOSPITAL_COMMUNITY)
Admission: EM | Admit: 2015-09-15 | Discharge: 2015-09-15 | Disposition: A | Discharge status: Home / Self Care | Payer: BLUE CROSS/BLUE SHIELD | Attending: Emergency Medicine | Admitting: Emergency Medicine

## 2015-09-15 ENCOUNTER — Telehealth: Payer: Self-pay | Admitting: "Endocrinology

## 2015-09-15 ENCOUNTER — Encounter (HOSPITAL_COMMUNITY): Payer: Self-pay | Admitting: Emergency Medicine

## 2015-09-15 DIAGNOSIS — E1065 Type 1 diabetes mellitus with hyperglycemia: Secondary | ICD-10-CM | POA: Insufficient documentation

## 2015-09-15 DIAGNOSIS — Z79899 Other long term (current) drug therapy: Secondary | ICD-10-CM | POA: Diagnosis not present

## 2015-09-15 DIAGNOSIS — E1042 Type 1 diabetes mellitus with diabetic polyneuropathy: Secondary | ICD-10-CM | POA: Diagnosis not present

## 2015-09-15 DIAGNOSIS — I10 Essential (primary) hypertension: Secondary | ICD-10-CM | POA: Insufficient documentation

## 2015-09-15 DIAGNOSIS — E1043 Type 1 diabetes mellitus with diabetic autonomic (poly)neuropathy: Secondary | ICD-10-CM | POA: Insufficient documentation

## 2015-09-15 DIAGNOSIS — R112 Nausea with vomiting, unspecified: Secondary | ICD-10-CM | POA: Insufficient documentation

## 2015-09-15 DIAGNOSIS — Z794 Long term (current) use of insulin: Secondary | ICD-10-CM | POA: Diagnosis not present

## 2015-09-15 DIAGNOSIS — R000 Tachycardia, unspecified: Secondary | ICD-10-CM | POA: Insufficient documentation

## 2015-09-15 DIAGNOSIS — R739 Hyperglycemia, unspecified: Secondary | ICD-10-CM

## 2015-09-15 DIAGNOSIS — E039 Hypothyroidism, unspecified: Secondary | ICD-10-CM | POA: Diagnosis not present

## 2015-09-15 LAB — COMPREHENSIVE METABOLIC PANEL
ALK PHOS: 73 U/L (ref 38–126)
ALT: 18 U/L (ref 17–63)
AST: 20 U/L (ref 15–41)
Albumin: 4.9 g/dL (ref 3.5–5.0)
Anion gap: 21 — ABNORMAL HIGH (ref 5–15)
BUN: 16 mg/dL (ref 6–20)
CHLORIDE: 95 mmol/L — AB (ref 101–111)
CO2: 17 mmol/L — AB (ref 22–32)
CREATININE: 1.59 mg/dL — AB (ref 0.61–1.24)
Calcium: 10 mg/dL (ref 8.9–10.3)
GFR, EST NON AFRICAN AMERICAN: 60 mL/min — AB (ref >60)
GLUCOSE: 253 mg/dL — AB (ref 65–99)
POTASSIUM: 4.6 mmol/L (ref 3.5–5.1)
Sodium: 133 mmol/L — ABNORMAL LOW (ref 135–145)
Total Bilirubin: 1.8 mg/dL — ABNORMAL HIGH (ref 0.3–1.2)
Total Protein: 8.8 g/dL — ABNORMAL HIGH (ref 6.5–8.1)

## 2015-09-15 LAB — URINALYSIS, ROUTINE W REFLEX MICROSCOPIC
GLUCOSE, UA: 500 mg/dL — AB
Leukocytes, UA: NEGATIVE
Nitrite: NEGATIVE
PH: 5.5 (ref 5.0–8.0)
Protein, ur: 100 mg/dL — AB
SPECIFIC GRAVITY, URINE: 1.03 (ref 1.005–1.030)
Urobilinogen, UA: 0.2 mg/dL (ref 0.0–1.0)

## 2015-09-15 LAB — CBC
HEMATOCRIT: 47.7 % (ref 39.0–52.0)
Hemoglobin: 17.5 g/dL — ABNORMAL HIGH (ref 13.0–17.0)
MCH: 30.5 pg (ref 26.0–34.0)
MCHC: 36.7 g/dL — AB (ref 30.0–36.0)
MCV: 83.1 fL (ref 78.0–100.0)
PLATELETS: 342 10*3/uL (ref 150–400)
RBC: 5.74 MIL/uL (ref 4.22–5.81)
RDW: 12.2 % (ref 11.5–15.5)
WBC: 15 10*3/uL — ABNORMAL HIGH (ref 4.0–10.5)

## 2015-09-15 LAB — BASIC METABOLIC PANEL
ANION GAP: 12 (ref 5–15)
BUN: 13 mg/dL (ref 6–20)
CALCIUM: 8.3 mg/dL — AB (ref 8.9–10.3)
CO2: 18 mmol/L — ABNORMAL LOW (ref 22–32)
Chloride: 105 mmol/L (ref 101–111)
Creatinine, Ser: 1.06 mg/dL (ref 0.61–1.24)
GFR calc Af Amer: >60 mL/min (ref >60)
GLUCOSE: 143 mg/dL — AB (ref 65–99)
POTASSIUM: 4.3 mmol/L (ref 3.5–5.1)
SODIUM: 135 mmol/L (ref 135–145)

## 2015-09-15 LAB — I-STAT VENOUS BLOOD GAS, ED
Acid-base deficit: 10 mmol/L — ABNORMAL HIGH (ref 0.0–2.0)
Bicarbonate: 15.6 mEq/L — ABNORMAL LOW (ref 20.0–24.0)
O2 SAT: 61 %
PCO2 VEN: 34.2 mmHg — AB (ref 45.0–50.0)
PO2 VEN: 36 mmHg (ref 30.0–45.0)
TCO2: 17 mmol/L (ref 0–100)
pH, Ven: 7.267 (ref 7.250–7.300)

## 2015-09-15 LAB — CBG MONITORING, ED
Glucose-Capillary: 251 mg/dL — ABNORMAL HIGH (ref 65–99)
Glucose-Capillary: 154 mg/dL — ABNORMAL HIGH (ref 65–99)

## 2015-09-15 LAB — URINE MICROSCOPIC-ADD ON

## 2015-09-15 MED ORDER — PROMETHAZINE HCL 25 MG PO TABS: 25.0000 mg | ORAL_TABLET | Freq: Four times a day (QID) | ORAL | Status: DC | PRN

## 2015-09-15 MED ORDER — SODIUM CHLORIDE 0.9 % IV BOLUS (SEPSIS)
1000.0000 mL | Freq: Once | INTRAVENOUS | Status: AC
  Administered 2015-09-15: 1000 mL via INTRAVENOUS

## 2015-09-15 MED ORDER — ONDANSETRON 4 MG PO TBDP: 4.0000 mg | ORAL_TABLET | Freq: Three times a day (TID) | ORAL | Status: DC | PRN

## 2015-09-15 MED ORDER — ONDANSETRON 4 MG PO TBDP
4.0000 mg | ORAL_TABLET | Freq: Once | ORAL | Status: AC | PRN
  Administered 2015-09-15: 4 mg via ORAL

## 2015-09-15 MED ORDER — ONDANSETRON HCL 4 MG/2ML IJ SOLN
4.0000 mg | Freq: Once | INTRAMUSCULAR | Status: AC
  Administered 2015-09-15: 4 mg via INTRAVENOUS
  Filled 2015-09-15: qty 2

## 2015-09-15 MED ORDER — ONDANSETRON 4 MG PO TBDP
ORAL_TABLET | ORAL | Status: DC
Start: 2015-09-15 — End: 2015-09-16
  Filled 2015-09-15: qty 1

## 2015-09-15 NOTE — Telephone Encounter (Signed)
1 

## 2015-09-15 NOTE — ED Notes (Signed)
CBG 154 mg/dL reported to Tod PersiaEmily Bivens RN

## 2015-09-15 NOTE — ED Notes (Signed)
POCT CBG resulted 251; Tammy SoursGreg, RN present in room

## 2015-09-15 NOTE — Telephone Encounter (Signed)
1. [name] called me this morning. He has had nausea and vomiting for three days. After purchasing new ketone strips, his ketones progressed to large by this morning. In retrospect, he had not changed his pump site in at least 3 days. 2. I reviewed the DKA and Sick Day protocols with him and his mother. I asked him to give himself an injection of rapid-acting insulin by pen, then change the pump insertion site, then check BG every hour and give a correction bolus every hour. Since his BGs then were <200, I asked the family to give him 8 oz. of glucose-containing fluid every 30 minutes in an effort to suppress ketones by transporting more glucose into cells. 3. I called him back at 1 PM and spoke with his dad. [name] continues to have nausea and vomiting. His ketones are not improving. I suggested to dad that he take [name] [facility]. 4. Although I will be leaving town shortly on a weekend vacation, I will remain on call for [name] and his care givers. My cell phone number is [phone]. David StallBRENNAN,Edna J

## 2015-09-15 NOTE — ED Provider Notes (Signed)
CSN: [id number]     Arrival date & time 09/15/15  1416 History   First MD Initiated Contact with Patient 09/15/15 1516     Chief Complaint  Patient presents with  . Emesis  . Blood Sugar Problem     (Consider location/radiation/quality/duration/timing/severity/associated sxs/prior Treatment) HPI Comments: Pt comes in with c/o vomiting and high blood sugar that started yesterday. He is a type 1 diabetic and he is has a insulin pump. He has had multiple episodes of vomiting. He is unable to keep food or fluids down consistently. He is having ketones in his urine. He is not having any abdominal pain or diarrhea. He states that he has had some cramping. No fever.  The history is provided by the patient. No language interpreter was used.    Past Medical History  Diagnosis Date  . Type 1 diabetes mellitus not at goal [facility](HCC)   . Hypoglycemia associated with diabetes (HCC)   . Goiter   . Thyroiditis, autoimmune   . Hypothyroidism, acquired, autoimmune   . Tachycardia   . Hypertension   . Hypogonadotropic hypogonadism (HCC)   . Type 1 diabetes mellitus with diabetic autonomic neuropathy (HCC)   . DM type 1 with diabetic peripheral neuropathy [facility](HCC)    Past Surgical History  Procedure Laterality Date  . None     Family History  Problem Relation Age of Onset  . Diabetes Maternal Uncle   . Hypertension Maternal Grandmother   . Thyroid disease Neg Hx   . Obesity Neg Hx   . Kidney disease Neg Hx   . Cancer Neg Hx   . Anemia Neg Hx    Social History  Substance Use Topics  . Smoking status: Never Smoker   . Smokeless tobacco: Never Used  . Alcohol Use: No    Review of Systems  All other systems reviewed and are negative.     Allergies  Review of patient's allergies indicates no known allergies.  Home Medications   Prior to Admission medications   Medication Sig Start Date End Date Taking? Authorizing Provider  acetone, urine, test strip Check ketones per protocol  09/14/15   Verneda Skillaroline T Hacker, FNP  escitalopram (LEXAPRO) 10 MG tablet Take 1 tablet (10 mg total) by mouth daily. 03/16/14   David StallMichael J Brennan, MD  glucose blood (BAYER CONTOUR NEXT TEST) test strip Use as instructed to check your blood sugar up to 6x daily. Please provide #90 day supply. 04/05/14 04/05/15  David StallMichael J Brennan, MD  insulin aspart (NOVOLOG FLEXPEN) 100 UNIT/ML FlexPen Use up to 50 units daily 07/28/15   David StallMichael J Brennan, MD  Insulin Glargine (LANTUS SOLOSTAR) 100 UNIT/ML Solostar Pen Use up to 50 units daily 07/28/15   David StallMichael J Brennan, MD  levothyroxine (SYNTHROID, LEVOTHROID) 25 MCG tablet Take one tablet daily. 02/21/15 02/22/16  David StallMichael J Brennan, MD  NOVOLOG 100 UNIT/ML injection USE 300 UNITS IN PUMP EVERY 48-72 HOURS AND PER PROTOCOL FOR HYPERGLYCEMIA AND DKA OUTPATIENT TREATMENT. 04/21/15   David StallMichael J Brennan, MD   BP 123/77 mmHg  Pulse 119  Temp(Src) 99.3 F (37.4 C) (Oral)  Ht 5\' 9"  (1.753 m)  Wt 126 lb (57.153 kg)  BMI 18.60 kg/m2  SpO2 97% Physical Exam  Constitutional: He is oriented to person, place, and time. He appears well-developed and well-nourished.  HENT:  Head: Normocephalic and atraumatic.  Cardiovascular: Normal rate and regular rhythm.   Pulmonary/Chest: Effort normal and breath sounds normal.  Abdominal: Soft. Bowel sounds are normal. There  is no tenderness. There is no rebound.  Musculoskeletal: Normal range of motion.  Neurological: He is alert and oriented to person, place, and time.  Skin: There is pallor.  Nursing note and vitals reviewed.   ED Course  Procedures (including critical care time) Labs Review Labs Reviewed  COMPREHENSIVE METABOLIC PANEL - Abnormal; Notable for the following:    Sodium 133 (*)    Chloride 95 (*)    CO2 17 (*)    Glucose, Bld 253 (*)    Creatinine, Ser 1.59 (*)    Total Protein 8.8 (*)    Total Bilirubin 1.8 (*)    GFR calc non Af Amer 60 (*)    Anion gap 21 (*)    All other components within normal limits   CBC - Abnormal; Notable for the following:    WBC 15.0 (*)    Hemoglobin 17.5 (*)    MCHC 36.7 (*)    All other components within normal limits  URINALYSIS, ROUTINE W REFLEX MICROSCOPIC (NOT AT [facility]) - Abnormal; Notable for the following:    APPearance CLOUDY (*)    Glucose, UA 500 (*)    Hgb urine dipstick MODERATE (*)    Bilirubin Urine MODERATE (*)    Ketones, ur >80 (*)    Protein, ur 100 (*)    All other components within normal limits  BASIC METABOLIC PANEL - Abnormal; Notable for the following:    CO2 18 (*)    Glucose, Bld 143 (*)    Calcium 8.3 (*)    All other components within normal limits  CBG MONITORING, ED - Abnormal; Notable for the following:    Glucose-Capillary 251 (*)    All other components within normal limits  CBG MONITORING, ED - Abnormal; Notable for the following:    Glucose-Capillary 154 (*)    All other components within normal limits  I-STAT VENOUS BLOOD GAS, ED - Abnormal; Notable for the following:    pCO2, Ven 34.2 (*)    Bicarbonate 15.6 (*)    Acid-base deficit 10.0 (*)    All other components within normal limits  URINE MICROSCOPIC-ADD ON  BLOOD GAS, VENOUS  CBG MONITORING, ED    Imaging Review No results found. I have personally reviewed and evaluated these images and lab results as part of my medical decision-making.   EKG Interpretation None      MDM   Final diagnoses:  Non-intractable vomiting with nausea, vomiting of unspecified type  Hyperglycemia    6:32 PM Pt is not in DKA. Tolerating po without any problem 8:04 PM Pt is continuing to tolerate po here. Labs improving. Pt has chronic tachycardia. Pt is okay to follow up with endocrinologist. Discussed return precautions with pt    Teressa Lower, NP 09/15/15 2008  Elwin Mocha, MD 09/26/15 [phone]

## 2015-09-15 NOTE — ED Notes (Signed)
Onset one day ago emesis with multiple episodes continuing today. General abdominal dull pain intermittent currently 0/10. History of diabetes type I.

## 2015-09-15 NOTE — Discharge Instructions (Signed)

## 2015-09-15 NOTE — ED Notes (Signed)
Discharge instructions/prescriptions reviewed with patient/family. Understanding verbalized. Denies pain. No acute distress noted.

## 2015-09-16 ENCOUNTER — Telehealth: Payer: Self-pay | Admitting: *Deleted

## 2015-09-16 NOTE — Telephone Encounter (Signed)
Mom called and advised that Scott NeedleMichael was in the ER last night and was sent home. He is doing much better. I advised that if they have any more problems please call the oncall doctor. I will have a copy of the [facility] protocols for them at the Nov. Visit.

## 2015-10-20 ENCOUNTER — Encounter: Payer: Self-pay | Admitting: "Endocrinology

## 2015-10-20 ENCOUNTER — Ambulatory Visit (INDEPENDENT_AMBULATORY_CARE_PROVIDER_SITE_OTHER): Payer: BLUE CROSS/BLUE SHIELD | Admitting: "Endocrinology

## 2015-10-20 VITALS — BP 117/79 | HR 113 | Wt 126.0 lb

## 2015-10-20 DIAGNOSIS — I4711 Inappropriate sinus tachycardia, so stated: Secondary | ICD-10-CM

## 2015-10-20 DIAGNOSIS — Z91199 Patient's noncompliance with other medical treatment and regimen due to unspecified reason: Secondary | ICD-10-CM

## 2015-10-20 DIAGNOSIS — F329 Major depressive disorder, single episode, unspecified: Secondary | ICD-10-CM

## 2015-10-20 DIAGNOSIS — E230 Hypopituitarism: Secondary | ICD-10-CM

## 2015-10-20 DIAGNOSIS — IMO0001 Reserved for inherently not codable concepts without codable children: Secondary | ICD-10-CM

## 2015-10-20 DIAGNOSIS — Z23 Encounter for immunization: Secondary | ICD-10-CM | POA: Diagnosis not present

## 2015-10-20 DIAGNOSIS — E1043 Type 1 diabetes mellitus with diabetic autonomic (poly)neuropathy: Secondary | ICD-10-CM | POA: Diagnosis not present

## 2015-10-20 DIAGNOSIS — Z9119 Patient's noncompliance with other medical treatment and regimen: Secondary | ICD-10-CM

## 2015-10-20 DIAGNOSIS — E109 Type 1 diabetes mellitus without complications: Secondary | ICD-10-CM | POA: Diagnosis not present

## 2015-10-20 DIAGNOSIS — E038 Other specified hypothyroidism: Secondary | ICD-10-CM

## 2015-10-20 DIAGNOSIS — F32A Depression, unspecified: Secondary | ICD-10-CM

## 2015-10-20 DIAGNOSIS — E063 Autoimmune thyroiditis: Secondary | ICD-10-CM

## 2015-10-20 DIAGNOSIS — R000 Tachycardia, unspecified: Secondary | ICD-10-CM

## 2015-10-20 DIAGNOSIS — E1065 Type 1 diabetes mellitus with hyperglycemia: Principal | ICD-10-CM

## 2015-10-20 DIAGNOSIS — E1042 Type 1 diabetes mellitus with diabetic polyneuropathy: Secondary | ICD-10-CM

## 2015-10-20 DIAGNOSIS — E049 Nontoxic goiter, unspecified: Secondary | ICD-10-CM

## 2015-10-20 DIAGNOSIS — E10649 Type 1 diabetes mellitus with hypoglycemia without coma: Secondary | ICD-10-CM | POA: Diagnosis not present

## 2015-10-20 LAB — POCT GLYCOSYLATED HEMOGLOBIN (HGB A1C): Hemoglobin A1C: 8.1

## 2015-10-20 LAB — GLUCOSE, POCT (MANUAL RESULT ENTRY): POC GLUCOSE: 231 mg/dL — AB (ref 70–99)

## 2015-10-20 NOTE — Progress Notes (Addendum)
Subjective:  Patient Name: Scott Moran Date of Birth: 04-28-1991  MRN: [id number]  Scott Moran  presents to the office today for follow-up of his type 1 diabetes mellitus, goiter, hypoglycemia, depression, social anxiety disorder, avoidant personality disorder, hypothyroidism, thyroiditis, diabetic autonomic neuropathy, inappropriate sinus tachycardia, non-proliferative diabetic retinopathy, diabetic peripheral neuropathy, fatigue, hypogonadotropic hypogonadism, and medical non-compliance.  HISTORY OF PRESENT ILLNESS:   Scott Moran is a 24 y.o. Caucasian young man. Scott Moran was unaccompanied.   1. I first saw the patient in consultation on 03/27/05 in the [facility] in [place], [place]. He had been referred by his primary care provider, [name], in [place], [place], for evaluation and treatment of poorly controlled type 1 diabetes. The patient was 24 years old.  A. The patient had been diagnosed with type 1 diabetes in February of 1995 at age 24. He was on an insulin pump when I first met him. His hemoglobin A1c had increased from 7.1% in December of 2005 to 8.2% in April 2006. He was having frequent snacks that he was not taking boluses for. He was also frequently noncompliant with checking blood sugars and taking meal boluses. I adjusted his basal rates.  B. At his next [place] visit on 06/19/05, he was still very noncompliant. His mental status was normal. He had a 25 gram goiter. His father was serving in [place] with the [employer]. There was a lot of stress at home. I again adjusted his basal rates.  C. At the end of July 2006 we closed our satellite office in [place]. The patient elected to be followed at our clinic here in [place]. I have followed him here ever since.  2. During the past ten years, the patient has had several clinical issues.   A. T1DM: The patient's blood glucose control has usually been fairly good for a teenager.  His maximum hemoglobin A1c occurred on 04/16/06 when the hemoglobin A1c was 12.1%. Since then the hemoglobin A1c values have varied between 6.9% and 8.6%. He is still fairly noncompliant with checking blood sugars, bolusing, and changing his insulin pump sites on time, but he is more compliant and more careful than he used to be. He has not required hospital re-admission for DKA, severe hyperglycemia, or hypoglycemia.   B. Goiter, Hashimoto's thyroiditis, and hypothyroidism: In late 2008 and early 2009 the patient developed hypothyroidism secondary to Hashimoto's disease. I started him on Synthroid, 25 mcg per day. When he takes his medication, he is euthyroid.  C. Autonomic neuropathy and inappropriate sinus tachycardia: In late 2008 he developed autonomic neuropathy which manifested as inappropriate sinus tachycardia. These problems varied in parallel with his BGs and HbA1c values.  D. Diabetic peripheral neuropathy: The patient has had symptoms and signs of mild diabetic peripheral neuropathy at several times in the past. His peripheral neuropathic signs and symptoms similarly varied in parallel with his BGs and HbA1c values.   E. Non-proliferative diabetic retinopathy: This problem was identified during a diabetic eye exam in June 2010.  F. Hypogonadotropic hypogonadism: The patient has always been somewhat lackadaisical and relatively passive. On 06/29/10 his FSH was 4.9, LH was 2.4, testosterone was 399.66, and free testosterone was 79.8. The testosterone and free testosterone were relatively low for an 24 year old young man. At the same time the [facility] and LH while "normal", appeared to be somewhat inappropriately low for the level of testosterone he had. We discussed the options for treatment including exercise and medication, to include AndroGel. The patient  chose the exercise option. By 10/19/10 the [facility] was 6.8, the LH was 3.2, total testosterone was 615.88 and the free testosterone was 131.6. It may be  that when he was more depressed he had less gonadotropic function. We will continue to follow up on this issue over time.  G. Depression/Personality disorders:    1). The patient has been diagnosed with depression at different times in the past. For some time he took Remeron (mirtazapine), but discontinued this in early 2012. I had been trying to convince him for some time that he was depressed and that he should seek out help. He eventually did decide to seek psychiatric help.    2). On 06/08/13 Scott Moran was evaluated by [name], a noted psychiatrist in [place].  [name] and his staff performed neuropsychiatric testing. On the CEDRIK HEINDL had problems with non-verbal reasoning and emotional intelligence. 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 [name] prescribed escitalopram, 10 mg/day. Scott Moran was supposed to continue to see [name] in follow up. Unfortunately, Scott Moran has not followed up.    3). Scott Moran was then referred to a therapist, [name]. Brixon enjoys working with Dr. [name] and feels that he is making progress.    H. 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   3. The patient's last [facility] visit was on 07/20/15. In the interim, he has been healthy except for a visit to the ED on 09/15/15 for nausea and vomiting. He says that he is still variable about checking his BGs and taking his insulin boluses as often as he should. When he tries to do better he does so for a few days, then slides backward.   A. At his last visit we continued his basal rates. He is now having some BGs in the 50s-60s in the early in the mornings.     [name] saw [name], his therapist, 6 days ago.  Sal feels that he is making progress.   C. His sleep pattern still varies. Overall he  has been sleeping better.    Juanda Bond takes his Synthroid dose of 25 mcg/day every night. He is not taking lisinopril and his escitalopram very often.   E. He is studying for his driver's permit and has been practice driving. This is a very big step for him. After years of avoiding driving, he is now looking forward to getting out more.   4. Pertinent Review of Systems:  Constitutional: Scott Moran feels "pretty good". He is looking forward to driving. Sometimes he is not depressed and sometimes he is more depressed.  He now leaves his house more often. He still spends many hours on the web.  Eyes: Vision is good with his glasses. There are no significant eye complaints. Last eye exam was a few weeks ago.  There were no signs of diabetic retinopathy. Mouth: No problems.  Neck: The patient has no complaints of anterior neck swelling, soreness, tenderness,  pressure, discomfort, or difficulty swallowing.  Heart: Heart rate sometimes feels fast. Heart rate increases with exercise or other physical activity. The patient has no complaints of palpitations, irregular heat beats, chest pain, or chest pressure. Gastrointestinal: As above. Bowel movents seem normal. The patient has no other complaints of excessive hunger, acid reflux, upset stomach, stomach aches or pains, diarrhea, or constipation. Legs: Muscle  mass and strength seem normal. There are no complaints of numbness, tingling, burning, or pain. No edema is noted. Feet: There are no obvious foot problems. There are no complaints of numbness, tingling, burning, or pain. No edema is noted. Hypoglycemia: He has had a few low BGs recently.   Emotional/psychological: The patient is a bit better. He is now willing to learn to drive and to use driving to expand his horizon somewhat. His inability to make himself take care of his DM self-care tasks on a regular and reliable basis is unchanged.  6. BG printout: He is changing his sites and/or his insulins  about every 2-5 days. He sometimes changes the reservoirs, but not the tubing or the sites. Sometimes he changes the tubing, but not the sites. Sometimes he changes all three.  He checks BGs 0-4 times per day. The longest he's gone between BG checks was 46 hours. He boluses 0-5 times per day. The longest he's gone between boluses was 31 hours. He has had 11 BGs < 80, 9 of which occurred when he slept in late and did not use his temporary basal function.  In the past month his average BG was 211, compared with 237 at last visit and with 237 at the visit prior. His BG range is 58 -> 377. The  longer he leaves sites in, and the longer he goes between BG checks and insulin boluses, the higher his BGs are. Sometimes when he has low BGs he over-treats them, resulting in BGs in the mid-to-upper 300s. He has not had any documented BGs > 400.   PAST MEDICAL, FAMILY, AND SOCIAL HISTORY:  Past Medical History  Diagnosis Date  . Type 1 diabetes mellitus not at goal [facility])   . Hypoglycemia associated with diabetes ([place])   . Goiter   . Thyroiditis, autoimmune   . Hypothyroidism, acquired, autoimmune   . Tachycardia   . Hypertension   . Hypogonadotropic hypogonadism ([place])   . Type 1 diabetes mellitus with diabetic autonomic neuropathy ([place])   . DM type 1 with diabetic peripheral neuropathy (HCC)     Family History  Problem Relation Age of Onset  . Diabetes Maternal Uncle   . Hypertension Maternal Grandmother   . Thyroid disease Neg Hx   . Obesity Neg Hx   . Kidney disease Neg Hx   . Cancer Neg Hx   . Anemia Neg Hx      Current outpatient prescriptions:  .  acetone, urine, test strip, Check ketones per protocol, Disp: 50 each, Rfl: 3 .  Insulin Human (INSULIN PUMP) SOLN, Inject into the skin. Medication: Novolog: at  At 5am= .75, at 8am= .80, at 12noon= .950,  6pm=1.50, 9pm= 1.20, total of 23units per day. Per mother and farther, Disp: , Rfl:  .  levothyroxine (SYNTHROID, LEVOTHROID) 25 MCG tablet,  Take one tablet daily. (Patient taking differently: Take 25 mcg by mouth daily. Take one tablet daily.), Disp: 90 tablet, Rfl: 3 .  escitalopram (LEXAPRO) 10 MG tablet, Take 1 tablet (10 mg total) by mouth daily. (Patient not taking: Reported on 09/15/2015), Disp: 90 tablet, Rfl: 3 .  glucose blood (BAYER CONTOUR NEXT TEST) test strip, Use as instructed to check your blood sugar up to 6x daily. Please provide #90 day supply., Disp: 600 each, Rfl: 4 .  insulin aspart (NOVOLOG FLEXPEN) 100 UNIT/ML FlexPen, Use up to 50 units daily (Patient not taking: Reported on 09/15/2015), Disp: 5 pen, Rfl: 6 .  Insulin Glargine (LANTUS SOLOSTAR)  100 UNIT/ML Solostar Pen, Use up to 50 units daily (Patient not taking: Reported on 09/15/2015), Disp: 5 pen, Rfl: 6 .  NOVOLOG 100 UNIT/ML injection, USE 300 UNITS IN PUMP EVERY 48-72 HOURS AND PER PROTOCOL FOR HYPERGLYCEMIA AND DKA OUTPATIENT TREATMENT. (Patient not taking: Reported on 09/15/2015), Disp: 120 mL, Rfl: 5 .  ondansetron (ZOFRAN ODT) 4 MG disintegrating tablet, Take 1 tablet (4 mg total) by mouth every 8 (eight) hours as needed for nausea or vomiting. (Patient not taking: Reported on 10/20/2015), Disp: 20 tablet, Rfl: 0 .  promethazine (PHENERGAN) 25 MG tablet, Take 1 tablet (25 mg total) by mouth every 6 (six) hours as needed for nausea or vomiting. (Patient not taking: Reported on 10/20/2015), Disp: 20 tablet, Rfl: 0  Allergies as of 10/20/2015  . (No Known Allergies)    1. Work and Family: Patient stopped attending junior college, in part due to family financial problems. He is still not working and is not interested in looking for work. 2. Activities: Few physical activities. He is a history buff and frequently participates in online chat room discussions. 3. Smoking, alcohol, or drugs: None 4. Primary Care Provider: He no longer has a primary care provider.  REVIEW OF SYSTEMS: There are no other significant problems involving Scott Moran's other body  systems.   Objective:  Vital Signs:  BP 117/79 mmHg  Pulse 113  Wt 126 lb (57.153 kg)    Ht Readings from Last 3 Encounters:  09/15/15 _0  (1.753 m)  08/22/13 _1  (1.702 m)   Wt Readings from Last 3 Encounters:  10/20/15 126 lb (57.153 kg)  09/15/15 126 lb (57.153 kg)  07/20/15 126 lb 9.6 oz (57.425 kg)   PHYSICAL EXAM:  Constitutional: Scott Moran was bright and alert today, but still very slender. He knows what he should do to help himself, but he can't consistently seem to do what he should do. He is again very chatty. He is more worried about the state of the nation than about his health status. His affect is normal. He is very intelligent, and very insightful about his flaws. Unfortunately, he is not really motivated to make changes in his own behaviors.   Face: The face appears normal.  Eyes: There is no obvious arcus or proptosis. Moisture appears normal. Mouth: The oropharynx and tongue appear normal. Oral moisture is normal. Neck: The neck looks normal. No carotid bruits are noted. The thyroid gland is about the same at 21-22 grams in size today.  Both lobes are symmetrically enlarged. The consistency of the thyroid gland is normal today.The thyroid gland is not tender to palpation. Lungs: The lungs are clear to auscultation. Air movement is good. Heart: Heart rate remains tachycardic. Heart rhythm is regular. Heart sounds S1 and S2 are normal. I did not appreciate any pathologic cardiac murmurs. Abdomen: The abdomen is normal in size. Bowel sounds are normal. There is no obvious hepatomegaly, splenomegaly, or other mass effect.  Arms: Muscle size and bulk are normal for age. Hands: There is no obvious tremor. Phalangeal and metacarpophalangeal joints are normal. Palmar muscles are normal. Palmar skin is normal. Palmar moisture is also normal. Legs: Muscles appear normal for age. No edema is present. Feet: Feet are normally formed. Dorsalis pedal pulses are 1+ bilaterally.    Neurologic: Strength is normal for age in both the upper and lower extremities. Muscle tone is normal. Sensation to touch is normal in both legs, but decreased in both heels.     LAB DATA:  Results for orders placed or performed in visit on 10/20/15 (from the past 504 hour(s))  POCT Glucose (CBG)   Collection Time: 10/20/15  2:10 PM  Result Value Ref Range   POC Glucose 231 (A) 70 - 99 mg/dl  POCT HgB A1C   Collection Time: 10/20/15  2:19 PM  Result Value Ref Range   Hemoglobin A1C 8.1    Labs 10/20/15: HbA1c 8.1%  Labs 07/20/15: HbA1c 8.2%  Labs 03/25/15: HbA1c 7.8%  Labs 12/02/14: HbA1c 8.5%;  LH 4.0, FSH 8.2, testosterone 621.97; CMP normal; TSH 2.01, free T4 1.21, free T3 3.6; cholesterol 155, triglycerides 46, HDL  55, LDL 91; microalbumin/creatinine ratio 6.6  Labs 03/30/14: TSH 1.935, free T4 1.12, free T3 3.2; CMP: normal except for glucose 203; testosterone 624, FSH 6.3, LH 2.4; urine microalbumin/creatinine ratio 6.4; cholesterol 163, triglycerides 55, HDL 64, LDL 80  Labs 08/23/13: TSH 1.139; CMP normal except for a glucose of 145; CBC: WBC 5.4, Hgb 12.7, Hct 34.7%   Assessment and Plan:   ASSESSMENT:  1-2. Type 1 diabetes mellitus/hypoglycemia: Scott Moran is doing a bit better in terms of HbA1c, in part to having too many low BGs. He still checks BGs inconsistently. This month he again went only one day without checking BGs.  When he checks BGs, boluses, and changes sites more frequently, his BGs can be very good. His lowest BGs occurred when he slept in late.  3. Autonomic neuropathy and inappropriate sinus tachycardia: These problems are still severe. Fortunately, these problems will reverse with better BG control.  4. Peripheral neuropathy: This problem is a bit worse today. This problems is also reversible with improved BG control.  5. Hypothyroid: He was euthyroid in December 2015 on his current dose of Synthroid.   6. Hypogonadotropic hypogonadism: His testosterone  level in December 2015 was essentially where it was in 2011. I had hoped that it would be higher when his depression improved, but he unfortunately stopped taking his escitalopram.  7. Hypertension: His BPs are higher today. He needs to take his lisinopril.   8. Goiter/Hashimoto's disease: His goiter is about the same size today. The pattern of waxing and waning of thyroid gland size is c/w evolving thyroiditis. Thyroiditis is clinically quiescent 9. Depression: This problem is about the same. He is not clinically depressed today, but also can't make himself what he needs to do.  10. Noncompliance: He is doing somewhat better at times. I continue to hope that as he ages and matures, he will do a better job of taking care of his T1DM and his life.  11. Weight loss, unintentional: This problem has stabilized.      PLAN:  1. Diagnostic: Follow the DM care plan. Call in 4 weeks to discuss BGs.  2. Therapeutic: Continue Synthroid. Resume lisinopril and escitalopram. Give both correction boluses and food boluses. Continue the current basal rates:  MN: 0.750 5 AM: 0.750 8 AM: 0.800 Noon: 0.950 6 PM: 1.500 9 PM: 1.200 3. Patient education: He knows what he should do and knows how to do it. Now he needs to commit to action. 4. Follow-up: 3 months  Level of Service: This visit lasted in excess of 45 minutes. More than 50% of the visit was devoted to counseling.  Sherrlyn Hock, MD 10/20/2015 2:46 PM

## 2015-10-20 NOTE — Patient Instructions (Signed)
Follow up visit in 3 months. 

## 2015-12-02 ENCOUNTER — Telehealth: Payer: Self-pay | Admitting: "Endocrinology

## 2015-12-02 DIAGNOSIS — IMO0001 Reserved for inherently not codable concepts without codable children: Secondary | ICD-10-CM

## 2015-12-02 DIAGNOSIS — E1065 Type 1 diabetes mellitus with hyperglycemia: Principal | ICD-10-CM

## 2015-12-02 MED ORDER — GLUCOSE BLOOD VI STRP: ORAL_STRIP | Status: DC

## 2015-12-02 NOTE — Telephone Encounter (Signed)
1. Mother called. [name] will be out of [employer] Cont[employer] Next test strips.  2. I sent in an e-scrip for the test strips for a one year's supply. David StallBRENNAN,Rivaan J

## 2015-12-06 ENCOUNTER — Other Ambulatory Visit: Payer: Self-pay | Admitting: *Deleted

## 2015-12-06 DIAGNOSIS — IMO0001 Reserved for inherently not codable concepts without codable children: Secondary | ICD-10-CM

## 2015-12-06 DIAGNOSIS — E1069 Type 1 diabetes mellitus with other specified complication: Principal | ICD-10-CM

## 2015-12-06 DIAGNOSIS — E1065 Type 1 diabetes mellitus with hyperglycemia: Secondary | ICD-10-CM

## 2015-12-06 MED ORDER — GLUCOSE BLOOD VI STRP: ORAL_STRIP | Status: DC

## 2016-01-24 ENCOUNTER — Encounter: Payer: Self-pay | Admitting: "Endocrinology

## 2016-01-24 ENCOUNTER — Ambulatory Visit (INDEPENDENT_AMBULATORY_CARE_PROVIDER_SITE_OTHER): Payer: BLUE CROSS/BLUE SHIELD | Admitting: "Endocrinology

## 2016-01-24 VITALS — BP 112/77 | HR 115 | Wt 126.4 lb

## 2016-01-24 DIAGNOSIS — Z9119 Patient's noncompliance with other medical treatment and regimen: Secondary | ICD-10-CM

## 2016-01-24 DIAGNOSIS — E109 Type 1 diabetes mellitus without complications: Secondary | ICD-10-CM

## 2016-01-24 DIAGNOSIS — R000 Tachycardia, unspecified: Secondary | ICD-10-CM

## 2016-01-24 DIAGNOSIS — E1065 Type 1 diabetes mellitus with hyperglycemia: Principal | ICD-10-CM

## 2016-01-24 DIAGNOSIS — E049 Nontoxic goiter, unspecified: Secondary | ICD-10-CM

## 2016-01-24 DIAGNOSIS — Z91199 Patient's noncompliance with other medical treatment and regimen due to unspecified reason: Secondary | ICD-10-CM

## 2016-01-24 DIAGNOSIS — E1043 Type 1 diabetes mellitus with diabetic autonomic (poly)neuropathy: Secondary | ICD-10-CM | POA: Diagnosis not present

## 2016-01-24 DIAGNOSIS — E230 Hypopituitarism: Secondary | ICD-10-CM

## 2016-01-24 DIAGNOSIS — E1042 Type 1 diabetes mellitus with diabetic polyneuropathy: Secondary | ICD-10-CM

## 2016-01-24 DIAGNOSIS — IMO0001 Reserved for inherently not codable concepts without codable children: Secondary | ICD-10-CM

## 2016-01-24 DIAGNOSIS — F32A Depression, unspecified: Secondary | ICD-10-CM

## 2016-01-24 DIAGNOSIS — E063 Autoimmune thyroiditis: Secondary | ICD-10-CM

## 2016-01-24 DIAGNOSIS — E038 Other specified hypothyroidism: Secondary | ICD-10-CM

## 2016-01-24 DIAGNOSIS — F329 Major depressive disorder, single episode, unspecified: Secondary | ICD-10-CM

## 2016-01-24 DIAGNOSIS — I4711 Inappropriate sinus tachycardia, so stated: Secondary | ICD-10-CM

## 2016-01-24 DIAGNOSIS — E10649 Type 1 diabetes mellitus with hypoglycemia without coma: Secondary | ICD-10-CM | POA: Diagnosis not present

## 2016-01-24 LAB — GLUCOSE, POCT (MANUAL RESULT ENTRY): POC GLUCOSE: 117 mg/dL — AB (ref 70–99)

## 2016-01-24 LAB — POCT GLYCOSYLATED HEMOGLOBIN (HGB A1C): HEMOGLOBIN A1C: 8.6

## 2016-01-24 NOTE — Progress Notes (Signed)
Subjective:  Patient Name: Scott Moran Date of Birth: 1991-07-25  MRN: [id number]  Scott Moran  presents to the office today for follow-up of his type 1 diabetes mellitus, goiter, hypoglycemia, depression, social anxiety disorder, avoidant personality disorder, hypothyroidism, thyroiditis, diabetic autonomic neuropathy, inappropriate sinus tachycardia, non-proliferative diabetic retinopathy, diabetic peripheral neuropathy, fatigue, hypogonadotropic hypogonadism, and medical non-compliance.  HISTORY OF PRESENT ILLNESS:   Scott Moran is a 25 y.o. Caucasian young man. Scott Moran was unaccompanied.   1. I first saw the patient in consultation on 03/27/05 in the [facility] in [address], [place]. He had been referred by his primary care provider, Scott Moran, in [place], [place], for evaluation and treatment of poorly controlled type 1 diabetes. The patient was 25 years old.  A. The patient had been diagnosed with type 1 diabetes in February of 1995 at age 45. He was on an insulin pump when I first met him. His hemoglobin A1c had increased from 7.1% in December of 2005 to 8.2% in April 2006. He was having frequent snacks that he was not taking boluses for. He was also frequently noncompliant with checking blood sugars and taking meal boluses. I adjusted his basal rates.  B. At his next [name] visit on 06/19/05, he was still very noncompliant. His mental status was normal. He had a 25 gram goiter. His father was serving in [place] with the [employer]. There was a lot of stress at home. I again adjusted his basal rates.  C. At the end of July 2006 we closed our satellite office in [place]. The patient elected to be followed at our clinic here in [place]. I have followed him here ever since.  2. During the past ten years, the patient has had several clinical issues.   A. T1DM: The patient's blood glucose control has usually been fairly good for a teenager.  His maximum hemoglobin A1c occurred on 04/16/06 when the hemoglobin A1c was 12.1%. Since then the hemoglobin A1c values have varied between 6.9% and 8.6%. He is still fairly noncompliant with checking blood sugars, bolusing, and changing his insulin pump sites on time, but he is more compliant and more careful than he used to be. He has not required hospital re-admission for DKA, severe hyperglycemia, or hypoglycemia.   B. Goiter, Hashimoto's thyroiditis, and hypothyroidism: In late 2008 and early 2009 the patient developed hypothyroidism secondary to Hashimoto's disease. I started him on Synthroid, 25 mcg per day. When he takes his medication, he is euthyroid.  C. Autonomic neuropathy and inappropriate sinus tachycardia: In late 2008 he developed autonomic neuropathy which manifested as inappropriate sinus tachycardia. These problems varied in parallel with his BGs and HbA1c values.  D. Diabetic peripheral neuropathy: The patient has had symptoms and signs of mild diabetic peripheral neuropathy at several times in the past. His peripheral neuropathic signs and symptoms similarly varied in parallel with his BGs and HbA1c values.   E. Non-proliferative diabetic retinopathy: This problem was identified during a diabetic eye exam in June 2010.  F. Hypogonadotropic hypogonadism: The patient has always been somewhat lackadaisical and relatively passive. On 06/29/10 his FSH was 4.9, LH was 2.4, testosterone was 399.66, and free testosterone was 79.8. The testosterone and free testosterone were relatively low for an 25 year old young man. At the same time the [facility] and LH while "normal", appeared to be somewhat inappropriately low for the level of testosterone he had. We discussed the options for treatment including exercise and medication, to include AndroGel. The patient  chose the exercise option. By 10/19/10 the [facility] was 6.8, the LH was 3.2, total testosterone was 615.88 and the free testosterone was 131.6. It may be  that when he was more depressed he had less gonadotropic function. We will continue to follow up on this issue over time.  G. Depression/Personality disorders:    1). The patient has been diagnosed with depression at different times in the past. For some time he took Remeron (mirtazapine), but discontinued this in early 2012. I had been trying to convince him for some time that he was depressed and that he should seek out help. He eventually did decide to seek psychiatric help.    2). On 06/08/13 Scott Moran was evaluated by Scott Moran, a noted psychiatrist in [place].  Dr. [name] and his staff performed neuropsychiatric testing. On the Scott Moran had problems with non-verbal reasoning and emotional intelligence. Dr. 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 Dr. [name] prescribed escitalopram, 10 mg/day. Scott Moran was supposed to continue to see Dr. [name] in follow up. Unfortunately, Scott Moran has not followed up.    3). Scott Moran was then referred to a therapist, Scott Moran. Scott Moran enjoys working with Scott Moran and feels that he is making progress.    H. 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   3. The patient's last [facility] visit was on11/17/16. In the interim, he has been healthy. He is still having some caffeinated beverages.  He has not been as focused on taking care of his T1DM as he was a few months ago.  When he tries to do better he does so for a few days, then slides backward.   A. At his last visit we continued his basal rates. He occasionally has some low BGs in the mornings.      Scott Moran saw Scott Moran, his therapist, about two months ago. Scott Moran feels that he is making progress.   C. His sleep pattern still varies. Overall he had been sleeping better.  For the past several weeks,  however, he has had both insomnia and early awakening  D. Jobe takes his Synthroid dose of 25 mcg/day every night. He is not taking lisinopril and his escitalopram very often.   E. He now has his learner's permit to drive. This is a very big step for him. After years of avoiding driving, he is now looking forward to getting out more.   4. Pertinent Review of Systems:  Constitutional: Ethanjames feels "overall okay". He is enjoying driving. Overall his depression is better. He now leaves his house more often. He still spends many hours on the web.  Eyes: Vision is good with his glasses. There are no significant eye complaints. Last eye exam was in the late Fall. There were no signs of diabetic retinopathy. Mouth: No problems.  Neck: The patient has no complaints of anterior neck swelling, soreness, tenderness,  pressure, discomfort, or difficulty swallowing.  Heart: Heart rate sometimes feels fast. Heart rate increases with exercise or other physical activity. The patient has no complaints of palpitations, irregular heat beats, chest pain, or chest pressure. Gastrointestinal: As above. Bowel movents seem normal. The patient has no other complaints of excessive hunger, acid reflux, upset stomach, stomach aches or pains, diarrhea, or constipation. Legs: Muscle mass and strength seem normal. There are no complaints of numbness, tingling, burning,  or pain. No edema is noted. Feet: There are no obvious foot problems. There are no complaints of numbness, tingling, burning, or pain. No edema is noted. Hypoglycemia: He has had a few low BGs recently.   Emotional/psychological: The patient feels better. He is now willing to learn to drive and to use driving to expand his horizon somewhat. His inability to make himself take care of his DM self-care tasks on a regular and reliable basis is unchanged.  6. BG printout: He is changing his sites and/or his insulins about every 1-6 days. He sometimes changes the  reservoirs, but not the tubing or the sites. Sometimes he changes the tubing, but not the sites. Sometimes he changes all three.  He is doing a better job of checking BGs and taking boluses. He checks BGs 1-6 times per day, mostly 3 times per day. The longest he's gone between BG checks was 46 hours. He boluses 0-4 times per day. The longest he's gone between boluses was 48 hours. He has had 13 BGs < 80, mostly when he first awakens in the mornings or early afternoons.  In the past month his average BG was 218, compared with 211 at last visit and with 237 at the visit prior. His BG range is 51 -> 400. The  longer he leaves sites in, and the longer he goes between BG checks and insulin boluses, the higher his BGs are. The more often that he just gives manual boluses and then sleeps in, the lower the BGs are.  PAST MEDICAL, FAMILY, AND SOCIAL HISTORY:  Past Medical History  Diagnosis Date  . Type 1 diabetes mellitus not at goal [facility])   . Hypoglycemia associated with diabetes ([place])   . Goiter   . Thyroiditis, autoimmune   . Hypothyroidism, acquired, autoimmune   . Tachycardia   . Hypertension   . Hypogonadotropic hypogonadism ([place])   . Type 1 diabetes mellitus with diabetic autonomic neuropathy ([place])   . DM type 1 with diabetic peripheral neuropathy ([facility])     Family History  Problem Relation Age of Onset  . Diabetes Maternal Uncle   . Hypertension Maternal Grandmother   . Thyroid disease Neg Hx   . Obesity Neg Hx   . Kidney disease Neg Hx   . Cancer Neg Hx   . Anemia Neg Hx      Current outpatient prescriptions:  .  glucose blood (BAYER CONTOUR NEXT TEST) test strip, Check sugar 10 x daily, Disp: 300 each, Rfl: 3 .  levothyroxine (SYNTHROID, LEVOTHROID) 25 MCG tablet, Take one tablet daily. (Patient taking differently: Take 25 mcg by mouth daily. Take one tablet daily.), Disp: 90 tablet, Rfl: 3 .  NOVOLOG 100 UNIT/ML injection, USE 300 UNITS IN PUMP EVERY 48-72 HOURS AND PER PROTOCOL  FOR HYPERGLYCEMIA AND DKA OUTPATIENT TREATMENT., Disp: 120 mL, Rfl: 5 .  acetone, urine, test strip, Check ketones per protocol, Disp: 50 each, Rfl: 3 .  escitalopram (LEXAPRO) 10 MG tablet, Take 1 tablet (10 mg total) by mouth daily. (Patient not taking: Reported on 09/15/2015), Disp: 90 tablet, Rfl: 3 .  insulin aspart (NOVOLOG FLEXPEN) 100 UNIT/ML FlexPen, Use up to 50 units daily (Patient not taking: Reported on 09/15/2015), Disp: 5 pen, Rfl: 6 .  Insulin Glargine (LANTUS SOLOSTAR) 100 UNIT/ML Solostar Pen, Use up to 50 units daily (Patient not taking: Reported on 09/15/2015), Disp: 5 pen, Rfl: 6 .  ondansetron (ZOFRAN ODT) 4 MG disintegrating tablet, Take 1 tablet (4 mg total) by  mouth every 8 (eight) hours as needed for nausea or vomiting. (Patient not taking: Reported on 10/20/2015), Disp: 20 tablet, Rfl: 0 .  promethazine (PHENERGAN) 25 MG tablet, Take 1 tablet (25 mg total) by mouth every 6 (six) hours as needed for nausea or vomiting. (Patient not taking: Reported on 10/20/2015), Disp: 20 tablet, Rfl: 0  Allergies as of 01/24/2016  . (No Known Allergies)    1. Work and Family: Patient stopped attending junior college, in part due to family financial problems. He is still not working and is not interested in looking for work. 2. Activities: Few physical activities. He is a history buff and frequently participates in online chat room discussions. 3. Smoking, alcohol, or drugs: None 4. Primary Care Provider: He no longer has a primary care provider.  REVIEW OF SYSTEMS: There are no other significant problems involving Jaylyn's other body systems.   Objective:  Vital Signs:  BP 112/77 mmHg  Pulse 115  Wt 126 lb 6.4 oz (57.335 kg)    Ht Readings from Last 3 Encounters:  09/15/15 '5\' 9"'$  (1.753 m)  08/22/13 '5\' 7"'$  (1.702 m)   Wt Readings from Last 3 Encounters:  01/24/16 126 lb 6.4 oz (57.335 kg)  10/20/15 126 lb (57.153 kg)  09/15/15 126 lb (57.153 kg)   PHYSICAL  EXAM:  Constitutional: Arkel was bright and alert today, but still very slender. He is again fairly chatty. His affect is normal. He is very intelligent, and very insightful about his flaws. Unfortunately, he is not as motivated to make changes in his own behaviors as he needs to be.   Face: The face appears normal.  Eyes: There is no obvious arcus or proptosis. Moisture appears normal. Mouth: The oropharynx and tongue appear normal. Oral moisture is normal. Neck: The neck looks normal. No carotid bruits are noted. The thyroid gland is about the same at 21-22 grams in size today.  Both lobes are symmetrically enlarged. The consistency of the thyroid gland is normal today.The thyroid gland is not tender to palpation. Lungs: The lungs are clear to auscultation. Air movement is good. Heart: Heart rate remains tachycardic. Heart rhythm is regular. Heart sounds S1 and S2 are normal. I did not appreciate any pathologic cardiac murmurs. Abdomen: The abdomen is normal in size. Bowel sounds are normal. There is no obvious hepatomegaly, splenomegaly, or other mass effect.  Arms: Muscle size and bulk are normal for age. Hands: There is no obvious tremor. Phalangeal and metacarpophalangeal joints are normal. Palmar muscles are normal. Palmar skin is normal. Palmar moisture is also normal. Legs: Muscles appear normal for age. No edema is present. Feet: Feet are normally formed. Dorsalis pedal pulses are faint 1+ bilaterally.   Neurologic: Strength is normal for age in both the upper and lower extremities. Muscle tone is normal. Sensation to touch is normal in both legs, but decreased in both heels.     LAB DATA:   Results for orders placed or performed in visit on 01/24/16 (from the past 504 hour(s))  POCT Glucose (CBG)   Collection Time: 01/24/16  2:13 PM  Result Value Ref Range   POC Glucose 117 (A) 70 - 99 mg/dl  POCT HgB A1C   Collection Time: 01/24/16  2:21 PM  Result Value Ref Range   Hemoglobin  A1C 8.6    Labs 01/24/16: HbA1c 8.6%  Labs 10/20/15: HbA1c 8.1%  Labs 09/15/15: BMP normal except for glucose of 143 and calcium 8.3.  Labs 07/20/15: HbA1c 8.2%  Labs  03/25/15: HbA1c 7.8%  Labs 12/02/14: HbA1c 8.5%;  LH 4.0, FSH 8.2, testosterone 621.97; CMP normal; TSH 2.01, free T4 1.21, free T3 3.6; cholesterol 155, triglycerides 46, HDL  55, LDL 91; microalbumin/creatinine ratio 6.6  Labs 03/30/14: TSH 1.935, free T4 1.12, free T3 3.2; CMP: normal except for glucose 203; testosterone 624, FSH 6.3, LH 2.4; urine microalbumin/creatinine ratio 6.4; cholesterol 163, triglycerides 55, HDL 64, LDL 80  Labs 08/23/13: TSH 1.139; CMP normal except for a glucose of 145; CBC: WBC 5.4, Hgb 12.7, Hct 34.7%   Assessment and Plan:   ASSESSMENT:  1-2. Type 1 diabetes mellitus/hypoglycemia: Kensley is doing better at checking BGs and bolusing when he does those tasks. Unfortunately, he is still very inconsistent. This month he checked BGs at least once per day. Unfortunately , he also went for several days without bolusing. When he does bolus, the boluses are often manual guesstimates. His lowest BGs occurred when he slept in late. He knows that he should do lower TBRs at those times, but does not do so. 3. Autonomic neuropathy and inappropriate sinus tachycardia: These problems are still severe. Fortunately, these problems will reverse with better BG control.  4. Peripheral neuropathy: This problem is about the same as at last visit. s problems is also reversible with improved BG control.  5. Hypothyroid: He was euthyroid in December 2015 on his current dose of Synthroid. We need to repeat his TFTS and other surveillance labs once he has health insurance.  6. Hypogonadotropic hypogonadism: His testosterone level in December 2015 was essentially where it was in 2011. I had hoped that it would be higher when his depression improved, but he unfortunately stopped taking his escitalopram.  7. Hypertension: His  BPs are better today. He needs to take his lisinopril.   8. Goiter/Hashimoto's disease: His goiter is about the same size today. The pattern of waxing and waning of thyroid gland size is c/w evolving thyroiditis. Thyroiditis is clinically quiescent 9. Depression: This problem is better today. He is not clinically depressed today, but also can't make himself what he needs to do.  10. Noncompliance: He is doing somewhat better at times. I continue to hope that as he ages and matures, he will do a better job of taking care of his T1DM and his life.  11. Weight loss, unintentional: This problem has stabilized.      PLAN:  1. Diagnostic: HbA1c today. Annual surveillance labs. Call in 4 weeks to discuss BGs.  2. Therapeutic: Continue Synthroid. Resume lisinopril and escitalopram. Give both correction boluses and food boluses. Use temporary basal rates of 80% when he plans to sleep in. Continue the current basal rates:  MN: 0.750 5 AM: 0.750 8 AM: 0.800 Noon: 0.950 6 PM: 1.500 9 PM: 1.200 3. Patient education: He knows what he should do and knows how to do it. He still needs to commit to action. 4. Follow-up: 3 months  Level of Service: This visit lasted in excess of 45 minutes. More than 50% of the visit was devoted to counseling.  Sherrlyn Hock, MD 01/24/2016 2:24 PM

## 2016-01-24 NOTE — Patient Instructions (Signed)
Follow up visit in 3 months. 

## 2016-03-21 DIAGNOSIS — F4320 Adjustment disorder, unspecified: Secondary | ICD-10-CM | POA: Diagnosis not present

## 2016-03-23 DIAGNOSIS — E1065 Type 1 diabetes mellitus with hyperglycemia: Secondary | ICD-10-CM | POA: Diagnosis not present

## 2016-04-09 ENCOUNTER — Other Ambulatory Visit: Payer: Self-pay | Admitting: "Endocrinology

## 2016-04-09 ENCOUNTER — Ambulatory Visit (INDEPENDENT_AMBULATORY_CARE_PROVIDER_SITE_OTHER): Payer: BLUE CROSS/BLUE SHIELD | Admitting: *Deleted

## 2016-04-09 VITALS — BP 116/81 | HR 104 | Wt 133.6 lb

## 2016-04-09 DIAGNOSIS — E038 Other specified hypothyroidism: Secondary | ICD-10-CM | POA: Diagnosis not present

## 2016-04-09 DIAGNOSIS — E1065 Type 1 diabetes mellitus with hyperglycemia: Principal | ICD-10-CM

## 2016-04-09 DIAGNOSIS — E230 Hypopituitarism: Secondary | ICD-10-CM | POA: Diagnosis not present

## 2016-04-09 DIAGNOSIS — E109 Type 1 diabetes mellitus without complications: Secondary | ICD-10-CM

## 2016-04-09 DIAGNOSIS — IMO0001 Reserved for inherently not codable concepts without codable children: Secondary | ICD-10-CM

## 2016-04-09 LAB — GLUCOSE, POCT (MANUAL RESULT ENTRY): POC Glucose: 323 mg/dl — AB (ref 70–99)

## 2016-04-10 LAB — MICROALBUMIN / CREATININE URINE RATIO
Creatinine, Urine: 75 mg/dL (ref 20–370)
Microalb Creat Ratio: 4 mcg/mg creat (ref <30)
Microalb, Ur: 0.3 mg/dL

## 2016-04-10 LAB — COMPREHENSIVE METABOLIC PANEL
ALT: 24 U/L (ref 9–46)
AST: 17 U/L (ref 10–40)
Albumin: 5.2 g/dL — ABNORMAL HIGH (ref 3.6–5.1)
Alkaline Phosphatase: 63 U/L (ref 40–115)
BUN: 11 mg/dL (ref 7–25)
CHLORIDE: 100 mmol/L (ref 98–110)
CO2: 29 mmol/L (ref 20–31)
Calcium: 10.3 mg/dL (ref 8.6–10.3)
Creat: 0.88 mg/dL (ref 0.60–1.35)
GLUCOSE: 224 mg/dL — AB (ref 70–99)
POTASSIUM: 4 mmol/L (ref 3.5–5.3)
Sodium: 140 mmol/L (ref 135–146)
Total Bilirubin: 0.9 mg/dL (ref 0.2–1.2)
Total Protein: 8.2 g/dL — ABNORMAL HIGH (ref 6.1–8.1)

## 2016-04-10 LAB — TESTOSTERONE, FREE AND TOTAL (INCLUDES SHBG)-(MALES)
Sex Hormone Binding: 46 nmol/L (ref 10–50)
TESTOSTERONE FREE: 85.5 pg/mL (ref 47.0–244.0)
TESTOSTERONE-% FREE: 1.7 % (ref 1.6–2.9)
TESTOSTERONE: 504 ng/dL (ref 250–827)

## 2016-04-10 LAB — LIPID PANEL
CHOL/HDL RATIO: 2.5 ratio (ref <=5.0)
Cholesterol: 176 mg/dL (ref 125–200)
HDL: 70 mg/dL (ref >=40)
LDL CALC: 88 mg/dL (ref <130)
Triglycerides: 92 mg/dL (ref <150)
VLDL: 18 mg/dL (ref <30)

## 2016-04-10 LAB — TSH: TSH: 1.75 m[IU]/L (ref 0.40–4.50)

## 2016-04-10 LAB — T3, FREE: T3 FREE: 3.2 pg/mL (ref 2.3–4.2)

## 2016-04-10 LAB — LUTEINIZING HORMONE: LH: 3.4 m[IU]/mL (ref 1.5–9.3)

## 2016-04-10 LAB — FOLLICLE STIMULATING HORMONE: FSH: 6 m[IU]/mL (ref 1.6–8.0)

## 2016-04-10 LAB — T4, FREE: FREE T4: 1.2 ng/dL (ref 0.8–1.8)

## 2016-04-10 NOTE — Progress Notes (Signed)
Enlite start and insulin pump settings transferred from old pump to the new 630G   Scott Moran was here with his mother for the transfer of his pump settings to his new insulin pump and also to start on the new Enlite sensor. Neither Harshith nor his mother have any questions at this time regarding his new insulin pump. Paired his new glucose meter and his Enlite transmitter to the Medtronic 630G insulin pump. Reviewed the buttons and identified icons on his insulin pump.  Started Enlite sensor  Sensor settings High Settings              ON 12a-12a  350 Low settings                On  12a-12a                       90 Alert before low           On Suspend on Low         On Alert on low                 On Snooze                       10 mins Alert Silence               Off         Sensor settings High Settings              ON 12a-12a  350 Low settings                On  12a-12a                       90 Alert before low           On Suspend on Low         On Alert on low                 On Snooze                       10 mins Alert Silence               Off        Auto Suspense On  Review indications for use, contraindications, warnings and precautions of Enlite CGM. Advised parent and patient that CGM is an addition to the Glucose Meter check, that they are not to use the readings of the CGM to dose insulin at any time; the sensor is to be used to help them monitor the blood sugars. The sensor and the transmitter are waterproof, he can shower bath and swim with sensor. Please remove the CGM sensor before any X-ray or CT scan or MRI procedures.   Demonstrated and showed parent and patient how to apply new sensor on skin. Once patient felt ready he proceeded to start new sensor. Cleaned back of the R upper quadrant, applied sensor added Skin Tac adhesive to area, inserted transmitter, checked for communication light on transmitter then added Tegaderm tape.   Advised and reminded patient of  importance of calibration, he can do calibration in two hours and then again in six hours. Sensor is able to function with two calibrations a day, but if it is calibrated 3-4 times a day the accuracy will be better, reminded of no calibration  when BG's are moving up or down too fast it will not be an accurate calibration.   Then transferred insulin pump settings from old Medtronic 530G to new 630G insulin pump Settings: Basal rates Time U/Hr 12a 0.750 5a 0.750 8a 0.800 12p 0.950 6p 1.50 9p 1.20 Total Basal 23 units  Carb Ratios Time  Ratio 12a 12 11:30 12 4:30p 15  Insulin sensitivity  12a-12a 65  Blood Glucose Target Time Target 12a 150 6a 110 10p 150   Active insulin time 3 hours Maximum Basal Rate  2.0 units Maximum Bolus  12.0 Units Dual/Square wave  On Bolus Wizard   On Reservoir Alert  20 units Change Reservoir  Q-3 days  Patient ready to use new insulin pump. Filled new reservoir with 200 units of insulin. Filled tubing and ready to insert new infusion set in skin. Cleaned skin using alcohol swab and inserted set. Patient tolerated very well the procedure. Patient and parent are fast learners did not have any questions or concerns with the new insulin pump.   Assessment: Both patient and parent participated in hands on training using his pump and demo pump. Parent and patient verbalized understanding information given and asked appropriate questions.  Plan: Reminded of sensor Calibration in two hours and then aging in 6 hours, and before each meal. Reminded to call [employer]'s to re-set password for [facility] for pump download. Call our office if any questions or concerns regarding pump or diabetes.

## 2016-04-11 DIAGNOSIS — F4320 Adjustment disorder, unspecified: Secondary | ICD-10-CM | POA: Diagnosis not present

## 2016-04-25 ENCOUNTER — Encounter: Payer: Self-pay | Admitting: "Endocrinology

## 2016-04-25 ENCOUNTER — Ambulatory Visit (INDEPENDENT_AMBULATORY_CARE_PROVIDER_SITE_OTHER): Payer: BLUE CROSS/BLUE SHIELD | Admitting: "Endocrinology

## 2016-04-25 VITALS — BP 132/85 | HR 114 | Wt 132.6 lb

## 2016-04-25 DIAGNOSIS — IMO0001 Reserved for inherently not codable concepts without codable children: Secondary | ICD-10-CM

## 2016-04-25 DIAGNOSIS — E10649 Type 1 diabetes mellitus with hypoglycemia without coma: Secondary | ICD-10-CM

## 2016-04-25 DIAGNOSIS — E1065 Type 1 diabetes mellitus with hyperglycemia: Principal | ICD-10-CM

## 2016-04-25 DIAGNOSIS — E038 Other specified hypothyroidism: Secondary | ICD-10-CM

## 2016-04-25 DIAGNOSIS — R000 Tachycardia, unspecified: Secondary | ICD-10-CM

## 2016-04-25 DIAGNOSIS — E049 Nontoxic goiter, unspecified: Secondary | ICD-10-CM

## 2016-04-25 DIAGNOSIS — F329 Major depressive disorder, single episode, unspecified: Secondary | ICD-10-CM

## 2016-04-25 DIAGNOSIS — Z91199 Patient's noncompliance with other medical treatment and regimen due to unspecified reason: Secondary | ICD-10-CM

## 2016-04-25 DIAGNOSIS — Z9119 Patient's noncompliance with other medical treatment and regimen: Secondary | ICD-10-CM

## 2016-04-25 DIAGNOSIS — E109 Type 1 diabetes mellitus without complications: Secondary | ICD-10-CM | POA: Diagnosis not present

## 2016-04-25 DIAGNOSIS — E063 Autoimmune thyroiditis: Secondary | ICD-10-CM

## 2016-04-25 DIAGNOSIS — E1042 Type 1 diabetes mellitus with diabetic polyneuropathy: Secondary | ICD-10-CM

## 2016-04-25 DIAGNOSIS — I4711 Inappropriate sinus tachycardia, so stated: Secondary | ICD-10-CM

## 2016-04-25 DIAGNOSIS — E1043 Type 1 diabetes mellitus with diabetic autonomic (poly)neuropathy: Secondary | ICD-10-CM

## 2016-04-25 DIAGNOSIS — F32A Depression, unspecified: Secondary | ICD-10-CM

## 2016-04-25 LAB — GLUCOSE, POCT (MANUAL RESULT ENTRY): POC Glucose: 227 mg/dl — AB (ref 70–99)

## 2016-04-25 LAB — POCT GLYCOSYLATED HEMOGLOBIN (HGB A1C): HEMOGLOBIN A1C: 7.7

## 2016-04-25 NOTE — Patient Instructions (Signed)
Follow up visit in 3 months. Call in 4 weeks to discuss BGs.   

## 2016-04-25 NOTE — Progress Notes (Signed)
Subjective:  Patient Name: Scott Moran Date of Birth: 1991-07-25  MRN: [id number]  Scott Moran  presents to the office today for follow-up of his type 1 diabetes mellitus, goiter, hypoglycemia, depression, social anxiety disorder, avoidant personality disorder, hypothyroidism, thyroiditis, diabetic autonomic neuropathy, inappropriate sinus tachycardia, non-proliferative diabetic retinopathy, diabetic peripheral neuropathy, fatigue, hypogonadotropic hypogonadism, and medical non-compliance.  HISTORY OF PRESENT ILLNESS:   Scott Moran is a 25 y.o. Caucasian young man. Scott Moran was unaccompanied.   1. I first saw the patient in consultation on 03/27/05 in the [facility] in [address], [place]. He had been referred by his primary care provider, Dr. Wendie Moran, in [place], [place], for evaluation and treatment of poorly controlled type 1 diabetes. The patient was 25 years old.  A. The patient had been diagnosed with type 1 diabetes in February of 1995 at age 45. He was on an insulin pump when I first met him. His hemoglobin A1c had increased from 7.1% in December of 2005 to 8.2% in April 2006. He was having frequent snacks that he was not taking boluses for. He was also frequently noncompliant with checking blood sugars and taking meal boluses. I adjusted his basal rates.  B. At his next [place] visit on 06/19/05, he was still very noncompliant. His mental status was normal. He had a 25 gram goiter. His father was serving in [place] with the [employer]. There was a lot of stress at home. I again adjusted his basal rates.  C. At the end of July 2006 we closed our satellite office in [place]. The patient elected to be followed at our clinic here in [place]. I have followed him here ever since.  2. During the past ten years, the patient has had several clinical issues.   A. T1DM: The patient's blood glucose control has usually been fairly good for a teenager.  His maximum hemoglobin A1c occurred on 04/16/06 when the hemoglobin A1c was 12.1%. Since then the hemoglobin A1c values have varied between 6.9% and 8.6%. He is still fairly noncompliant with checking blood sugars, bolusing, and changing his insulin pump sites on time, but he is more compliant and more careful than he used to be. He has not required hospital re-admission for DKA, severe hyperglycemia, or hypoglycemia.   B. Goiter, Hashimoto's thyroiditis, and hypothyroidism: In late 2008 and early 2009 the patient developed hypothyroidism secondary to Hashimoto's disease. I started him on Synthroid, 25 mcg per day. When he takes his medication, he is euthyroid.  C. Autonomic neuropathy and inappropriate sinus tachycardia: In late 2008 he developed autonomic neuropathy which manifested as inappropriate sinus tachycardia. These problems varied in parallel with his BGs and HbA1c values.  D. Diabetic peripheral neuropathy: The patient has had symptoms and signs of mild diabetic peripheral neuropathy at several times in the past. His peripheral neuropathic signs and symptoms similarly varied in parallel with his BGs and HbA1c values.   E. Non-proliferative diabetic retinopathy: This problem was identified during a diabetic eye exam in June 2010.  F. Hypogonadotropic hypogonadism: The patient has always been somewhat lackadaisical and relatively passive. On 06/29/10 his FSH was 4.9, LH was 2.4, testosterone was 399.66, and free testosterone was 79.8. The testosterone and free testosterone were relatively low for an 25 year old young man. At the same time the [facility] and LH while "normal", appeared to be somewhat inappropriately low for the level of testosterone he had. We discussed the options for treatment including exercise and medication, to include AndroGel. The patient  chose the exercise option. By 10/19/10 the [facility] was 6.8, the LH was 3.2, total testosterone was 615.88 and the free testosterone was 131.6. It may be  that when he was more depressed he had less gonadotropic function. We will continue to follow up on this issue over time.  G. Depression/Personality disorders:    1). The patient has been diagnosed with depression at different times in the past. For some time he took Remeron (mirtazapine), but discontinued this in early 2012. I had been trying to convince him for some time that he was depressed and that he should seek out help. He eventually did decide to seek psychiatric help.    2). On 06/08/13 Larz was evaluated by [name], a noted psychiatrist in [place].  [name] and his staff performed neuropsychiatric testing. On the IANN RODIER had problems with non-verbal reasoning and emotional intelligence. 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 [name] prescribed escitalopram, 10 mg/day. [name] was supposed to continue to see [name] in follow up. Unfortunately, Bo did not followed up.    3). [name] was then referred to a therapist, [name]. Jaking enjoys working with Dr. [name] and feels that he is making progress.    H. 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   3. The patient's last [facility] visit was on 01/24/16. In the interim, he has been healthy.   A. On 04/09/16 Scott Moran transitioned to the new Medtronic 630G pump and Enlite sensor.  He has had two problems with his Enlite sensor insertions. His 630G pump is working well. He likes his sensor-pump combination when it works well. The sensor-pump combination significantly reduces his personal stress levels. He feels much more confident in his DM care when he is on the sensor-pump combination.  Scott Moran saw [name], his therapist, about three weeks ago. Scott Moran feels that he is making progress.   C. His sleep  pattern still varies. Overall he has been sleeping better, in part because he feels safer with his sensor-pump combination.    Juanda Bond takes his Synthroid dose of 25 mcg/day every night. He is not taking lisinopril and his escitalopram very often.   E. He now has his learner's permit and is learning to drive. After years of avoiding driving, he is now looking forward to getting out more.   4. Pertinent Review of Systems:  Constitutional: Dantrell feels "a little warm". He is enjoying driving. Overall his depression is better with his new sensor-pump combination. He now leaves his house a bit more often. He still spends many hours on the web.  Eyes: Vision is good with his glasses. There are no significant eye complaints. Last eye exam was in the late Fall 2016. There were no signs of diabetic retinopathy. Mouth: No problems.  Neck: The patient has no complaints of anterior neck swelling, soreness, tenderness,  pressure, discomfort, or difficulty swallowing.  Heart: Heart rate increases with exercise or other physical activity. The patient has no complaints of palpitations, irregular heat beats, chest pain, or chest pressure. Gastrointestinal: Bowel movents seem normal. The patient has no other complaints of excessive hunger, acid reflux, upset stomach, stomach aches or pains, diarrhea, or constipation. Legs: Muscle mass and strength seem normal. There are no complaints of numbness, tingling, burning, or pain. No edema is noted. Feet:  There are no obvious foot problems. There are no complaints of numbness, tingling, burning, or pain. No edema is noted. Hypoglycemia: He has had a few low BGs recently.   Emotional/psychological: The patient feels better. He is now willing to learn to drive and to use driving to expand his horizon somewhat. His inability to make himself take care of his DM self-care tasks on a regular and reliable basis has improved with the new sensor-pump combination. He is still  unwilling to take his lisinopril and escitalopram. 6. BG printout: He is changing his sites and/or his insulins about every 5-7 days. He sometimes changes the reservoirs, but not the tubing or the sites. Sometimes he changes the tubing, but not the sites. Sometimes he changes all three.  He is doing a better job of checking BGs and taking boluses. He checks BGs 3-6 times per day, mostly 4-10 times per day. The longest he's gone between BG checks was 12 hours. He boluses 1-5 times per day. The longest he's gone between boluses was 20 hours. His average BG is 197, range 74-394, compared with 51 -> 400 at his last visit. The  longer he leaves sites in, and the longer he goes between BG checks and insulin boluses, the more variability in his BGs.  7. Sensor printout: His lowest average BGs occur between 8-10 AM. His highest average BGs occur about 3 PM. He had 8 BGs <180. His skin glucose values and BG values correlate fairly well. He has had 13 threshold suspends.   PAST MEDICAL, FAMILY, AND SOCIAL HISTORY:  Past Medical History  Diagnosis Date  . Type 1 diabetes mellitus not at goal [facility])   . Hypoglycemia associated with diabetes ([place])   . Goiter   . Thyroiditis, autoimmune   . Hypothyroidism, acquired, autoimmune   . Tachycardia   . Hypertension   . Hypogonadotropic hypogonadism ([place])   . Type 1 diabetes mellitus with diabetic autonomic neuropathy ([place])   . DM type 1 with diabetic peripheral neuropathy ([facility])     Family History  Problem Relation Age of Onset  . Diabetes Maternal Uncle   . Hypertension Maternal Grandmother   . Thyroid disease Neg Hx   . Obesity Neg Hx   . Kidney disease Neg Hx   . Cancer Neg Hx   . Anemia Neg Hx      Current outpatient prescriptions:  .  acetone, urine, test strip, Check ketones per protocol, Disp: 50 each, Rfl: 3 .  escitalopram (LEXAPRO) 10 MG tablet, Take 1 tablet (10 mg total) by mouth daily. (Patient not taking: Reported on 09/15/2015), Disp: 90  tablet, Rfl: 3 .  glucose blood (BAYER CONTOUR NEXT TEST) test strip, Check sugar 10 x daily, Disp: 300 each, Rfl: 3 .  insulin aspart (NOVOLOG FLEXPEN) 100 UNIT/ML FlexPen, Use up to 50 units daily (Patient not taking: Reported on 09/15/2015), Disp: 5 pen, Rfl: 6 .  Insulin Glargine (LANTUS SOLOSTAR) 100 UNIT/ML Solostar Pen, Use up to 50 units daily (Patient not taking: Reported on 09/15/2015), Disp: 5 pen, Rfl: 6 .  levothyroxine (SYNTHROID) 25 MCG tablet, Take 25 mcg by mouth daily before breakfast., Disp: , Rfl:  .  NOVOLOG 100 UNIT/ML injection, USE 300 UNITS IN PUMP EVERY 48-72 HOURS AND PER PROTOCOL FOR HYPERGLYCEMIA AND DKA OUTPATIENT TREATMENT., Disp: 120 mL, Rfl: 5 .  ondansetron (ZOFRAN ODT) 4 MG disintegrating tablet, Take 1 tablet (4 mg total) by mouth every 8 (eight) hours as needed for nausea or vomiting. (  Patient not taking: Reported on 10/20/2015), Disp: 20 tablet, Rfl: 0 .  promethazine (PHENERGAN) 25 MG tablet, Take 1 tablet (25 mg total) by mouth every 6 (six) hours as needed for nausea or vomiting. (Patient not taking: Reported on 10/20/2015), Disp: 20 tablet, Rfl: 0  Allergies as of 04/25/2016  . (No Known Allergies)    1. Work and Family: Patient stopped attending junior college, in part due to family financial problems. He is still not working and is not interested in looking for work. 2. Activities: Few physical activities. He is a history buff and frequently participates in online chat room discussions. 3. Smoking, alcohol, or drugs: None 4. Primary Care Provider: He no longer has a primary care provider.  REVIEW OF SYSTEMS: There are no other significant problems involving [place]'s other body systems.   Objective:  Vital Signs:  BP 132/85 mmHg  Pulse 114  Wt 132 lb 9.6 oz (60.147 kg)    Ht Readings from Last 3 Encounters:  09/15/15 '5\' 9"'$  (1.753 m)  08/22/13 '5\' 7"'$  (1.702 m)   Wt Readings from Last 3 Encounters:  04/25/16 132 lb 9.6 oz (60.147 kg)  04/09/16  133 lb 9.6 oz (60.601 kg)  01/24/16 126 lb 6.4 oz (57.335 kg)   PHYSICAL EXAM:  Constitutional: Demond was bright and alert today, but still slender. He has regained 6 pounds. He is not unusually chatty today. His affect is normal. He is very intelligent, and very insightful about his flaws. Based upon his first two weeks experience with the new Medtronic sensor-pump combination, he is more motivated to take better care of his T1DM because he gets the immediate feedback from the sensor.    Face: The face appears normal.  Eyes: There is no obvious arcus or proptosis. Moisture appears normal. Mouth: The oropharynx and tongue appear normal. Oral moisture is normal. Neck: The neck looks normal. No carotid bruits are noted. The thyroid gland is about the same at 21-22 grams in size today.  Both lobes are symmetrically enlarged. The consistency of the thyroid gland is normal today.The thyroid gland is not tender to palpation. Lungs: The lungs are clear to auscultation. Air movement is good. Heart: Heart rate remains tachycardic. Heart rhythm is regular. Heart sounds S1 and S2 are normal. I did not appreciate any pathologic cardiac murmurs. Abdomen: The abdomen is normal in size. Bowel sounds are normal. There is no obvious hepatomegaly, splenomegaly, or other mass effect.  Arms: Muscle size and bulk are normal for age. Hands: There is no obvious tremor. Phalangeal and metacarpophalangeal joints are normal. Palmar muscles are normal. Palmar skin is normal. Palmar moisture is also normal. Legs: Muscles appear normal for age. No edema is present. Feet: Feet are normally formed. Dorsalis pedal pulses are 1+ bilaterally.   Neurologic: Strength is normal for age in both the upper and lower extremities. Muscle tone is normal. Sensation to touch is normal in both legs, but decreased in the right heel.     LAB DATA:   Results for orders placed or performed in visit on 04/25/16 (from the past 504 hour(s))   POCT Glucose (CBG)   Collection Time: 04/25/16  2:11 PM  Result Value Ref Range   POC Glucose 227 (A) 70 - 99 mg/dl  Results for orders placed or performed in visit on 04/09/16 (from the past 504 hour(s))  POCT Glucose (CBG)   Collection Time: 04/09/16  1:55 PM  Result Value Ref Range   POC Glucose 323 (A) 70 -  99 mg/dl   Labs 04/25/16: HbA1c 7.7%  Labs 01/24/16: HbA1c 8.6%  Labs 10/20/15: HbA1c 8.1%  Labs 09/15/15: BMP normal except for glucose of 143 and calcium 8.3.  Labs 07/20/15: HbA1c 8.2%  Labs 03/25/15: HbA1c 7.8%  Labs 12/02/14: HbA1c 8.5%;  LH 4.0, FSH 8.2, testosterone 621.97; CMP normal; TSH 2.01, free T4 1.21, free T3 3.6; cholesterol 155, triglycerides 46, HDL  55, LDL 91; microalbumin/creatinine ratio 6.6  Labs 03/30/14: TSH 1.935, free T4 1.12, free T3 3.2; CMP: normal except for glucose 203; testosterone 624, FSH 6.3, LH 2.4; urine microalbumin/creatinine ratio 6.4; cholesterol 163, triglycerides 55, HDL 64, LDL 80  Labs 08/23/13: TSH 1.139; CMP normal except for a glucose of 145; CBC: WBC 5.4, Hgb 12.7, Hct 34.7%   Assessment and Plan:   ASSESSMENT:  1-2. Type 1 diabetes mellitus/hypoglycemia:   A. Jathan is doing better at checking BGs and bolusing since starting his new sensor-pump combination. He can now see in real time the effects of his DM management decisions. Unfortunately, however, the improvement in A1c comes at the cost of having more low BGs.   B. He has had 13 threshold suspends. His BGs are more likely to drop if he has not been eating and has not been paying attention to his sensor for a period of time.  3. Autonomic neuropathy and inappropriate sinus tachycardia: These problems are still severe. Fortunately, these problems will reverse with better BG control.   4. Peripheral neuropathy: This problem is a bit better at this visit, paralleling his improved BG control.  5. Hypothyroid: He was euthyroid in December 2015 on his current dose of Synthroid.  We need to repeat his TFTs and other surveillance labs. He says that he had his TFTs done two weeks ago, but there is no record of those labs in EPIC..  6. Hypogonadotropic hypogonadism: His testosterone level in December 2015 was essentially where it was in 2011. I had hoped that it would be higher when his depression improved, but he unfortunately stopped taking his escitalopram.  7. Hypertension: His BPs are worse. He needs to take his lisinopril.   8. Goiter/Hashimoto's disease: His goiter is about the same size today. The pattern of waxing and waning of thyroid gland size is c/w evolving thyroiditis. Thyroiditis is clinically quiescent 9. Depression: This problem is better today. He is not clinically depressed today, but still can't make himself what he needs to do.  10. Noncompliance: He is doing better since starting the new sensor-pump combination. I continue to hope that as he ages and matures, he will do a better job of taking care of his T1DM and his life.  11. Weight loss, unintentional: This problem has resolved.       PLAN:  1. Diagnostic: HbA1c today. Review results of his annual surveillance labs. Call in 4 weeks to discuss BGs.  2. Therapeutic: Continue Synthroid. Resume lisinopril and escitalopram. Give both correction boluses and food boluses. Use temporary basal rates of 80% when he plans to sleep in. Continue the current basal rates:  MN: 0.750 5 AM: 0.750 8 AM: 0.800 Noon: 0.950 6 PM: 1.500 9 PM: 1.200 3. Patient education: He knows what he should do and knows how to do it. He still needs to commit to action. 4. Follow-up: 3 months  Level of Service: This visit lasted in excess of 45 minutes. More than 50% of the visit was devoted to counseling.  Sherrlyn Hock, MD 04/25/2016 2:18 PM

## 2016-05-02 DIAGNOSIS — F4320 Adjustment disorder, unspecified: Secondary | ICD-10-CM | POA: Diagnosis not present

## 2016-05-07 ENCOUNTER — Encounter: Payer: Self-pay | Admitting: *Deleted

## 2016-05-10 ENCOUNTER — Telehealth: Payer: Self-pay | Admitting: "Endocrinology

## 2016-05-11 ENCOUNTER — Other Ambulatory Visit: Payer: Self-pay | Admitting: *Deleted

## 2016-05-11 DIAGNOSIS — IMO0001 Reserved for inherently not codable concepts without codable children: Secondary | ICD-10-CM

## 2016-05-11 DIAGNOSIS — E038 Other specified hypothyroidism: Secondary | ICD-10-CM

## 2016-05-11 DIAGNOSIS — E1065 Type 1 diabetes mellitus with hyperglycemia: Principal | ICD-10-CM

## 2016-05-11 MED ORDER — LEVOTHYROXINE SODIUM 25 MCG PO TABS: 25.0000 ug | ORAL_TABLET | Freq: Every day | ORAL | Status: DC

## 2016-05-11 MED ORDER — GLUCAGON (RDNA) 1 MG IJ KIT: 1.0000 mg | PACK | Freq: Once | INTRAMUSCULAR | Status: DC | PRN

## 2016-05-11 MED ORDER — INSULIN ASPART 100 UNIT/ML ~~LOC~~ SOLN: SUBCUTANEOUS | Status: DC

## 2016-05-11 NOTE — Telephone Encounter (Signed)
Done, sent prescriptions as requested.

## 2016-05-23 DIAGNOSIS — F4320 Adjustment disorder, unspecified: Secondary | ICD-10-CM | POA: Diagnosis not present

## 2016-05-24 DIAGNOSIS — Z794 Long term (current) use of insulin: Secondary | ICD-10-CM | POA: Diagnosis not present

## 2016-05-24 DIAGNOSIS — E109 Type 1 diabetes mellitus without complications: Secondary | ICD-10-CM | POA: Diagnosis not present

## 2016-05-24 DIAGNOSIS — E108 Type 1 diabetes mellitus with unspecified complications: Secondary | ICD-10-CM | POA: Diagnosis not present

## 2016-05-28 ENCOUNTER — Encounter: Payer: Self-pay | Admitting: *Deleted

## 2016-05-28 ENCOUNTER — Ambulatory Visit (INDEPENDENT_AMBULATORY_CARE_PROVIDER_SITE_OTHER): Payer: BLUE CROSS/BLUE SHIELD | Admitting: *Deleted

## 2016-05-28 VITALS — BP 116/79 | HR 119 | Wt 133.4 lb

## 2016-05-28 DIAGNOSIS — E109 Type 1 diabetes mellitus without complications: Secondary | ICD-10-CM | POA: Diagnosis not present

## 2016-05-28 DIAGNOSIS — IMO0001 Reserved for inherently not codable concepts without codable children: Secondary | ICD-10-CM

## 2016-05-28 DIAGNOSIS — E1065 Type 1 diabetes mellitus with hyperglycemia: Principal | ICD-10-CM

## 2016-05-28 LAB — GLUCOSE, POCT (MANUAL RESULT ENTRY): POC Glucose: 102 mg/dl — AB (ref 70–99)

## 2016-05-30 NOTE — Progress Notes (Signed)
Enlite sensor follow up  [name] was here with his mom for a follow up trouble he has been having with his Enlite sensor. States that first sensor he worked worked really great, but the second and the third came out and was having trouble with them, he called Medtr[employer]'s to trouble shoot it and was ok. Feels that need to make sure he is doing it correct.  Basically he is doing everything good the only thing I was able to see that when he pressed on the insertion of the sensor he was pressing too much on the skin.  Was able to start sensor himself with no help, insulin pump read sensor and read as warming up. Advised that he can calibrate before bedtime so that the pump does not wake him in the midldle of the night for a Bg calibration.   Assessment: Patient is doing good with insulin pump. Wanted reassurance on how to insert the Enlite sensor.  Patient and parent participated in hands on training and verbalized understanding information given.   Plan: Continue to check blood sugars as instructed by  Provider. Call our office or [employer] if questions regarding your insulin pump or sensor.

## 2016-06-13 DIAGNOSIS — F4320 Adjustment disorder, unspecified: Secondary | ICD-10-CM | POA: Diagnosis not present

## 2016-07-04 DIAGNOSIS — F4320 Adjustment disorder, unspecified: Secondary | ICD-10-CM | POA: Diagnosis not present

## 2016-07-10 ENCOUNTER — Encounter: Payer: Self-pay | Admitting: *Deleted

## 2016-07-10 ENCOUNTER — Ambulatory Visit (INDEPENDENT_AMBULATORY_CARE_PROVIDER_SITE_OTHER): Payer: BLUE CROSS/BLUE SHIELD | Admitting: *Deleted

## 2016-07-10 VITALS — BP 121/79 | HR 101 | Wt 136.0 lb

## 2016-07-10 DIAGNOSIS — E1065 Type 1 diabetes mellitus with hyperglycemia: Principal | ICD-10-CM

## 2016-07-10 DIAGNOSIS — E109 Type 1 diabetes mellitus without complications: Secondary | ICD-10-CM | POA: Diagnosis not present

## 2016-07-10 DIAGNOSIS — IMO0001 Reserved for inherently not codable concepts without codable children: Secondary | ICD-10-CM

## 2016-07-10 LAB — GLUCOSE, POCT (MANUAL RESULT ENTRY): POC GLUCOSE: 216 mg/dL — AB (ref 70–99)

## 2016-07-10 NOTE — Progress Notes (Signed)
Insulin pump settings transfer from 630 to 670G  [name] was here with his mom and Kathi SimpersLou Anna, RN, CDE from [facility] to transfer pump settings to hie new 670G insulin pump. He has been using the 630G insulin pump since May of this year. His mom is more excited to start him on the new insulin pump, because he has enrolled in school as well and will start college next week. Transferred insulin pump settings to new pump as shown below:  Pump settings  Basal Rates Time  U/Hr 12a-5a  0.750 5a-8a  0.750 8a-12p  0.800 12p-6p  0.950 6p-9p  1.500 9p-12a  1.200 Total Insulin 23.00 units  Carbohydrate Ratio Time  Ratio 12-8a  15 8a-4:30p 12 4:30p-12a 15  Insulin Sensitivity 12a-12a 65  Blood Glucose Target Time  Target 12a-8a  150 8a-10p  110 10p-12a 150  Active insulin time  3  Bolus wizard  On Max Basal rate 2.00 U/hr Max Blus  12 units  Since [name] has not received his new Guardian sensors, we were not able to start on the sensor today. Mom will call our office to schedule sensor start once they receive them.  [name] is a [employer] learner, he was able to add his basal rates and make adjustments to his new pump with no problems.

## 2016-07-16 ENCOUNTER — Other Ambulatory Visit: Payer: BLUE CROSS/BLUE SHIELD | Admitting: *Deleted

## 2016-07-16 DIAGNOSIS — E108 Type 1 diabetes mellitus with unspecified complications: Secondary | ICD-10-CM | POA: Diagnosis not present

## 2016-07-16 DIAGNOSIS — Z794 Long term (current) use of insulin: Secondary | ICD-10-CM | POA: Diagnosis not present

## 2016-07-26 ENCOUNTER — Ambulatory Visit (INDEPENDENT_AMBULATORY_CARE_PROVIDER_SITE_OTHER): Payer: BLUE CROSS/BLUE SHIELD | Admitting: "Endocrinology

## 2016-07-26 ENCOUNTER — Encounter: Payer: Self-pay | Admitting: "Endocrinology

## 2016-07-26 VITALS — BP 122/82 | HR 96 | Wt 138.8 lb

## 2016-07-26 DIAGNOSIS — F32A Depression, unspecified: Secondary | ICD-10-CM

## 2016-07-26 DIAGNOSIS — E10649 Type 1 diabetes mellitus with hypoglycemia without coma: Secondary | ICD-10-CM | POA: Diagnosis not present

## 2016-07-26 DIAGNOSIS — F329 Major depressive disorder, single episode, unspecified: Secondary | ICD-10-CM

## 2016-07-26 DIAGNOSIS — E063 Autoimmune thyroiditis: Secondary | ICD-10-CM

## 2016-07-26 DIAGNOSIS — E049 Nontoxic goiter, unspecified: Secondary | ICD-10-CM

## 2016-07-26 DIAGNOSIS — IMO0001 Reserved for inherently not codable concepts without codable children: Secondary | ICD-10-CM

## 2016-07-26 DIAGNOSIS — I10 Essential (primary) hypertension: Secondary | ICD-10-CM | POA: Diagnosis not present

## 2016-07-26 DIAGNOSIS — E1065 Type 1 diabetes mellitus with hyperglycemia: Principal | ICD-10-CM

## 2016-07-26 DIAGNOSIS — Z91199 Patient's noncompliance with other medical treatment and regimen due to unspecified reason: Secondary | ICD-10-CM

## 2016-07-26 DIAGNOSIS — R000 Tachycardia, unspecified: Secondary | ICD-10-CM

## 2016-07-26 DIAGNOSIS — E1042 Type 1 diabetes mellitus with diabetic polyneuropathy: Secondary | ICD-10-CM

## 2016-07-26 DIAGNOSIS — E1043 Type 1 diabetes mellitus with diabetic autonomic (poly)neuropathy: Secondary | ICD-10-CM | POA: Diagnosis not present

## 2016-07-26 DIAGNOSIS — E230 Hypopituitarism: Secondary | ICD-10-CM

## 2016-07-26 DIAGNOSIS — E109 Type 1 diabetes mellitus without complications: Secondary | ICD-10-CM | POA: Diagnosis not present

## 2016-07-26 DIAGNOSIS — E291 Testicular hypofunction: Secondary | ICD-10-CM

## 2016-07-26 DIAGNOSIS — E038 Other specified hypothyroidism: Secondary | ICD-10-CM

## 2016-07-26 DIAGNOSIS — Z9119 Patient's noncompliance with other medical treatment and regimen: Secondary | ICD-10-CM

## 2016-07-26 LAB — POCT GLYCOSYLATED HEMOGLOBIN (HGB A1C): HEMOGLOBIN A1C: 7.8

## 2016-07-26 LAB — GLUCOSE, POCT (MANUAL RESULT ENTRY): POC GLUCOSE: 277 mg/dL — AB (ref 70–99)

## 2016-07-26 NOTE — Progress Notes (Signed)
Subjective:  Patient Name: Scott Moran Date of Birth: 07-23-1991  MRN: [id number]  Scott Moran  presents to the office today for follow-up of his type 1 diabetes mellitus, goiter, hypoglycemia, depression, social anxiety disorder, avoidant personality disorder, hypothyroidism, thyroiditis, diabetic autonomic neuropathy, inappropriate sinus tachycardia, non-proliferative diabetic retinopathy, diabetic peripheral neuropathy, fatigue, hypogonadotropic hypogonadism, and medical non-compliance.  HISTORY OF PRESENT ILLNESS:   Scott Moran is a 25 y.o. Caucasian young man. Scott Moran was unaccompanied.   1. I first saw the patient in consultation on 03/27/05 in the [facility] in [place], [place]. He had been referred by his primary care provider, [name], in [place], [place], for evaluation and treatment of poorly controlled type 1 diabetes. The patient was 25 years old.  A. The patient had been diagnosed with type 1 diabetes in February of 1995 at age 25. He was on an insulin pump when I first met him. His hemoglobin A1c had increased from 7.1% in December of 2005 to 8.2% in April 2006. He was having frequent snacks that he was not taking boluses for. He was also frequently noncompliant with checking blood sugars and taking meal boluses. I adjusted his basal rates.  B. At his next [name] visit on 06/19/05, he was still very noncompliant. His mental status was normal. He had a 25 gram goiter. His father was serving in [place] with the [employer]. There was a lot of stress at home. I again adjusted his basal rates.  C. At the end of July 2006 we closed our satellite office in [place]. The patient elected to be followed at our clinic here in [place]. I have followed him here ever since.  2. During the past eleven years, the patient has had several clinical issues.   A. T1DM: The patient's blood glucose control has usually been fairly good for a  teenager. His maximum hemoglobin A1c occurred on 04/16/06 when the hemoglobin A1c was 12.1%. Since then the hemoglobin A1c values have varied between 6.9% and 8.6%. He is still fairly noncompliant with checking blood sugars, bolusing, and changing his insulin pump sites on time, but he is more compliant and more careful than he used to be. He has not required hospital re-admission for DKA, severe hyperglycemia, or hypoglycemia.   B. Goiter, Hashimoto's thyroiditis, and hypothyroidism: In late 2008 and early 2009 the patient developed hypothyroidism secondary to Hashimoto's disease. I started him on Synthroid, 25 mcg per day. When he takes his medication, he is euthyroid.  C. Autonomic neuropathy and inappropriate sinus tachycardia: In late 2008 he developed autonomic neuropathy which manifested as inappropriate sinus tachycardia. These problems varied in parallel with his BGs and HbA1c values.  D. Diabetic peripheral neuropathy: The patient has had symptoms and signs of mild diabetic peripheral neuropathy at several times in the past. His peripheral neuropathic signs and symptoms similarly varied in parallel with his BGs and HbA1c values.   E. Non-proliferative diabetic retinopathy: This problem was identified during a diabetic eye exam in June 2010.  F. Hypogonadotropic hypogonadism: The patient has always been somewhat lackadaisical and relatively passive. On 06/29/10 his FSH was 4.9, LH was 2.4, testosterone was 399.66, and free testosterone was 79.8. The testosterone and free testosterone were relatively low for an 25 year old young man. At the same time the [facility] and LH while "normal", appeared to be actually inappropriately low for the level of testosterone he had. We discussed the options for treatment including exercise and medication, to include AndroGel. The patient  chose the exercise option. By 10/19/10 the [facility] was 6.8, the LH was 3.2, total testosterone was 615.88 and the free testosterone was 131.6. In  retrospect, when he was more depressed he may have had less gonadotropic function. We will continue to follow up on this issue over time.  G. Depression/Anxiety/Personality disorders:    1). The patient has been diagnosed with depression at different times in the past. For some time he took Remeron (mirtazapine), but discontinued this in early 2012. I had been trying to convince him for some time that he was depressed and that he should seek out help. He eventually did decide to seek psychiatric help.    2). On 06/08/13 Kyllian was evaluated by [name], a noted psychiatrist in [place].  [name] and his staff performed neuropsychiatric testing. On the DEMARRIO MENGES had problems with non-verbal reasoning and emotional intelligence. 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 [name] prescribed escitalopram, 10 mg/day. Marquist was supposed to continue to see [name] in follow up. Unfortunately, Keyan did not followed up.    3). Lawerance was then referred to a therapist, [name]. Amaree enjoys working with Dr. [name] and feels that he is making progress.    H. 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   3. The patient's last [facility] visit was on 04/25/16. In the interim, he has been healthy.   A. On 04/09/16 Scott Moran transitioned to the new Medtronic 630G pump and Enlite sensor.  He has had two problems with his Enlite sensor insertions. His 630G pump is working well. He likes his sensor-pump combination when it works well. The sensor-pump combination significantly reduces his personal stress levels. He feels much more confident in his DM care when he is on the sensor-pump combination.  B. He will start his 670G pump and Guardian sensor next Monday.   Rebeca Alert saw [name], his  therapist, about two weeks ago. Zeven feels that he is making progress. Tony re-started college courses at [employer] on 07/16/16.   D. His sleep pattern still varies. He is still adjusting to his new academic schedule.   Heinz Knuckles takes his Synthroid dose of 25 mcg/day every night. He is not taking lisinopril and his escitalopram very often.   F. He now has his learner's permit and is learning to drive. He does not feel ready yet to drive on his own.    4. Pertinent Review of Systems:  Constitutional: Jahmez feels "pretty OK". His allergies are acting up a bit. He is enjoying driving, but feels that too many other drivers are "stupid". Overall his depression is much better. He now leaves his house much more often. However, he still spends many hours on the web.  Eyes: Vision is good with his glasses. There are no significant eye complaints. Last eye exam was in the late Fall 2016. There were no signs of diabetic retinopathy. Mouth: No problems.  Neck: The patient has no complaints of anterior neck swelling, soreness, tenderness,  pressure, discomfort, or difficulty swallowing.  Heart: Heart rate increases with exercise or other physical activity. The patient has no complaints of palpitations, irregular heat beats, chest pain, or chest pressure. Gastrointestinal: No postprandial bloating. Bowel movents seem normal. The patient has no other complaints of excessive hunger, acid reflux, upset stomach, stomach aches or  pains, diarrhea, or constipation. Legs: Muscle mass and strength seem normal. There are no complaints of numbness, tingling, burning, or pain. No edema is noted. Feet: There are no obvious foot problems. There are no complaints of numbness, tingling, burning, or pain. No edema is noted. Hypoglycemia: He has not had more low BGs recently.   Emotional/psychological: The patient feels "pretty good" emotionally. He has been more willing to accomplish his DM care tasks, in part because his new  pump is working better for him. He is still unwilling to take his lisinopril and escitalopram.  5. BG printout: He is changing his sites and/or his insulins about every 3-6 days. He sometimes changes the reservoirs, but not the tubing or the sites. Sometimes he changes the tubing, but not the sites. Sometimes he changes all three.  He is doing a better job of checking BGs and taking boluses. He checks BGs 0-3 times per day. The longest he's gone between BG checks was 12 hours. He boluses 3-7 times per day. The longest he's gone between boluses was 72 hours. His average BG is 249, range 196- >400, compared with an average 197  and BG range of 51 -> 400 at his last visit. The  longer he leaves sites in, and the longer he goes between BG checks and insulin boluses, the more variability in his BGs.   6. Sensor printout: none   PAST MEDICAL, FAMILY, AND SOCIAL HISTORY:  Past Medical History:  Diagnosis Date  . DM type 1 with diabetic peripheral neuropathy ([place])   . Goiter   . Hypertension   . Hypoglycemia associated with diabetes ([place])   . Hypogonadotropic hypogonadism ([place])   . Hypothyroidism, acquired, autoimmune   . Tachycardia   . Thyroiditis, autoimmune   . Type 1 diabetes mellitus not at goal [place])   . Type 1 diabetes mellitus with diabetic autonomic neuropathy ([facility])     Family History  Problem Relation Age of Onset  . Diabetes Maternal Uncle   . Hypertension Maternal Grandmother   . Thyroid disease Neg Hx   . Obesity Neg Hx   . Kidney disease Neg Hx   . Cancer Neg Hx   . Anemia Neg Hx      Current Outpatient Prescriptions:  .  acetone, urine, test strip, Check ketones per protocol, Disp: 50 each, Rfl: 3 .  glucagon (GLUCAGON EMERGENCY) 1 MG injection, Inject 1 mg intramuscularly in thigh 1 time for severe hypoglycemia if unresponsive, unconscious, unable to swallow or has a seizure., Disp: 2 kit, Rfl: 3 .  glucagon (GLUCAGON EMERGENCY) 1 MG injection, Inject 1 mg into the vein  once as needed., Disp: 2 kit, Rfl: 2 .  glucose blood (BAYER CONTOUR NEXT TEST) test strip, Check sugar 10 x daily, Disp: 300 each, Rfl: 3 .  insulin aspart (NOVOLOG) 100 UNIT/ML injection, USE 300 UNITS IN PUMP EVERY 48-72 HOURS AND PER PROTOCOL FOR HYPERGLYCEMIA AND DKA OUTPATIENT TREATMENT., Disp: 120 mL, Rfl: 5 .  levothyroxine (SYNTHROID) 25 MCG tablet, Take 1 tablet (25 mcg total) by mouth daily before breakfast., Disp: 30 tablet, Rfl: 4 .  levothyroxine (SYNTHROID, LEVOTHROID) 25 MCG tablet, Take one tablet daily. (Patient taking differently: Take 25 mcg by mouth daily. Take one tablet daily.), Disp: 90 tablet, Rfl: 3 .  escitalopram (LEXAPRO) 10 MG tablet, Take 1 tablet (10 mg total) by mouth daily. (Patient not taking: Reported on 09/15/2015), Disp: 90 tablet, Rfl: 3 .  insulin aspart (NOVOLOG FLEXPEN) 100 UNIT/ML FlexPen, Use  up to 50 units daily (Patient not taking: Reported on 09/15/2015), Disp: 5 pen, Rfl: 6 .  Insulin Glargine (LANTUS SOLOSTAR) 100 UNIT/ML Solostar Pen, Use up to 50 units daily (Patient not taking: Reported on 09/15/2015), Disp: 5 pen, Rfl: 6 .  ondansetron (ZOFRAN ODT) 4 MG disintegrating tablet, Take 1 tablet (4 mg total) by mouth every 8 (eight) hours as needed for nausea or vomiting. (Patient not taking: Reported on 10/20/2015), Disp: 20 tablet, Rfl: 0 .  promethazine (PHENERGAN) 25 MG tablet, Take 1 tablet (25 mg total) by mouth every 6 (six) hours as needed for nausea or vomiting. (Patient not taking: Reported on 10/20/2015), Disp: 20 tablet, Rfl: 0  Allergies as of 07/26/2016  . (No Known Allergies)    1. Work and Family: Patient re-started courses at [employer] on 07/16/16.  2. Activities: Few physical activities. He is a history buff and frequently participates in online chat room discussions. 3. Smoking, alcohol, or drugs: None 4. Primary Care Provider: He no longer has a primary care provider.  REVIEW OF SYSTEMS: There are no other significant problems involving  [place]'s other body systems.   Objective:  Vital Signs:  BP 122/82   Pulse 96   Wt 138 lb 12.8 oz (63 kg)   BMI 20.50 kg/m     Ht Readings from Last 3 Encounters:  09/15/15 '5\' 9"'$  (1.753 m)  08/22/13 '5\' 7"'$  (1.702 m)   Wt Readings from Last 3 Encounters:  07/26/16 138 lb 12.8 oz (63 kg)  07/10/16 136 lb (61.7 kg)  05/28/16 133 lb 6.4 oz (60.5 kg)   PHYSICAL EXAM:  Constitutional: Aaren was bright and alert today, but still slender. He has gained another 5 pounds. He is very upbeat and chatty today. His affect is normal. His flow of thoughts and speech fluency are normal. His comments are rational and lucid. He is very intelligent, but has not been very consistent with his diabetes care this month. Face: The face appears normal.  Eyes: There is no obvious arcus or proptosis. Moisture appears normal. Mouth: The oropharynx and tongue appear normal. Oral moisture is normal. Neck: The neck looks normal. No carotid bruits are noted. The thyroid gland is smaller at 21 grams in size today.  The right lobe is at the upper end of the normal range. The left lobe is mildly enlarged. The consistency of the thyroid gland is normal today.The thyroid gland is not tender to palpation. Lungs: The lungs are clear to auscultation. Air movement is good. Heart: Heart rate remains tachycardic. Heart rhythm is regular. Heart sounds S1 and S2 are normal. I did not appreciate any pathologic cardiac murmurs. Abdomen: The abdomen is normal in size, but larger. Bowel sounds are normal. There is no obvious hepatomegaly, splenomegaly, or other mass effect.  Arms: Muscle size and bulk are normal for age. Hands: There is no obvious tremor. Phalangeal and metacarpophalangeal joints are normal. Palmar muscles are normal. Palmar skin is normal. Palmar moisture is also normal. Legs: Muscles appear normal for age. No edema is present. Feet: Feet are normally formed. Dorsalis pedal pulses are 1+ bilaterally.    Neurologic: Strength is normal for age in both the upper and lower extremities. Muscle tone is normal. Sensation to touch is normal in both legs, but decreased in the left heel.     LAB DATA:   Recent Results (from the past 504 hour(s))  POCT Glucose (CBG)   Collection Time: 07/10/16 10:02 AM  Result Value Ref Range  POC Glucose 216 (A) 70 - 99 mg/dl  POCT Glucose (CBG)   Collection Time: 07/26/16  2:10 PM  Result Value Ref Range   POC Glucose 277 (A) 70 - 99 mg/dl  POCT HgB A1C   Collection Time: 07/26/16  2:20 PM  Result Value Ref Range   Hemoglobin A1C 7.8    Labs 07/25/16: HbA1c 7.8%  Labs 04/25/16: HbA1c 7.7%  Labs 04/09/16: LH 3.4, FSH 6.0, testosterone 504; cholesterol 176, triglycerides 92, HDL 80, LDL 88; CMP norma except for glucose 224; TSH 1.75, free T4 1.2, free T3 3.2; microalbumin/creatinine ratio 4  Labs 01/24/16: HbA1c 8.6%  Labs 10/20/15: HbA1c 8.1%  Labs 09/15/15: BMP normal except for glucose of 143 and calcium 8.3.  Labs 07/20/15: HbA1c 8.2%  Labs 03/25/15: HbA1c 7.8%  Labs 12/02/14: HbA1c 8.5%;  LH 4.0, FSH 8.2, testosterone 621.97; CMP normal; TSH 2.01, free T4 1.21, free T3 3.6; cholesterol 155, triglycerides 46, HDL  55, LDL 91; microalbumin/creatinine ratio 6.6  Labs 03/30/14: TSH 1.935, free T4 1.12, free T3 3.2; CMP: normal except for glucose 203; testosterone 624, FSH 6.3, LH 2.4; urine microalbumin/creatinine ratio 6.4; cholesterol 163, triglycerides 55, HDL 64, LDL 80  Labs 08/23/13: TSH 1.139; CMP normal except for a glucose of 145; CBC: WBC 5.4, Hgb 12.7, Hct 34.7%   Assessment and Plan:   ASSESSMENT:  1-2. Type 1 diabetes mellitus/hypoglycemia:   A. Arseniy is not doing as well at checking BGs and bolusing in the past month.  B. His BGs have generally been higher this past month.  C. He has not had any documented episodes of hypoglycemia. 3. Autonomic neuropathy and inappropriate sinus tachycardia: These problems are somewhat better.  Fortunately, these problems will reverse with better BG control.   4. Peripheral neuropathy: This problem is about the same as at his last visit.   5. Hypothyroid: He was euthyroid in December 2015 on his current dose of Synthroid. He was again euthyroid in May 2017. 6. Hypogonadotropic hypogonadism: His testosterone level in May 2017 was lower than in December 2015.   7. Hypertension: His BPs are somewhat better. He still needs to take his lisinopril.   8. Goiter/Hashimoto's disease: His goiter is smaller today. The pattern of waxing and waning of thyroid gland size is c/w evolving thyroiditis. Thyroiditis is clinically quiescent 9. Depression: This problem is better today. He is not clinically depressed today, but still can't consistently make himself what he needs to do.  10. Noncompliance: He was doing better immediately after starting the new sensor-pump combination, but has not done well in the past month. I still continue to hope that as he ages and matures, he will do a better job of taking care of his T1DM and his life. The fact that he is returning to college is wonderful prognostic sign.  11. Weight loss, unintentional: This problem has resolved.       PLAN:  1. Diagnostic: HbA1c today. Reviewed the results of his annual surveillance labs in May. Call in 4 weeks to discuss BGs.  2. Therapeutic: Continue Synthroid. Resume lisinopril and escitalopram. Give both correction boluses and food boluses. Use temporary basal rates of 80% when he plans to sleep in. Continue the current basal rates:  MN: 0.750 5 AM: 0.750 8 AM: 0.800 Noon: 0.950 6 PM: 1.500 9 PM: 1.200 3. Patient education: He knows what he should do and knows how to do it. He still needs to commit to action. 4. Follow-up: 3 months  Level  of Service: This visit lasted in excess of 60 minutes. More than 50% of the visit was devoted to counseling.  Sherrlyn Hock, MD 07/26/2016 2:31 PM

## 2016-07-26 NOTE — Patient Instructions (Signed)
Follow up visit in 3 months. Call in at night for several nights after starting his 670G pump and sensor.

## 2016-07-30 ENCOUNTER — Ambulatory Visit (INDEPENDENT_AMBULATORY_CARE_PROVIDER_SITE_OTHER): Payer: BLUE CROSS/BLUE SHIELD | Admitting: *Deleted

## 2016-07-30 DIAGNOSIS — IMO0001 Reserved for inherently not codable concepts without codable children: Secondary | ICD-10-CM

## 2016-07-30 DIAGNOSIS — E109 Type 1 diabetes mellitus without complications: Secondary | ICD-10-CM

## 2016-07-30 DIAGNOSIS — E1065 Type 1 diabetes mellitus with hyperglycemia: Principal | ICD-10-CM

## 2016-07-30 LAB — GLUCOSE, POCT (MANUAL RESULT ENTRY): POC Glucose: 379 mg/dl — AB (ref 70–99)

## 2016-07-30 NOTE — Progress Notes (Signed)
Guardian sensor start with the 670G insulin pump  [name] was here with his mom for the start of the Guardian sensor. He started on the 670G insulin pump two weeks ago and the Guardian sensors were on back order. He has worn the 630G with the Enlite sensors and he is very familiar with the pump and sensors now.  We set the sensor settings per Dr. Fransico MichaelBrennan, Low Alerts !2a-12a  70  Alert on low  On Suspend before low On  High Alerts 12a-12a  300 Alert on High  On Alert before high  Off  Snooze   High    1.5 hours Low   20 mins  Patient did very good in adding settings to insulin pump and with the sensor insertion. Scheduled Auto mode start for two weeks out.  Remember to calibrate, carb counting and use care link to download insulin pump. Call our office if any questions or concerns regarding your pump or sensor.

## 2016-08-01 DIAGNOSIS — F4320 Adjustment disorder, unspecified: Secondary | ICD-10-CM | POA: Diagnosis not present

## 2016-08-14 ENCOUNTER — Ambulatory Visit (INDEPENDENT_AMBULATORY_CARE_PROVIDER_SITE_OTHER): Payer: BLUE CROSS/BLUE SHIELD | Admitting: *Deleted

## 2016-08-14 ENCOUNTER — Encounter: Payer: Self-pay | Admitting: *Deleted

## 2016-08-14 VITALS — BP 135/77 | HR 110 | Wt 139.2 lb

## 2016-08-14 DIAGNOSIS — E109 Type 1 diabetes mellitus without complications: Secondary | ICD-10-CM | POA: Diagnosis not present

## 2016-08-14 DIAGNOSIS — E1065 Type 1 diabetes mellitus with hyperglycemia: Principal | ICD-10-CM

## 2016-08-14 DIAGNOSIS — Z23 Encounter for immunization: Secondary | ICD-10-CM

## 2016-08-14 DIAGNOSIS — IMO0001 Reserved for inherently not codable concepts without codable children: Secondary | ICD-10-CM

## 2016-08-14 LAB — GLUCOSE, POCT (MANUAL RESULT ENTRY): POC GLUCOSE: 228 mg/dL — AB (ref 70–99)

## 2016-08-14 NOTE — Progress Notes (Signed)
Medtronic's auto mode start  Scott Moran was here with his mom and [name] for the start of the Auto mode on his 670G insulin pump. He grabbed the wrong box of sensors, so [name] decided to train him on Auto mode and when he gets home he can start his sensor and start the auto mode. After reviewing pump download asked Dr. Fransico MichaelBrennan to make adjustments to his settings and changed his basal settings to the following listed below:  Basal Rates Time   Old rate New Rate 12a  0.750  0.800 5a  0.750  0.800 8a  0.800  0.800 12p  0.950  1.100 6p  1.500  1.600 9p  1.20  1.300 Total units 23.00  24.90  Patient added new basal changes and was able to start pump on auto mode, but pump waiting for sensor reading. Once he starts his new sensor then pump will start Auto mode.  [name] informed patient that she will follow with a phone call tonight to make sure did not have any problems. Also patient was advised by Dr. Fransico MichaelBrennan to call in Sunday night with Bg readings and report status of auto pump mode. Patient verbalized understanding information and expressed readiness for Auto mode on insulin pump.  Call our office if any questions or concerns regarding your pump or sensor.

## 2016-08-23 ENCOUNTER — Telehealth: Payer: Self-pay | Admitting: "Endocrinology

## 2016-08-23 DIAGNOSIS — F4320 Adjustment disorder, unspecified: Secondary | ICD-10-CM | POA: Diagnosis not present

## 2016-08-23 NOTE — Telephone Encounter (Signed)
Received telephone call from Scott NeedleMichael 1. Overall status: Auto mode on his 670G pump is working well.  2. New problems: None 3. Last site change: Two days ago 4. Rapid-acting insulin: Novolog 5. BG log: 2 AM, Breakfast, Lunch, Supper, Bedtime 08/21/16: 248/176, xxx, 134, xxx, 286 08/22/16: 214/231/192/254/148, 268/248, 134, 209, xxx 08/23/16: 161/270, xxx, 153, 231, pending 6. Assessment: His BGs are better, but still quite variable. 7. Plan: Change sites at least every 3 days. 8. FU call: Sunday evening October 1st.  Scott Moran,Scott Moran

## 2016-08-27 DIAGNOSIS — E1065 Type 1 diabetes mellitus with hyperglycemia: Secondary | ICD-10-CM | POA: Diagnosis not present

## 2016-08-27 DIAGNOSIS — E109 Type 1 diabetes mellitus without complications: Secondary | ICD-10-CM | POA: Diagnosis not present

## 2016-08-27 DIAGNOSIS — E108 Type 1 diabetes mellitus with unspecified complications: Secondary | ICD-10-CM | POA: Diagnosis not present

## 2016-08-27 DIAGNOSIS — Z794 Long term (current) use of insulin: Secondary | ICD-10-CM | POA: Diagnosis not present

## 2016-09-02 ENCOUNTER — Telehealth: Payer: Self-pay | Admitting: "Endocrinology

## 2016-09-02 NOTE — Telephone Encounter (Signed)
Received telephone call from [name] 1. Overall status: Things are pretty OK. 2. New problems: He did not receive his sensor shipment, so has been without his sensor for 5 days. 3. Last site change: 4 days ago 4. Rapid-acting insulin: Novolog 5. BG log: 2 AM, Breakfast, Lunch, Supper, Bedtime 08/31/16: xxx, xxx, 134, xxx, xxx 09/01/16: xxx, xxx, 143, xxx, 243 09/02/16: xxx, xxx, 223/319/293/259 6. Assessment: His BGs are increasing due to his site gradually deteriorating. He really needs to have the sensor in place and needs to check his BGs and take insulins accordingly.  7. Plan: Call again re obtaining sensors. Check BGs, take insulins.  8. FU call: one week after the sensors have been obtained NCR CorporationBRENNAN,Narada J

## 2016-09-13 DIAGNOSIS — F4320 Adjustment disorder, unspecified: Secondary | ICD-10-CM | POA: Diagnosis not present

## 2016-09-28 DIAGNOSIS — E108 Type 1 diabetes mellitus with unspecified complications: Secondary | ICD-10-CM | POA: Diagnosis not present

## 2016-09-28 DIAGNOSIS — Z794 Long term (current) use of insulin: Secondary | ICD-10-CM | POA: Diagnosis not present

## 2016-09-28 DIAGNOSIS — E1065 Type 1 diabetes mellitus with hyperglycemia: Secondary | ICD-10-CM | POA: Diagnosis not present

## 2016-09-28 DIAGNOSIS — E109 Type 1 diabetes mellitus without complications: Secondary | ICD-10-CM | POA: Diagnosis not present

## 2016-10-04 DIAGNOSIS — F4320 Adjustment disorder, unspecified: Secondary | ICD-10-CM | POA: Diagnosis not present

## 2016-10-17 DIAGNOSIS — E108 Type 1 diabetes mellitus with unspecified complications: Secondary | ICD-10-CM | POA: Diagnosis not present

## 2016-10-17 DIAGNOSIS — E109 Type 1 diabetes mellitus without complications: Secondary | ICD-10-CM | POA: Diagnosis not present

## 2016-10-17 DIAGNOSIS — Z794 Long term (current) use of insulin: Secondary | ICD-10-CM | POA: Diagnosis not present

## 2016-10-18 DIAGNOSIS — F4320 Adjustment disorder, unspecified: Secondary | ICD-10-CM | POA: Diagnosis not present

## 2016-10-27 ENCOUNTER — Other Ambulatory Visit (INDEPENDENT_AMBULATORY_CARE_PROVIDER_SITE_OTHER): Payer: Self-pay | Admitting: "Endocrinology

## 2016-10-27 DIAGNOSIS — E038 Other specified hypothyroidism: Secondary | ICD-10-CM

## 2016-10-30 ENCOUNTER — Ambulatory Visit (INDEPENDENT_AMBULATORY_CARE_PROVIDER_SITE_OTHER): Payer: BLUE CROSS/BLUE SHIELD | Admitting: "Endocrinology

## 2016-10-30 ENCOUNTER — Encounter (INDEPENDENT_AMBULATORY_CARE_PROVIDER_SITE_OTHER): Payer: Self-pay | Admitting: "Endocrinology

## 2016-10-30 VITALS — BP 124/86 | HR 104 | Wt 141.2 lb

## 2016-10-30 DIAGNOSIS — E1043 Type 1 diabetes mellitus with diabetic autonomic (poly)neuropathy: Secondary | ICD-10-CM

## 2016-10-30 DIAGNOSIS — E038 Other specified hypothyroidism: Secondary | ICD-10-CM

## 2016-10-30 DIAGNOSIS — IMO0001 Reserved for inherently not codable concepts without codable children: Secondary | ICD-10-CM

## 2016-10-30 DIAGNOSIS — E10649 Type 1 diabetes mellitus with hypoglycemia without coma: Secondary | ICD-10-CM

## 2016-10-30 DIAGNOSIS — E063 Autoimmune thyroiditis: Secondary | ICD-10-CM | POA: Diagnosis not present

## 2016-10-30 DIAGNOSIS — Z91148 Patient's other noncompliance with medication regimen for other reason: Secondary | ICD-10-CM

## 2016-10-30 DIAGNOSIS — I10 Essential (primary) hypertension: Secondary | ICD-10-CM | POA: Diagnosis not present

## 2016-10-30 DIAGNOSIS — Z9114 Patient's other noncompliance with medication regimen: Secondary | ICD-10-CM | POA: Diagnosis not present

## 2016-10-30 DIAGNOSIS — E1042 Type 1 diabetes mellitus with diabetic polyneuropathy: Secondary | ICD-10-CM | POA: Diagnosis not present

## 2016-10-30 DIAGNOSIS — F329 Major depressive disorder, single episode, unspecified: Secondary | ICD-10-CM

## 2016-10-30 DIAGNOSIS — E049 Nontoxic goiter, unspecified: Secondary | ICD-10-CM

## 2016-10-30 DIAGNOSIS — F32A Depression, unspecified: Secondary | ICD-10-CM

## 2016-10-30 DIAGNOSIS — E1065 Type 1 diabetes mellitus with hyperglycemia: Secondary | ICD-10-CM

## 2016-10-30 LAB — GLUCOSE, POCT (MANUAL RESULT ENTRY): POC Glucose: 174 mg/dl — AB (ref 70–99)

## 2016-10-30 LAB — POCT GLYCOSYLATED HEMOGLOBIN (HGB A1C): Hemoglobin A1C: 7.5

## 2016-10-30 NOTE — Progress Notes (Signed)
Subjective:  Patient Name: Scott Moran Date of Birth: 1991/01/22  MRN: [id number]  Scott Moran  presents to the office today for follow-up of his type 1 diabetes mellitus, goiter, hypoglycemia, depression, social anxiety disorder, avoidant personality disorder, hypothyroidism, thyroiditis, diabetic autonomic neuropathy, inappropriate sinus tachycardia, non-proliferative diabetic retinopathy, diabetic peripheral neuropathy, fatigue, hypogonadotropic hypogonadism, and medical non-compliance.  HISTORY OF PRESENT ILLNESS:   Scott Moran is a 24 y.o. Caucasian young man. Scott Moran was unaccompanied.   1. I first saw the patient in consultation on 03/27/05 in the [facility] in [place], [place]. He had been referred by his primary care provider, [name], in [place], [place], for evaluation and treatment of poorly controlled type 1 diabetes. The patient was 25 years old.  A. The patient had been diagnosed with type 1 diabetes in February of 1995 at age 28. He was on an insulin pump when I first met him. His hemoglobin A1c had increased from 7.1% in December of 2005 to 8.2% in April 2006. He was having frequent snacks that he was not taking boluses for. He was also frequently noncompliant with checking blood sugars and taking meal boluses. I adjusted his basal rates.  B. At his next  visit on 06/19/05, he was still very noncompliant. His mental status was normal. He had a 25 gram goiter. His father was serving in [place] with the [employer]. There was a lot of stress at home. I again adjusted his basal rates.  C. At the end of July 2006 we closed our satellite office in [place]. The patient elected to be followed at our clinic here in [place]. I have followed him here ever since.  2. During the past eleven years, the patient has had several clinical issues.   A. T1DM: The patient's blood glucose control has usually been fairly good for a  teenager. His maximum hemoglobin A1c occurred on 04/16/06 when the hemoglobin A1c was 12.1%. Since then the hemoglobin A1c values have varied between 6.9% and 8.6%. He is still fairly noncompliant with checking blood sugars, bolusing, and changing his insulin pump sites on time, but he is more compliant and more careful than he used to be. He has not required hospital re-admission for DKA, severe hyperglycemia, or hypoglycemia.   B. Goiter, Hashimoto's thyroiditis, and hypothyroidism: In late 2008 and early 2009 the patient developed hypothyroidism secondary to Hashimoto's disease. I started him on Synthroid, 25 mcg per day. When he takes his medication, he is euthyroid.  C. Autonomic neuropathy and inappropriate sinus tachycardia: In late 2008 he developed autonomic neuropathy which manifested as inappropriate sinus tachycardia. These problems varied in parallel with his BGs and HbA1c values.  D. Diabetic peripheral neuropathy: The patient has had symptoms and signs of mild diabetic peripheral neuropathy at several times in the past. His peripheral neuropathic signs and symptoms similarly varied in parallel with his BGs and HbA1c values.   E. Non-proliferative diabetic retinopathy: This problem was identified during a diabetic eye exam in June 2010.  F. Hypogonadotropic hypogonadism: The patient has always been somewhat lackadaisical and relatively passive. On 06/29/10 his FSH was 4.9, LH was 2.4, testosterone was 399.66, and free testosterone was 79.8. The testosterone and free testosterone were relatively low for an 25 year old young man. At the same time the [facility] and LH while "normal", appeared to be actually inappropriately low for the level of testosterone he had. We discussed the options for treatment including exercise and medication, to include AndroGel. The patient  chose the exercise option. By 10/19/10 the [facility] was 6.8, the LH was 3.2, total testosterone was 615.88 and the free testosterone was 131.6. In  retrospect, when he was more depressed he may have had less gonadotropic function. We will continue to follow up on this issue over time.  G. Depression/Anxiety/Personality disorders:    1). The patient has been diagnosed with depression at different times in the past. For some time he took Remeron (mirtazapine), but discontinued that medication in early 2012. I had been trying to convince him for some time that he was depressed and that he should seek out help. He eventually did decide to seek psychiatric help.    2). On 06/08/13 Scott Moran was evaluated by Dr. Piedad Climes, a noted psychiatrist in [place].  Dr. Shane Crutch and his staff performed neuropsychiatric testing. On the MEKHI SONN had problems with non-verbal reasoning and emotional intelligence. Dr. 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 Dr. Shane Crutch prescribed escitalopram, 10 mg/day. Scott Moran was supposed to continue to see Dr. Shane Crutch in follow up. Unfortunately, Scott Moran did not followed up.    3). Scott Moran was then referred to a therapist, Scott Moran. Scott Moran enjoys working with Dr. Loreta Ave and feels that he is making progress.    H. 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   3. The patient's last [facility] visit was on 07/26/16. In the interim, he has been healthy.   A. In late August he transitioned to the Medtronic 670G pump and Guardian 3 sensor. He likes the new combination, but has been off the sensor for 2-3 weeks. When he had problems with the sensor he took it off, did not call [employer], did not call us, and did not put on another sensor. When I asked him why he had not pursued any of these courses of action, he replied that he just couldn't seem to force himself to take any action.    B. His maternal uncle with T1DM died last week, possibly  due to nocturnal hypoglycemia.   Scott Moran saw Scott Moran, his therapist, about 2-3 weeks ago. Alter feels that he is making progress. Scott Moran continues to take classes at [employer].    D. His sleep pattern still varies. He has adjusted fairly well to his new academic schedule.   Scott Moran takes his Synthroid dose of 25 mcg/day every night. He is not taking lisinopril and his escitalopram very often.   F. He now has his learner's permit and is learning to drive. He does not feel ready yet to drive on his own.    4. Pertinent Review of Systems:  Constitutional: Viggo feels "pretty OK". He remains fairly depressed, but is less depressed than he was 6-12 months ago. His allergies are acting up a bit. He interacts with others at school now. He also leaves his house much more often now. However, he still spends many hours on the web.  Eyes: Vision is good with his glasses. There are no significant eye complaints. Last eye exam was in November 2017. There were no signs of diabetic retinopathy. Mouth: No problems.  Neck: The patient has no complaints of anterior neck swelling, soreness, tenderness,  pressure, discomfort, or difficulty swallowing.  Heart: Heart rate increases with exercise or other physical activity. The patient has no complaints of palpitations, irregular heat beats, chest pain, or  chest pressure. Gastrointestinal: No postprandial bloating. Bowel movents seem normal. The patient has no other complaints of excessive hunger, acid reflux, upset stomach, stomach aches or pains, diarrhea, or constipation. Legs: Muscle mass and strength seem normal. There are no complaints of numbness, tingling, burning, or pain. No edema is noted. Feet: There are no obvious foot problems. There are no complaints of numbness, tingling, burning, or pain. No edema is noted. Hypoglycemia: He has had a few low BGs recently, but none severe.   Emotional/psychological: The patient feels "pretty OK" emotionally.  He is still unwilling to take his lisinopril and escitalopram.  5. BG printout: He is changing his sites and/or his insulins about every 1-6 days. He sometimes changes the reservoirs, but not the tubing or the sites. Sometimes he changes the tubing, but not the sites. Sometimes he changes all three.  According to his printout he may go 2 days without checking BGs, but his meter shows some BGs that are not on the printout. He boluses from 1-5 times per day. His only documented low BG was a 55 on in the afternoon of 10/26/16. He had two BGS >400 in a row on 10/20/16 due to a bad site. His average BG is 240, range 55 to >400, compared with 249, range 196- >400 at his last visit. The  longer he leaves sites in, the longer his sensor is off, and the longer he goes between BG checks and insulin boluses, the more variability in his BGs.   6. Sensor printout: none   PAST MEDICAL, FAMILY, AND SOCIAL HISTORY:  Past Medical History:  Diagnosis Date  . DM type 1 with diabetic peripheral neuropathy ([place])   . Goiter   . Hypertension   . Hypoglycemia associated with diabetes ([place])   . Hypogonadotropic hypogonadism ([place])   . Hypothyroidism, acquired, autoimmune   . Tachycardia   . Thyroiditis, autoimmune   . Type 1 diabetes mellitus not at goal [facility])   . Type 1 diabetes mellitus with diabetic autonomic neuropathy ([facility])     Family History  Problem Relation Age of Onset  . Diabetes Maternal Uncle   . Hypertension Maternal Grandmother   . Thyroid disease Neg Hx   . Obesity Neg Hx   . Kidney disease Neg Hx   . Cancer Neg Hx   . Anemia Neg Hx      Current Outpatient Prescriptions:  .  acetone, urine, test strip, Check ketones per protocol, Disp: 50 each, Rfl: 3 .  glucagon (GLUCAGON EMERGENCY) 1 MG injection, Inject 1 mg intramuscularly in thigh 1 time for severe hypoglycemia if unresponsive, unconscious, unable to swallow or has a seizure., Disp: 2 kit, Rfl: 3 .  glucose blood (BAYER CONTOUR NEXT  TEST) test strip, Check sugar 10 x daily, Disp: 300 each, Rfl: 3 .  insulin aspart (NOVOLOG) 100 UNIT/ML injection, USE 300 UNITS IN PUMP EVERY 48-72 HOURS AND PER PROTOCOL FOR HYPERGLYCEMIA AND DKA OUTPATIENT TREATMENT., Disp: 120 mL, Rfl: 5 .  levothyroxine (SYNTHROID, LEVOTHROID) 25 MCG tablet, TAKE 1 TABLET BY MOUTH DAILY BEFORE BREAKFAST., Disp: 30 tablet, Rfl: 5 .  escitalopram (LEXAPRO) 10 MG tablet, Take 1 tablet (10 mg total) by mouth daily. (Patient not taking: Reported on 10/30/2016), Disp: 90 tablet, Rfl: 3 .  ondansetron (ZOFRAN ODT) 4 MG disintegrating tablet, Take 1 tablet (4 mg total) by mouth every 8 (eight) hours as needed for nausea or vomiting. (Patient not taking: Reported on 10/30/2016), Disp: 20 tablet, Rfl: 0 .  promethazine (  PHENERGAN) 25 MG tablet, Take 1 tablet (25 mg total) by mouth every 6 (six) hours as needed for nausea or vomiting. (Patient not taking: Reported on 10/30/2016), Disp: 20 tablet, Rfl: 0  Allergies as of 10/30/2016  . (No Known Allergies)    1. Work and Family: Patient re-started courses at [employer] on 07/16/16.  2. Activities: Few physical activities. He is a history buff and frequently participates in online forum discussions. 3. Smoking, alcohol, or drugs: None 4. Primary Care Provider: He no longer has a primary care provider.  REVIEW OF SYSTEMS: There are no other significant problems involving Scott Moran's other body systems.   Objective:  Vital Signs:  BP 124/86   Pulse (!) 104   Wt 141 lb 3.2 oz (64 kg)   BMI 20.85 kg/m  HR 1`   Ht Readings from Last 3 Encounters:  09/15/15 5\' 9"  (1.753 m)  08/22/13 5\' 7"  (1.702 m)   Wt Readings from Last 3 Encounters:  10/30/16 141 lb 3.2 oz (64 kg)  08/14/16 139 lb 3.2 oz (63.1 kg)  07/26/16 138 lb 12.8 oz (63 kg)   PHYSICAL EXAM:  Constitutional: Scott Moran was bright and alert today. He has gained another 2.5 pounds. He is very upbeat and chatty today. His affect is normal. His flow of thoughts and  speech fluency are normal. His comments are rational and lucid. He is very intelligent, but still tends to have trouble making decisions and moving forward when he is depressed. He has been less consistent in his DM care this past month.  Face: The face appears normal.  Eyes: There is no obvious arcus or proptosis. Moisture appears normal. Mouth: The oropharynx and tongue appear normal. Oral moisture is normal. Neck: The neck looks normal. No carotid bruits are noted. The thyroid gland is again mildly enlarged at 21 grams in size today. Both lobes are symmetrically enlarged. The consistency of the thyroid gland is normal today.The thyroid gland is not tender to palpation.  Lungs: The lungs are clear to auscultation. Air movement is good. Heart: Heart rate remains tachycardic. Heart rhythm is regular. Heart sounds S1 and S2 are normal. I did not appreciate any pathologic cardiac murmurs. Abdomen: The abdomen is normal in size, but larger. Bowel sounds are normal. There is no obvious hepatomegaly, splenomegaly, or other mass effect.  Arms: Muscle size and bulk are normal for age. Hands: There is no obvious tremor. Phalangeal and metacarpophalangeal joints are normal. Palmar muscles are normal. Palmar skin is normal. Palmar moisture is also normal. Legs: Muscles appear normal for age. No edema is present. Feet: Feet are normally formed. Dorsalis pedal pulses are 1+ bilaterally.   Neurologic: Strength is normal for age in both the upper and lower extremities. Muscle tone is normal. Sensation to touch is normal in both legs, intact to vibration and monofilament in the feet, but decreased to touch in the left heel.     LAB DATA:   Recent Results (from the past 504 hour(s))  POCT Glucose (CBG)   Collection Time: 10/30/16  2:01 PM  Result Value Ref Range   POC Glucose 174 (A) 70 - 99 mg/dl  POCT HgB Scott Moran   Collection Time: 10/30/16  2:06 PM  Result Value Ref Range   Hemoglobin A1C 7.5    Labs  10/30/16: HbA1c 7.5%  Labs 07/25/16: HbA1c 7.8%  Labs 04/25/16: HbA1c 7.7%  Labs 04/09/16: LH 3.4, FSH 6.0, testosterone 504, free testosterone 85.5 (ref 47-244); cholesterol 176, triglycerides 92, HDL 80,  LDL 88; CMP norma except for glucose 224; TSH 1.75, free T4 1.2, free T3 3.2; microalbumin/creatinine ratio 4  Labs 01/24/16: HbA1c 8.6%  Labs 10/20/15: HbA1c 8.1%  Labs 09/15/15: BMP normal except for glucose of 143 and calcium 8.3.  Labs 07/20/15: HbA1c 8.2%  Labs 03/25/15: HbA1c 7.8%  Labs 12/02/14: HbA1c 8.5%;  LH 4.0, FSH 8.2, testosterone 621.97; CMP normal; TSH 2.01, free T4 1.21, free T3 3.6; cholesterol 155, triglycerides 46, HDL  55, LDL 91; microalbumin/creatinine ratio 6.6  Labs 03/30/14: TSH 1.935, free T4 1.12, free T3 3.2; CMP: normal except for glucose 203; testosterone 624, FSH 6.3, LH 2.4; urine microalbumin/creatinine ratio 6.4; cholesterol 163, triglycerides 55, HDL 64, LDL 80  Labs 08/23/13: TSH 1.139; CMP normal except for a glucose of 145; CBC: WBC 5.4, Hgb 12.7, Hct 34.7%   Assessment and Plan:   ASSESSMENT:  1-2. Type 1 diabetes mellitus/hypoglycemia:   A. Scott Moran has not done as well at checking BGs and using his sensor in the past month.  B. His BGs have generally been a bit lower this past month.  C. He has had only one documented episode of hypoglycemia. 3. Autonomic neuropathy and inappropriate sinus tachycardia: These problems are somewhat worse, c/w him having had more BGs>400. Fortunately, these problems will reverse with better BG control.   4. Peripheral neuropathy: This problem is about the same as at his last visit.   5. Hypothyroid: He was euthyroid in December 2015 on his current dose of Synthroid. He was again euthyroid in May 2017. 6. Hypogonadotropic hypogonadism: His testosterone level in May 2017 was lower than in December 2015. Depression still seems to be a major factor in his testosterone levels.  7. Hypertension: His BPs are somewhat  higher. He needs to take his lisinopril regularly.   8. Goiter/Hashimoto's disease: His goiter is again mildly enlarged today. The pattern of waxing and waning of thyroid gland size is c/w evolving thyroiditis. Thyroiditis is clinically quiescent 9. Depression: This problem is a bit better today. He is not overtly clinically depressed today, but still can't consistently make himself what he needs to do. I again encouraged him to take his escitalopram.  10. Noncompliance: He was doing better immediately after starting the new sensor-pump combination, but has not done well in the past month. I still continue to hope that as he ages and matures, he will do a better job of taking care of his T1DM and his life. The fact that he is continuing his college studies is a good prognostic sign.  11. Weight loss, unintentional: This problem has resolved.       PLAN:  1. Diagnostic: HbA1c today. Reviewed the results of his annual surveillance labs in May. Call in 4 weeks to discuss BGs.  2. Therapeutic: Continue Synthroid. Resume lisinopril and escitalopram. Give both correction boluses and food boluses. Use temporary basal rates of 80% when he plans to sleep in. Continue the current basal rates:  MN: 0.800 5 AM: 0.800 8 AM: 0.800 Noon: 1.10 6 PM: 1.600 9 PM: 1.300 3. 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 Unfortunately, his commitment to positive actions steps is lacking.  4. Follow-up: 3 months  Level of Service: This visit lasted in excess of 60 minutes. More than 50% of the visit was devoted to counseling.  Sherrlyn Hock, MD Adult and Pediatric Endocrinology 10/30/2016 2:53 PM

## 2016-10-30 NOTE — Patient Instructions (Signed)
Follow up visit in 3 months. 

## 2016-11-02 DIAGNOSIS — E109 Type 1 diabetes mellitus without complications: Secondary | ICD-10-CM | POA: Diagnosis not present

## 2016-11-02 DIAGNOSIS — E1065 Type 1 diabetes mellitus with hyperglycemia: Secondary | ICD-10-CM | POA: Diagnosis not present

## 2016-11-02 DIAGNOSIS — E108 Type 1 diabetes mellitus with unspecified complications: Secondary | ICD-10-CM | POA: Diagnosis not present

## 2016-11-02 DIAGNOSIS — Z794 Long term (current) use of insulin: Secondary | ICD-10-CM | POA: Diagnosis not present

## 2016-11-08 DIAGNOSIS — F4320 Adjustment disorder, unspecified: Secondary | ICD-10-CM | POA: Diagnosis not present

## 2017-01-31 ENCOUNTER — Encounter (INDEPENDENT_AMBULATORY_CARE_PROVIDER_SITE_OTHER): Payer: Self-pay | Admitting: "Endocrinology

## 2017-01-31 ENCOUNTER — Ambulatory Visit (INDEPENDENT_AMBULATORY_CARE_PROVIDER_SITE_OTHER): Payer: BLUE CROSS/BLUE SHIELD | Admitting: "Endocrinology

## 2017-01-31 VITALS — BP 110/80 | HR 84 | Ht 66.93 in | Wt 144.6 lb

## 2017-01-31 DIAGNOSIS — E11649 Type 2 diabetes mellitus with hypoglycemia without coma: Secondary | ICD-10-CM | POA: Diagnosis not present

## 2017-01-31 DIAGNOSIS — E049 Nontoxic goiter, unspecified: Secondary | ICD-10-CM

## 2017-01-31 DIAGNOSIS — E1042 Type 1 diabetes mellitus with diabetic polyneuropathy: Secondary | ICD-10-CM | POA: Diagnosis not present

## 2017-01-31 DIAGNOSIS — I4711 Inappropriate sinus tachycardia, so stated: Secondary | ICD-10-CM

## 2017-01-31 DIAGNOSIS — E1043 Type 1 diabetes mellitus with diabetic autonomic (poly)neuropathy: Secondary | ICD-10-CM | POA: Diagnosis not present

## 2017-01-31 DIAGNOSIS — F330 Major depressive disorder, recurrent, mild: Secondary | ICD-10-CM | POA: Diagnosis not present

## 2017-01-31 DIAGNOSIS — IMO0001 Reserved for inherently not codable concepts without codable children: Secondary | ICD-10-CM

## 2017-01-31 DIAGNOSIS — E038 Other specified hypothyroidism: Secondary | ICD-10-CM

## 2017-01-31 DIAGNOSIS — Z9119 Patient's noncompliance with other medical treatment and regimen: Secondary | ICD-10-CM

## 2017-01-31 DIAGNOSIS — I10 Essential (primary) hypertension: Secondary | ICD-10-CM

## 2017-01-31 DIAGNOSIS — R000 Tachycardia, unspecified: Secondary | ICD-10-CM | POA: Diagnosis not present

## 2017-01-31 DIAGNOSIS — Z91199 Patient's noncompliance with other medical treatment and regimen due to unspecified reason: Secondary | ICD-10-CM

## 2017-01-31 DIAGNOSIS — E063 Autoimmune thyroiditis: Secondary | ICD-10-CM | POA: Diagnosis not present

## 2017-01-31 DIAGNOSIS — E1065 Type 1 diabetes mellitus with hyperglycemia: Secondary | ICD-10-CM | POA: Diagnosis not present

## 2017-01-31 LAB — POCT GLYCOSYLATED HEMOGLOBIN (HGB A1C): Hemoglobin A1C: 7.6

## 2017-01-31 LAB — GLUCOSE, POCT (MANUAL RESULT ENTRY): POC GLUCOSE: 341 mg/dL — AB (ref 70–99)

## 2017-01-31 NOTE — Progress Notes (Signed)
Subjective:  Patient Name: Scott Moran Date of Birth: Apr 19, 1991  MRN: [id number]  Scott Moran  presents to the office today for follow-up of his type 1 diabetes mellitus, goiter, hypoglycemia, depression, social anxiety disorder, avoidant personality disorder, hypothyroidism, thyroiditis, diabetic autonomic neuropathy, inappropriate sinus tachycardia, non-proliferative diabetic retinopathy, diabetic peripheral neuropathy, fatigue, hypogonadotropic hypogonadism, and medical non-compliance.  HISTORY OF PRESENT ILLNESS:   Scott Moran is a 26 y.o. Caucasian young man. Scott Moran was unaccompanied.   1. I first saw the patient in consultation on 03/27/05 in the [facility] in [place], [place]. He had been referred by his primary care provider, [name], in [place], [place], for evaluation and treatment of poorly controlled type 1 diabetes. The patient was 26 years old.  A. The patient had been diagnosed with type 1 diabetes in February of 1995 at age 26. He was on an insulin pump when I first met him. His hemoglobin A1c had increased from 7.1% in December of 2005 to 8.2% in April 2006. He was having frequent snacks that he was not taking boluses for. He was also frequently noncompliant with checking blood sugars and taking meal boluses. I adjusted his basal rates.  B. At his next [place] visit on 06/19/05, he was still very noncompliant. His mental status was normal. He had a 25 gram goiter. His father was serving in [place] with the [employer]. There was a lot of stress at home. I again adjusted his basal rates.  C. At the end of July 2006 we closed our satellite office in [place]. The patient elected to be followed at our clinic here in [place]. I have followed him here ever since.  2. During the past eleven years, the patient has had several clinical issues.   A. T1DM: The patient's blood glucose control has usually been fairly good for a  teenager. His maximum hemoglobin A1c occurred on 04/16/06 when the hemoglobin A1c was 12.1%. Since then the hemoglobin A1c values have varied between 6.9% and 8.6%. He is still fairly noncompliant with checking blood sugars, bolusing, and changing his insulin pump sites on time, but he is more compliant and more careful than he used to be. He has not required hospital re-admission for DKA, severe hyperglycemia, or hypoglycemia.   B. Goiter, Hashimoto's thyroiditis, and hypothyroidism: In late 2008 and early 2009 the patient developed hypothyroidism secondary to Hashimoto's disease. I started him on Synthroid, 25 mcg per day. When he takes his medication, he is euthyroid.  C. Autonomic neuropathy and inappropriate sinus tachycardia: In late 2008 he developed autonomic neuropathy which manifested as inappropriate sinus tachycardia. These problems varied in parallel with his BGs and HbA1c values.  D. Diabetic peripheral neuropathy: The patient has had symptoms and signs of mild diabetic peripheral neuropathy at several times in the past. His peripheral neuropathic signs and symptoms similarly varied in parallel with his BGs and HbA1c values.   E. Non-proliferative diabetic retinopathy: This problem was identified during a diabetic eye exam in June 2010.  F. Hypogonadotropic hypogonadism: The patient has always been somewhat lackadaisical and relatively passive. On 06/29/10 his FSH was 4.9, LH was 2.4, testosterone was 399.66, and free testosterone was 79.8. The testosterone and free testosterone were relatively low for an 26 year old young man. At the same time the [facility] and LH while "normal", appeared to be actually inappropriately low for the level of testosterone he had. We discussed the options for treatment including exercise and medication, to include AndroGel. The patient  chose the exercise option. By 10/19/10 the [facility] was 6.8, the LH was 3.2, total testosterone was 615.88 and the free testosterone was 131.6. In  retrospect, when he was more depressed he may have had less gonadotropic function. We will continue to follow up on this issue over time.  G. Depression/Anxiety/Personality disorders:    1). The patient has been diagnosed with depression at different times in the past. For some time he took Remeron (mirtazapine), but discontinued that medication in early 2012. I had been trying to convince him for some time that he was depressed and that he should seek out help. He eventually did decide to seek psychiatric help.    2). On 06/08/13 Scott Moran was evaluated by [name], a noted psychiatrist in [place].  [name] and his staff performed neuropsychiatric testing. On the [place]EVEL had problems with non-verbal reasoning and emotional intelligence. 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 [name] prescribed escitalopram, 10 mg/day. Daltyn was supposed to continue to see [name] in follow up. Unfortunately, Jaquavion did not follow up.    3). Scott Moran was then referred to a therapist, [name]. Abenezer enjoys working with Dr. [name] and feels that he is making progress.    H. 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   3. The patient's last [facility] visit was on 10/30/16. He was supposed to call me in 4 weeks to review his BGs, but he did not. In the interim, he has been healthy.   A. In late August he transitioned to the Medtronic 670G pump and Guardian 3 sensor. He is afraid to use the sensor because he is afraid of messing up the sensor, so he does not use the sensor very often. Once again he did not call us or [employer]. When I asked him why he had not pursued either of these courses of action, he replied that he is just afraid of screwing up.     Scott Moran has not seen [name], his  therapist recently. Mom does not think the therapy is really helping Scott Moran. Scott Moran thinks that the ability to discuss his concerns and issues with Dr. Danise Moran is helping him. Scott Moran continues to take on-line classes at [employer].  Scott Moran is very concerned about what he will do in the future and how to take the next step forward. He is stuck in the midst of his own thoughts and fears.   C. His sleeps for 4-5 hours, then is up for several hours, then goes back to bed for several more hours.  Scott Moran takes his Synthroid dose of 25 mcg/day every night. He is not taking lisinopril and his escitalopram very often.   E. He now has his learner's permit and is learning to drive. He does not feel ready yet to drive on his own.    4. Pertinent Review of Systems:  Constitutional: Scott Moran feels "tired". He remains fairly depressed and emotionally exhausted. His allergies are not acting up. He has not been getting out of the house very often. He still spends many hours on the web.  Eyes: Vision is good with his glasses. There are no significant eye complaints. Last eye exam was in November 2017. There were no signs of diabetic retinopathy. Mouth: No problems.  Neck: The patient has no complaints of anterior neck  swelling, soreness, tenderness,  pressure, discomfort, or difficulty swallowing.  Heart: Heart rate increases with exercise or other physical activity. The patient has no complaints of palpitations, irregular heat beats, chest pain, or chest pressure. Gastrointestinal: No postprandial bloating. Bowel movents seem normal. The patient has no other complaints of excessive hunger, acid reflux, upset stomach, stomach aches or pains, diarrhea, or constipation. Legs: Muscle mass and strength seem normal. There are no complaints of numbness, tingling, burning, or pain. No edema is noted. Feet: There are no obvious foot problems. There are no complaints of numbness, tingling, burning, or pain. No edema is  noted. Hypoglycemia: He has had a few low BGs recently when he wakes up between noon-3 PM. None were severe.   Emotional/psychological: The patient feels "depressed". He is still unwilling to take his lisinopril and escitalopram. .  5. BG printout: We only have BG data since 01/17/17. He is changing his sites every 5-6 days. He is checking BGs 1-7 times per day. He boluses 2-5 times per day. Average BG is 195, compared with 240 at his last visit. BG range is 50 to >400, compared with 55 to >400 at his last visit and with 196 to >4000 at the prior visit. The  longer he leaves sites in and the longer he goes between BG checks and insulin boluses, the more variability in his BGs. Most of his low BGs occur between 11:30 AM and 3 PM when he sleeps in late..  6. Sensor printout: none   PAST MEDICAL, FAMILY, AND SOCIAL HISTORY:  Past Medical History:  Diagnosis Date  . DM type 1 with diabetic peripheral neuropathy ([place])   . Goiter   . Hypertension   . Hypoglycemia associated with diabetes ([place])   . Hypogonadotropic hypogonadism ([place])   . Hypothyroidism, acquired, autoimmune   . Tachycardia   . Thyroiditis, autoimmune   . Type 1 diabetes mellitus not at goal [facility])   . Type 1 diabetes mellitus with diabetic autonomic neuropathy ([facility])     Family History  Problem Relation Age of Onset  . Diabetes Maternal Uncle   . Hypertension Maternal Grandmother   . Thyroid disease Neg Hx   . Obesity Neg Hx   . Kidney disease Neg Hx   . Cancer Neg Hx   . Anemia Neg Hx      Current Outpatient Prescriptions:  .  acetone, urine, test strip, Check ketones per protocol, Disp: 50 each, Rfl: 3 .  glucagon (GLUCAGON EMERGENCY) 1 MG injection, Inject 1 mg intramuscularly in thigh 1 time for severe hypoglycemia if unresponsive, unconscious, unable to swallow or has a seizure., Disp: 2 kit, Rfl: 3 .  glucose blood (BAYER CONTOUR NEXT TEST) test strip, Check sugar 10 x daily, Disp: 300 each, Rfl: 3 .  insulin  aspart (NOVOLOG) 100 UNIT/ML injection, USE 300 UNITS IN PUMP EVERY 48-72 HOURS AND PER PROTOCOL FOR HYPERGLYCEMIA AND DKA OUTPATIENT TREATMENT., Disp: 120 mL, Rfl: 5 .  levothyroxine (SYNTHROID, LEVOTHROID) 25 MCG tablet, TAKE 1 TABLET BY MOUTH DAILY BEFORE BREAKFAST., Disp: 30 tablet, Rfl: 5 .  escitalopram (LEXAPRO) 10 MG tablet, Take 1 tablet (10 mg total) by mouth daily. (Patient not taking: Reported on 01/31/2017), Disp: 90 tablet, Rfl: 3 .  ondansetron (ZOFRAN ODT) 4 MG disintegrating tablet, Take 1 tablet (4 mg total) by mouth every 8 (eight) hours as needed for nausea or vomiting. (Patient not taking: Reported on 10/30/2016), Disp: 20 tablet, Rfl: 0 .  promethazine (PHENERGAN) 25 MG tablet, Take  1 tablet (25 mg total) by mouth every 6 (six) hours as needed for nausea or vomiting. (Patient not taking: Reported on 10/30/2016), Disp: 20 tablet, Rfl: 0  Allergies as of 01/31/2017  . (No Known Allergies)    1. Work and Family: Patient re-started courses at [employer] on 07/16/16.  2. Activities: Few physical activities. He is a history buff and frequently participates in online forum discussions. 3. Smoking, alcohol, or drugs: None 4. Primary Care Provider: He no longer has a primary care provider.  REVIEW OF SYSTEMS: There are no other significant problems involving Scott Moran's other body systems.   Objective:  Vital Signs:  BP 110/80   Pulse 84   Ht 5' 6.93" (1.7 m)   Wt 144 lb 9.6 oz (65.6 kg)   BMI 22.70 kg/m  HR 1`   Ht Readings from Last 3 Encounters:  01/31/17 5' 6.93" (1.7 m)  09/15/15 _0  (1.753 m)  08/22/13 _1  (1.702 m)   Wt Readings from Last 3 Encounters:  01/31/17 144 lb 9.6 oz (65.6 kg)  10/30/16 141 lb 3.2 oz (64 kg)  08/14/16 139 lb 3.2 oz (63.1 kg)   PHYSICAL EXAM:  Constitutional: Scott Moran was alert and cognitively normal today, but his affect was depressed. His insight is fair, but his ability to control his own thoughts and actions is very poor. He has gained  another 2.5 pounds. He is quieter today, but still fairly chatty. His flow of thoughts and speech fluency are normal. His comments are rational and lucid. He is very intelligent, but can't make decisions and move forward when he is depressed. He has been more consistent in his DM care this past month.  Face: The face appears normal.  Eyes: There is no obvious arcus or proptosis. Moisture appears normal. Mouth: The oropharynx and tongue appear normal. Oral moisture is normal. Neck: The neck looks normal. No carotid bruits are noted. The thyroid gland is again mildly enlarged at 21 grams in size today. Both lobes are symmetrically enlarged. The consistency of the thyroid gland is normal today.The thyroid gland is not tender to palpation.  Lungs: The lungs are clear to auscultation. Air movement is good. Heart: Heart rate remains tachycardic. Heart rhythm is regular. Heart sounds S1 and S2 are normal. I did not appreciate any pathologic cardiac murmurs. Abdomen: The abdomen is normal in size, but larger. Bowel sounds are normal. There is no obvious hepatomegaly, splenomegaly, or other mass effect.  Arms: Muscle size and bulk are normal for age. Hands: There is no obvious tremor. Phalangeal and metacarpophalangeal joints are normal. Palmar muscles are normal. Palmar skin is normal. Palmar moisture is also normal. Legs: Muscles appear normal for age. No edema is present. Feet: Feet are normally formed. Dorsalis pedal pulses are 1+ bilaterally.   Neurologic: Strength is normal for age in both the upper and lower extremities. Muscle tone is normal. Sensation to touch is normal in both legs, but slightly decreased to touch in the left heel.     LAB DATA:   Recent Results (from the past 504 hour(s))  POCT Glucose (CBG)   Collection Time: 01/31/17  1:55 PM  Result Value Ref Range   POC Glucose 341 (A) 70 - 99 mg/dl  POCT HgB A1C   Collection Time: 01/31/17  2:02 PM  Result Value Ref Range   Hemoglobin  A1C 7.6    Labs 01/31/17: HbA1c 7.6%, CBG 341  Labs 10/30/16: HbA1c 7.5%  Labs 07/25/16: HbA1c 7.8%  Labs  04/25/16: HbA1c 7.7%  Labs 04/09/16: LH 3.4, FSH 6.0, testosterone 504, free testosterone 85.5 (ref 47-244); cholesterol 176, triglycerides 92, HDL 80, LDL 88; CMP norma except for glucose 224; TSH 1.75, free T4 1.2, free T3 3.2; microalbumin/creatinine ratio 4  Labs 01/24/16: HbA1c 8.6%  Labs 10/20/15: HbA1c 8.1%  Labs 09/15/15: BMP normal except for glucose of 143 and calcium 8.3.  Labs 07/20/15: HbA1c 8.2%  Labs 03/25/15: HbA1c 7.8%  Labs 12/02/14: HbA1c 8.5%;  LH 4.0, FSH 8.2, testosterone 621.97; CMP normal; TSH 2.01, free T4 1.21, free T3 3.6; cholesterol 155, triglycerides 46, HDL  55, LDL 91; microalbumin/creatinine ratio 6.6  Labs 03/30/14: TSH 1.935, free T4 1.12, free T3 3.2; CMP: normal except for glucose 203; testosterone 624, FSH 6.3, LH 2.4; urine microalbumin/creatinine ratio 6.4; cholesterol 163, triglycerides 55, HDL 64, LDL 80  Labs 08/23/13: TSH 1.139; CMP normal except for a glucose of 145; CBC: WBC 5.4, Hgb 12.7, Hct 34.7%   Assessment and Plan:   ASSESSMENT:  1-2. Type 1 diabetes mellitus/hypoglycemia:   A. Demari has done better at checking BGs and giving boluses. He has not used his sensor in the past month.  B. His BGs have generally been a bit lower this past month.  C. His hypoglycemias occur almost always in the noon-3 PM time after sleeping in late.  3. Autonomic neuropathy and inappropriate sinus tachycardia: These problems are somewhat better, c/w him having had fewer BGs >400. Fortunately, these problems will reverse with better BG control.   4. Peripheral neuropathy: This problem is about the same as at his last visit.   5. Hypothyroid: He was euthyroid in December 2015 on his current dose of Synthroid. He was again euthyroid in May 2017. 6. Hypogonadotropic hypogonadism: His testosterone level in May 2017 was lower than in December 2015.  Depression still seems to be a major factor in his testosterone levels.  7. Hypertension: His BPs are somewhat higher. He needs to take his lisinopril regularly.   8. Goiter/Hashimoto's disease: His goiter is again mildly enlarged today. The pattern of waxing and waning of thyroid gland size is c/w evolving thyroiditis. Thyroiditis is clinically quiescent 9. Depression: This problem is worse today. He needs to see Dr. [name], but also needs to take his escitalopram. He still can't consistently make himself do what he needs to do. I again encouraged him to take his escitalopram and make a contract with mom, that if he takes the escitalopram, she will allow him to see Dr. [name].   10. Noncompliance: He was doing better immediately after starting the new sensor-pump combination, but has not done as well in the past month. I still continue to hope that as he ages and matures, he will do a better job of taking care of his T1DM and his life. The fact that he is continuing his college studies is a good prognostic sign.  11. Weight loss, unintentional: This problem has resolved.       PLAN:  1. Diagnostic: HbA1c today. Reviewed the results of his annual surveillance labs in May. Call in 4 weeks to discuss BGs.  2. Therapeutic: Continue Synthroid. Resume lisinopril and escitalopram. Give both correction boluses and food boluses. See our diabetes educator again about using his sensor. Since he won't use temporary basal rates when he sleeps in, the common sense action is to change his basal rates. New basal rates:  MN: 0.800 5 AM: 0.800 8 AM: 0.800 -> 0.650 Noon: 1.10 ->0.900 6 PM: 1.600  9 PM: 1.300 3. Patient education: Scott Moran is a very intelligent young man who knows intellectually what he should do and how to do it. Unfortunately, his commitment to positive actions steps is lacking. He is mired in his own depression and personality disorders. 4. Follow-up: 2 months  Level of Service: This visit lasted in  excess of 55 minutes. More than 50% of the visit was devoted to counseling.  [name] Sers, MD, CDE Adult and Pediatric Endocrinology 01/31/2017 2:19 PM

## 2017-01-31 NOTE — Patient Instructions (Signed)
Follow up visit in 2 months. Please call Gearldine BienenstockLorena Ibarra for follow up pump-sensor education.

## 2017-02-12 ENCOUNTER — Ambulatory Visit (INDEPENDENT_AMBULATORY_CARE_PROVIDER_SITE_OTHER): Payer: BLUE CROSS/BLUE SHIELD | Admitting: *Deleted

## 2017-02-12 VITALS — BP 120/80 | Wt 144.0 lb

## 2017-02-12 DIAGNOSIS — IMO0001 Reserved for inherently not codable concepts without codable children: Secondary | ICD-10-CM

## 2017-02-12 DIAGNOSIS — E1065 Type 1 diabetes mellitus with hyperglycemia: Secondary | ICD-10-CM | POA: Diagnosis not present

## 2017-02-12 LAB — GLUCOSE, POCT (MANUAL RESULT ENTRY): POC Glucose: 321 mg/dl — AB (ref 70–99)

## 2017-02-12 NOTE — Progress Notes (Signed)
Guardian Sensor start.  Scott Moran was here to start on his Guardian sensor with the 670G insulin pump. He had started back in September but stopped using them because he was loosing signal between the transmitter and the pump. He was getting frustrated. Has not had other problems using the sensors or the insulin pump.  Advised to try on a different side like the back of his arm.  Sensor settings as before: High Alerts 12a-12a 300 mg/dL Alert on high  On Snooze On 1.5 hours  Low Alert  12a-12a 70 mg/ dL Alert on low  On  Snooze On 20 mins  Patient was able to start sensor with no problem.  Advise to call our office if any questions or concerns regarding his sensor or diabetes.  Patient was able to verbalize the auto mode start once sensor is active.

## 2017-02-25 ENCOUNTER — Other Ambulatory Visit (INDEPENDENT_AMBULATORY_CARE_PROVIDER_SITE_OTHER): Payer: Self-pay | Admitting: *Deleted

## 2017-02-25 DIAGNOSIS — E1065 Type 1 diabetes mellitus with hyperglycemia: Principal | ICD-10-CM

## 2017-02-25 DIAGNOSIS — IMO0001 Reserved for inherently not codable concepts without codable children: Secondary | ICD-10-CM

## 2017-02-25 MED ORDER — INSULIN GLULISINE 100 UNIT/ML IJ SOLN: INTRAMUSCULAR | 6 refills | Status: DC

## 2017-03-07 DIAGNOSIS — F4320 Adjustment disorder, unspecified: Secondary | ICD-10-CM | POA: Diagnosis not present

## 2017-03-28 DIAGNOSIS — F4320 Adjustment disorder, unspecified: Secondary | ICD-10-CM | POA: Diagnosis not present

## 2017-04-03 ENCOUNTER — Encounter (INDEPENDENT_AMBULATORY_CARE_PROVIDER_SITE_OTHER): Payer: Self-pay | Admitting: "Endocrinology

## 2017-04-03 ENCOUNTER — Telehealth (INDEPENDENT_AMBULATORY_CARE_PROVIDER_SITE_OTHER): Payer: Self-pay | Admitting: "Endocrinology

## 2017-04-03 ENCOUNTER — Ambulatory Visit (INDEPENDENT_AMBULATORY_CARE_PROVIDER_SITE_OTHER): Payer: BLUE CROSS/BLUE SHIELD | Admitting: "Endocrinology

## 2017-04-03 VITALS — BP 112/60 | HR 90 | Wt 144.0 lb

## 2017-04-03 DIAGNOSIS — E063 Autoimmune thyroiditis: Secondary | ICD-10-CM | POA: Diagnosis not present

## 2017-04-03 DIAGNOSIS — R000 Tachycardia, unspecified: Secondary | ICD-10-CM

## 2017-04-03 DIAGNOSIS — E0843 Diabetes mellitus due to underlying condition with diabetic autonomic (poly)neuropathy: Secondary | ICD-10-CM

## 2017-04-03 DIAGNOSIS — E1042 Type 1 diabetes mellitus with diabetic polyneuropathy: Secondary | ICD-10-CM

## 2017-04-03 DIAGNOSIS — E049 Nontoxic goiter, unspecified: Secondary | ICD-10-CM | POA: Diagnosis not present

## 2017-04-03 DIAGNOSIS — F32A Depression, unspecified: Secondary | ICD-10-CM

## 2017-04-03 DIAGNOSIS — E291 Testicular hypofunction: Secondary | ICD-10-CM

## 2017-04-03 DIAGNOSIS — I4711 Inappropriate sinus tachycardia, so stated: Secondary | ICD-10-CM

## 2017-04-03 DIAGNOSIS — E1065 Type 1 diabetes mellitus with hyperglycemia: Secondary | ICD-10-CM

## 2017-04-03 DIAGNOSIS — F3289 Other specified depressive episodes: Secondary | ICD-10-CM | POA: Diagnosis not present

## 2017-04-03 DIAGNOSIS — Z794 Long term (current) use of insulin: Secondary | ICD-10-CM

## 2017-04-03 DIAGNOSIS — E10649 Type 1 diabetes mellitus with hypoglycemia without coma: Secondary | ICD-10-CM | POA: Diagnosis not present

## 2017-04-03 DIAGNOSIS — F329 Major depressive disorder, single episode, unspecified: Secondary | ICD-10-CM | POA: Diagnosis not present

## 2017-04-03 DIAGNOSIS — IMO0001 Reserved for inherently not codable concepts without codable children: Secondary | ICD-10-CM

## 2017-04-03 LAB — POCT GLUCOSE (DEVICE FOR HOME USE): POC Glucose: 182 mg/dl — AB (ref 70–99)

## 2017-04-03 MED ORDER — ESCITALOPRAM OXALATE 10 MG PO TABS: ORAL_TABLET | ORAL | 2 refills | Status: DC

## 2017-04-03 NOTE — Progress Notes (Signed)
Subjective:  Patient Name: Scott Moran Date of Birth: May 20, 1991  MRN: [id number]  Scott Moran  presents to the office today for follow-up of his type 1 diabetes mellitus, goiter, hypoglycemia, depression, social anxiety disorder, avoidant personality disorder, hypothyroidism, thyroiditis, diabetic autonomic neuropathy, inappropriate sinus tachycardia, non-proliferative diabetic retinopathy, diabetic peripheral neuropathy, fatigue, hypogonadotropic hypogonadism, and medical non-compliance.  HISTORY OF PRESENT ILLNESS:   Scott Moran is a 26 y.o. Caucasian young man. Scott Moran was unaccompanied.   1. I first saw the patient in consultation on 03/27/05 in the [facility] in [place], [name]. He had been referred by his primary care provider, [name], in [place], [place], for evaluation and treatment of poorly controlled type 1 diabetes. The patient was 26 years old.  A. The patient had been diagnosed with type 1 diabetes in February of 1995 at age 13. He was on an insulin pump when I first met him. His hemoglobin A1c had increased from 7.1% in December of 2005 to 8.2% in April 2006. He was having frequent snacks that he was not taking boluses for. He was also frequently noncompliant with checking blood sugars and taking meal boluses. I adjusted his basal rates.  B. At his next [name] visit on 06/19/05, he was still very noncompliant. His mental status was normal. He had a 25 gram goiter. His father was serving in [place] with the [employer]. There was a lot of stress at home. I again adjusted his basal rates.  C. At the end of July 2006 we closed our satellite office in [place]. The patient elected to be followed at our clinic here in [place]. I have followed him here ever since.  2. During the past twelve years, the patient has had several clinical issues.   A. T1DM: The patient's blood glucose control has usually been fairly good for a  teenager/young adult. His maximum hemoglobin A1c occurred on 04/16/06 when the hemoglobin A1c was 26.1%. Since then the hemoglobin A1c values have varied between 6.9% and 8.6%. He is still fairly noncompliant with checking blood sugars, bolusing, and changing his insulin pump sites on time, but he is more compliant and more careful than he used to be. He has never required hospital re-admission for DKA, severe hyperglycemia, or hypoglycemia.   B. Goiter, Hashimoto's thyroiditis, and hypothyroidism: In late 2008 and early 2009 the patient developed hypothyroidism secondary to Hashimoto's disease. I started him on Synthroid, 25 mcg per day. When he takes his medication, he is euthyroid.  C. Autonomic neuropathy and inappropriate sinus tachycardia: In late 2008 he developed autonomic neuropathy which manifested as inappropriate sinus tachycardia. These problems varied in parallel with his BGs and HbA1c values.  D. Diabetic peripheral neuropathy: The patient has had symptoms and signs of mild diabetic peripheral neuropathy at several times in the past. His peripheral neuropathic signs and symptoms similarly varied in parallel with his BGs and HbA1c values.   E. Non-proliferative diabetic retinopathy: This problem was identified during a diabetic eye exam in June 2010.  F. Hypogonadotropic hypogonadism: The patient has always been somewhat lackadaisical and relatively passive. On 06/29/10 his FSH was 4.9, LH was 2.4, testosterone was 399.66, and free testosterone was 79.8. The testosterone and free testosterone were relatively low for an 26 year old young man. At the same time the [facility] and LH while "normal", appeared to be actually inappropriately low for the level of testosterone he had. We discussed the options for treatment including exercise and medication, to include AndroGel. The  patient chose the exercise option. By 10/19/10 the [facility] was 6.8, the LH was 3.2, total testosterone was 615.88 and the free testosterone  was 131.6. In retrospect, when he was more depressed he may have had less gonadotropic function. We will continue to follow up on this issue over time.  G. Depression/Anxiety/Personality disorders:    1). The patient has been diagnosed with depression at different times in the past. For some time he took Remeron (mirtazapine), but discontinued that medication in early 2012. I had been trying to convince him for some time that he was depressed and that he should seek out help. He eventually did decide to seek psychiatric help.    2). On 06/08/13 [name] was evaluated by [name], a noted psychiatrist in [place].  [name] and his staff performed neuropsychiatric testing. On the PEARSE SHIFFLER had problems with non-verbal reasoning and emotional intelligence. 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 [name] prescribed escitalopram, 10 mg/day. Leontae was supposed to continue to see [name] in follow up. Unfortunately, [name] did not follow up.    3). [name] was then referred to a therapist, [name]. [name] enjoys working with Dr. [name] and feels that he is making progress.    H. 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   3. The patient's last [facility] visit was on 01/31/17. At that visit we decreased his basal rates at 8 AM and at noon.  He was supposed to call me in 4 weeks to review his BGs, but he did not. In the interim, he has been healthy.   A. In late August 2017 he transitioned to the [employer]onic 670G pump and Guardian 3 sensor. His sensor worked well for about the first week after his last visit, but then he had problems with it. He did not call [employer] and has not been using the sensor. He will see [name] today for assistance. He has not been receiving re-supply of his  sensors from [facility], but he has not called [facility] to try to remedy the problem.      [name] has seen [name], his therapist, recently. Dr. Lorie Apley practice can't order anti-depressants for him. Victorio continues to take on-line classes at [employer].  Scott Moran and Dr. [name] are concerned about what Verdie will do in the future and how to take the next step forward. Unfortunately, Kelyn is stuck in the quagmire of his own self-deprecatory thoughts and fears.   C. His sleeps for 8-9 hours. He occasionally takes a nap during the day.   Juanda Bond takes his Synthroid dose of 25 mcg/day every night. He is not taking lisinopril and escitalopram.   E. He now has his learner's permit and is learning to drive. He does not feel ready yet to drive on his own. "I don't want to mess up." He is also severely afraid of other drivers that are terrible.   4. Pertinent Review of Systems:  Constitutional: Michaelanthony feels "pretty okay". He feels that he is not as depressed as he used to be and is more optimistic about the future of the [place]. His allergies are not acting up too much. He has not been getting out of the house very often. He still spends a very large amount of his waking hours on the Internet.  Eyes: Vision  is good with his glasses. There are no significant eye complaints. Last eye exam was in November 2017. There were no signs of diabetic retinopathy. Mouth: No problems.  Neck: The patient has no complaints of anterior neck swelling, soreness, tenderness,  pressure, discomfort, or difficulty swallowing.  Heart: Heart rate increases with exercise or other physical activity. The patient has no complaints of palpitations, irregular heat beats, chest pain, or chest pressure. Gastrointestinal: No postprandial bloating. Bowel movents seem normal. The patient has no other complaints of excessive hunger, acid reflux, upset stomach, stomach aches or pains, diarrhea, or constipation. Legs: Muscle mass and  strength seem normal. There are no complaints of numbness, tingling, burning, or pain. No edema is noted. Feet: There are no obvious foot problems. There are no complaints of numbness, tingling, burning, or pain. No edema is noted. Hypoglycemia: He has had a few low BGs recently, sometimes when he wakes up.. None were severe.   Emotional/psychological: The patient feels "not as depressed". He is still unwilling to take his lisinopril, but does want me to prescribe escitalopram for him. .  5. BG printout: We only have pump data from 03/21/17 to the present. He is changing his sites every 5-7 days. He is checking BGs 1-5 times per day, typically 2-3 times per day, but he sometimes goes for 24 hours between BG checks and often does not check BGs at bedtime. He boluses 3-6 times per day, but many of the boluses are only food boluses. Average BG is 244, compared with 190 at his last visit and with 240 at the prior visit. BG range is 52 to >400, compared with 50 to >400 at his last visit and with 52 to >4000 at the prior visit. The  longer he leaves sites in and the longer he goes between BG checks and insulin boluses, the more variable his BGs will be. He had two low  BGs in the past 2 weeks, one at 1 AM and one upon awakening.   6. Sensor printout: None   PAST MEDICAL, FAMILY, AND SOCIAL HISTORY:  Past Medical History:  Diagnosis Date  . DM type 1 with diabetic peripheral neuropathy ([place])   . Goiter   . Hypertension   . Hypoglycemia associated with diabetes ([place])   . Hypogonadotropic hypogonadism ([place])   . Hypothyroidism, acquired, autoimmune   . Tachycardia   . Thyroiditis, autoimmune   . Type 1 diabetes mellitus not at goal [facility])   . Type 1 diabetes mellitus with diabetic autonomic neuropathy ([facility])     Family History  Problem Relation Age of Onset  . Diabetes Maternal Uncle   . Hypertension Maternal Grandmother   . Thyroid disease Neg Hx   . Obesity Neg Hx   . Kidney disease Neg Hx   .  Cancer Neg Hx   . Anemia Neg Hx      Current Outpatient Prescriptions:  .  acetone, urine, test strip, Check ketones per protocol, Disp: 50 each, Rfl: 3 .  glucagon (GLUCAGON EMERGENCY) 1 MG injection, Inject 1 mg intramuscularly in thigh 1 time for severe hypoglycemia if unresponsive, unconscious, unable to swallow or has a seizure., Disp: 2 kit, Rfl: 3 .  glucose blood (BAYER CONTOUR NEXT TEST) test strip, Check sugar 10 x daily, Disp: 300 each, Rfl: 3 .  insulin aspart (NOVOLOG) 100 UNIT/ML injection, USE 300 UNITS IN PUMP EVERY 48-72 HOURS AND PER PROTOCOL FOR HYPERGLYCEMIA AND DKA OUTPATIENT TREATMENT., Disp: 120 mL, Rfl: 5 .  levothyroxine (  SYNTHROID, LEVOTHROID) 25 MCG tablet, TAKE 1 TABLET BY MOUTH DAILY BEFORE BREAKFAST., Disp: 30 tablet, Rfl: 5 .  escitalopram (LEXAPRO) 10 MG tablet, Take 1 tablet (10 mg total) by mouth daily. (Patient not taking: Reported on 01/31/2017), Disp: 90 tablet, Rfl: 3 .  ondansetron (ZOFRAN ODT) 4 MG disintegrating tablet, Take 1 tablet (4 mg total) by mouth every 8 (eight) hours as needed for nausea or vomiting. (Patient not taking: Reported on 10/30/2016), Disp: 20 tablet, Rfl: 0 .  promethazine (PHENERGAN) 25 MG tablet, Take 1 tablet (25 mg total) by mouth every 6 (six) hours as needed for nausea or vomiting. (Patient not taking: Reported on 10/30/2016), Disp: 20 tablet, Rfl: 0  Allergies as of 04/03/2017  . (No Known Allergies)    1. Work and Family: Patient re-started on-line courses at [employer] on 07/16/16.  2. Activities: Few physical activities. He is a history buff and frequently participates in online forum discussions about historical and political topics. 3. Smoking, alcohol, or drugs: None 4. Primary Care Provider: He no longer has a primary care provider. 5. Psychiatry: He no longer has a psychiatrist.   REVIEW OF SYSTEMS: There are no other significant problems involving Camar's other body systems.   Objective:  Vital Signs:  BP 112/60    Pulse 90   Wt 144 lb (65.3 kg)   BMI 22.60 kg/m  HR 1`   Ht Readings from Last 3 Encounters:  01/31/17 5' 6.93" (1.7 m)  09/15/15 _0  (1.753 m)  08/22/13 _1  (1.702 m)   Wt Readings from Last 3 Encounters:  04/03/17 144 lb (65.3 kg)  02/12/17 144 lb (65.3 kg)  01/31/17 144 lb 9.6 oz (65.6 kg)   PHYSICAL EXAM: Constitutional: Naasir was alert and cognitively normal today. His affect was fairly normal today. His insight is fair. His ability to control his own thoughts and actions is better today. His weight is unchanged since last visit. He is fairly chatty. His flow of thoughts and speech fluency are normal. His comments are rational and lucid. He is very intelligent, but still can't make decisions and move forward when he is depressed. He has been inconsistent in his DM care again this past month. He still wants to spend most of the visit talking about politics and history. I had to re-direct him several times.  Face: The face appears normal.  Eyes: There is no obvious arcus or proptosis. Moisture appears normal. Mouth: The oropharynx and tongue appear normal. Oral moisture is normal. Neck: The neck looks normal. No carotid bruits are noted. The thyroid gland is again mildly enlarged at 21 grams in size today. Both lobes are symmetrically enlarged. The consistency of the thyroid gland is normal today.The thyroid gland is not tender to palpation.  Lungs: The lungs are clear to auscultation. Air movement is good. Heart: Heart rate remains tachycardic. Heart rhythm is regular. Heart sounds S1 and S2 are normal. I did not appreciate any pathologic cardiac murmurs. Abdomen: The abdomen is normal in size, but larger. Bowel sounds are normal. There is no obvious hepatomegaly, splenomegaly, or other mass effect.  Arms: Muscle size and bulk are normal for age. Hands: There is no obvious tremor. Phalangeal and metacarpophalangeal joints are normal. Palmar muscles are normal. Palmar skin is  normal. Palmar moisture is also normal. Legs: Muscles appear normal for age. No edema is present. Feet: Feet are normally formed. Dorsalis pedal pulses are 1+ bilaterally.   Neurologic: Strength is normal for age in both  the upper and lower extremities. Muscle tone is normal. Sensation to touch is normal in both legs, but slightly decreased to touch in the heels.     LAB DATA:   Results for orders placed or performed in visit on 04/03/17 (from the past 504 hour(s))  POCT Glucose (Device for Home Use)   Collection Time: 04/03/17  1:52 PM  Result Value Ref Range   Glucose Fasting, POC  70 - 99 mg/dL   POC Glucose 182 (A) 70 - 99 mg/dl   Labs 04/03/17: CBG 182  Labs 01/31/17: HbA1c 7.6%, CBG 341  Labs 10/30/16: HbA1c 7.5%  Labs 07/25/16: HbA1c 7.8%  Labs 04/25/16: HbA1c 7.7%  Labs 04/09/16: LH 3.4, FSH 6.0, testosterone 504, free testosterone 85.5 (ref 47-244); cholesterol 176, triglycerides 92, HDL 80, LDL 88; CMP norma except for glucose 224; TSH 1.75, free T4 1.2, free T3 3.2; microalbumin/creatinine ratio 4  Labs 01/24/16: HbA1c 8.6%  Labs 10/20/15: HbA1c 8.1%  Labs 09/15/15: BMP normal except for glucose of 143 and calcium 8.3.  Labs 07/20/15: HbA1c 8.2%  Labs 03/25/15: HbA1c 7.8%  Labs 12/02/14: HbA1c 8.5%;  LH 4.0, FSH 8.2, testosterone 621.97; CMP normal; TSH 2.01, free T4 1.21, free T3 3.6; cholesterol 155, triglycerides 46, HDL  55, LDL 91; microalbumin/creatinine ratio 6.6  Labs 03/30/14: TSH 1.935, free T4 1.12, free T3 3.2; CMP: normal except for glucose 203; testosterone 624, FSH 6.3, LH 2.4; urine microalbumin/creatinine ratio 6.4; cholesterol 163, triglycerides 55, HDL 64, LDL 80  Labs 08/23/13: TSH 1.139; CMP normal except for a glucose of 145; CBC: WBC 5.4, Hgb 12.7, Hct 34.7%   Assessment and Plan:   ASSESSMENT:  1-2. Type 1 diabetes mellitus:   A. Danta has continued to be inconsistent about checking BGs and giving boluses. He has not used his sensor in the  past month.  B. His BGs have generally been higher this past month.  C. He had two hypoglycemic BGs, one as he was going to bed and one in the morning. He remain inconsistent about checking his  BGs at bedtime.  3. Autonomic neuropathy and inappropriate sinus tachycardia: These problems are somewhat worse, c/w him having had more BGs >300. Fortunately, these problems will reverse with better BG control.   4. Peripheral neuropathy: This problem is slightly worse today.    5. Hypothyroid: He was euthyroid in December 2015 and again in Mat 2017 on his current dose of Synthroid.  6. Hypogonadotropic hypogonadism: His testosterone level in May 2017 was acceptable, but was lower than in December 2015. Depression still seems to be a major factor in his testosterone levels.  7. Hypertension: His BPs are better today, despite not taking his lisinopril. I would like him to take his lisinopril daily in order to protect his kidneys from the effects of high BGs. I have made this point to him before and he understands cognitively, but he does not act on the information.  8. Goiter/Hashimoto's disease: His goiter is again mildly enlarged today. The pattern of waxing and waning of thyroid gland size is c/w evolving thyroiditis. Thyroiditis is clinically quiescent 9. Depression: This problem is better today. He does not exhibit any suicidal thoughts or emotions. He needs to see Dr. [name] and to re-establish care with a psychiatrist, but also needs to take his escitalopram. Since he does not have a psychiatrist or a PCP to prescribe escitalopram, I will prescribe it for 30 days with 2 refills. That will allow him to find a psychiatrist  or PCP.   10. Noncompliance: He was doing better immediately after starting the new sensor-pump combination, but has not done as well in the past month. I still continue to hope that as he ages and matures, he will do a better job of taking care of his T1DM and his life. Perhaps the  escitalopram will help.  11. Weight loss, unintentional: This problem has resolved.       PLAN:  1. Diagnostic: CBG today. Annual surveillance labs soon. Call in 4 weeks to discuss BGs.  2. Therapeutic: Continue Synthroid. Resume lisinopril and escitalopram. Give both correction boluses and food boluses. See our diabetes educator again about using his sensor. Continue his current basal rates:  MN: 0.800 5 AM: 0.800 8 AM: 0.650 Noon: 0.900 6 PM: 1.600 9 PM: 1.300 3. Patient education: Gokul is a very intelligent young man who knows intellectually what he should do and how to do it. Unfortunately, he lacks the commitment to taking positive actions about his healthcare and about his life in general. He is mired in his own depression and personality disorders. 4. Follow-up: 2 months  Level of Service: This visit lasted in excess of 55 minutes. More than 50% of the visit was devoted to counseling.  [name] Sers, MD, CDE Adult and Pediatric Endocrinology 04/03/2017 2:14 PM

## 2017-04-03 NOTE — Patient Instructions (Signed)
Follow up visit in 2 months. Please obtain fasting lab tests within the next month.

## 2017-04-03 NOTE — Telephone Encounter (Signed)
°  Who's calling (name and relationship to patient) : Self Best contact number: [phone] Provider they see: Fransico Redford Reason for call: Requesting to speak to [facility]. He stated when he changes the battery in his pump it deletes his information.     PRESCRIPTION REFILL ONLY  Name of prescription:  Pharmacy:

## 2017-04-08 NOTE — Telephone Encounter (Signed)
Returned TC and spoke with dad Scott Moran. Advised that if he is still having this issue that pump deletes information he needs to call [employer] and have it replace. Dad said will relate info to patient.

## 2017-04-15 DIAGNOSIS — E108 Type 1 diabetes mellitus with unspecified complications: Secondary | ICD-10-CM | POA: Diagnosis not present

## 2017-04-15 DIAGNOSIS — E109 Type 1 diabetes mellitus without complications: Secondary | ICD-10-CM | POA: Diagnosis not present

## 2017-04-15 DIAGNOSIS — Z794 Long term (current) use of insulin: Secondary | ICD-10-CM | POA: Diagnosis not present

## 2017-04-18 DIAGNOSIS — F4320 Adjustment disorder, unspecified: Secondary | ICD-10-CM | POA: Diagnosis not present

## 2017-05-09 DIAGNOSIS — F4320 Adjustment disorder, unspecified: Secondary | ICD-10-CM | POA: Diagnosis not present

## 2017-05-18 ENCOUNTER — Other Ambulatory Visit (INDEPENDENT_AMBULATORY_CARE_PROVIDER_SITE_OTHER): Payer: Self-pay | Admitting: "Endocrinology

## 2017-05-18 DIAGNOSIS — E038 Other specified hypothyroidism: Secondary | ICD-10-CM

## 2017-05-29 DIAGNOSIS — F4320 Adjustment disorder, unspecified: Secondary | ICD-10-CM | POA: Diagnosis not present

## 2017-05-29 DIAGNOSIS — E063 Autoimmune thyroiditis: Secondary | ICD-10-CM | POA: Diagnosis not present

## 2017-05-29 DIAGNOSIS — E1065 Type 1 diabetes mellitus with hyperglycemia: Secondary | ICD-10-CM | POA: Diagnosis not present

## 2017-05-29 DIAGNOSIS — E291 Testicular hypofunction: Secondary | ICD-10-CM | POA: Diagnosis not present

## 2017-05-30 LAB — ESTRADIOL: Estradiol: 31 pg/mL (ref <=39)

## 2017-05-30 LAB — COMPREHENSIVE METABOLIC PANEL
ALBUMIN: 4.4 g/dL (ref 3.6–5.1)
ALT: 26 U/L (ref 9–46)
AST: 24 U/L (ref 10–40)
Alkaline Phosphatase: 69 U/L (ref 40–115)
BILIRUBIN TOTAL: 0.8 mg/dL (ref 0.2–1.2)
BUN: 7 mg/dL (ref 7–25)
CALCIUM: 9.3 mg/dL (ref 8.6–10.3)
CO2: 25 mmol/L (ref 20–31)
Chloride: 100 mmol/L (ref 98–110)
Creat: 0.87 mg/dL (ref 0.60–1.35)
Glucose, Bld: 310 mg/dL — ABNORMAL HIGH (ref 70–99)
Potassium: 5.8 mmol/L — ABNORMAL HIGH (ref 3.5–5.3)
Sodium: 136 mmol/L (ref 135–146)
Total Protein: 7.4 g/dL (ref 6.1–8.1)

## 2017-05-30 LAB — MICROALBUMIN / CREATININE URINE RATIO
CREATININE, URINE: 164 mg/dL (ref 20–370)
Microalb Creat Ratio: 4 mcg/mg creat (ref <30)
Microalb, Ur: 0.7 mg/dL

## 2017-05-30 LAB — T3, FREE: T3, Free: 3.4 pg/mL (ref 2.3–4.2)

## 2017-05-30 LAB — LIPID PANEL
CHOL/HDL RATIO: 3.2 ratio (ref <5.0)
Cholesterol: 187 mg/dL (ref <200)
HDL: 59 mg/dL (ref >40)
LDL CALC: 116 mg/dL — AB (ref <100)
Triglycerides: 62 mg/dL (ref <150)
VLDL: 12 mg/dL (ref <30)

## 2017-05-30 LAB — TESTOSTERONE TOTAL,FREE,BIO, MALES
ALBUMIN: 4.4 g/dL (ref 3.6–5.1)
SEX HORMONE BINDING: 40 nmol/L (ref 10–50)
TESTOSTERONE FREE: 76 pg/mL (ref 46.0–224.0)
TESTOSTERONE: 634 ng/dL (ref 250–827)
Testosterone, Bioavailable: 152.9 ng/dL (ref 110.0–575.0)

## 2017-05-30 LAB — FOLLICLE STIMULATING HORMONE: FSH: 5.9 m[IU]/mL (ref 1.6–8.0)

## 2017-05-30 LAB — TSH: TSH: 1.84 mIU/L (ref 0.40–4.50)

## 2017-05-30 LAB — T4, FREE: Free T4: 1.2 ng/dL (ref 0.8–1.8)

## 2017-05-30 LAB — LUTEINIZING HORMONE: LH: 3.7 m[IU]/mL (ref 1.5–9.3)

## 2017-06-04 ENCOUNTER — Ambulatory Visit (INDEPENDENT_AMBULATORY_CARE_PROVIDER_SITE_OTHER): Payer: BLUE CROSS/BLUE SHIELD | Admitting: "Endocrinology

## 2017-06-04 ENCOUNTER — Encounter (INDEPENDENT_AMBULATORY_CARE_PROVIDER_SITE_OTHER): Payer: Self-pay | Admitting: "Endocrinology

## 2017-06-04 VITALS — BP 120/84 | HR 88 | Wt 145.6 lb

## 2017-06-04 DIAGNOSIS — F329 Major depressive disorder, single episode, unspecified: Secondary | ICD-10-CM

## 2017-06-04 DIAGNOSIS — E1042 Type 1 diabetes mellitus with diabetic polyneuropathy: Secondary | ICD-10-CM

## 2017-06-04 DIAGNOSIS — I10 Essential (primary) hypertension: Secondary | ICD-10-CM | POA: Diagnosis not present

## 2017-06-04 DIAGNOSIS — E876 Hypokalemia: Secondary | ICD-10-CM | POA: Diagnosis not present

## 2017-06-04 DIAGNOSIS — E1065 Type 1 diabetes mellitus with hyperglycemia: Secondary | ICD-10-CM | POA: Diagnosis not present

## 2017-06-04 DIAGNOSIS — E063 Autoimmune thyroiditis: Secondary | ICD-10-CM | POA: Diagnosis not present

## 2017-06-04 DIAGNOSIS — E049 Nontoxic goiter, unspecified: Secondary | ICD-10-CM

## 2017-06-04 DIAGNOSIS — E1043 Type 1 diabetes mellitus with diabetic autonomic (poly)neuropathy: Secondary | ICD-10-CM

## 2017-06-04 DIAGNOSIS — IMO0001 Reserved for inherently not codable concepts without codable children: Secondary | ICD-10-CM

## 2017-06-04 DIAGNOSIS — E10649 Type 1 diabetes mellitus with hypoglycemia without coma: Secondary | ICD-10-CM | POA: Diagnosis not present

## 2017-06-04 DIAGNOSIS — F32A Depression, unspecified: Secondary | ICD-10-CM

## 2017-06-04 LAB — BASIC METABOLIC PANEL
BUN: 10 mg/dL (ref 7–25)
CALCIUM: 9.5 mg/dL (ref 8.6–10.3)
CHLORIDE: 99 mmol/L (ref 98–110)
CO2: 27 mmol/L (ref 20–31)
CREATININE: 0.84 mg/dL (ref 0.60–1.35)
Glucose, Bld: 167 mg/dL — ABNORMAL HIGH (ref 70–99)
Potassium: 4.2 mmol/L (ref 3.5–5.3)
Sodium: 137 mmol/L (ref 135–146)

## 2017-06-04 LAB — POCT GLUCOSE (DEVICE FOR HOME USE): POC GLUCOSE: 173 mg/dL — AB (ref 70–99)

## 2017-06-04 LAB — POCT GLYCOSYLATED HEMOGLOBIN (HGB A1C): HEMOGLOBIN A1C: 8.2

## 2017-06-04 NOTE — Progress Notes (Signed)
Subjective:  Patient Name: Scott Moran Date of Birth: May 20, 1991  MRN: [id number]  Scott Moran  presents to the office today for follow-up of his type 1 diabetes mellitus, goiter, hypoglycemia, depression, social anxiety disorder, avoidant personality disorder, hypothyroidism, thyroiditis, diabetic autonomic neuropathy, inappropriate sinus tachycardia, non-proliferative diabetic retinopathy, diabetic peripheral neuropathy, fatigue, hypogonadotropic hypogonadism, and medical non-compliance.  HISTORY OF PRESENT ILLNESS:   Scott Moran is a 26 y.o. Caucasian young man. Scott Moran was unaccompanied.   1. I first saw the patient in consultation on 03/27/05 in the [facility] in [place], [name]. He had been referred by his primary care provider, [name], in [place], [place], for evaluation and treatment of poorly controlled type 1 diabetes. The patient was 26 years old.  A. The patient had been diagnosed with type 1 diabetes in February of 1995 at age 13. He was on an insulin pump when I first met him. His hemoglobin A1c had increased from 7.1% in December of 2005 to 8.2% in April 2006. He was having frequent snacks that he was not taking boluses for. He was also frequently noncompliant with checking blood sugars and taking meal boluses. I adjusted his basal rates.  B. At his next Scott Moran visit on 06/19/05, he was still very noncompliant. His mental status was normal. He had a 25 gram goiter. His father was serving in [place] with the [employer]. There was a lot of stress at home. I again adjusted his basal rates.  C. At the end of July 2006 we closed our satellite office in [place]. The patient elected to be followed at our clinic here in [place]. I have followed him here ever since.  2. During the past twelve years, the patient has had several clinical issues.   A. T1DM: The patient's blood glucose control has usually been fairly good for a  teenager/young adult. His maximum hemoglobin A1c occurred on 04/16/06 when the hemoglobin A1c was 12.1%. Since then the hemoglobin A1c values have varied between 6.9% and 8.6%. He is still fairly noncompliant with checking blood sugars, bolusing, and changing his insulin pump sites on time, but he is more compliant and more careful than he used to be. He has never required hospital re-admission for DKA, severe hyperglycemia, or hypoglycemia.   B. Goiter, Hashimoto's thyroiditis, and hypothyroidism: In late 2008 and early 2009 the patient developed hypothyroidism secondary to Hashimoto's disease. I started him on Synthroid, 25 mcg per day. When he takes his medication, he is euthyroid.  C. Autonomic neuropathy and inappropriate sinus tachycardia: In late 2008 he developed autonomic neuropathy which manifested as inappropriate sinus tachycardia. These problems varied in parallel with his BGs and HbA1c values.  D. Diabetic peripheral neuropathy: The patient has had symptoms and signs of mild diabetic peripheral neuropathy at several times in the past. His peripheral neuropathic signs and symptoms similarly varied in parallel with his BGs and HbA1c values.   E. Non-proliferative diabetic retinopathy: This problem was identified during a diabetic eye exam in June 2010.  F. Hypogonadotropic hypogonadism: The patient has always been somewhat lackadaisical and relatively passive. On 06/29/10 his FSH was 4.9, LH was 2.4, testosterone was 399.66, and free testosterone was 79.8. The testosterone and free testosterone were relatively low for an 26 year old young man. At the same time the Scott Moran and LH while "normal", appeared to be actually inappropriately low for the level of testosterone he had. We discussed the options for treatment including exercise and medication, to include AndroGel. The  patient chose the exercise option. By 10/19/10 the [facility] was 6.8, the LH was 3.2, total testosterone was 615.88 and the free testosterone  was 131.6. In retrospect, when he was more depressed he may have had less gonadotropic function. We will continue to follow up on this issue over time.  G. Depression/Anxiety/Personality disorders:    1). The patient has been diagnosed with depression at different times in the past. For some time he took Remeron (mirtazapine), but discontinued that medication in early 2012. I had been trying to convince him for some time that he was depressed and that he should seek out help. He eventually did decide to seek psychiatric help.    2). On 06/08/13 Scott Moran was evaluated by [name], a noted psychiatrist in [place].  [name] and his staff performed neuropsychiatric testing. On the THURMAN SARVER had problems with non-verbal reasoning and emotional intelligence. 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 [name] prescribed escitalopram, 10 mg/day. Scott Moran was supposed to continue to see [name] in follow up. Unfortunately, Gracin did not follow up.    3). Scott Moran was then referred to a therapist, [name]. Scott Moran enjoys working with Dr. [name] and feels that he is making progress.    H. 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   3. The patient's last [facility] visit was on 04/03/17. At that visit we continued his insulin pump settings. He was supposed to call me in 4 weeks to review his BGs, but he did not. In the interim, he has been healthy.   A. In late August 2017 he transitioned to the Medtronic 670G pump and Guardian 3 sensor. Sometimes his sensors work and sometimes not. He got frustrated and refused to use the sensor. He did not call [employer] as I had asked him to do. He also did not follow up on the suggestions from me or from our diabetes educator, Scott Moran.   Scott Moran has  seen [name], his therapist, recently. Dr. Lorie Apley practice can't order anti-depressants for him. Scott Moran stopped taking on-line classes at [employer].  Scott Moran and Dr. [name] are concerned about what Scott Moran will do in the future and how to take the next step forward. Scott Moran attended the last visit.    C. His sleeps for 8-10 hours. He rarely takes naps anymore.    Juanda Bond takes his Synthroid dose of 25 mcg/day every night. He is not taking lisinopril and escitalopram regularly. .   E. He now has his learner's permit and is learning to drive. He does not feel ready yet to drive on his own. "I don't want to mess up." He is also severely afraid of other drivers that are terrible.   4. Pertinent Review of Systems:  Constitutional: Scott Moran feels "good". He feels that he is not as depressed. His allergies are not acting up much. He has not been getting out of the house very often. He still spends a very large amount of his waking hours on the Internet.  Eyes: Vision is good with his glasses. There are no significant eye complaints. Last eye exam was in November 2017. There were no signs of diabetic retinopathy. Mouth: No problems.  Neck: The patient has no complaints of anterior neck swelling, soreness, tenderness,  pressure, discomfort, or difficulty swallowing.  Heart: Heart rate increases  with exercise or other physical activity. The patient has no complaints of palpitations, irregular heat beats, chest pain, or chest pressure. Gastrointestinal: No postprandial bloating. Bowel movents seem normal. The patient has no other complaints of excessive hunger, acid reflux, upset stomach, stomach aches or pains, diarrhea, or constipation. Legs: Muscle mass and strength seem normal. There are no complaints of numbness, tingling, burning, or pain. No edema is noted. Feet: There are no obvious foot problems. There are no complaints of numbness, tingling, burning, or pain. No edema is noted. Hypoglycemia: He has had  a few low BGs recently. None were severe.   Emotional/psychological: The patient feels that he is "doing pretty good". He is still unwilling to take his lisinopril regularly, His is also not regularly taking the escitalopram that he asked me to prescribe for him. .  5. BG printout: He is changing his sites every 3-6 days. He is checking BGs 1-4 times per day, typically 2-3 times per day, but he sometimes goes for 24 hours between BG checks and still frequently fails to check BGs at bedtime. He boluses 3-6 times per day, but many of the boluses are only food boluses. Average BG is 222, compared with 244 at his last visit.. BG range is 55 to >400, compared with  52 to >400 at his last visit. The  longer he leaves sites in and the longer he goes between BG checks and insulin boluses, the more variable his BGs will be. He had three low  BGs in the past 4 weeks. The two BGs in the 50s occurred after he had not checked BGs for more than 24 hours, but had taken boluses without having checked BGs prior.  6. Sensor printout: None   PAST MEDICAL, FAMILY, AND SOCIAL HISTORY:  Past Medical History:  Diagnosis Date  . DM type 1 with diabetic peripheral neuropathy ([place])   . Goiter   . Hypertension   . Hypoglycemia associated with diabetes ([place])   . Hypogonadotropic hypogonadism ([place])   . Hypothyroidism, acquired, autoimmune   . Tachycardia   . Thyroiditis, autoimmune   . Type 1 diabetes mellitus not at goal [facility])   . Type 1 diabetes mellitus with diabetic autonomic neuropathy ([facility])     Family History  Problem Relation Age of Onset  . Diabetes Maternal Uncle   . Hypertension Maternal Grandmother   . Thyroid disease Neg Hx   . Obesity Neg Hx   . Kidney disease Neg Hx   . Cancer Neg Hx   . Anemia Neg Hx      Current Outpatient Prescriptions:  .  acetone, urine, test strip, Check ketones per protocol, Disp: 50 each, Rfl: 3 .  escitalopram (LEXAPRO) 10 MG tablet, Take one tablet daily., Disp: 30  tablet, Rfl: 2 .  glucose blood (BAYER CONTOUR NEXT TEST) test strip, Check sugar 10 x daily, Disp: 300 each, Rfl: 3 .  insulin aspart (NOVOLOG) 100 UNIT/ML injection, USE 300 UNITS IN PUMP EVERY 48-72 HOURS AND PER PROTOCOL FOR HYPERGLYCEMIA AND DKA OUTPATIENT TREATMENT., Disp: 120 mL, Rfl: 5 .  levothyroxine (SYNTHROID, LEVOTHROID) 25 MCG tablet, TAKE 1 TABLET BY MOUTH DAILY BEFORE BREAKFAST, Disp: 30 tablet, Rfl: 5 .  glucagon (GLUCAGON EMERGENCY) 1 MG injection, Inject 1 mg intramuscularly in thigh 1 time for severe hypoglycemia if unresponsive, unconscious, unable to swallow or has a seizure., Disp: 2 kit, Rfl: 3 .  ondansetron (ZOFRAN ODT) 4 MG disintegrating tablet, Take 1 tablet (4 mg total) by mouth every  8 (eight) hours as needed for nausea or vomiting. (Patient not taking: Reported on 10/30/2016), Disp: 20 tablet, Rfl: 0 .  promethazine (PHENERGAN) 25 MG tablet, Take 1 tablet (25 mg total) by mouth every 6 (six) hours as needed for nausea or vomiting. (Patient not taking: Reported on 10/30/2016), Disp: 20 tablet, Rfl: 0  Allergies as of 06/04/2017  . (No Known Allergies)    1. Work and Family: Patient stopped taking on-line courses at [employer] [facility] on 07/16/16.  2. Activities: Few physical activities. He is a history buff and frequently participates in online forum discussions about historical and political topics. 3. Smoking, alcohol, or drugs: None 4. Primary Care Provider: He will soon see Scott Moran's primary care provider who has agreed to prescribe escitalopram for Scott Moran.  5. Psychiatry: He no longer has a psychiatrist.   REVIEW OF SYSTEMS: There are no other significant problems involving Nitin's other body systems.   Objective:  Vital Signs:  BP 120/84   Pulse 88   Wt 145 lb 9.6 oz (66 kg)   BMI 22.85 kg/m  HR 1`   Ht Readings from Last 3 Encounters:  01/31/17 5' 6.93" (1.7 m)  09/15/15 '5\' 9"'$  (1.753 m)  08/22/13 '5\' 7"'$  (1.702 m)   Wt Readings from Last 3 Encounters:  06/04/17  145 lb 9.6 oz (66 kg)  04/03/17 144 lb (65.3 kg)  02/12/17 144 lb (65.3 kg)   PHYSICAL EXAM: Constitutional: Scott Moran was alert and cognitively normal today. His affect was fairly normal today. His insight is fair. His ability to control his own thoughts and actions is better today. His weight has increased 1.5 pounds since last visit. He is fairly chatty. His flow of thoughts and speech fluency are normal. His comments are rational and lucid. Although he is very intelligent, he still can't make decisions and move forward when he is depressed. He has been inconsistent in his DM care again this past month. He still wants to spend most of the visit talking about politics and history. I had to re-direct him multiple times.  Face: The face appears normal.  Eyes: There is no obvious arcus or proptosis. Moisture appears normal. Mouth: The oropharynx and tongue appear normal. Oral moisture is normal. Neck: The neck looks normal. No carotid bruits are noted. The thyroid gland is again mildly enlarged, but  smaller at 20-21 grams in size today. Both lobes are symmetrically enlarged. The consistency of the thyroid gland is normal today.The thyroid gland is not tender to palpation.  Lungs: The lungs are clear to auscultation. Air movement is good. Heart: Heart rate remains tachycardic. Heart rhythm is regular. Heart sounds S1 and S2 are normal. I did not appreciate any pathologic cardiac murmurs. Abdomen: The abdomen is normal in size, but larger. Bowel sounds are normal. There is no obvious hepatomegaly, splenomegaly, or other mass effect.  Arms: Muscle size and bulk are normal for age. Hands: There is no obvious tremor. Phalangeal and metacarpophalangeal joints are normal. Palmar muscles are normal. Palmar skin is normal. Palmar moisture is also normal. Legs: Muscles appear normal for age. No edema is present. Feet: Feet are normally formed. Dorsalis pedal pulses are 1+ bilaterally.   Neurologic: Strength is  normal for age in both the upper and lower extremities. Muscle tone is normal. Sensation to touch is normal in both legs, but slightly decreased to touch in the heels.     LAB DATA:   Results for orders placed or performed in visit on 06/04/17 (from the  past 504 hour(s))  POCT Glucose (Device for Home Use)   Collection Time: 06/04/17  1:25 PM  Result Value Ref Range   Glucose Fasting, POC  70 - 99 mg/dL   POC Glucose 342 (A) 70 - 99 mg/dl  POCT HgB I9G   Collection Time: 06/04/17  1:40 PM  Result Value Ref Range   Hemoglobin A1C 8.2   Results for orders placed or performed in visit on 04/03/17 (from the past 504 hour(s))  Comprehensive metabolic panel   Collection Time: 05/29/17  1:46 PM  Result Value Ref Range   Sodium 136 135 - 146 mmol/L   Potassium 5.8 (H) 3.5 - 5.3 mmol/L   Chloride 100 98 - 110 mmol/L   CO2 25 20 - 31 mmol/L   Glucose, Bld 310 (H) 70 - 99 mg/dL   BUN 7 7 - 25 mg/dL   Creat 5.91 6.59 - 4.57 mg/dL   Total Bilirubin 0.8 0.2 - 1.2 mg/dL   Alkaline Phosphatase 69 40 - 115 U/L   AST 24 10 - 40 U/L   ALT 26 9 - 46 U/L   Total Protein 7.4 6.1 - 8.1 g/dL   Albumin 4.4 3.6 - 5.1 g/dL   Calcium 9.3 8.6 - 78.3 mg/dL  Lipid panel   Collection Time: 05/29/17  1:46 PM  Result Value Ref Range   Cholesterol 187 <200 mg/dL   Triglycerides 62 <145 mg/dL   HDL 59 >30 mg/dL   Total CHOL/HDL Ratio 3.2 <5.0 Ratio   VLDL 12 <30 mg/dL   LDL Cholesterol 623 (H) <100 mg/dL  Microalbumin / creatinine urine ratio   Collection Time: 05/29/17  1:46 PM  Result Value Ref Range   Creatinine, Urine 164 20 - 370 mg/dL   Microalb, Ur 0.7 Not estab mg/dL   Microalb Creat Ratio 4 <30 mcg/mg creat  T3, free   Collection Time: 05/29/17  1:46 PM  Result Value Ref Range   T3, Free 3.4 2.3 - 4.2 pg/mL  T4, free   Collection Time: 05/29/17  1:46 PM  Result Value Ref Range   Free T4 1.2 0.8 - 1.8 ng/dL  TSH   Collection Time: 05/29/17  1:46 PM  Result Value Ref Range   TSH 1.84  0.40 - 4.50 mIU/L  Luteinizing hormone   Collection Time: 05/29/17  1:46 PM  Result Value Ref Range   LH 3.7 1.5 - 9.3 mIU/mL  Follicle stimulating hormone   Collection Time: 05/29/17  1:46 PM  Result Value Ref Range   FSH 5.9 1.6 - 8.0 mIU/mL  Testosterone Total,Free,Bio, Males   Collection Time: 05/29/17  1:46 PM  Result Value Ref Range   Testosterone 634 250 - 827 ng/dL   Albumin 4.4 3.6 - 5.1 g/dL   Sex Hormone Binding 40 10 - 50 nmol/L   Testosterone, Free 76.0 46.0 - 224.0 pg/mL   Testosterone, Bioavailable 152.9 110.0 - 575.0 ng/dL  Estradiol   Collection Time: 05/29/17  1:46 PM  Result Value Ref Range   Estradiol 31 <=39 pg/mL   Labs 06/04/17: CBG 173  Labs 05/29/17: HbA1c 8.2%; TSH 1.84, free T4 1.2, free T3 3.4, CMP normal except for glucose 310 and potassium 5.8; LH 3.7, FSH 5.9, testosterone 634, free testosterone 76 (ref 46-224), estradiol 31; microalbumin/creatinine ratio 4; cholesterol 187, triglycerides 63, HDL 59, LDL 116  Labs 04/03/17: CBG 182  Labs 01/31/17: HbA1c 7.6%, CBG 341  Labs 10/30/16: HbA1c 7.5%  Labs 07/25/16: HbA1c 7.8%  Labs 04/25/16:  HbA1c 7.7%  Labs 04/09/16: LH 3.4, FSH 6.0, testosterone 504, free testosterone 85.5 (ref 47-244); cholesterol 176, triglycerides 92, HDL 80, LDL 88; CMP norma except for glucose 224; TSH 1.75, free T4 1.2, free T3 3.2; microalbumin/creatinine ratio 4  Labs 01/24/16: HbA1c 8.6%  Labs 10/20/15: HbA1c 8.1%  Labs 09/15/15: BMP normal except for glucose of 143 and calcium 8.3.  Labs 07/20/15: HbA1c 8.2%  Labs 03/25/15: HbA1c 7.8%  Labs 12/02/14: HbA1c 8.5%;  LH 4.0, FSH 8.2, testosterone 621.97; CMP normal; TSH 2.01, free T4 1.21, free T3 3.6; cholesterol 155, triglycerides 46, HDL  55, LDL 91; microalbumin/creatinine ratio 6.6  Labs 03/30/14: TSH 1.935, free T4 1.12, free T3 3.2; CMP: normal except for glucose 203; testosterone 624, FSH 6.3, LH 2.4; urine microalbumin/creatinine ratio 6.4; cholesterol 163,  triglycerides 55, HDL 64, LDL 80  Labs 08/23/13: TSH 7.116; CMP normal except for a glucose of 145; CBC: WBC 5.4, Hgb 12.7, Hct 34.7%   Assessment and Plan:   ASSESSMENT:  1-2. Type 1 diabetes mellitus/hypoglycemia:   A. Quinn has continued to be inconsistent about checking BGs and giving boluses. He has not used his sensor in the past 3 months.  B. His BGs have generally been higher this past month.  C. He had three hypoglycemic BGs, two of which occurred after long periods without checking BGs. He remain inconsistent about checking his  BGs at bedtime.  3. Autonomic neuropathy and inappropriate sinus tachycardia: These problems are about the same. Fortunately, these problems will reverse with better BG control.   4. Peripheral neuropathy: This problem is still present.    5. Hypothyroid: He was euthyroid in December 2015, in May 2017, and again in June 2018 on his current dose of Synthroid. 6. Hypogonadotropic hypogonadism: His testosterone level in May 2017 was acceptable, but was lower than in December 2015. His testosterone level in June 2018 was good, but his free testosterone is relatively low for his age.  Depression still seems to be a major factor in his testosterone levels.  7. Hypertension: His SBPs is good, but his DBP is too high. He needs to take his lisinopril and exercise regularly.  8. Goiter/Hashimoto's disease: His goiter is again mildly enlarged, but slightly smaller today. The pattern of waxing and waning of thyroid gland size is c/w evolving thyroiditis. Thyroiditis is clinically quiescent 9. Depression: This problem is better today. He does not exhibit any suicidal thoughts or emotions. He needs to continue to see Dr. Loreta Ave and to re-establish care with a psychiatrist. He also needs to take his escitalopram. Since he does not have a psychiatrist or a PCP to prescribe escitalopram, I had agreed to prescribe it for 30 days with 2 refills. That would have allowed him to find a  psychiatrist or PCP. He says that he will have new PCP soon. He is not interested in finding another psychiatrist.  10. Noncompliance: He was doing better immediately after starting the new sensor-pump combination, but has not done as well in the past month. I will arrange for his to see Scott Short, NP for some assistance with his sensors. I still continue to hope that as Azreal ages and matures, he will do a better job of taking care of his T1DM and his life. Perhaps the escitalopram will help, if he takes it. .  11. Weight loss, unintentional: This problem has resolved.      12. Hypokalemia: He need to have his BMP checked now.   PLAN:  1.  Diagnostic: CBG today. Annual surveillance labs soon. Call in 4 weeks to discuss BGs. BMP today.  2. Therapeutic: Continue Synthroid. Resume lisinopril and escitalopram. Give both correction boluses and food boluses. See Scott Moran about using his sensor. Continue his current basal rates:  MN: 0.800 5 AM: 0.800 8 AM: 0.650 Noon: 0.900 6 PM: 1.600 9 PM: 1.300 3. Patient education: Marcia is a very intelligent young man who knows intellectually what he should do and how to do it. Unfortunately, he lacks the commitment to taking positive actions about his healthcare and about his life in general. He is mired in his own depression and personality disorders. 4. Follow-up: 2 months with me and within a few weeks with Scott Moran.   Level of Service: This visit lasted in excess of 55 minutes. More than 50% of the visit was devoted to counseling.  [name] Sers, MD, CDE Adult and Pediatric Endocrinology 06/04/2017 1:43 PM

## 2017-06-04 NOTE — Patient Instructions (Signed)
Follow up visit with me in two months. Please set up an appointment with Mr. Scott Moran on a next available follow up basis to help Scott Moran with his sensor problems.

## 2017-06-06 ENCOUNTER — Ambulatory Visit (INDEPENDENT_AMBULATORY_CARE_PROVIDER_SITE_OTHER): Payer: BLUE CROSS/BLUE SHIELD | Admitting: Family

## 2017-06-06 ENCOUNTER — Encounter (INDEPENDENT_AMBULATORY_CARE_PROVIDER_SITE_OTHER): Payer: Self-pay | Admitting: Family

## 2017-06-06 VITALS — BP 100/60 | HR 100 | Wt 147.0 lb

## 2017-06-06 DIAGNOSIS — E1043 Type 1 diabetes mellitus with diabetic autonomic (poly)neuropathy: Secondary | ICD-10-CM | POA: Diagnosis not present

## 2017-06-06 DIAGNOSIS — Z9641 Presence of insulin pump (external) (internal): Secondary | ICD-10-CM | POA: Diagnosis not present

## 2017-06-10 ENCOUNTER — Encounter (INDEPENDENT_AMBULATORY_CARE_PROVIDER_SITE_OTHER): Payer: Self-pay | Admitting: Family

## 2017-06-10 NOTE — Patient Instructions (Signed)
Remember to use two piece of tape to secure sensor properly  Always use skin tac  Put with thumb down  Be patient!  If transmitter does not work, please Actorcall [employer]tronic for replacement.   Follow up with Dr. Fransico MichaelBrennan.

## 2017-06-10 NOTE — Progress Notes (Signed)
Subjective:  Patient Name: Scott Moran Date of Birth: May 20, 1991  MRN: [id number]  Scott Moran  presents to the office today for follow-up of his type 1 diabetes mellitus, goiter, hypoglycemia, depression, social anxiety disorder, avoidant personality disorder, hypothyroidism, thyroiditis, diabetic autonomic neuropathy, inappropriate sinus tachycardia, non-proliferative diabetic retinopathy, diabetic peripheral neuropathy, fatigue, hypogonadotropic hypogonadism, and medical non-compliance.  HISTORY OF PRESENT ILLNESS:   Scott Moran is a 26 y.o. Caucasian young man. Scott Moran was unaccompanied.   1. I first saw the patient in consultation on 03/27/05 in the [facility] in [place], [name]. He had been referred by his primary care provider, [name], in [place], [place], for evaluation and treatment of poorly controlled type 1 diabetes. The patient was 26 years old.  A. The patient had been diagnosed with type 1 diabetes in February of 1995 at age 13. He was on an insulin pump when I first met him. His hemoglobin A1c had increased from 7.1% in December of 2005 to 8.2% in April 2006. He was having frequent snacks that he was not taking boluses for. He was also frequently noncompliant with checking blood sugars and taking meal boluses. I adjusted his basal rates.  B. At his next Scott Moran visit on 06/19/05, he was still very noncompliant. His mental status was normal. He had a 25 gram goiter. His father was serving in [place] with the [employer]. There was a lot of stress at home. I again adjusted his basal rates.  C. At the end of July 2006 we closed our satellite office in [place]. The patient elected to be followed at our clinic here in [place]. I have followed him here ever since.  2. During the past twelve years, the patient has had several clinical issues.   A. T1DM: The patient's blood glucose control has usually been fairly good for a  teenager/young adult. His maximum hemoglobin A1c occurred on 04/16/06 when the hemoglobin A1c was 12.1%. Since then the hemoglobin A1c values have varied between 6.9% and 8.6%. He is still fairly noncompliant with checking blood sugars, bolusing, and changing his insulin pump sites on time, but he is more compliant and more careful than he used to be. He has never required hospital re-admission for DKA, severe hyperglycemia, or hypoglycemia.   B. Goiter, Hashimoto's thyroiditis, and hypothyroidism: In late 2008 and early 2009 the patient developed hypothyroidism secondary to Hashimoto's disease. I started him on Synthroid, 25 mcg per day. When he takes his medication, he is euthyroid.  C. Autonomic neuropathy and inappropriate sinus tachycardia: In late 2008 he developed autonomic neuropathy which manifested as inappropriate sinus tachycardia. These problems varied in parallel with his BGs and HbA1c values.  D. Diabetic peripheral neuropathy: The patient has had symptoms and signs of mild diabetic peripheral neuropathy at several times in the past. His peripheral neuropathic signs and symptoms similarly varied in parallel with his BGs and HbA1c values.   E. Non-proliferative diabetic retinopathy: This problem was identified during a diabetic Scott exam in June 2010.  F. Hypogonadotropic hypogonadism: The patient has always been somewhat lackadaisical and relatively passive. On 06/29/10 his FSH was 4.9, LH was 2.4, testosterone was 399.66, and free testosterone was 79.8. The testosterone and free testosterone were relatively low for an 26 year old young man. At the same time the Scott Moran and LH while "normal", appeared to be actually inappropriately low for the level of testosterone he had. We discussed the options for treatment including exercise and medication, to include AndroGel. The  patient chose the exercise option. By 10/19/10 the Scott Moran was 6.8, the LH was 3.2, total testosterone was 615.88 and the free testosterone  was 131.6. In retrospect, when he was more depressed he may have had less gonadotropic function. We will continue to follow up on this issue over time.  G. Depression/Anxiety/Personality disorders:    1). The patient has been diagnosed with depression at different times in the past. For some time he took Remeron (mirtazapine), but discontinued that medication in early 2012. I had been trying to convince him for some time that he was depressed and that he should seek out help. He eventually did decide to seek psychiatric help.    2). On 06/08/13 Scott Moran was evaluated by Dr. Carlena Moran, a noted psychiatrist in [place].  Dr. [name] and his staff performed neuropsychiatric testing. On the Scott Moran had problems with non-verbal reasoning and emotional intelligence. Dr. 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 Dr. [name] prescribed escitalopram, 10 mg/day. Scott Moran was supposed to continue to see Dr. [name] in follow up. Unfortunately, [place] did not follow up.    3). Scott Moran was then referred to a therapist, [name]. Scott Moran enjoys working with Dr. [name] and feels that he is making progress.    H. 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   3. The patient's last [facility] visit was on 07/18. He was asked by Dr. Tobe Sos at this time to come and see me for further training on his 670g along with his sensor.   Scott Moran reports that he has tried using the sensor a few times. Some of the times the sensor works great and will give him correct blood sugars. But then he experience two sensors that failed after 3-4 days of wear or would not hold connection to his pump. He decided at that point he would no longer wear the sensor. He has never called [employer] to discuss possibility of the CGM transmitter being bad.    Scott Moran demonstrated how he applies the sensor when he puts it on. His mother usually helps him because he puts it on his arm. Scott Moran does not like the tape so he only uses one small piece and does not secure the sensor to him securely. He also becomes very frustrated if the sensor does not connect right away. He agreed to allow me to help him place the sensor today.    4. Pertinent Review of Systems:  Constitutional: Scott Moran feels "good".  Eyes: Vision is good with his glasses. There are no significant Scott complaints. Last Scott exam was in November 2017. There were no signs of diabetic retinopathy. Mouth: No problems.  Neck: The patient has no complaints of anterior neck swelling, soreness, tenderness,  pressure, discomfort, or difficulty swallowing.  Heart: Heart rate increases with exercise or other physical activity. The patient has no complaints of palpitations, irregular heat beats, chest pain, or chest pressure. Gastrointestinal: No postprandial bloating. Bowel movents seem normal. The patient has no other complaints of excessive hunger, acid reflux, upset stomach, stomach aches or pains, diarrhea, or constipation. Legs: Muscle mass and strength seem normal. There are no complaints of numbness, tingling, burning, or pain. No edema is noted.   .  5. BG printout: Not reviewed today.   6. Sensor printout: None   PAST MEDICAL, FAMILY, AND SOCIAL HISTORY:  Past Medical  History:  Diagnosis Date  . DM type 1 with diabetic peripheral neuropathy ([place])   . Goiter   . Hypertension   . Hypoglycemia associated with diabetes ([place])   . Hypogonadotropic hypogonadism ([place])   . Hypothyroidism, acquired, autoimmune   . Tachycardia   . Thyroiditis, autoimmune   . Type 1 diabetes mellitus not at goal [facility])   . Type 1 diabetes mellitus with diabetic autonomic neuropathy ([facility])     Family History  Problem Relation Age of Onset  . Diabetes Maternal Uncle   . Hypertension Maternal Grandmother    . Thyroid disease Neg Hx   . Obesity Neg Hx   . Kidney disease Neg Hx   . Cancer Neg Hx   . Anemia Neg Hx      Current Outpatient Prescriptions:  .  acetone, urine, test strip, Check ketones per protocol, Disp: 50 each, Rfl: 3 .  escitalopram (LEXAPRO) 10 MG tablet, Take one tablet daily., Disp: 30 tablet, Rfl: 2 .  glucagon (GLUCAGON EMERGENCY) 1 MG injection, Inject 1 mg intramuscularly in thigh 1 time for severe hypoglycemia if unresponsive, unconscious, unable to swallow or has a seizure., Disp: 2 kit, Rfl: 3 .  glucose blood (BAYER CONTOUR NEXT TEST) test strip, Check sugar 10 x daily, Disp: 300 each, Rfl: 3 .  insulin aspart (NOVOLOG) 100 UNIT/ML injection, USE 300 UNITS IN PUMP EVERY 48-72 HOURS AND PER PROTOCOL FOR HYPERGLYCEMIA AND DKA OUTPATIENT TREATMENT., Disp: 120 mL, Rfl: 5 .  levothyroxine (SYNTHROID, LEVOTHROID) 25 MCG tablet, TAKE 1 TABLET BY MOUTH DAILY BEFORE BREAKFAST, Disp: 30 tablet, Rfl: 5 .  ondansetron (ZOFRAN ODT) 4 MG disintegrating tablet, Take 1 tablet (4 mg total) by mouth every 8 (eight) hours as needed for nausea or vomiting. (Patient not taking: Reported on 10/30/2016), Disp: 20 tablet, Rfl: 0 .  promethazine (PHENERGAN) 25 MG tablet, Take 1 tablet (25 mg total) by mouth every 6 (six) hours as needed for nausea or vomiting. (Patient not taking: Reported on 10/30/2016), Disp: 20 tablet, Rfl: 0  Allergies as of 06/06/2017  . (No Known Allergies)    1. Work and Family: Patient stopped taking on-line courses at [employer] on 07/16/16.  2. Activities: Few physical activities. He is a history buff and frequently participates in online forum discussions about historical and political topics. 3. Smoking, alcohol, or drugs: None 4. Primary Care Provider: He will soon see mom's primary care provider who has agreed to prescribe escitalopram for Scott Moran.  5. Psychiatry: He no longer has a psychiatrist.   REVIEW OF SYSTEMS: There are no other significant problems involving  Scott Moran's other body systems.   Objective:  Vital Signs:  BP 100/60   Pulse 100   Wt 147 lb (66.7 kg)   BMI 23.07 kg/m  HR 1`   Ht Readings from Last 3 Encounters:  01/31/17 5' 6.93" (1.7 m)  09/15/15 _0  (1.753 m)  08/22/13 _1  (1.702 m)   Wt Readings from Last 3 Encounters:  06/06/17 147 lb (66.7 kg)  06/04/17 145 lb 9.6 oz (66 kg)  04/03/17 144 lb (65.3 kg)   PHYSICAL EXAM: Constitutional: Scott Moran was alert and cognitively normal today. He becomes frustrated quickly but is able to be redirected. He is friendly and talkative.  Face: The face appears normal.  Eyes: There is no obvious arcus or proptosis. Moisture appears normal. Mouth: The oropharynx and tongue appear normal. Oral moisture is normal. Neck: The neck looks normal. No carotid bruits are noted. The thyroid  gland is again mildly enlarged, but  smaller at 20-21 grams in size today. Both lobes are symmetrically enlarged. The consistency of the thyroid gland is normal today.The thyroid gland is not tender to palpation.  Lungs: The lungs are clear to auscultation. Air movement is good. Heart: Heart rate remains tachycardic. Heart rhythm is regular. Heart sounds S1 and S2 are normal. I did not appreciate any pathologic cardiac murmurs. Abdomen: The abdomen is normal in size, but larger. Bowel sounds are normal. There is no obvious hepatomegaly, splenomegaly, or other mass effect.  Neurologic: Strength is normal for age in both the upper and lower extremities. Muscle tone is normal. Sensation to touch is normal in both legs, but slightly decreased to touch in the heels.     LAB DATA:   Results for orders placed or performed in visit on 06/04/17 (from the past 504 hour(s))  POCT Glucose (Device for Home Use)   Collection Time: 06/04/17  1:25 PM  Result Value Ref Range   Glucose Fasting, POC  70 - 99 mg/dL   POC Glucose 173 (A) 70 - 99 mg/dl  POCT HgB A1C   Collection Time: 06/04/17  1:40 PM  Result Value Ref Range    Hemoglobin A1C 8.2   Basic metabolic panel   Collection Time: 06/04/17  2:36 PM  Result Value Ref Range   Sodium 137 135 - 146 mmol/L   Potassium 4.2 3.5 - 5.3 mmol/L   Chloride 99 98 - 110 mmol/L   CO2 27 20 - 31 mmol/L   Glucose, Bld 167 (H) 70 - 99 mg/dL   BUN 10 7 - 25 mg/dL   Creat 0.84 0.60 - 1.35 mg/dL   Calcium 9.5 8.6 - 10.3 mg/dL  Results for orders placed or performed in visit on 04/03/17 (from the past 504 hour(s))  Comprehensive metabolic panel   Collection Time: 05/29/17  1:46 PM  Result Value Ref Range   Sodium 136 135 - 146 mmol/L   Potassium 5.8 (H) 3.5 - 5.3 mmol/L   Chloride 100 98 - 110 mmol/L   CO2 25 20 - 31 mmol/L   Glucose, Bld 310 (H) 70 - 99 mg/dL   BUN 7 7 - 25 mg/dL   Creat 0.87 0.60 - 1.35 mg/dL   Total Bilirubin 0.8 0.2 - 1.2 mg/dL   Alkaline Phosphatase 69 40 - 115 U/L   AST 24 10 - 40 U/L   ALT 26 9 - 46 U/L   Total Protein 7.4 6.1 - 8.1 g/dL   Albumin 4.4 3.6 - 5.1 g/dL   Calcium 9.3 8.6 - 10.3 mg/dL  Lipid panel   Collection Time: 05/29/17  1:46 PM  Result Value Ref Range   Cholesterol 187 <200 mg/dL   Triglycerides 62 <150 mg/dL   HDL 59 >40 mg/dL   Total CHOL/HDL Ratio 3.2 <5.0 Ratio   VLDL 12 <30 mg/dL   LDL Cholesterol 116 (H) <100 mg/dL  Microalbumin / creatinine urine ratio   Collection Time: 05/29/17  1:46 PM  Result Value Ref Range   Creatinine, Urine 164 20 - 370 mg/dL   Microalb, Ur 0.7 Not estab mg/dL   Microalb Creat Ratio 4 <30 mcg/mg creat  T3, free   Collection Time: 05/29/17  1:46 PM  Result Value Ref Range   T3, Free 3.4 2.3 - 4.2 pg/mL  T4, free   Collection Time: 05/29/17  1:46 PM  Result Value Ref Range   Free T4 1.2 0.8 - 1.8 ng/dL  TSH   Collection Time: 05/29/17  1:46 PM  Result Value Ref Range   TSH 1.84 0.40 - 4.50 mIU/L  Luteinizing hormone   Collection Time: 05/29/17  1:46 PM  Result Value Ref Range   LH 3.7 1.5 - 9.3 mIU/mL  Follicle stimulating hormone   Collection Time: 05/29/17  1:46 PM   Result Value Ref Range   FSH 5.9 1.6 - 8.0 mIU/mL  Testosterone Total,Free,Bio, Males   Collection Time: 05/29/17  1:46 PM  Result Value Ref Range   Testosterone 634 250 - 827 ng/dL   Albumin 4.4 3.6 - 5.1 g/dL   Sex Hormone Binding 40 10 - 50 nmol/L   Testosterone, Free 76.0 46.0 - 224.0 pg/mL   Testosterone, Bioavailable 152.9 110.0 - 575.0 ng/dL  Estradiol   Collection Time: 05/29/17  1:46 PM  Result Value Ref Range   Estradiol 31 <=39 pg/mL   Labs 06/04/17: CBG 173  Labs 05/29/17: HbA1c 8.2%; TSH 1.84, free T4 1.2, free T3 3.4, CMP normal except for glucose 310 and potassium 5.8; LH 3.7, FSH 5.9, testosterone 634, free testosterone 76 (ref 46-224), estradiol 31; microalbumin/creatinine ratio 4; cholesterol 187, triglycerides 63, HDL 59, LDL 116  Labs 04/03/17: CBG 182  Labs 01/31/17: HbA1c 7.6%, CBG 341  Labs 10/30/16: HbA1c 7.5%  Labs 07/25/16: HbA1c 7.8%  Labs 04/25/16: HbA1c 7.7%  Labs 04/09/16: LH 3.4, FSH 6.0, testosterone 504, free testosterone 85.5 (ref 47-244); cholesterol 176, triglycerides 92, HDL 80, LDL 88; CMP norma except for glucose 224; TSH 1.75, free T4 1.2, free T3 3.2; microalbumin/creatinine ratio 4  Labs 01/24/16: HbA1c 8.6%  Labs 10/20/15: HbA1c 8.1%  Labs 09/15/15: BMP normal except for glucose of 143 and calcium 8.3.  Labs 07/20/15: HbA1c 8.2%  Labs 03/25/15: HbA1c 7.8%  Labs 12/02/14: HbA1c 8.5%;  LH 4.0, FSH 8.2, testosterone 621.97; CMP normal; TSH 2.01, free T4 1.21, free T3 3.6; cholesterol 155, triglycerides 46, HDL  55, LDL 91; microalbumin/creatinine ratio 6.6  Labs 03/30/14: TSH 1.935, free T4 1.12, free T3 3.2; CMP: normal except for glucose 203; testosterone 624, FSH 6.3, LH 2.4; urine microalbumin/creatinine ratio 6.4; cholesterol 163, triglycerides 55, HDL 64, LDL 80  Labs 08/23/13: TSH 1.139; CMP normal except for a glucose of 145; CBC: WBC 5.4, Hgb 12.7, Hct 34.7%   Assessment and Plan:   ASSESSMENT:  1-2. Type 1 diabetes mellitus  with insulin pump/hypoglycemia:   - Today Ifeoluwa, his mother and I spent an extensive amount of time working on his [employer] 670g insulin pump and the sensor application.   - Sensor was placed to Konstantinos's left upper arm. Two piece of tape were applied to secure it properly to him   - Transmitter connected to pump quickly and was working prior to [employer] leaving.   - Reviewed when to calibrate sensor and how Auto mode can best help Lejend with his diabetes.  PLAN:  1. Diagnostic: No labs today. Started CGM sensor.  2. Therapeutic: Guardian sensor placed and working.  3. Patient education:  Discussed and reviewed placement of Guardian sensor in detail with Scott Moran. Instructed him that if the transmitter fails to work, please call [employer] for replacement. Answered all questions.    Level of Service: This visit lasted in excess of 25 minutes. More than 50% of the visit was devoted to counseling.  Hermenia Bers, FNP-C

## 2017-06-12 ENCOUNTER — Encounter (INDEPENDENT_AMBULATORY_CARE_PROVIDER_SITE_OTHER): Payer: Self-pay

## 2017-06-12 ENCOUNTER — Other Ambulatory Visit (INDEPENDENT_AMBULATORY_CARE_PROVIDER_SITE_OTHER): Payer: Self-pay | Admitting: "Endocrinology

## 2017-06-12 DIAGNOSIS — E1065 Type 1 diabetes mellitus with hyperglycemia: Principal | ICD-10-CM

## 2017-06-12 DIAGNOSIS — IMO0001 Reserved for inherently not codable concepts without codable children: Secondary | ICD-10-CM

## 2017-06-14 ENCOUNTER — Other Ambulatory Visit (INDEPENDENT_AMBULATORY_CARE_PROVIDER_SITE_OTHER): Payer: Self-pay | Admitting: *Deleted

## 2017-06-14 DIAGNOSIS — IMO0001 Reserved for inherently not codable concepts without codable children: Secondary | ICD-10-CM

## 2017-06-14 DIAGNOSIS — E1065 Type 1 diabetes mellitus with hyperglycemia: Principal | ICD-10-CM

## 2017-06-14 MED ORDER — INSULIN LISPRO 100 UNIT/ML ~~LOC~~ SOLN: SUBCUTANEOUS | 5 refills | Status: DC

## 2017-06-14 NOTE — Telephone Encounter (Signed)
Spoke to father, advised insurance wants Humalog, new script sent to pharmacy'

## 2017-06-17 ENCOUNTER — Telehealth (INDEPENDENT_AMBULATORY_CARE_PROVIDER_SITE_OTHER): Payer: Self-pay | Admitting: *Deleted

## 2017-06-17 ENCOUNTER — Other Ambulatory Visit (INDEPENDENT_AMBULATORY_CARE_PROVIDER_SITE_OTHER): Payer: Self-pay | Admitting: *Deleted

## 2017-06-17 DIAGNOSIS — E1065 Type 1 diabetes mellitus with hyperglycemia: Principal | ICD-10-CM

## 2017-06-17 DIAGNOSIS — IMO0001 Reserved for inherently not codable concepts without codable children: Secondary | ICD-10-CM

## 2017-06-17 MED ORDER — INSULIN ASPART 100 UNIT/ML ~~LOC~~ SOLN: SUBCUTANEOUS | 5 refills | Status: DC

## 2017-06-17 NOTE — Telephone Encounter (Signed)
Spoke to parents, advised that their insurance is denying both humalog and novolog. I asked them to find out which insulin they wanted and let me know so I can send in a script.

## 2017-06-20 DIAGNOSIS — F4320 Adjustment disorder, unspecified: Secondary | ICD-10-CM | POA: Diagnosis not present

## 2017-07-08 ENCOUNTER — Other Ambulatory Visit (INDEPENDENT_AMBULATORY_CARE_PROVIDER_SITE_OTHER): Payer: Self-pay | Admitting: *Deleted

## 2017-07-11 DIAGNOSIS — F4320 Adjustment disorder, unspecified: Secondary | ICD-10-CM | POA: Diagnosis not present

## 2017-07-15 ENCOUNTER — Other Ambulatory Visit (INDEPENDENT_AMBULATORY_CARE_PROVIDER_SITE_OTHER): Payer: Self-pay | Admitting: "Endocrinology

## 2017-07-15 DIAGNOSIS — F3289 Other specified depressive episodes: Secondary | ICD-10-CM

## 2017-08-13 ENCOUNTER — Ambulatory Visit (INDEPENDENT_AMBULATORY_CARE_PROVIDER_SITE_OTHER): Payer: BLUE CROSS/BLUE SHIELD | Admitting: "Endocrinology

## 2017-08-13 ENCOUNTER — Encounter (INDEPENDENT_AMBULATORY_CARE_PROVIDER_SITE_OTHER): Payer: Self-pay | Admitting: "Endocrinology

## 2017-08-13 VITALS — BP 122/76 | HR 90 | Wt 146.0 lb

## 2017-08-13 DIAGNOSIS — E049 Nontoxic goiter, unspecified: Secondary | ICD-10-CM

## 2017-08-13 DIAGNOSIS — E10649 Type 1 diabetes mellitus with hypoglycemia without coma: Secondary | ICD-10-CM

## 2017-08-13 DIAGNOSIS — F3341 Major depressive disorder, recurrent, in partial remission: Secondary | ICD-10-CM

## 2017-08-13 DIAGNOSIS — IMO0001 Reserved for inherently not codable concepts without codable children: Secondary | ICD-10-CM

## 2017-08-13 DIAGNOSIS — E1043 Type 1 diabetes mellitus with diabetic autonomic (poly)neuropathy: Secondary | ICD-10-CM

## 2017-08-13 DIAGNOSIS — Z91199 Patient's noncompliance with other medical treatment and regimen due to unspecified reason: Secondary | ICD-10-CM

## 2017-08-13 DIAGNOSIS — E1065 Type 1 diabetes mellitus with hyperglycemia: Secondary | ICD-10-CM | POA: Diagnosis not present

## 2017-08-13 DIAGNOSIS — E1042 Type 1 diabetes mellitus with diabetic polyneuropathy: Secondary | ICD-10-CM | POA: Diagnosis not present

## 2017-08-13 DIAGNOSIS — I10 Essential (primary) hypertension: Secondary | ICD-10-CM

## 2017-08-13 DIAGNOSIS — Z9119 Patient's noncompliance with other medical treatment and regimen: Secondary | ICD-10-CM | POA: Diagnosis not present

## 2017-08-13 DIAGNOSIS — R634 Abnormal weight loss: Secondary | ICD-10-CM

## 2017-08-13 LAB — POCT GLUCOSE (DEVICE FOR HOME USE): POC GLUCOSE: 354 mg/dL — AB (ref 70–99)

## 2017-08-13 NOTE — Patient Instructions (Signed)
Follow up visit in 3 months. Call Dr. Fransico MichaelBrennan when you start the Guardian sensor.

## 2017-08-13 NOTE — Progress Notes (Signed)
Subjective:  Patient Name: Scott Moran Date of Birth: May 20, 1991  MRN: [id number]  Scott Moran  presents to the office today for follow-up of his type 1 diabetes mellitus, goiter, hypoglycemia, depression, social anxiety disorder, avoidant personality disorder, hypothyroidism, thyroiditis, diabetic autonomic neuropathy, inappropriate sinus tachycardia, non-proliferative diabetic retinopathy, diabetic peripheral neuropathy, fatigue, hypogonadotropic hypogonadism, and medical non-compliance.  HISTORY OF PRESENT ILLNESS:   Scott Moran is a 26 y.o. Caucasian young man. Scott Moran was unaccompanied.   1. I first saw the patient in consultation on 03/27/05 in the [facility] in [place], [name]. He had been referred by his primary care provider, Dr. Wendie Moran, in [place], [place], for evaluation and treatment of poorly controlled type 1 diabetes. The patient was 26 years old.  A. The patient had been diagnosed with type 1 diabetes in February of 1995 at age 13. He was on an insulin pump when I first met him. His hemoglobin A1c had increased from 7.1% in December of 2005 to 8.2% in April 2006. He was having frequent snacks that he was not taking boluses for. He was also frequently noncompliant with checking blood sugars and taking meal boluses. I adjusted his basal rates.  B. At his next [name] visit on 06/19/05, he was still very noncompliant. His mental status was normal. He had a 25 gram goiter. His father was serving in [place] with the [employer]. There was a lot of stress at home. I again adjusted his basal rates.  C. At the end of July 2006 we closed our satellite office in [place]. The patient elected to be followed at our clinic here in [place]. I have followed him here ever since.  2. During the past twelve years, the patient has had several clinical issues.   A. T1DM: The patient's blood glucose control has usually been fairly good for a  teenager/young adult. His maximum hemoglobin A1c occurred on 04/16/06 when the hemoglobin A1c was 12.1%. Since then the hemoglobin A1c values have varied between 6.9% and 8.6%. He is still fairly noncompliant with checking blood sugars, bolusing, and changing his insulin pump sites on time, but he is more compliant and more careful than he used to be. He has never required hospital re-admission for DKA, severe hyperglycemia, or hypoglycemia.   B. Goiter, Hashimoto's thyroiditis, and hypothyroidism: In late 2008 and early 2009 the patient developed hypothyroidism secondary to Hashimoto's disease. I started him on Synthroid, 25 mcg per day. When he takes his medication, he is euthyroid.  C. Autonomic neuropathy and inappropriate sinus tachycardia: In late 2008 he developed autonomic neuropathy which manifested as inappropriate sinus tachycardia. These problems varied in parallel with his BGs and HbA1c values.  D. Diabetic peripheral neuropathy: The patient has had symptoms and signs of mild diabetic peripheral neuropathy at several times in the past. His peripheral neuropathic signs and symptoms similarly varied in parallel with his BGs and HbA1c values.   E. Non-proliferative diabetic retinopathy: This problem was identified during a diabetic eye exam in June 2010.  F. Hypogonadotropic hypogonadism: The patient has always been somewhat lackadaisical and relatively passive. On 06/29/10 his FSH was 4.9, LH was 2.4, testosterone was 399.66, and free testosterone was 79.8. The testosterone and free testosterone were relatively low for an 26 year old young man. At the same time the [facility] and LH while "normal", appeared to be actually inappropriately low for the level of testosterone he had. We discussed the options for treatment including exercise and medication, to include AndroGel. The  patient chose the exercise option. By 10/19/10 the [facility] was 6.8, the LH was 3.2, total testosterone was 615.88 and the free testosterone  was 131.6. In retrospect, when he was more depressed he may have had less gonadotropic function. We will continue to follow up on this issue over time.  G. Depression/Anxiety/Personality disorders:    1). The patient has been diagnosed with depression at different times in the past. For some time he took Remeron (mirtazapine), but discontinued that medication in early 2012. I had been trying to convince him for some time that he was depressed and that he should seek out help. He eventually did decide to seek psychiatric help.    2). On 06/08/13 Eliodoro was evaluated by [name], a noted psychiatrist in [place].  [name] and his staff performed neuropsychiatric testing. On the RONDAL VANDEVELDE had problems with non-verbal reasoning and emotional intelligence. 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 [name] prescribed escitalopram, 10 mg/day. Anthon was supposed to continue to see [name] in follow up. Unfortunately, Mehar did not follow up.    3). Scott Moran was then referred to a therapist, [name]. Nickalaus enjoys working with Dr. [name] and feels that he is making progress.    H. 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   3. The patient's last [facility] visit was on 06/04/17. At that visit we continued his insulin pump settings. In the interim he has been healthy.   A. He is now taking on-site courses at [employer] and one on-line course. Elman is very enthusiastic about taking courses at [employer].   B. He saw Mr. Leafy Ro on 06/06/17 for a consultation on the Guardian sensor. However, Weaver chose not to start the sensor until after he gets used to his new schedule at [facility] [employer] [facility] [employer].   Scott Moran has seen [name], his therapist, recently. Dr. Lorie Apley practice can't order anti-depressants for him.    Juanda Bond takes his Synthroid dose of 25 mcg/day every night. He is not taking lisinopril and escitalopram regularly.   E. He has his learner's permit and is learning to drive. He does not feel ready yet to drive on his own. He is severely afraid of other drivers that are terrible.   4. Pertinent Review of Systems:  Constitutional: Mubashir feels "pretty great". He feels that he is not depressed. His allergies are not acting up much. He is enjoying college.   Eyes: Vision is good with his glasses. There are no significant eye complaints. Last eye exam was in November 2017. There were no signs of diabetic retinopathy. Mouth: No problems.  Neck: The patient has no complaints of anterior neck swelling, soreness, tenderness,  pressure, discomfort, or difficulty swallowing.  Heart: Heart rate increases with exercise or other physical activity. The patient has no complaints of palpitations, irregular heat beats, chest pain, or chest pressure. Gastrointestinal: No postprandial bloating. Bowel movents seem normal. The patient has no other complaints of excessive hunger, acid reflux, upset stomach, stomach aches or pains, diarrhea, or constipation. Legs: Muscle mass and strength seem normal. There are no complaints of numbness, tingling, burning, or pain. No edema is noted. Feet: There are no obvious foot problems. There are no complaints of numbness, tingling, burning, or pain. No edema is noted. Hypoglycemia: He has had a few low BGs recently. None  were severe.   Emotional/psychological: The patient feels that he is "doing good". He is still not regularly taking the escitalopram that he asked me to prescribe for him. .  5. BG printout: He is changing his sites every 4-6 days. BGs usually exceed 400 on days 4 of the sites. He is checking BGs 1-4 times per day, typically 2-3 times per day. He boluses 4-8 times per day, but many of the boluses are only food boluses. Average BG is 236, compared with 222 at  his last visit. BG range is 53 to >400, compared with  54 to >400 at his last visit. The  longer he leaves sites in and the longer he goes between BG checks and insulin boluses, the more variable his BGs will be. He had five low BGs in the past 4 weeks, four in the 50s and one in the 60s. Three of the four 50s occurred after large boluses late at night. One low BG occurred when he did not check his BG before a meal and then dropped low after the meal. All of his BGs >400 occurred when his sites were bad.   6. Sensor printout: None   PAST MEDICAL, FAMILY, AND SOCIAL HISTORY:  Past Medical History:  Diagnosis Date  . DM type 1 with diabetic peripheral neuropathy ([place])   . Goiter   . Hypertension   . Hypoglycemia associated with diabetes ([place])   . Hypogonadotropic hypogonadism ([place])   . Hypothyroidism, acquired, autoimmune   . Tachycardia   . Thyroiditis, autoimmune   . Type 1 diabetes mellitus not at goal [facility])   . Type 1 diabetes mellitus with diabetic autonomic neuropathy ([facility])     Family History  Problem Relation Age of Onset  . Diabetes Maternal Uncle   . Hypertension Maternal Grandmother   . Thyroid disease Neg Hx   . Obesity Neg Hx   . Kidney disease Neg Hx   . Cancer Neg Hx   . Anemia Neg Hx      Current Outpatient Prescriptions:  .  acetone, urine, test strip, Check ketones per protocol, Disp: 50 each, Rfl: 3 .  glucose blood (BAYER CONTOUR NEXT TEST) test strip, Check sugar 10 x daily, Disp: 300 each, Rfl: 3 .  insulin aspart (NOVOLOG) 100 UNIT/ML injection, Use up to 300 units in insulin pump every 48 hours., Disp: 4 vial, Rfl: 5 .  levothyroxine (SYNTHROID, LEVOTHROID) 25 MCG tablet, TAKE 1 TABLET BY MOUTH DAILY BEFORE BREAKFAST, Disp: 30 tablet, Rfl: 5 .  escitalopram (LEXAPRO) 10 MG tablet, TAKE 1 TABLET BY MOUTH ONCE DAILY (Patient not taking: Reported on 08/13/2017), Disp: 30 tablet, Rfl: 4 .  glucagon (GLUCAGON EMERGENCY) 1 MG injection, Inject 1 mg intramuscularly  in thigh 1 time for severe hypoglycemia if unresponsive, unconscious, unable to swallow or has a seizure., Disp: 2 kit, Rfl: 3 .  ondansetron (ZOFRAN ODT) 4 MG disintegrating tablet, Take 1 tablet (4 mg total) by mouth every 8 (eight) hours as needed for nausea or vomiting. (Patient not taking: Reported on 10/30/2016), Disp: 20 tablet, Rfl: 0 .  promethazine (PHENERGAN) 25 MG tablet, Take 1 tablet (25 mg total) by mouth every 6 (six) hours as needed for nausea or vomiting. (Patient not taking: Reported on 10/30/2016), Disp: 20 tablet, Rfl: 0  Allergies as of 08/13/2017  . (No Known Allergies)    1. Work and Family: Patient is now taking on-site courses at [employer] and one on-line course.   2. Activities: Few physical  activities. He is a history buff and frequently participates in online forum discussions about historical and political topics. 3. Smoking, alcohol, or drugs: None 4. Primary Care Provider: He will soon see mom's primary care provider who has agreed to prescribe escitalopram for Duquan.  5. Psychiatry: He no longer has a psychiatrist.  6. Psychologist: [name]  REVIEW OF SYSTEMS: There are no other significant problems involving Karmine's other body systems.   Objective:  Vital Signs:  BP 122/76   Pulse 90   Wt 146 lb (66.2 kg)   BMI 22.92 kg/m  HR 1`   Ht Readings from Last 3 Encounters:  01/31/17 5' 6.93" (1.7 m)  09/15/15 _0  (1.753 m)  08/22/13 _1  (1.702 m)   Wt Readings from Last 3 Encounters:  08/13/17 146 lb (66.2 kg)  06/06/17 147 lb (66.7 kg)  06/04/17 145 lb 9.6 oz (66 kg)   PHYSICAL EXAM: Constitutional: Georg was Moran and cognitively normal today. His affect was very upbeat today. He was talking almost non-stop. His insight is fair. His ability to control his own thoughts and actions is better today. His weight has decreased 1 pound since last visit. He has been inconsistent in his DM care again this past month. Today he wanted to spend the  entire visit talking about the people that he met at [facility]. I had to re-direct him multiple times.  Face: The face appears normal.  Eyes: There is no obvious arcus or proptosis. Moisture appears normal. Mouth: The oropharynx and tongue appear normal. Oral moisture is normal. Neck: The neck looks normal. No carotid bruits are noted. The thyroid gland is again mildly enlarged, but smaller at 20-21 grams in size today. Both lobes are symmetrically enlarged. The consistency of the thyroid gland is normal today.The thyroid gland is not tender to palpation.  Lungs: The lungs are clear to auscultation. Air movement is good. Heart: Heart rate remains tachycardic. Heart rhythm is regular. Heart sounds S1 and S2 are normal. I did not appreciate any pathologic cardiac murmurs. Abdomen: The abdomen is normal in size, but larger. Bowel sounds are normal. There is no obvious hepatomegaly, splenomegaly, or other mass effect.  Arms: Muscle size and bulk are normal for age. Hands: There is no obvious tremor. Phalangeal and metacarpophalangeal joints are normal. Palmar muscles are normal. Palmar skin is normal. Palmar moisture is also normal. Legs: Muscles appear normal for age. No edema is present. Feet: Feet are normally formed. Dorsalis pedal pulses are 1+ bilaterally.   Neurologic: Strength is normal for age in both the upper and lower extremities. Muscle tone is normal. Sensation to touch is normal in both legs, but slightly decreased to touch in the heels.     LAB DATA:   Results for orders placed or performed in visit on 08/13/17 (from the past 504 hour(s))  POCT Glucose (Device for Home Use)   Collection Time: 08/13/17  2:06 PM  Result Value Ref Range   Glucose Fasting, POC  70 - 99 mg/dL   POC Glucose 354 (A) 70 - 99 mg/dl   Labs 08/13/17: CBG 354  Labs 06/04/17: CBG 173; BMP normal  Labs 05/29/17: HbA1c 8.2%; TSH 1.84, free T4 1.2, free T3 3.4, CMP normal except for glucose 310 and potassium 5.8; LH  3.7, FSH 5.9, testosterone 634, free testosterone 76 (ref 46-224), estradiol 31; microalbumin/creatinine ratio 4; cholesterol 187, triglycerides 63, HDL 59, LDL 116  Labs 04/03/17: CBG 182  Labs 01/31/17: HbA1c 7.6%, CBG  341  Labs 10/30/16: HbA1c 7.5%  Labs 07/25/16: HbA1c 7.8%  Labs 04/25/16: HbA1c 7.7%  Labs 04/09/16: LH 3.4, FSH 6.0, testosterone 504, free testosterone 85.5 (ref 47-244); cholesterol 176, triglycerides 92, HDL 80, LDL 88; CMP norma except for glucose 224; TSH 1.75, free T4 1.2, free T3 3.2; microalbumin/creatinine ratio 4  Labs 01/24/16: HbA1c 8.6%  Labs 10/20/15: HbA1c 8.1%  Labs 09/15/15: BMP normal except for glucose of 143 and calcium 8.3.  Labs 07/20/15: HbA1c 8.2%  Labs 03/25/15: HbA1c 7.8%  Labs 12/02/14: HbA1c 8.5%;  LH 4.0, FSH 8.2, testosterone 621.97; CMP normal; TSH 2.01, free T4 1.21, free T3 3.6; cholesterol 155, triglycerides 46, HDL  55, LDL 91; microalbumin/creatinine ratio 6.6  Labs 03/30/14: TSH 1.935, free T4 1.12, free T3 3.2; CMP: normal except for glucose 203; testosterone 624, FSH 6.3, LH 2.4; urine microalbumin/creatinine ratio 6.4; cholesterol 163, triglycerides 55, HDL 64, LDL 80  Labs 08/23/13: TSH 1.139; CMP normal except for a glucose of 145; CBC: WBC 5.4, Hgb 12.7, Hct 34.7%   Assessment and Plan:   ASSESSMENT:  1-2. Type 1 diabetes mellitus/hypoglycemia:   A. Anthonio has continued to be inconsistent about checking BGs and giving boluses. He has not used his sensor in the past 2 months.  B. His BGs have generally been higher this past month.  C. He had five hypoglycemic BGs, three of which occurred after taking several boluses in the evening and then not checking BGs at bedtime. He remain inconsistent about checking his  BGs at bedtime.  3. Autonomic neuropathy and inappropriate sinus tachycardia: These problems are about the same. Fortunately, these problems will reverse with better BG control.   4. Peripheral neuropathy: This problem is  still present.    5. Hypothyroid: He was euthyroid in December 2015, in May 2017, and again in June 2018 on his current dose of Synthroid. 6. Hypogonadotropic hypogonadism: His testosterone level in May 2017 was acceptable, but was lower than in December 2015. His testosterone level in June 2018 was good, but his free testosterone is relatively low for his age.  Depression still seems to be a major factor in his testosterone levels.  7. Hypertension: His SBPs is good, but his DBP is too high. He needs to take his lisinopril and exercise regularly.  8. Goiter/Hashimoto's disease: His goiter is again mildly enlarged, but slightly smaller today. The pattern of waxing and waning of thyroid gland size is c/w evolving thyroiditis. The thyroiditis is clinically quiescent 9. Depression: This problem is much, much better today. Going back to school has been a major plus for him. He does not exhibit any suicidal thoughts or emotions. He needs to continue to see Dr. [name] and to re-establish care with a psychiatrist. He also needs to take his escitalopram. Since he does not have a psychiatrist or a PCP to prescribe escitalopram, I had agreed to prescribe it for 30 days with 2 refills. That would have allowed him to find a psychiatrist or PCP. He says that he will have new PCP soon. He is not interested in finding another psychiatrist.  10. Noncompliance: He was doing better immediately after starting the new sensor-pump combination, but has not done as well since then. I still continue to hope that as Kinsley ages and matures, he will do a better job of taking care of his T1DM and his life. Perhaps the escitalopram will help, if he takes it.  11. Weight loss, unintentional: This problem has resolved.  12. Hypokalemia: The repeat BMP on 06/04/17 was very normal.    PLAN:  1. Diagnostic: I reviewed his annual surveillance lab results with him today. CBG today.  2. Therapeutic: Continue Synthroid. Resume lisinopril  and escitalopram. Give both correction boluses and food boluses. Continue his current basal rates:  MN: 0.800 5 AM: 0.800 8 AM: 0.650 Noon: 0.900 6 PM: 1.600 9 PM: 1.300 3. Patient education: Kourtland is a very intelligent young man who knows intellectually what he should do and how to do it. Unfortunately, in the past he has lacked the commitment to taking positive actions about his healthcare and about his life in general. He has been mired in his own depression and personality disorders. He is much more upbeat today. If he will start using the Guardian 3 sensor soon and take advantage of all the benefits of the Medtronic 670G pump and sensor combination, then his BG control can be much better. 4. Follow-up: 3 months   Level of Service: This visit lasted in excess of 50 minutes. More than 50% of the visit was devoted to counseling.  [name] Sers, MD, CDE Adult and Pediatric Endocrinology 08/13/2017 2:44 PM

## 2017-10-07 ENCOUNTER — Telehealth (INDEPENDENT_AMBULATORY_CARE_PROVIDER_SITE_OTHER): Payer: Self-pay | Admitting: "Endocrinology

## 2017-10-07 NOTE — Telephone Encounter (Signed)
°  Who's calling (name and relationship to patient) : Loraine LericheMark - father  Best contact number: [phone]  Provider they see: Fransico MichaelBrennan  Reason for call: [name] will be termed from father's insurance due to age next month and so Loraine LericheMark would like to know if Dr. Fransico MichaelBrennan has any information or issues with them obtaining Insulin from [place]. [place] prices are cheaper. [name] will not qualify for Medicaid and they are looking into private insurance options.     PRESCRIPTION REFILL ONLY  Name of prescription:  Pharmacy:

## 2017-10-07 NOTE — Telephone Encounter (Signed)
Returned TC to father Loraine LericheMark, to inform that I will talk with Dr. Fransico MichaelBrennan about the insulin from [place], if the same company makes it I dont see a reason why he would not ok, but I will call once I talk to him.

## 2017-10-07 NOTE — Telephone Encounter (Signed)
°  Who's calling (name and relationship to patient) : Loraine LericheMark (mom) Best contact number: [phone] Provider they see: Fransico MichaelBrennan  Reason for call: Dad returning call again about coverage for insurance before January and would like to speak with Dr Fransico MichaelBrennan on what to do.     PRESCRIPTION REFILL ONLY  Name of prescription:  Pharmacy:

## 2017-10-08 ENCOUNTER — Telehealth (INDEPENDENT_AMBULATORY_CARE_PROVIDER_SITE_OTHER): Payer: Self-pay | Admitting: "Endocrinology

## 2017-10-08 NOTE — Telephone Encounter (Signed)
1. Dad requested that I call him. I did. 2. He wanted to know if NovoRapid which is available in [place] is equivalent to NovoLog. I looked up the question in several web sites and found that the two insulins are the same.  3. Dad would like me to write a prescription for NovoRapid for [place]. [name] will be going off his parent's insurance later this month, so the family is trying to find less expensive options for insulin therapy. He typically needs 4 vials per month.  Molli KnockMichael Brennan, MD, CDE

## 2017-10-09 ENCOUNTER — Telehealth (INDEPENDENT_AMBULATORY_CARE_PROVIDER_SITE_OTHER): Payer: Self-pay | Admitting: "Endocrinology

## 2017-10-09 NOTE — Telephone Encounter (Signed)
TC to dad Loraine LericheMark to advise will put rx in mail as requested.

## 2017-10-09 NOTE — Telephone Encounter (Signed)
°  Who's calling (name and relationship to patient) : Psychologist, counsellingMark Cantara (Father) Best contact number: [phone] Provider they see: Fransico MichaelBrennan, MD Reason for call: Father and mother of patient called today stating that Dr Fransico MichaelBrennan agreed to change patient Scott Moran to the NovoRapid. He has now requested the prescription to be mailed if possible.

## 2017-10-16 NOTE — Telephone Encounter (Signed)
See other note from this date. 

## 2017-10-23 DIAGNOSIS — E109 Type 1 diabetes mellitus without complications: Secondary | ICD-10-CM | POA: Diagnosis not present

## 2017-10-23 DIAGNOSIS — E108 Type 1 diabetes mellitus with unspecified complications: Secondary | ICD-10-CM | POA: Diagnosis not present

## 2017-10-23 DIAGNOSIS — Z794 Long term (current) use of insulin: Secondary | ICD-10-CM | POA: Diagnosis not present

## 2017-10-30 ENCOUNTER — Ambulatory Visit (INDEPENDENT_AMBULATORY_CARE_PROVIDER_SITE_OTHER): Payer: BLUE CROSS/BLUE SHIELD | Admitting: "Endocrinology

## 2017-10-30 ENCOUNTER — Encounter (INDEPENDENT_AMBULATORY_CARE_PROVIDER_SITE_OTHER): Payer: Self-pay | Admitting: "Endocrinology

## 2017-10-30 VITALS — BP 112/70 | HR 112 | Wt 145.6 lb

## 2017-10-30 DIAGNOSIS — R000 Tachycardia, unspecified: Secondary | ICD-10-CM | POA: Diagnosis not present

## 2017-10-30 DIAGNOSIS — E1042 Type 1 diabetes mellitus with diabetic polyneuropathy: Secondary | ICD-10-CM

## 2017-10-30 DIAGNOSIS — I10 Essential (primary) hypertension: Secondary | ICD-10-CM

## 2017-10-30 DIAGNOSIS — Z23 Encounter for immunization: Secondary | ICD-10-CM | POA: Diagnosis not present

## 2017-10-30 DIAGNOSIS — E063 Autoimmune thyroiditis: Secondary | ICD-10-CM

## 2017-10-30 DIAGNOSIS — F329 Major depressive disorder, single episode, unspecified: Secondary | ICD-10-CM

## 2017-10-30 DIAGNOSIS — E1065 Type 1 diabetes mellitus with hyperglycemia: Secondary | ICD-10-CM | POA: Diagnosis not present

## 2017-10-30 DIAGNOSIS — R634 Abnormal weight loss: Secondary | ICD-10-CM | POA: Diagnosis not present

## 2017-10-30 DIAGNOSIS — IMO0001 Reserved for inherently not codable concepts without codable children: Secondary | ICD-10-CM

## 2017-10-30 DIAGNOSIS — E049 Nontoxic goiter, unspecified: Secondary | ICD-10-CM

## 2017-10-30 DIAGNOSIS — E10649 Type 1 diabetes mellitus with hypoglycemia without coma: Secondary | ICD-10-CM

## 2017-10-30 DIAGNOSIS — E1043 Type 1 diabetes mellitus with diabetic autonomic (poly)neuropathy: Secondary | ICD-10-CM | POA: Diagnosis not present

## 2017-10-30 DIAGNOSIS — F32A Depression, unspecified: Secondary | ICD-10-CM

## 2017-10-30 LAB — POCT GLUCOSE (DEVICE FOR HOME USE): POC Glucose: 147 mg/dl — AB (ref 70–99)

## 2017-10-30 LAB — POCT GLYCOSYLATED HEMOGLOBIN (HGB A1C): Hemoglobin A1C: 9

## 2017-10-30 NOTE — Progress Notes (Signed)
Subjective:  Patient Name: Scott Moran Date of Birth: Feb 05, 1991  MRN: [id number]  Scott Moran  presents to the office today for follow-up of his type 1 diabetes mellitus, goiter, hypoglycemia, depression, social anxiety disorder, avoidant personality disorder, hypothyroidism, thyroiditis, diabetic autonomic neuropathy, inappropriate sinus tachycardia, non-proliferative diabetic retinopathy, diabetic peripheral neuropathy, fatigue, hypogonadotropic hypogonadism, and medical non-compliance.  HISTORY OF PRESENT ILLNESS:   Scott Moran is a 26 y.o. Caucasian young man. Scott Moran was unaccompanied.   1. I first saw the patient in consultation on 03/27/05 in the [facility] in [place], [place]. He had been referred by his primary care provider, [name], in [place], [place], for evaluation and treatment of poorly controlled type 1 diabetes. The patient was 26 years old.  A. The patient had been diagnosed with type 1 diabetes in February of 1995 at age 32. He was on an insulin pump when I first met him. His hemoglobin A1c had increased from 7.1% in December of 2005 to 8.2% in April 2006. He was having frequent snacks that he was not taking boluses for. He was also frequently noncompliant with checking blood sugars and taking meal boluses. I adjusted his basal rates.  B. At his next [place] visit on 06/19/05, he was still very noncompliant. His mental status was normal. He had a 25 gram goiter. His father was serving in [place] with the [employer]. There was a lot of stress at home. I again adjusted his basal rates.  C. At the end of July 2006 we closed our satellite office in [place]. The patient elected to be followed at our clinic here in [place]. I have followed him here ever since.  2. During the past twelve years, the patient has had several clinical issues.   A. T1DM: The patient's blood glucose control has usually been fairly good for a  teenager/young adult. His maximum hemoglobin A1c occurred on 04/16/06 when the hemoglobin A1c was 12.1%. Since then the hemoglobin A1c values have varied between 6.9% and 8.6%, until his HbA1c on 10/30/17 of 9.0%. He is still fairly noncompliant with checking blood sugars, bolusing, and changing his insulin pump sites on time, but he is more compliant and more careful than he used to be. He has never required hospital re-admission for DKA, severe hyperglycemia, or hypoglycemia.   B. Goiter, Hashimoto's thyroiditis, and hypothyroidism: In late 2008 and early 2009 the patient developed hypothyroidism secondary to Hashimoto's disease. I started him on Synthroid, 25 mcg per day. When he takes his medication, he is euthyroid.  C. Autonomic neuropathy and inappropriate sinus tachycardia: In late 2008 he developed autonomic neuropathy which manifested as inappropriate sinus tachycardia. These problems have varied in parallel with his BGs and HbA1c values.  D. Diabetic peripheral neuropathy: The patient has had symptoms and signs of mild diabetic peripheral neuropathy at several times in the past. His peripheral neuropathic signs and symptoms have similarly varied in parallel with his BGs and HbA1c values.   E. Non-proliferative diabetic retinopathy: This problem was identified during a diabetic eye exam in June 2010.  F. Hypogonadotropic hypogonadism: The patient has always been somewhat lackadaisical and relatively passive. On 06/29/10 his FSH was 4.9, LH was 2.4, testosterone was 399.66, and free testosterone was 79.8. The testosterone and free testosterone were relatively low for an 26 year old young man. At the same time the [facility] and LH while "normal", appeared to be actually inappropriately low for the level of testosterone he had. We discussed the options for  treatment including exercise and medication, to include AndroGel. The patient chose the exercise option. By 10/19/10 the [facility] was 6.8, the LH was 3.2, total  testosterone was 615.88 and the free testosterone was 131.6. In retrospect, when he was more depressed he may have had less gonadotropic function.   G. Depression/Anxiety/Personality disorders:    1). The patient has been diagnosed with depression at different times in the past. For some time he took Remeron (mirtazapine), but discontinued that medication in early 2012. I had been trying to convince him for some time that he was depressed and that he should seek out help. He eventually did decide to seek psychiatric help.    2). On 06/08/13 Scott Moran was evaluated by [name], a noted psychiatrist in [place].  [name] and his staff performed neuropsychiatric testing. On the BJORN HALLAS had problems with non-verbal reasoning and emotional intelligence. 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 [name] prescribed escitalopram, 10 mg/day. Scott Moran was supposed to continue to see [name] in follow up. Unfortunately, [facility] did not follow up.    3). Scott Moran was then referred to a therapist, [name]. Mekhi enjoys working with Dr. [name] and feels that he has made progress over time.    H. 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   3. The patient's last [facility] visit was on 08/13/17. At that visit we continued his insulin pump settings and his Synthroid. He was supposed to resume his escitalopram and lisinopril, but did not.  In the interim he has been healthy, except for a URI that cause him to miss two days of class recently.   A. He is now taking on-site courses at [employer] and is very busy. Scott Moran is very enthusiastic about taking courses at [employer].   B. He saw Mr. Leafy Ro on 06/06/17 for a consultation on the Guardian sensor. However, Scott Moran chose not to start the sensor until after he gets  used to his new schedule at [employer]. He is still not ready to start the sensor.   Scott Moran has not seen Dr. [name], his therapist, due to conflicts with his academic schedule.    Juanda Bond takes his Synthroid dose of 25 mcg/day every night. He is not taking lisinopril and escitalopram.   E. He has his learner's permit and is learning to drive. He still does not feel ready yet to drive on his own. He is severely afraid of other drivers that are terrible.   4. Pertinent Review of Systems:  Constitutional: Neziah feels "pretty okay overall". He feels that he is not depressed. His allergies are not acting up much. He is enjoying college.   Eyes: Vision is good with his glasses. There are no significant eye complaints. Last eye exam was in November 2017. There were no signs of diabetic retinopathy. Mouth: No problems.  Neck: The patient has no complaints of anterior neck swelling, soreness, tenderness,  pressure, discomfort, or difficulty swallowing.  Heart: Heart rate increases with exercise or other physical activity. The patient has no complaints of palpitations, irregular heat beats, chest pain, or chest pressure. Gastrointestinal: No postprandial bloating. Bowel movents seem normal. The patient has no other complaints of excessive hunger, acid reflux, upset stomach, stomach aches or pains, diarrhea, or constipation. Legs: Muscle mass and strength seem normal. There are no complaints of  numbness, tingling, burning, or pain. No edema is noted. Feet: There are no obvious foot problems. There are no complaints of numbness, tingling, burning, or pain. No edema is noted. Hypoglycemia: He has had "a few" low BGs recently. None were severe.   Emotional/psychological: The patient feels that he is "doing okay".  .  5. BG printout: He is changing his sites every 6 days. BGs often exceed 400 on days 3-6 of the sites. He is checking BGs 1-15 times per day. He boluses 1-6 times per day, but most of the boluses are  only food boluses. Average BG is 274, compared with 236 at his last visit. BG range is 58 to >400, compared with 53 to >400 at his last visit. The  longer he leaves sites in and the longer he goes between BG checks and insulin boluses, the more variable his BGs will be. He had 3 BGs <80 in the past 2 weeks. He had 12 BGs >400, 11 of which occurred due to bad sites.    6. Sensor printout: None   PAST MEDICAL, FAMILY, AND SOCIAL HISTORY:  Past Medical History:  Diagnosis Date  . DM type 1 with diabetic peripheral neuropathy ([place])   . Goiter   . Hypertension   . Hypoglycemia associated with diabetes ([place])   . Hypogonadotropic hypogonadism ([place])   . Hypothyroidism, acquired, autoimmune   . Tachycardia   . Thyroiditis, autoimmune   . Type 1 diabetes mellitus not at goal [facility])   . Type 1 diabetes mellitus with diabetic autonomic neuropathy ([facility])     Family History  Problem Relation Age of Onset  . Diabetes Maternal Uncle   . Hypertension Maternal Grandmother   . Thyroid disease Neg Hx   . Obesity Neg Hx   . Kidney disease Neg Hx   . Cancer Neg Hx   . Anemia Neg Hx      Current Outpatient Medications:  .  acetone, urine, test strip, Check ketones per protocol, Disp: 50 each, Rfl: 3 .  glucose blood (BAYER CONTOUR NEXT TEST) test strip, Check sugar 10 x daily, Disp: 300 each, Rfl: 3 .  insulin aspart (NOVOLOG) 100 UNIT/ML injection, Use up to 300 units in insulin pump every 48 hours., Disp: 4 vial, Rfl: 5 .  levothyroxine (SYNTHROID, LEVOTHROID) 25 MCG tablet, TAKE 1 TABLET BY MOUTH DAILY BEFORE BREAKFAST, Disp: 30 tablet, Rfl: 5 .  escitalopram (LEXAPRO) 10 MG tablet, TAKE 1 TABLET BY MOUTH ONCE DAILY (Patient not taking: Reported on 08/13/2017), Disp: 30 tablet, Rfl: 4 .  glucagon (GLUCAGON EMERGENCY) 1 MG injection, Inject 1 mg intramuscularly in thigh 1 time for severe hypoglycemia if unresponsive, unconscious, unable to swallow or has a seizure., Disp: 2 kit, Rfl: 3 .  ondansetron  (ZOFRAN ODT) 4 MG disintegrating tablet, Take 1 tablet (4 mg total) by mouth every 8 (eight) hours as needed for nausea or vomiting. (Patient not taking: Reported on 10/30/2016), Disp: 20 tablet, Rfl: 0 .  promethazine (PHENERGAN) 25 MG tablet, Take 1 tablet (25 mg total) by mouth every 6 (six) hours as needed for nausea or vomiting. (Patient not taking: Reported on 10/30/2016), Disp: 20 tablet, Rfl: 0  Allergies as of 10/30/2017  . (No Known Allergies)    1. Work and Family: Patient is now taking on-site courses at [employer].   2. Activities: Few physical activities. He is a history buff and frequently participates in online forum discussions about historical and political topics. 3. Smoking, alcohol, or drugs: None  4. Primary Care Provider: He does not have a primary care provider yet.   5. Psychiatry: He no longer has a psychiatrist.  6. Psychologist: [name]  REVIEW OF SYSTEMS: There are no other significant problems involving Scott Moran's other body systems.   Objective:  Vital Signs:  BP 112/70   Pulse (!) 112   Wt 145 lb 9.6 oz (66 kg)   BMI 22.85 kg/m  HR 1`   Ht Readings from Last 3 Encounters:  01/31/17 5' 6.93" (1.7 m)  09/15/15 '5\' 9"'$  (1.753 m)  08/22/13 '5\' 7"'$  (1.702 m)   Wt Readings from Last 3 Encounters:  10/30/17 145 lb 9.6 oz (66 kg)  08/13/17 146 lb (66.2 kg)  06/06/17 147 lb (66.7 kg)   PHYSICAL EXAM: Constitutional: Scott Moran was Moran and cognitively normal today. His affect was very upbeat today. He was talking almost non-stop. His insight is fair. His ability to control his own thoughts and actions is better today. His weight has decreased 1/2 pound since last visit. He has been inconsistent in his DM care again this past month. Today he wanted to spend the entire visit talking about [place]. I had to re-direct him multiple times.  Face: The face appears normal.  Eyes: There is no  obvious arcus or proptosis. Moisture appears normal. Mouth: The oropharynx and tongue appear normal. Oral moisture is normal. Neck: The neck looks normal. No carotid bruits are noted. The thyroid gland is slightly more enlarged at 21 grams in size today. Both lobes are symmetrically enlarged. The consistency of the thyroid gland is normal today.The thyroid gland is not tender to palpation.  Lungs: The lungs are clear to auscultation. Air movement is good. Heart: Heart rate remains tachycardic. Heart rhythm is regular. Heart sounds S1 and S2 are normal. I did not appreciate any pathologic cardiac murmurs. Abdomen: The abdomen is normal in size, but larger. Bowel sounds are normal. There is no obvious hepatomegaly, splenomegaly, or other mass effect.  Arms: Muscle size and bulk are normal for age. Hands: There is no obvious tremor. Phalangeal and metacarpophalangeal joints are normal. Palmar muscles are normal. Palmar skin is normal. Palmar moisture is also normal. Legs: Muscles appear normal for age. No edema is present. Feet: Feet are normally formed. Dorsalis pedal pulses are 1+ bilaterally.   Neurologic: Strength is normal for age in both the upper and lower extremities. Muscle tone is normal. Sensation to touch is normal in both legs, but slightly decreased to touch in the left heel.     LAB DATA:   Results for orders placed or performed in visit on 10/30/17 (from the past 504 hour(s))  POCT Glucose (Device for Home Use)   Collection Time: 10/30/17  2:14 PM  Result Value Ref Range   Glucose Fasting, POC  70 - 99 mg/dL   POC Glucose 147 (A) 70 - 99 mg/dl  POCT HgB A1C   Collection Time: 10/30/17  2:22 PM  Result Value Ref Range   Hemoglobin A1C 9.0    Labs 10/30/17: HbA1c 9.0%, CBG 147  Labs 08/13/17: CBG 354  Labs 06/04/17: CBG 173; BMP normal  Labs 05/29/17: HbA1c 8.2%; TSH 1.84, free T4 1.2, free T3 3.4, CMP normal except for glucose 310 and potassium 5.8; LH 3.7, FSH 5.9,  testosterone 634, free testosterone 76 (ref 46-224), estradiol 31; microalbumin/creatinine ratio 4; cholesterol 187, triglycerides 63, HDL 59, LDL 116  Labs 04/03/17: CBG 182  Labs 01/31/17: HbA1c 7.6%, CBG 341  Labs 10/30/16: HbA1c 7.5%  Labs 07/25/16: HbA1c 7.8%  Labs 04/25/16: HbA1c 7.7%  Labs 04/09/16: LH 3.4, FSH 6.0, testosterone 504, free testosterone 85.5 (ref 47-244); cholesterol 176, triglycerides 92, HDL 80, LDL 88; CMP norma except for glucose 224; TSH 1.75, free T4 1.2, free T3 3.2; microalbumin/creatinine ratio 4  Labs 01/24/16: HbA1c 8.6%  Labs 10/20/15: HbA1c 8.1%  Labs 09/15/15: BMP normal except for glucose of 143 and calcium 8.3.  Labs 07/20/15: HbA1c 8.2%  Labs 03/25/15: HbA1c 7.8%  Labs 12/02/14: HbA1c 8.5%;  LH 4.0, FSH 8.2, testosterone 621.97; CMP normal; TSH 2.01, free T4 1.21, free T3 3.6; cholesterol 155, triglycerides 46, HDL  55, LDL 91; microalbumin/creatinine ratio 6.6  Labs 03/30/14: TSH 1.935, free T4 1.12, free T3 3.2; CMP: normal except for glucose 203; testosterone 624, FSH 6.3, LH 2.4; urine microalbumin/creatinine ratio 6.4; cholesterol 163, triglycerides 55, HDL 64, LDL 80  Labs 08/23/13: TSH 1.139; CMP normal except for a glucose of 145; CBC: WBC 5.4, Hgb 12.7, Hct 34.7%   Assessment and Plan:   ASSESSMENT:  1-2. Type 1 diabetes mellitus/hypoglycemia:   A. Scott Moran's HbA1c is higher today, the highest that it has been in the past 11 years. He has been more inconsistent about checking BGs and giving boluses. He has not used his sensor in the past 4 months.  B. His BGs have generally been much higher this past month.  C. He had three hypoglycemic BGs this month. All three followed boluses.  3. Autonomic neuropathy and inappropriate sinus tachycardia: These problems are worse, paralleling his increase in HbA1c.    4. Peripheral neuropathy: This problem is still present.    5. Hypothyroid: He was euthyroid in December 2015, in May 2017, and again in  June 2018 on his current dose of Synthroid. 6. Hypogonadotropic hypogonadism: His testosterone level in May 2017 was acceptable, but was lower than in December 2015. His testosterone level in June 2018 was good, but his free testosterone was relatively low for his age.  Depression still seems to be a major factor in his testosterone levels.  7. Hypertension: His SBP is good, but his DBP is still relatively high. He needs to take his lisinopril and exercise regularly.  8. Goiter/Hashimoto's disease: His goiter is again mildly enlarged and slightly larger today. The pattern of waxing and waning of thyroid gland size is c/w evolving thyroiditis. The thyroiditis is clinically quiescent 9. Depression: This problem seems to be better today. Going back to school has been a major plus for him. He does not exhibit any suicidal thoughts or emotions. He needs to continue to see Dr. [name] and to re-establish care with a psychiatrist. He also needs to take his escitalopram. Since he does not have a psychiatrist or a PCP to prescribe escitalopram, I had agreed to prescribe it for 30 days with 2 refills. That would have allowed him to find a psychiatrist or PCP. He says that he will have new PCP soon. He is not interested in finding another psychiatrist.  10. Noncompliance: He was doing better immediately after starting the new sensor-pump combination, but has not done as well since then. I still continue to hope that as Scott Moran ages and matures, he will do a better job of taking care of his T1DM and his life. Perhaps the escitalopram will help, if he takes it.  11. Weight loss, unintentional: This problem continues. He is underinsulinized.  12. Hypokalemia: The repeat BMP on 06/04/17 was normal.    PLAN:  1. Diagnostic: I reviewed his lab results from today.  2. Therapeutic: Continue Synthroid. Resume lisinopril and escitalopram. Give both correction boluses and food boluses. Continue his current basal rates:  MN:  0.800 5 AM: 0.800 8 AM: 0.650 Noon: 0.900 6 PM: 1.600 9 PM: 1.300 3. Patient education: Scott Moran is a very intelligent young man who knows intellectually what he should do and how to do it. Unfortunately, he continues to lack the commitment to taking positive actions about his DM care. If he will start using the Guardian 3 sensor soon and take advantage of all the benefits of the Medtronic 670G pump and sensor combination, then his BG control can be much better. 4. Follow-up: 3 months   Level of Service: This visit lasted in excess of 55 minutes. More than 50% of the visit was devoted to counseling.  [name] Sers, MD, CDE Adult and Pediatric Endocrinology 10/30/2017 2:28 PM

## 2017-10-30 NOTE — Patient Instructions (Signed)
Follow up visit in 3 months. 

## 2017-12-09 ENCOUNTER — Other Ambulatory Visit (INDEPENDENT_AMBULATORY_CARE_PROVIDER_SITE_OTHER): Payer: Self-pay | Admitting: "Endocrinology

## 2017-12-09 DIAGNOSIS — E038 Other specified hypothyroidism: Secondary | ICD-10-CM

## 2018-01-20 ENCOUNTER — Ambulatory Visit (INDEPENDENT_AMBULATORY_CARE_PROVIDER_SITE_OTHER): Payer: BLUE CROSS/BLUE SHIELD | Admitting: "Endocrinology

## 2018-01-20 ENCOUNTER — Encounter (INDEPENDENT_AMBULATORY_CARE_PROVIDER_SITE_OTHER): Payer: Self-pay | Admitting: "Endocrinology

## 2018-01-20 VITALS — BP 146/88 | HR 108 | Ht 67.44 in | Wt 150.4 lb

## 2018-01-20 DIAGNOSIS — F3341 Major depressive disorder, recurrent, in partial remission: Secondary | ICD-10-CM

## 2018-01-20 DIAGNOSIS — E1043 Type 1 diabetes mellitus with diabetic autonomic (poly)neuropathy: Secondary | ICD-10-CM | POA: Diagnosis not present

## 2018-01-20 DIAGNOSIS — Z9119 Patient's noncompliance with other medical treatment and regimen: Secondary | ICD-10-CM

## 2018-01-20 DIAGNOSIS — I10 Essential (primary) hypertension: Secondary | ICD-10-CM

## 2018-01-20 DIAGNOSIS — R000 Tachycardia, unspecified: Secondary | ICD-10-CM | POA: Diagnosis not present

## 2018-01-20 DIAGNOSIS — E1065 Type 1 diabetes mellitus with hyperglycemia: Secondary | ICD-10-CM | POA: Diagnosis not present

## 2018-01-20 DIAGNOSIS — E1042 Type 1 diabetes mellitus with diabetic polyneuropathy: Secondary | ICD-10-CM

## 2018-01-20 DIAGNOSIS — E063 Autoimmune thyroiditis: Secondary | ICD-10-CM

## 2018-01-20 DIAGNOSIS — IMO0001 Reserved for inherently not codable concepts without codable children: Secondary | ICD-10-CM

## 2018-01-20 DIAGNOSIS — Z91199 Patient's noncompliance with other medical treatment and regimen due to unspecified reason: Secondary | ICD-10-CM

## 2018-01-20 DIAGNOSIS — E10649 Type 1 diabetes mellitus with hypoglycemia without coma: Secondary | ICD-10-CM | POA: Diagnosis not present

## 2018-01-20 DIAGNOSIS — E230 Hypopituitarism: Secondary | ICD-10-CM | POA: Diagnosis not present

## 2018-01-20 DIAGNOSIS — E049 Nontoxic goiter, unspecified: Secondary | ICD-10-CM

## 2018-01-20 DIAGNOSIS — R634 Abnormal weight loss: Secondary | ICD-10-CM

## 2018-01-20 LAB — POCT GLUCOSE (DEVICE FOR HOME USE): POC GLUCOSE: 206 mg/dL — AB (ref 70–99)

## 2018-01-20 LAB — POCT GLYCOSYLATED HEMOGLOBIN (HGB A1C): Hemoglobin A1C: 8.4

## 2018-01-20 NOTE — Patient Instructions (Signed)
Follow up visit in 3 months. 

## 2018-01-20 NOTE — Progress Notes (Signed)
Subjective:  Patient Name: Scott Moran Date of Birth: 29-Jun-1991  MRN: [id number]  Darrol Keeler  presents to the office today for follow-up of his type 1 diabetes mellitus, goiter, hypoglycemia, depression, social anxiety disorder, avoidant personality disorder, hypothyroidism, thyroiditis, diabetic autonomic neuropathy, inappropriate sinus tachycardia, non-proliferative diabetic retinopathy, diabetic peripheral neuropathy, fatigue, hypogonadotropic hypogonadism, and medical non-compliance.  HISTORY OF PRESENT ILLNESS:   Takashi is a 27 y.o. Caucasian young man. Raquan was unaccompanied.   1. I first saw the patient in consultation on 03/27/05 in the [facility] in [place], [place]. He had been referred by his primary care provider, [name], in [place], [place], for evaluation and treatment of poorly controlled type 1 diabetes. The patient was 27 years old.  A. The patient had been diagnosed with type 1 diabetes in February of 1995 at age 27. He was on an insulin pump when I first met him. His hemoglobin A1c had increased from 7.1% in December of 2005 to 8.2% in April 2006. He was having frequent snacks that he was not taking boluses for. He was also frequently noncompliant with checking blood sugars and taking meal boluses. I adjusted his basal rates.  B. At his next [place] visit on 06/19/05, he was still very noncompliant. His mental status was normal. He had a 25 gram goiter. His father was serving in [place] with the [employer]. There was a lot of stress at home. I again adjusted his basal rates.  C. At the end of July 2006 we closed our satellite office in [place]. The patient elected to be followed at our clinic here in [place]. I have followed him here ever since.  2. During the past thirteen years, the patient has had several clinical issues.   A. T1DM: The patient's blood glucose control has usually been fairly good for a  teenager/young adult. His maximum hemoglobin A1c occurred on 04/16/06 when the hemoglobin A1c was 12.1%. Since then the hemoglobin A1c values have varied between 6.9% and 8.6%, until his HbA1c on 10/30/17 of 9.0%. He is still fairly noncompliant with checking blood sugars, bolusing, and changing his insulin pump sites on time, but he is more compliant and more careful than he used to be. He has never required hospital re-admission for DKA, severe hyperglycemia, or hypoglycemia.   B. Goiter, Hashimoto's thyroiditis, and hypothyroidism: In late 2008 and early 2009 the patient developed hypothyroidism secondary to Hashimoto's disease. I started him on Synthroid, 25 mcg per day. When he takes his medication, he is euthyroid.  C. Autonomic neuropathy and inappropriate sinus tachycardia: In late 2008 he developed autonomic neuropathy which manifested as inappropriate sinus tachycardia. These problems have varied in parallel with his BGs and HbA1c values.  D. Diabetic peripheral neuropathy: The patient has had symptoms and signs of mild diabetic peripheral neuropathy at several times in the past. His peripheral neuropathic signs and symptoms have similarly varied in parallel with his BGs and HbA1c values.   E. Non-proliferative diabetic retinopathy: This problem was identified during a diabetic eye exam in June 2010.  F. Hypogonadotropic hypogonadism: The patient has always been somewhat lackadaisical and relatively passive. On 06/29/10 his FSH was 4.9, LH was 2.4, testosterone was 399.66, and free testosterone was 79.8. The testosterone and free testosterone were relatively low for an 27 year old young man. At the same time the [facility] and LH while "normal", appeared to be actually inappropriately low for the level of testosterone he had. We discussed the options for  treatment including exercise and medication, to include AndroGel. The patient chose the exercise option. By 10/19/10 the [facility] was 6.8, the LH was 3.2, total  testosterone was 615.88 and the free testosterone was 131.6. In retrospect, when he was more depressed he may have had less gonadotropic function.   G. Depression/Anxiety/Personality disorders:    1). The patient has been diagnosed with depression at different times in the past. For some time he took Remeron (mirtazapine), but discontinued that medication in early 2012. I had been trying to convince him for some time that he was depressed and that he should seek out help. He eventually did decide to seek psychiatric help.    2). On 06/08/13 Binh was evaluated by [name], a noted psychiatrist in [place].  [name] and his staff performed neuropsychiatric testing. On the LAZLO TUNNEY had problems with non-verbal reasoning and emotional intelligence. 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 [name] prescribed escitalopram, 10 mg/day. Joffre was supposed to continue to see [name] in follow up. Unfortunately, Bodin did not follow up.    3). Jarmar was then referred to a therapist, [name]. Mahdi enjoys working with Dr. [name] and feels that he has made progress over time.    H. 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   3. The patient's last [facility] visit was on 10/30/17. At that visit we continued his insulin pump settings and his Synthroid. He was supposed to resume his escitalopram and lisinopril, but did not.  In the interim he has been healthy, except for a recent URI that cause him to miss one day of class recently.   A. He is now taking on-site courses at [employer] and is very busy. Cordae is very enthusiastic about taking courses at [employer]   B. He saw Mr. Leafy Ro on 06/06/17 for a consultation on the Guardian sensor. However, Germaine chose not to start the sensor until after he  gets used to his new schedule at [employer]. He is still not ready to start the sensor.   Rebeca Alert has not seen Dr. [name], his therapist, due to conflicts with his academic schedule.    Juanda Bond takes his Synthroid dose of 25 mcg/day every night. He is not taking lisinopril and escitalopram.   E. He has his learner's permit, but has not wanted to try driving. He is severely afraid of other drivers that are terrible.   4. Pertinent Review of Systems:  Constitutional: Makail feels "pretty good overall". He feels that he is not really depressed. His allergies are not acting up much. He is enjoying college.   Eyes: Vision is good with his glasses. There are no significant eye complaints. Last eye exam was in November 2017. There were no signs of diabetic retinopathy. He needs a follow up exam. Mouth: No problems.  Neck: The patient has no complaints of anterior neck swelling, soreness, tenderness,  pressure, discomfort, or difficulty swallowing.  Heart: Heart rate increases with exercise or other physical activity. The patient has no complaints of palpitations, irregular heat beats, chest pain, or chest pressure. Gastrointestinal: No postprandial bloating. Bowel movents seem normal. The patient has no other complaints of excessive hunger, acid reflux, upset stomach, stomach aches or pains, diarrhea, or constipation. Legs: Muscle mass and strength seem normal. There are no complaints of numbness, tingling,  burning, or pain. No edema is noted. Feet: There are no obvious foot problems. There are no complaints of numbness, tingling, burning, or pain. No edema is noted. Hypoglycemia: He has had "a few" low BGs recently. None were severe.   Emotional/psychological: The patient feels that he is "doing okay".   Diabetes identification: He has his pump on and a wallet card.  .  5. BG printout: He is changing his sites every 6-7 days. BGs occasionally exceed 400 on days 3-6 of the sites. He is checking BGs 0-8  times per day, for an average of 2.2 times per day. He boluses 1-6 times per day, but most of the boluses are only food boluses. Average BG is 246, compared with 274 at his last visit and with 236 at prior last visit. BG range is 70 to >400, compared with 58 to >400 at his last visit. The  longer he leaves sites in and the longer he goes between BG checks and insulin boluses, the more variable his BGs will be. He had 1 BG <80 in the past 4 weeks. He had 2 BGs >400, one of which was due to a bad site and one of which was due to not bolusing. .     6. Sensor printout: None   PAST MEDICAL, FAMILY, AND SOCIAL HISTORY:  Past Medical History:  Diagnosis Date  . DM type 1 with diabetic peripheral neuropathy ([place])   . Goiter   . Hypertension   . Hypoglycemia associated with diabetes ([place])   . Hypogonadotropic hypogonadism ([place])   . Hypothyroidism, acquired, autoimmune   . Tachycardia   . Thyroiditis, autoimmune   . Type 1 diabetes mellitus not at goal [facility])   . Type 1 diabetes mellitus with diabetic autonomic neuropathy ([facility])     Family History  Problem Relation Age of Onset  . Diabetes Maternal Uncle   . Hypertension Maternal Grandmother   . Thyroid disease Neg Hx   . Obesity Neg Hx   . Kidney disease Neg Hx   . Cancer Neg Hx   . Anemia Neg Hx      Current Outpatient Medications:  .  acetone, urine, test strip, Check ketones per protocol, Disp: 50 each, Rfl: 3 .  glucose blood (BAYER CONTOUR NEXT TEST) test strip, Check sugar 10 x daily, Disp: 300 each, Rfl: 3 .  insulin aspart (NOVOLOG) 100 UNIT/ML injection, Use up to 300 units in insulin pump every 48 hours., Disp: 4 vial, Rfl: 5 .  levothyroxine (SYNTHROID, LEVOTHROID) 25 MCG tablet, TAKE 1 TABLET BY MOUTH DAILY BEFORE BREAKFAST, Disp: 30 tablet, Rfl: 5 .  escitalopram (LEXAPRO) 10 MG tablet, TAKE 1 TABLET BY MOUTH ONCE DAILY (Patient not taking: Reported on 08/13/2017), Disp: 30 tablet, Rfl: 4 .  glucagon (GLUCAGON EMERGENCY) 1 MG  injection, Inject 1 mg intramuscularly in thigh 1 time for severe hypoglycemia if unresponsive, unconscious, unable to swallow or has a seizure., Disp: 2 kit, Rfl: 3 .  ondansetron (ZOFRAN ODT) 4 MG disintegrating tablet, Take 1 tablet (4 mg total) by mouth every 8 (eight) hours as needed for nausea or vomiting. (Patient not taking: Reported on 10/30/2016), Disp: 20 tablet, Rfl: 0 .  promethazine (PHENERGAN) 25 MG tablet, Take 1 tablet (25 mg total) by mouth every 6 (six) hours as needed for nausea or vomiting. (Patient not taking: Reported on 10/30/2016), Disp: 20 tablet, Rfl: 0  Allergies as of 01/20/2018  . (No Known Allergies)    1. Work and Family:  Patient is now taking on-site courses at [employer].   2. Activities: Few physical activities. He is a history buff and frequently participates in online forum discussions about historical and political topics. 3. Smoking, alcohol, or drugs: None 4. Primary Care Provider: He does not have a primary care provider yet.   5. Psychiatry: He no longer has a psychiatrist.  6. Psychologist: [name]  REVIEW OF SYSTEMS: There are no other significant problems involving Robert's other body systems.   Objective:  Vital Signs:  BP (!) 146/88   Pulse (!) 108   Ht 5' 7.44" (1.713 m)   Wt 150 lb 6.4 oz (68.2 kg)   BMI 23.25 kg/m  HR 1`   Ht Readings from Last 3 Encounters:  01/20/18 5' 7.44" (1.713 m)  01/31/17 5' 6.93" (1.7 m)  09/15/15 _0  (1.753 m)   Wt Readings from Last 3 Encounters:  01/20/18 150 lb 6.4 oz (68.2 kg)  10/30/17 145 lb 9.6 oz (66 kg)  08/13/17 146 lb (66.2 kg)   PHYSICAL EXAM: Constitutional: Rett was alert and cognitively normal today. His affect was very upbeat today. He was normally talkative, but not excessively so. His ability to control his own thoughts and actions is better today. His weight has increased 5 pounds since last visit. He has been inconsistent in his DM care again this past month.  Face: The face  appears normal.  Eyes: There is no obvious arcus or proptosis. Moisture appears normal. Mouth: The oropharynx and tongue appear normal. Oral moisture is normal. Neck: The neck looks normal. No carotid bruits are noted. The thyroid gland is smaller at about 20-20+ grams in size today. The left lobe is minimally enlarged, but the right lobe is normal. The consistency of the thyroid gland is normal today.The thyroid gland is not tender to palpation.  Lungs: The lungs are clear to auscultation. Air movement is good. Heart: Heart rate remains tachycardic. Heart rhythm is regular. Heart sounds S1 and S2 are normal. I did not appreciate any pathologic cardiac murmurs. Abdomen: The abdomen is larger. Bowel sounds are normal. There is no obvious hepatomegaly, splenomegaly, or other mass effect.  Arms: Muscle size and bulk are normal for age. Hands: There is no obvious tremor. Phalangeal and metacarpophalangeal joints are normal. Palmar muscles are normal. Palmar skin is normal. Palmar moisture is also normal. Legs: Muscles appear normal for age. No edema is present. Feet: Feet are normally formed. Dorsalis pedal pulses are 1-2+ bilaterally.   Neurologic: Strength is normal for age in both the upper and lower extremities. Muscle tone is normal. Sensation to touch is normal in both legs, but slightly decreased to touch in the right heel.     LAB DATA:   Results for orders placed or performed in visit on 01/20/18 (from the past 504 hour(s))  POCT Glucose (Device for Home Use)   Collection Time: 01/20/18 10:27 AM  Result Value Ref Range   Glucose Fasting, POC  70 - 99 mg/dL   POC Glucose 206 (A) 70 - 99 mg/dl  POCT HgB A1C   Collection Time: 01/20/18 10:36 AM  Result Value Ref Range   Hemoglobin A1C 8.4    Labs 01/20/18: HbA1c 8.4%, CBG 206  Labs 10/30/17: HbA1c 9.0%, CBG 147  Labs 08/13/17: CBG 354  Labs 06/04/17: CBG 173; BMP normal  Labs 05/29/17: HbA1c 8.2%; TSH 1.84, free T4 1.2, free T3 3.4,  CMP normal except for glucose 310 and potassium 5.8; LH 3.7, FSH  5.9, testosterone 634, free testosterone 76 (ref 46-224), estradiol 31; microalbumin/creatinine ratio 4; cholesterol 187, triglycerides 63, HDL 59, LDL 116  Labs 04/03/17: CBG 182  Labs 01/31/17: HbA1c 7.6%, CBG 341  Labs 10/30/16: HbA1c 7.5%  Labs 07/25/16: HbA1c 7.8%  Labs 04/25/16: HbA1c 7.7%  Labs 04/09/16: LH 3.4, FSH 6.0, testosterone 504, free testosterone 85.5 (ref 47-244); cholesterol 176, triglycerides 92, HDL 80, LDL 88; CMP norma except for glucose 224; TSH 1.75, free T4 1.2, free T3 3.2; microalbumin/creatinine ratio 4  Labs 01/24/16: HbA1c 8.6%  Labs 10/20/15: HbA1c 8.1%  Labs 09/15/15: BMP normal except for glucose of 143 and calcium 8.3.  Labs 07/20/15: HbA1c 8.2%  Labs 03/25/15: HbA1c 7.8%  Labs 12/02/14: HbA1c 8.5%;  LH 4.0, FSH 8.2, testosterone 621.97; CMP normal; TSH 2.01, free T4 1.21, free T3 3.6; cholesterol 155, triglycerides 46, HDL  55, LDL 91; microalbumin/creatinine ratio 6.6  Labs 03/30/14: TSH 1.935, free T4 1.12, free T3 3.2; CMP: normal except for glucose 203; testosterone 624, FSH 6.3, LH 2.4; urine microalbumin/creatinine ratio 6.4; cholesterol 163, triglycerides 55, HDL 64, LDL 80  Labs 08/23/13: TSH 1.139; CMP normal except for a glucose of 145; CBC: WBC 5.4, Hgb 12.7, Hct 34.7%   Assessment and Plan:   ASSESSMENT:  1-2. Type 1 diabetes mellitus/hypoglycemia:   A. Eddison's HbA1c is lower today, but still higher than it had been in the past several years. He has continued to be very inconsistent about checking BGs and giving boluses. He has not used his sensor in many months.  B. His documented BGs have been generally lower this past month.  C. He had one mild hypoglycemic BG this month.  3. Autonomic neuropathy and inappropriate sinus tachycardia: These problems are a little better, paralleling his decrease in HbA1c.    4. Peripheral neuropathy: This problem is still present.    5.  Hypothyroid: He was euthyroid in December 2015, in May 2017, and again in June 2018 on his current dose of Synthroid.  6. Hypogonadotropic hypogonadism: His testosterone level in May 2017 was acceptable, but was lower than in December 2015. His testosterone level in June 2018 was good, but his free testosterone was relatively low for his age.  Depression still seems to be a major factor in his testosterone levels.  7. Hypertension: His SBP and DBP are both more elevated today. He needs to take his lisinopril and exercise regularly.  8. Goiter/Hashimoto's disease: His goiter is smaller, but still mildly enlarged. The pattern of waxing and waning of thyroid gland size is c/w evolving thyroiditis. The thyroiditis is clinically quiescent 9. Depression: This problem seems to be better today. Going back to school has been a major plus for him. He does not exhibit any suicidal thoughts or emotions. He needs to continue to see Dr. [name] and to re-establish care with a psychiatrist. He also needs to take his escitalopram. Since he does not have a psychiatrist or a PCP to prescribe escitalopram, I had agreed to prescribe it for 30 days with 2 refills. That would have allowed him to find a psychiatrist or PCP. He says that he will have new PCP soon. He is not interested in finding another psychiatrist.  10. Noncompliance: He was doing better immediately after starting the new sensor-pump combination, but has not done as well since then. I still continue to hope that as Marshal ages and matures, he will do a better job of taking care of his T1DM and his life. Perhaps the escitalopram  will help, if he takes it.  11. Weight loss, unintentional: This problem has resolved. He is taking more insulin.      12. Hypokalemia: The repeat BMP on 06/04/17 was normal.    PLAN:  1. Diagnostic: I reviewed his lab results from today and hs BG printout.  2. Therapeutic: Continue Synthroid. Resume lisinopril and escitalopram. Give both  correction boluses and food boluses. Continue his current basal rates:  MN: 0.800 5 AM: 0.800 8 AM: 0.650 Noon: 0.900 6 PM: 1.600 9 PM: 1.300 3. Patient education: Zane is a very intelligent young man who knows intellectually what he should do and how to do it. Unfortunately, he continues to lack the commitment to take positive actions about his DM care. If he will start using the Guardian 3 sensor soon and take advantage of all the benefits of the Medtronic 670G pump and sensor combination, then his BG control can be much better. 4. Follow-up: 3 months   Level of Service: This visit lasted in excess of 55 minutes. More than 50% of the visit was devoted to counseling.  [name] Sers, MD, CDE Adult and Pediatric Endocrinology 01/20/2018 10:37 AM

## 2018-04-21 ENCOUNTER — Ambulatory Visit (INDEPENDENT_AMBULATORY_CARE_PROVIDER_SITE_OTHER): Payer: BLUE CROSS/BLUE SHIELD | Admitting: "Endocrinology

## 2018-04-21 ENCOUNTER — Encounter (INDEPENDENT_AMBULATORY_CARE_PROVIDER_SITE_OTHER): Payer: Self-pay | Admitting: "Endocrinology

## 2018-04-21 VITALS — BP 118/70 | HR 90 | Wt 149.4 lb

## 2018-04-21 DIAGNOSIS — E1042 Type 1 diabetes mellitus with diabetic polyneuropathy: Secondary | ICD-10-CM | POA: Diagnosis not present

## 2018-04-21 DIAGNOSIS — F3289 Other specified depressive episodes: Secondary | ICD-10-CM | POA: Diagnosis not present

## 2018-04-21 DIAGNOSIS — IMO0001 Reserved for inherently not codable concepts without codable children: Secondary | ICD-10-CM

## 2018-04-21 DIAGNOSIS — R000 Tachycardia, unspecified: Secondary | ICD-10-CM

## 2018-04-21 DIAGNOSIS — E063 Autoimmune thyroiditis: Secondary | ICD-10-CM | POA: Diagnosis not present

## 2018-04-21 DIAGNOSIS — E230 Hypopituitarism: Secondary | ICD-10-CM | POA: Diagnosis not present

## 2018-04-21 DIAGNOSIS — I10 Essential (primary) hypertension: Secondary | ICD-10-CM | POA: Diagnosis not present

## 2018-04-21 DIAGNOSIS — E10649 Type 1 diabetes mellitus with hypoglycemia without coma: Secondary | ICD-10-CM

## 2018-04-21 DIAGNOSIS — E1065 Type 1 diabetes mellitus with hyperglycemia: Secondary | ICD-10-CM | POA: Diagnosis not present

## 2018-04-21 DIAGNOSIS — E1043 Type 1 diabetes mellitus with diabetic autonomic (poly)neuropathy: Secondary | ICD-10-CM | POA: Diagnosis not present

## 2018-04-21 DIAGNOSIS — E049 Nontoxic goiter, unspecified: Secondary | ICD-10-CM | POA: Diagnosis not present

## 2018-04-21 DIAGNOSIS — I4711 Inappropriate sinus tachycardia, so stated: Secondary | ICD-10-CM

## 2018-04-21 LAB — POCT GLYCOSYLATED HEMOGLOBIN (HGB A1C): HEMOGLOBIN A1C: 8.2

## 2018-04-21 LAB — POCT GLUCOSE (DEVICE FOR HOME USE): POC GLUCOSE: 295 mg/dL — AB (ref 70–99)

## 2018-04-21 MED ORDER — GLUCAGON (RDNA) 1 MG IJ KIT: PACK | INTRAMUSCULAR | 3 refills | Status: DC

## 2018-04-21 MED ORDER — LISINOPRIL 2.5 MG PO TABS: 2.5000 mg | ORAL_TABLET | Freq: Every day | ORAL | 11 refills | Status: DC

## 2018-04-21 MED ORDER — ESCITALOPRAM OXALATE 10 MG PO TABS: 10.0000 mg | ORAL_TABLET | Freq: Every day | ORAL | 4 refills | Status: DC

## 2018-04-21 NOTE — Progress Notes (Signed)
Subjective:  Patient Name: Scott Moran Date of Birth: 12-Oct-1991  MRN: [id number]  Scott Moran  presents to the office today for follow-up of his type 1 diabetes mellitus, goiter, hypoglycemia, depression, social anxiety disorder, avoidant personality disorder, hypothyroidism, thyroiditis, diabetic autonomic neuropathy, inappropriate sinus tachycardia, non-proliferative diabetic retinopathy, diabetic peripheral neuropathy, fatigue, hypogonadotropic hypogonadism, and medical non-compliance.  HISTORY OF PRESENT ILLNESS:   Scott Moran is a 27 y.o. Caucasian young man. Scott Moran was unaccompanied.   1. I first saw the patient in consultation on 03/27/05 in the [facility] in [place], Scott Moran. He had been referred by his primary care provider, Dr. Wendie Moran, in [place], [place], for evaluation and treatment of poorly controlled type 1 diabetes. The patient was 27 years old.  A. The patient had been diagnosed with type 1 diabetes in February of 1995 at age 10. He was on an insulin pump when I first met him. His hemoglobin A1c had increased from 7.1% in December of 2005 to 8.2% in April 2006. He was having frequent snacks that he was not taking boluses for. He was also frequently noncompliant with checking blood sugars and taking meal boluses. I adjusted his basal rates.  B. At his next [place] visit on 06/19/05, he was still very noncompliant. His mental status was normal. He had a 25 gram goiter. His father was serving in [place] with the [employer]. There was a lot of stress at home. I again adjusted his basal rates.  C. At the end of July 2006 we closed our satellite office in [place]. The patient elected to be followed at our clinic here in [place]. I have followed him here ever since.  2. During the past thirteen years, the patient has had several clinical issues.   A. T1DM:    1). The patient's blood glucose control has usually been fairly good for  a teenager/young adult. His maximum hemoglobin A1c occurred on 04/16/06 when the hemoglobin A1c was 12.1%. Since then the hemoglobin A1c values have varied between 6.9% and 8.6%, until his HbA1c on 10/30/17 of 9.0%.    2). He is still often noncompliant with checking blood sugars, bolusing, and changing his insulin pump sites on time, but he is more compliant and more careful than he used to be. He has never required hospital re-admission for DKA, severe hyperglycemia, or hypoglycemia.   3). He started his new Medtronic 670G insulin pump and Guardian 3 sensor in September 2017, but later stopped using the sensor and has refused to resume using it.    B. Goiter, Hashimoto's thyroiditis, and hypothyroidism: In late 2008 and early 2009 the patient developed hypothyroidism secondary to Hashimoto's disease. I started him on Synthroid, 25 mcg per day. When he takes his medication, he is euthyroid.  C. Autonomic neuropathy and inappropriate sinus tachycardia: In late 2008 he developed autonomic neuropathy which manifested as inappropriate sinus tachycardia. These problems have varied in parallel with his BGs and HbA1c values.  D. Diabetic peripheral neuropathy: The patient has had symptoms and signs of mild diabetic peripheral neuropathy at several times in the past. His peripheral neuropathic signs and symptoms have similarly varied in parallel with his BGs and HbA1c values.   E. Non-proliferative diabetic retinopathy: This problem was identified during a diabetic eye exam in June 2010.  F. Hypogonadotropic hypogonadism: The patient has always been somewhat lackadaisical and relatively passive. On 06/29/10 his FSH was 4.9, LH was 2.4, testosterone was 399.66, and free testosterone was 79.8. The  testosterone and free testosterone were relatively low for an 27 year old young man. At the same time the [facility] and LH while "normal", appeared to be actually inappropriately low for the level of testosterone he had. We  discussed the options for treatment including exercise and medication, to include AndroGel. The patient chose the exercise option. By 10/19/10 the [facility] was 6.8, the LH was 3.2, total testosterone was 615.88 and the free testosterone was 131.6. In retrospect, when he was more depressed he may have had less gonadotropic function.   G. Depression/Anxiety/Personality disorders:    1). The patient has been diagnosed with depression at different times in the past. For some time he took Remeron (mirtazapine), but discontinued that medication in early 2012. I had been trying to convince him for some time that he was depressed and that he should seek out help. He eventually did decide to seek psychiatric help.    2). On 06/08/13 Bryon was evaluated by [name], a noted psychiatrist in [place].  [name] and his staff performed neuropsychiatric testing. On the NICHLOS KUNZLER had problems with non-verbal reasoning and emotional intelligence. 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 [name] prescribed escitalopram, 10 mg/day. Elishua was supposed to continue to see [name] in follow up. Unfortunately, [name] did not follow up.    3). Aleczander was then referred to a therapist, [name]. Clennon enjoys working with Dr. [name] and feels that he has made progress over time. Unfortunately, Carolyn has not been back to see Dr. [name] for almost one year.   H. 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   3. The patient's last [facility] visit was on 01/20/18. At that visit I continued his Medtronic 670G insulin pump settings and his Synthroid, escitalopram, and lisinopril. In the interim he has been healthy, except for "a few stomach bugs".   A. He continues his on-site courses at [employer] and is very busy. Scott Moran is  very enthusiastic about taking courses at [employer].   B. He is still not using his Guardian 3 sensor, but isn't sure why he is not using it. He says maybe he just doesn't have the desire to use it.  Rebeca Alert has not seen Dr. [name], his therapist, due to conflicts with his academic schedule.    Juanda Bond takes his Synthroid dose of 25 mcg/day every night. He is not taking lisinopril and escitalopram.   E. He has his learner's permit, but has not wanted to try driving. He is severely afraid of other drivers that are terrible.   4. Pertinent Review of Systems:  Constitutional: Mindy feels "pretty good overall". He feels that he is not depressed. His life is going well. His allergies are not acting up much.  Eyes: Vision is good with his glasses. There are no significant eye complaints. Last eye exam was in November 2017. There were no signs of diabetic retinopathy. He needs a follow up exam. Mouth: No problems.  Neck: The patient has no complaints of anterior neck swelling, soreness, tenderness,  pressure, discomfort, or difficulty swallowing.  Heart: Heart rate increases with exercise or other physical activity. The patient has no complaints of palpitations, irregular heat beats, chest pain, or chest pressure. Gastrointestinal: No postprandial bloating. Bowel movents seem normal. The patient has no other complaints of excessive hunger, acid reflux, upset  stomach, stomach aches or pains, diarrhea, or constipation. Legs: Muscle mass and strength seem normal. There are no complaints of numbness, tingling, burning, or pain. No edema is noted. Feet: There are no obvious foot problems. There are no complaints of numbness, tingling, burning, or pain. No edema is noted. Hypoglycemia: He has had "a few" low BGs recently. None were severe.   Emotional/psychological: The patient feels that he is "doing fine".   Diabetes identification: He has his pump on and a wallet card.  .  5. BG printout: He is changing  his sites every 8 days. He is checking BGs 1-4 times per day, for an average of 2.5 times per day. He boluses 1-4 times per day, but some of the boluses are only food boluses. Average BG is 182, compared with 246 at his last visit and with 274 at his prior visit. BG range is 60-372, compared with 70 to >400 at his last visit and with 58 to >400 at his prior visit. The  longer he leaves sites in and the longer he goes between BG checks and insulin boluses, the more variable his BGs have been. He had 7 BG <80 in the past 2 weeks, to include a 54, 60, 60, 65, 68, 70, and 76. He had 0 BGs >400.     6. Sensor printout: None   PAST MEDICAL, FAMILY, AND SOCIAL HISTORY:  Past Medical History:  Diagnosis Date  . DM type 1 with diabetic peripheral neuropathy ([place])   . Goiter   . Hypertension   . Hypoglycemia associated with diabetes ([place])   . Hypogonadotropic hypogonadism ([place])   . Hypothyroidism, acquired, autoimmune   . Tachycardia   . Thyroiditis, autoimmune   . Type 1 diabetes mellitus not at goal [facility])   . Type 1 diabetes mellitus with diabetic autonomic neuropathy ([facility])     Family History  Problem Relation Age of Onset  . Diabetes Maternal Uncle   . Hypertension Maternal Grandmother   . Thyroid disease Neg Hx   . Obesity Neg Hx   . Kidney disease Neg Hx   . Cancer Neg Hx   . Anemia Neg Hx      Current Outpatient Medications:  .  acetone, urine, test strip, Check ketones per protocol, Disp: 50 each, Rfl: 3 .  glucose blood (BAYER CONTOUR NEXT TEST) test strip, Check sugar 10 x daily, Disp: 300 each, Rfl: 3 .  insulin aspart (NOVOLOG) 100 UNIT/ML injection, Use up to 300 units in insulin pump every 48 hours., Disp: 4 vial, Rfl: 5 .  levothyroxine (SYNTHROID, LEVOTHROID) 25 MCG tablet, TAKE 1 TABLET BY MOUTH DAILY BEFORE BREAKFAST, Disp: 30 tablet, Rfl: 5 .  escitalopram (LEXAPRO) 10 MG tablet, TAKE 1 TABLET BY MOUTH ONCE DAILY (Patient not taking: Reported on 08/13/2017), Disp: 30  tablet, Rfl: 4 .  glucagon (GLUCAGON EMERGENCY) 1 MG injection, Inject 1 mg intramuscularly in thigh 1 time for severe hypoglycemia if unresponsive, unconscious, unable to swallow or has a seizure., Disp: 2 kit, Rfl: 3 .  ondansetron (ZOFRAN ODT) 4 MG disintegrating tablet, Take 1 tablet (4 mg total) by mouth every 8 (eight) hours as needed for nausea or vomiting. (Patient not taking: Reported on 10/30/2016), Disp: 20 tablet, Rfl: 0 .  promethazine (PHENERGAN) 25 MG tablet, Take 1 tablet (25 mg total) by mouth every 6 (six) hours as needed for nausea or vomiting. (Patient not taking: Reported on 10/30/2016), Disp: 20 tablet, Rfl: 0  Allergies as of 04/21/2018  . (  No Known Allergies)    1. Work and Family: Patient is taking on-site courses at [employer].  He is doing well. His grades are good.  2. Activities: Few physical activities. He is a history and politics buff. He frequently participates in online forum discussions about historical and political topics. 3. Smoking, alcohol, or drugs: None 4. Primary Care Provider: He does not have a primary care provider yet.   5. Psychiatry: He no longer has a psychiatrist.  6. Psychologist: [name]  REVIEW OF SYSTEMS: There are no other significant problems involving Nazareth's other body systems.   Objective:  Vital Signs:  BP 118/70   Pulse 90   Wt 149 lb 6.4 oz (67.8 kg)   BMI 23.09 kg/m     Ht Readings from Last 3 Encounters:  01/20/18 5' 7.44" (1.713 m)  01/31/17 5' 6.93" (1.7 m)  09/15/15 5' 9" (1.753 m)   Wt Readings from Last 3 Encounters:  04/21/18 149 lb 6.4 oz (67.8 kg)  01/20/18 150 lb 6.4 oz (68.2 kg)  10/30/17 145 lb 9.6 oz (66 kg)   PHYSICAL EXAM: Constitutional: Benno was alert and cognitively normal today. His affect was very upbeat today. He was normally talkative, but not excessively so. His ability to control his own thoughts and actions is better today, but he continues to be stressed by all of the world's  problems. His weight has decreased 1 pound since last visit. He has been more consistent in his DM care again this past month.  Face: The face appears normal.  Eyes: There is no obvious arcus or proptosis. Moisture appears normal. Mouth: The oropharynx and tongue appear normal. Oral moisture is normal. Neck: The neck looks normal. No carotid bruits are noted. The thyroid gland is smaller at about 20 grams in size today. Both lobes are normal in size today. The consistency of the thyroid gland is normal today. The thyroid gland is not tender to palpation.  Lungs: The lungs are clear to auscultation. Air movement is good. Heart: Heart rate remains tachycardic. Heart rhythm is regular. Heart sounds S1 and S2 are normal. I did not appreciate any pathologic cardiac murmurs. Abdomen: The abdomen is fairly normal in size. Bowel sounds are normal. There is no obvious hepatomegaly, splenomegaly, or other mass effect.  Arms: Muscle size and bulk are normal for age. Hands: There is no obvious tremor. Phalangeal and metacarpophalangeal joints are normal. Palmar muscles are normal. Palmar skin is normal. Palmar moisture is also normal. Legs: Muscles appear normal for age. No edema is present. Feet: Feet are normally formed. Dorsalis pedal pulses are 1+ bilaterally.   Neurologic: Strength is normal for age in both the upper and lower extremities. Muscle tone is normal. Sensation to touch is normal in both legs and in both feet.     LAB DATA:   Results for orders placed or performed in visit on 04/21/18 (from the past 504 hour(s))  POCT Glucose (Device for Home Use)   Collection Time: 04/21/18 10:31 AM  Result Value Ref Range   Glucose Fasting, POC  70 - 99 mg/dL   POC Glucose 295 (A) 70 - 99 mg/dl   Labs 04/21/18: HbA1c 8.2%, CBG 295  Labs 01/20/18: HbA1c 8.4%, CBG 206  Labs 10/30/17: HbA1c 9.0%, CBG 147  Labs 08/13/17: CBG 354  Labs 06/04/17: CBG 173; BMP normal  Labs 05/29/17: HbA1c 8.2%; TSH 1.84,  free T4 1.2, free T3 3.4, CMP normal except for glucose 310 and potassium 5.8;  LH 3.7, FSH 5.9, testosterone 634, free testosterone 76 (ref 46-224), estradiol 31; microalbumin/creatinine ratio 4; cholesterol 187, triglycerides 63, HDL 59, LDL 116  Labs 04/03/17: CBG 182  Labs 01/31/17: HbA1c 7.6%, CBG 341  Labs 10/30/16: HbA1c 7.5%  Labs 07/25/16: HbA1c 7.8%  Labs 04/25/16: HbA1c 7.7%  Labs 04/09/16: LH 3.4, FSH 6.0, testosterone 504, free testosterone 85.5 (ref 47-244); cholesterol 176, triglycerides 92, HDL 80, LDL 88; CMP norma except for glucose 224; TSH 1.75, free T4 1.2, free T3 3.2; microalbumin/creatinine ratio 4  Labs 01/24/16: HbA1c 8.6%  Labs 10/20/15: HbA1c 8.1%  Labs 09/15/15: BMP normal except for glucose of 143 and calcium 8.3.  Labs 07/20/15: HbA1c 8.2%  Labs 03/25/15: HbA1c 7.8%  Labs 12/02/14: HbA1c 8.5%;  LH 4.0, FSH 8.2, testosterone 621.97; CMP normal; TSH 2.01, free T4 1.21, free T3 3.6; cholesterol 155, triglycerides 46, HDL  55, LDL 91; microalbumin/creatinine ratio 6.6  Labs 03/30/14: TSH 1.935, free T4 1.12, free T3 3.2; CMP: normal except for glucose 203; testosterone 624, FSH 6.3, LH 2.4; urine microalbumin/creatinine ratio 6.4; cholesterol 163, triglycerides 55, HDL 64, LDL 80  Labs 08/23/13: TSH 1.139; CMP normal except for a glucose of 145; CBC: WBC 5.4, Hgb 12.7, Hct 34.7%   Assessment and Plan:   ASSESSMENT:  1-2. Type 1 diabetes mellitus/hypoglycemia:   A. Zoey's HbA1c is a bit lower today, but his average BG is much lower. This discrepancy suggests that his BGs were much higher 2-3 months ago.   B. His is doing a much better job of checking BGs, is sometimes better at bolusing, but is not better at changing sites. He has not used his sensors and is not interested in doing so.  C. He had 7 mild hypoglycemic BGs this month. Some to these low BGs occurred after he had done food blouses without having checked his BGs first.  3. Autonomic neuropathy and  inappropriate sinus tachycardia: These problems are a little better, paralleling his decrease in HbA1c.    4. Peripheral neuropathy: This problem is not evident present.    5. Hypothyroid: He was euthyroid in December 2015, in May 2017, and again in June 2018 on his current dose of Synthroid.  6. Hypogonadotropic hypogonadism: His testosterone level in May 2017 was acceptable, but was lower than in December 2015. His testosterone level in June 2018 was good, but his free testosterone was relatively low for his age.  Depression still seems to be a major factor in his testosterone levels.  7. Hypertension: His SBP and DBP are normal. He needs to take his lisinopril and exercise regularly.  8. Goiter/Hashimoto's disease: His goiter is smaller, now within the normal range. The pattern of waxing and waning of thyroid gland size is c/w evolving thyroiditis. The thyroiditis is clinically quiescent 9. Depression: This problem seems to be better today. Going back to school has been a major plus for him. He does not exhibit any suicidal thoughts or emotions. He needs to continue to see Dr. [name] and to re-establish care with a psychiatrist. He also needs to take his escitalopram. Since he does not have a psychiatrist or a PCP to prescribe escitalopram, I had agreed to prescribe it for 30 days with 2 refills. That would have allowed him to find a psychiatrist or PCP. Unfortunately, he is not interested in finding another psychiatrist.  10. Noncompliance: He was doing better immediately after starting the new sensor-pump combination, but has not done as well since discontinuing the sensor. I continue  to hope that as Treshon ages and matures, he will do a better job of taking care of his T1DM and his life. Perhaps the escitalopram will help, if he takes it.       PLAN:  1. Diagnostic: I reviewed his lab results from today and hs BG printout. Annual surveillance labs prior to next visit.  2. Therapeutic: Continue  Synthroid, 25 mcg/day. Resume lisinopril 2.5 mg/day and escitalopram, 10 mg/day. Give both correction boluses and food boluses. Continue his current basal rates:  MN: 0.800 5 AM: 0.800 8 AM: 0.650 Noon: 0.900 6 PM: 1.600 9 PM: 1.300 3. Patient education: Jakie is a very intelligent young man who knows intellectually what he should do and how to do it. Unfortunately, he continues to lack the commitment to be as consistent with his T1DM care as he needs to be. If he will start using the Guardian 3 sensor and take advantage of all the benefits of the Medtronic 670G pump and sensor combination, then his BG control can be much better. 4. Follow-up: 3 months with Mr. Leafy Ro and then in 6 months with me  Level of Service: This visit lasted in excess of 50 minutes. More than 50% of the visit was devoted to counseling.  [name] Sers, MD, CDE Adult and Pediatric Endocrinology 04/21/2018 10:35 AM

## 2018-04-21 NOTE — Patient Instructions (Addendum)
Follow up appointment in 3-4 months with Mr. Dalbert Garnet. Please repeat fasting lab tests 1-2 weeks prior.

## 2018-04-29 ENCOUNTER — Other Ambulatory Visit (INDEPENDENT_AMBULATORY_CARE_PROVIDER_SITE_OTHER): Payer: Self-pay | Admitting: "Endocrinology

## 2018-04-29 DIAGNOSIS — IMO0001 Reserved for inherently not codable concepts without codable children: Secondary | ICD-10-CM

## 2018-04-29 DIAGNOSIS — E1065 Type 1 diabetes mellitus with hyperglycemia: Principal | ICD-10-CM

## 2018-05-15 ENCOUNTER — Other Ambulatory Visit (INDEPENDENT_AMBULATORY_CARE_PROVIDER_SITE_OTHER): Payer: Self-pay | Admitting: *Deleted

## 2018-05-15 DIAGNOSIS — E1065 Type 1 diabetes mellitus with hyperglycemia: Principal | ICD-10-CM

## 2018-05-15 DIAGNOSIS — IMO0001 Reserved for inherently not codable concepts without codable children: Secondary | ICD-10-CM

## 2018-05-15 MED ORDER — INSULIN LISPRO 100 UNIT/ML ~~LOC~~ SOLN: SUBCUTANEOUS | 5 refills | Status: DC

## 2018-06-16 ENCOUNTER — Other Ambulatory Visit (INDEPENDENT_AMBULATORY_CARE_PROVIDER_SITE_OTHER): Payer: Self-pay | Admitting: "Endocrinology

## 2018-06-16 DIAGNOSIS — E038 Other specified hypothyroidism: Secondary | ICD-10-CM

## 2018-07-15 DIAGNOSIS — E1065 Type 1 diabetes mellitus with hyperglycemia: Secondary | ICD-10-CM | POA: Diagnosis not present

## 2018-07-15 DIAGNOSIS — E049 Nontoxic goiter, unspecified: Secondary | ICD-10-CM | POA: Diagnosis not present

## 2018-07-16 LAB — COMPREHENSIVE METABOLIC PANEL
AG RATIO: 1.5 (calc) (ref 1.0–2.5)
ALBUMIN MSPROF: 4.4 g/dL (ref 3.6–5.1)
ALKALINE PHOSPHATASE (APISO): 74 U/L (ref 40–115)
ALT: 12 U/L (ref 9–46)
AST: 12 U/L (ref 10–40)
BILIRUBIN TOTAL: 0.7 mg/dL (ref 0.2–1.2)
BUN: 9 mg/dL (ref 7–25)
CHLORIDE: 101 mmol/L (ref 98–110)
CO2: 29 mmol/L (ref 20–32)
CREATININE: 0.84 mg/dL (ref 0.60–1.35)
Calcium: 9.7 mg/dL (ref 8.6–10.3)
GLOBULIN: 3 g/dL (ref 1.9–3.7)
Glucose, Bld: 114 mg/dL — ABNORMAL HIGH (ref 65–99)
POTASSIUM: 4.4 mmol/L (ref 3.5–5.3)
Sodium: 140 mmol/L (ref 135–146)
Total Protein: 7.4 g/dL (ref 6.1–8.1)

## 2018-07-16 LAB — LIPID PANEL
CHOL/HDL RATIO: 3.3 (calc) (ref <5.0)
CHOLESTEROL: 176 mg/dL (ref <200)
HDL: 53 mg/dL (ref >40)
LDL Cholesterol (Calc): 107 mg/dL (calc) — ABNORMAL HIGH
Non-HDL Cholesterol (Calc): 123 mg/dL (calc) (ref <130)
Triglycerides: 74 mg/dL (ref <150)

## 2018-07-16 LAB — MICROALBUMIN / CREATININE URINE RATIO
Creatinine, Urine: 383 mg/dL — ABNORMAL HIGH (ref 20–320)
MICROALB UR: 2.2 mg/dL
MICROALB/CREAT RATIO: 6 ug/mg{creat} (ref <30)

## 2018-07-16 LAB — T4, FREE: Free T4: 1.2 ng/dL (ref 0.8–1.8)

## 2018-07-16 LAB — TSH: TSH: 1.54 m[IU]/L (ref 0.40–4.50)

## 2018-07-16 LAB — T3, FREE: T3 FREE: 3.5 pg/mL (ref 2.3–4.2)

## 2018-07-21 ENCOUNTER — Encounter (INDEPENDENT_AMBULATORY_CARE_PROVIDER_SITE_OTHER): Payer: Self-pay | Admitting: "Endocrinology

## 2018-07-21 ENCOUNTER — Ambulatory Visit (INDEPENDENT_AMBULATORY_CARE_PROVIDER_SITE_OTHER): Payer: BLUE CROSS/BLUE SHIELD | Admitting: Family

## 2018-07-21 ENCOUNTER — Ambulatory Visit (INDEPENDENT_AMBULATORY_CARE_PROVIDER_SITE_OTHER): Payer: BLUE CROSS/BLUE SHIELD | Admitting: "Endocrinology

## 2018-07-21 VITALS — BP 124/80 | HR 90 | Ht 67.52 in | Wt 147.0 lb

## 2018-07-21 DIAGNOSIS — E10649 Type 1 diabetes mellitus with hypoglycemia without coma: Secondary | ICD-10-CM

## 2018-07-21 DIAGNOSIS — E1065 Type 1 diabetes mellitus with hyperglycemia: Secondary | ICD-10-CM | POA: Diagnosis not present

## 2018-07-21 DIAGNOSIS — I10 Essential (primary) hypertension: Secondary | ICD-10-CM

## 2018-07-21 DIAGNOSIS — F329 Major depressive disorder, single episode, unspecified: Secondary | ICD-10-CM

## 2018-07-21 DIAGNOSIS — IMO0001 Reserved for inherently not codable concepts without codable children: Secondary | ICD-10-CM

## 2018-07-21 DIAGNOSIS — F32A Depression, unspecified: Secondary | ICD-10-CM

## 2018-07-21 DIAGNOSIS — E1042 Type 1 diabetes mellitus with diabetic polyneuropathy: Secondary | ICD-10-CM

## 2018-07-21 DIAGNOSIS — E1043 Type 1 diabetes mellitus with diabetic autonomic (poly)neuropathy: Secondary | ICD-10-CM | POA: Diagnosis not present

## 2018-07-21 DIAGNOSIS — E049 Nontoxic goiter, unspecified: Secondary | ICD-10-CM

## 2018-07-21 DIAGNOSIS — Z9119 Patient's noncompliance with other medical treatment and regimen: Secondary | ICD-10-CM

## 2018-07-21 DIAGNOSIS — E063 Autoimmune thyroiditis: Secondary | ICD-10-CM

## 2018-07-21 DIAGNOSIS — Z91199 Patient's noncompliance with other medical treatment and regimen due to unspecified reason: Secondary | ICD-10-CM

## 2018-07-21 LAB — POCT GLYCOSYLATED HEMOGLOBIN (HGB A1C): HEMOGLOBIN A1C: 8.2 % — AB (ref 4.0–5.6)

## 2018-07-21 LAB — POCT GLUCOSE (DEVICE FOR HOME USE): POC Glucose: 166 mg/dl — AB (ref 70–99)

## 2018-07-21 NOTE — Patient Instructions (Signed)
Follow up visit in 3 months. 

## 2018-07-21 NOTE — Progress Notes (Signed)
Subjective:  Patient Name: Scott Moran Date of Birth: 12-Oct-1991  MRN: [id number]  Scott Moran  presents to the office today for follow-up of his type 1 diabetes mellitus, goiter, hypoglycemia, depression, social anxiety disorder, avoidant personality disorder, hypothyroidism, thyroiditis, diabetic autonomic neuropathy, inappropriate sinus tachycardia, non-proliferative diabetic retinopathy, diabetic peripheral neuropathy, fatigue, hypogonadotropic hypogonadism, and medical non-compliance.  HISTORY OF PRESENT ILLNESS:   Scott Moran is a 27 y.o. Caucasian young man. Scott Moran was unaccompanied.   1. I first saw the patient in consultation on 03/27/05 in the [facility] in [place], [place]. He had been referred by his primary care provider, [name], in [place], [place], for evaluation and treatment of poorly controlled type 1 diabetes. The patient was 27 years old.  A. The patient had been diagnosed with type 1 diabetes in February of 1995 at age 10. He was on an insulin pump when I first met him. His hemoglobin A1c had increased from 7.1% in December of 2005 to 8.2% in April 2006. He was having frequent snacks that he was not taking boluses for. He was also frequently noncompliant with checking blood sugars and taking meal boluses. I adjusted his basal rates.  B. At his next [place] visit on 06/19/05, he was still very noncompliant. His mental status was normal. He had a 25 gram goiter. His father was serving in [place] with the [employer]. There was a lot of stress at home. I again adjusted his basal rates.  C. At the end of July 2006 we closed our satellite office in [place]. The patient elected to be followed at our clinic here in [place]. I have followed him here ever since.  2. During the past thirteen years, the patient has had several clinical issues.   A. T1DM:    1). The patient's blood glucose control has usually been fairly good for  a teenager/young adult. His maximum hemoglobin A1c occurred on 04/16/06 when the hemoglobin A1c was 12.1%. Since then the hemoglobin A1c values have varied between 6.9% and 8.6%, until his HbA1c on 10/30/17 of 9.0%.    2). He is still often noncompliant with checking blood sugars, bolusing, and changing his insulin pump sites on time, but he is more compliant and more careful than he used to be. He has never required hospital re-admission for DKA, severe hyperglycemia, or hypoglycemia.   3). He started his new Medtronic 670G insulin pump and Guardian 3 sensor in September 2017, but later stopped using the sensor and has refused to resume using it.    B. Goiter, Hashimoto's thyroiditis, and hypothyroidism: In late 2008 and early 2009 the patient developed hypothyroidism secondary to Hashimoto's disease. I started him on Synthroid, 25 mcg per day. When he takes his medication, he is euthyroid.  C. Autonomic neuropathy and inappropriate sinus tachycardia: In late 2008 he developed autonomic neuropathy which manifested as inappropriate sinus tachycardia. These problems have varied in parallel with his BGs and HbA1c values.  D. Diabetic peripheral neuropathy: The patient has had symptoms and signs of mild diabetic peripheral neuropathy at several times in the past. His peripheral neuropathic signs and symptoms have similarly varied in parallel with his BGs and HbA1c values.   E. Non-proliferative diabetic retinopathy: This problem was identified during a diabetic eye exam in June 2010.  F. Hypogonadotropic hypogonadism: The patient has always been somewhat lackadaisical and relatively passive. On 06/29/10 his FSH was 4.9, LH was 2.4, testosterone was 399.66, and free testosterone was 79.8. The  testosterone and free testosterone were relatively low for an 27 year old young man. At the same time the [facility] and LH while "normal", appeared to be actually inappropriately low for the level of testosterone he had. We  discussed the options for treatment including exercise and medication, to include AndroGel. The patient chose the exercise option. By 10/19/10 the [facility] was 6.8, the LH was 3.2, total testosterone was 615.88 and the free testosterone was 131.6. In retrospect, when he was more depressed he may have had less gonadotropic function.   G. Depression/Anxiety/Personality disorders:    1). The patient has been diagnosed with depression at different times in the past. For some time he took Remeron (mirtazapine), but discontinued that medication in early 2012. I had been trying to convince him for some time that he was depressed and that he should seek out help. He eventually did decide to seek psychiatric help.    2). On 06/08/13 Scott Moran was evaluated by [name], a noted psychiatrist in [place].  [name] and his staff performed neuropsychiatric testing. On the LUZ BURCHER had problems with non-verbal reasoning and emotional intelligence. 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 [name] prescribed escitalopram, 10 mg/day. Scott Moran was supposed to continue to see [name] in follow up. Unfortunately, Scott Moran did not follow up.    3). Jayen was then referred to a therapist, [name]. Wm enjoyed working with Dr. [name] in the past and felt that he had made progress over time. Unfortunately, Amel has not been back to see Dr. [name] for more than one year.   H. 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   3. The patient's last [facility] visit was on 04/21/18. At that visit I continued his Medtronic 670G insulin pump settings and his Synthroid, escitalopram, and lisinopril. In the interim he has been healthy.   A. He will take one on-site course at [employer] and 3 on-line courses through [employer]. Scott Moran is  still interested in taking courses at [employer].   B. He is still not using his Guardian 3 sensor, but isn't sure why he is not using it. He says maybe he just doesn't have the desire to use it.  Scott Moran has not seen Dr. [name], his therapist, due to conflicts with his academic schedule.   Scott Moran takes his Synthroid dose of 25 mcg/day every night. He is taking his escitalopram each night, but is not taking lisinopril.   E. He has his learner's permit, but has not wanted to try driving. He is severely afraid of other drivers that are terrible.   4. Pertinent Review of Systems:  Constitutional: Scott Moran feels "more tired, but overall okay". He feels that he is not as depressed as he was, but when pressed, admits that he is still depressed. His life is going "pretty good" overall. His allergies are not acting up much.  Eyes: Vision is good with his glasses. There are no significant eye complaints. Last eye exam was in November 2017. There were no signs of diabetic retinopathy. He needs a follow up exam. He is still waiting for his mother to schedule an eye exam for him. Mouth: No problems.  Neck: The patient has no complaints of anterior neck swelling, soreness, tenderness,  pressure, discomfort, or difficulty swallowing.  Heart: Heart rate increases with exercise or other physical  activity. The patient has no complaints of palpitations, irregular heat beats, chest pain, or chest pressure. Gastrointestinal: No postprandial bloating. Bowel movents seem normal. The patient has no other complaints of excessive hunger, acid reflux, upset stomach, stomach aches or pains, diarrhea, or constipation. Legs: Muscle mass and strength seem normal. There are no complaints of numbness, tingling, burning, or pain. No edema is noted. Feet: There are no obvious foot problems. There are no complaints of numbness, tingling, burning, or pain. No edema is noted. Hypoglycemia: He has had "some" low BGs recently in the 60s-70s  when he sleeps in late. None were severe.   Emotional/psychological: The patient feels that he is "pretty good overall".   Diabetes identification: He has his pump on and a wallet card.  .  5. BG printout: He is changing his sites every 7 days. He is checking BGs 0-4 times per day, for an average of 2.5 times per day. He boluses 1-9 times per day, but many of the boluses are only food boluses. Average BG is 217, compared with 182 at his last visit and with 246 at his prior visit. BG range is 55 - >400, compared with 60-372 at his last visit and with 70 to >400 at his prior visit. The  longer he leaves sites in and the longer he goes between BG checks and insulin boluses, the more variable his BGs have been. He had 6 BG <80 in the past 2 weeks, 4 of which occurred when he slept in late.  He had 3 BGs >400 when there was a problem with insulin delivery.      PAST MEDICAL, FAMILY, AND SOCIAL HISTORY:  Past Medical History:  Diagnosis Date  . DM type 1 with diabetic peripheral neuropathy ([place])   . Goiter   . Hypertension   . Hypoglycemia associated with diabetes ([place])   . Hypogonadotropic hypogonadism ([place])   . Hypothyroidism, acquired, autoimmune   . Tachycardia   . Thyroiditis, autoimmune   . Type 1 diabetes mellitus not at goal [facility])   . Type 1 diabetes mellitus with diabetic autonomic neuropathy ([facility])     Family History  Problem Relation Age of Onset  . Diabetes Maternal Uncle   . Hypertension Maternal Grandmother   . Thyroid disease Neg Hx   . Obesity Neg Hx   . Kidney disease Neg Hx   . Cancer Neg Hx   . Anemia Neg Hx      Current Outpatient Medications:  .  acetone, urine, test strip, Check ketones per protocol, Disp: 50 each, Rfl: 3 .  escitalopram (LEXAPRO) 10 MG tablet, Take 1 tablet (10 mg total) by mouth daily., Disp: 30 tablet, Rfl: 4 .  glucagon (GLUCAGON EMERGENCY) 1 MG injection, Inject 1 mg intramuscularly in thigh 1 time for severe hypoglycemia if unresponsive,  unconscious, unable to swallow or has a seizure., Disp: 2 kit, Rfl: 3 .  glucose blood (BAYER CONTOUR NEXT TEST) test strip, Check sugar 10 x daily, Disp: 300 each, Rfl: 3 .  insulin lispro (HUMALOG) 100 UNIT/ML injection, Up to 300 units of insulin in insulin pump every 48-72 hours pre DKA and Hyperglycemia protocols, Disp: 40 mL, Rfl: 5 .  levothyroxine (SYNTHROID, LEVOTHROID) 25 MCG tablet, TAKE 1 TABLET BY MOUTH DAILY BEFORE BREAKFAST, Disp: 30 tablet, Rfl: 5 .  insulin aspart (NOVOLOG) 100 UNIT/ML injection, INJECT 300 UNITS INTO PUMP EVERY 48-72 HOURS AND PER PROTOCOL FOR HYPERGLYCEMIA, Disp: 50 mL, Rfl: 5 .  lisinopril (PRINIVIL,ZESTRIL) 2.5 MG  tablet, Take 1 tablet (2.5 mg total) by mouth daily. (Patient not taking: Reported on 07/21/2018), Disp: 30 tablet, Rfl: 11 .  ondansetron (ZOFRAN ODT) 4 MG disintegrating tablet, Take 1 tablet (4 mg total) by mouth every 8 (eight) hours as needed for nausea or vomiting. (Patient not taking: Reported on 10/30/2016), Disp: 20 tablet, Rfl: 0 .  promethazine (PHENERGAN) 25 MG tablet, Take 1 tablet (25 mg total) by mouth every 6 (six) hours as needed for nausea or vomiting. (Patient not taking: Reported on 10/30/2016), Disp: 20 tablet, Rfl: 0  Allergies as of 07/21/2018  . (No Known Allergies)    1. Work and Family: Patient is taking on-site and on-line courses at [employer].  He is doing well. His grades are good.  2. Activities: Few physical activities. He is a history and politics buff. He frequently participates in online forum discussions about historical and political topics. 3. Smoking, alcohol, or drugs: None 4. Primary Care Provider: He does not have a current primary care provider. He used to see [name]. 5. Psychiatry: He no longer has a psychiatrist.  6. Psychologist: [name]  REVIEW OF SYSTEMS: There are no other significant problems involving Scott Moran's other body systems.   Objective:  Vital Signs:  BP 124/80   Pulse 90   Ht  5' 7.52" (1.715 m)   Wt 147 lb (66.7 kg)   BMI 22.67 kg/m     Ht Readings from Last 3 Encounters:  07/21/18 5' 7.52" (1.715 m)  01/20/18 5' 7.44" (1.713 m)  01/31/17 5' 6.93" (1.7 m)   Wt Readings from Last 3 Encounters:  07/21/18 147 lb (66.7 kg)  04/21/18 149 lb 6.4 oz (67.8 kg)  01/20/18 150 lb 6.4 oz (68.2 kg)   PHYSICAL EXAM: Constitutional: Scott Moran was Moran and cognitively normal today. His affect was fairly flat today. He was not as talkative as usual. His insight was fairly good today. His weight has decreased 2 pound since last visit. He has been less consistent in his DM care since his last visit.  Face: The face appears normal.  Eyes: There is no obvious arcus or proptosis. Moisture appears normal. Mouth: The oropharynx and tongue appear normal. Oral moisture is normal. Neck: The neck looks normal. No carotid bruits are noted. The thyroid gland is again at about 20 grams in size today. Both lobes are normal in size today. The consistency of the thyroid gland is normal today. The thyroid gland is not tender to palpation.  Lungs: The lungs are clear to auscultation. Air movement is good. Heart: Heart rate remains tachycardic. Heart rhythm is regular. Heart sounds S1 and S2 are normal. I did not appreciate any pathologic cardiac murmurs. Abdomen: The abdomen is fairly normal in size. Bowel sounds are normal. There is no obvious hepatomegaly, splenomegaly, or other mass effect.  Arms: Muscle size and bulk are normal for age. Hands: There is no obvious tremor. Phalangeal and metacarpophalangeal joints are normal. Palmar muscles are normal. Palmar skin is normal. Palmar moisture is also normal. Legs: Muscles appear normal for age. No edema is present. Feet: Feet are normally formed. Dorsalis pedal pulses are 1+ bilaterally.   Neurologic: Strength is normal for age in both the upper and lower extremities. Muscle tone is normal. Sensation to touch is normal in both legs, but decreased  in both heels.      LAB DATA:   Results for orders placed or performed in visit on 07/21/18 (from the past 504 hour(s))  POCT Glucose (Device for Home Use)   Collection Time: 07/21/18 11:28 AM  Result Value Ref Range   Glucose Fasting, POC     POC Glucose 166 (A) 70 - 99 mg/dl  POCT glycosylated hemoglobin (Hb A1C)   Collection Time: 07/21/18 11:38 AM  Result Value Ref Range   Hemoglobin A1C 8.2 (A) 4.0 - 5.6 %   HbA1c POC (<> result, manual entry)     HbA1c, POC (prediabetic range)     HbA1c, POC (controlled diabetic range)    Results for orders placed or performed in visit on 04/21/18 (from the past 504 hour(s))  Comprehensive metabolic panel   Collection Time: 07/15/18 12:30 PM  Result Value Ref Range   Glucose, Bld 114 (H) 65 - 99 mg/dL   BUN 9 7 - 25 mg/dL   Creat 0.84 0.60 - 1.35 mg/dL   BUN/Creatinine Ratio NOT APPLICABLE 6 - 22 (calc)   Sodium 140 135 - 146 mmol/L   Potassium 4.4 3.5 - 5.3 mmol/L   Chloride 101 98 - 110 mmol/L   CO2 29 20 - 32 mmol/L   Calcium 9.7 8.6 - 10.3 mg/dL   Total Protein 7.4 6.1 - 8.1 g/dL   Albumin 4.4 3.6 - 5.1 g/dL   Globulin 3.0 1.9 - 3.7 g/dL (calc)   AG Ratio 1.5 1.0 - 2.5 (calc)   Total Bilirubin 0.7 0.2 - 1.2 mg/dL   Alkaline phosphatase (APISO) 74 40 - 115 U/L   AST 12 10 - 40 U/L   ALT 12 9 - 46 U/L  Lipid panel   Collection Time: 07/15/18 12:30 PM  Result Value Ref Range   Cholesterol 176 <200 mg/dL   HDL 53 >40 mg/dL   Triglycerides 74 <150 mg/dL   LDL Cholesterol (Calc) 107 (H) mg/dL (calc)   Total CHOL/HDL Ratio 3.3 <5.0 (calc)   Non-HDL Cholesterol (Calc) 123 <130 mg/dL (calc)  Microalbumin / creatinine urine ratio   Collection Time: 07/15/18 12:30 PM  Result Value Ref Range   Creatinine, Urine 383 (H) 20 - 320 mg/dL   Microalb, Ur 2.2 mg/dL   Microalb Creat Ratio 6 <30 mcg/mg creat  T3, free   Collection Time: 07/15/18 12:30 PM  Result Value Ref Range   T3, Free 3.5 2.3 - 4.2 pg/mL  T4, free   Collection Time:  07/15/18 12:30 PM  Result Value Ref Range   Free T4 1.2 0.8 - 1.8 ng/dL  TSH   Collection Time: 07/15/18 12:30 PM  Result Value Ref Range   TSH 1.54 0.40 - 4.50 mIU/L   Labs 07/21/18: HbA1c 8.2%, CBG 166  Labs 07/15/18: TSH 1.54, free T4 1.2, free T3 3.5; CMP normal; cholesterol 176, triglycerides 74, HDL 53, LDL 107; urinary microalbumin/creatinine ratio 6  Labs 04/21/18: HbA1c 8.2%, CBG 295  Labs 01/20/18: HbA1c 8.4%, CBG 206  Labs 10/30/17: HbA1c 9.0%, CBG 147  Labs 08/13/17: CBG 354  Labs 06/04/17: CBG 173; BMP normal  Labs 05/29/17: HbA1c 8.2%; TSH 1.84, free T4 1.2, free T3 3.4, CMP normal except for glucose 310 and potassium 5.8; LH 3.7, FSH 5.9, testosterone 634, free testosterone 76 (ref 46-224), estradiol 31; microalbumin/creatinine ratio 4; cholesterol 187, triglycerides 63, HDL 59, LDL 116  Labs 04/03/17: CBG 182  Labs 01/31/17: HbA1c 7.6%, CBG 341  Labs 10/30/16: HbA1c 7.5%  Labs 07/25/16: HbA1c 7.8%  Labs 04/25/16: HbA1c 7.7%  Labs 04/09/16: LH 3.4, FSH 6.0, testosterone 504, free testosterone 85.5 (ref 47-244); cholesterol 176, triglycerides 92, HDL 80,  LDL 88; CMP norma except for glucose 224; TSH 1.75, free T4 1.2, free T3 3.2; microalbumin/creatinine ratio 4  Labs 01/24/16: HbA1c 8.6%  Labs 10/20/15: HbA1c 8.1%  Labs 09/15/15: BMP normal except for glucose of 143 and calcium 8.3.  Labs 07/20/15: HbA1c 8.2%  Labs 03/25/15: HbA1c 7.8%  Labs 12/02/14: HbA1c 8.5%;  LH 4.0, FSH 8.2, testosterone 621.97; CMP normal; TSH 2.01, free T4 1.21, free T3 3.6; cholesterol 155, triglycerides 46, HDL  55, LDL 91; microalbumin/creatinine ratio 6.6  Labs 03/30/14: TSH 1.935, free T4 1.12, free T3 3.2; CMP: normal except for glucose 203; testosterone 624, FSH 6.3, LH 2.4; urine microalbumin/creatinine ratio 6.4; cholesterol 163, triglycerides 55, HDL 64, LDL 80  Labs 08/23/13: TSH 1.139; CMP normal except for a glucose of 145; CBC: WBC 5.4, Hgb 12.7, Hct 34.7%   Assessment and Plan:    ASSESSMENT:  1-2. Type 1 diabetes mellitus/hypoglycemia:   A. Scott Moran's HbA1c is still at 8.2%. His BGs are comparable.    B. His is not doing as well at checking BGs or bolusing at this visit. He is also not changing sites frequently enough. He has not used his sensors and is not interested in doing so.  C. He had 6 mild hypoglycemic BGs this month. All of the low BGs occurred between midnight and noon, 4 of which occurred when he slept in late.  3. Autonomic neuropathy and inappropriate sinus tachycardia: These problems are a little better.    4. Peripheral neuropathy: This problem is evident today.    5. Hypothyroid: He was euthyroid in December 2015, in May 2017, in June 2018, and again in August 2019 on his current dose of Synthroid.  6. Hypogonadotropic hypogonadism: His testosterone level in May 2017 was acceptable, but was lower than in December 2015. His testosterone level in June 2018 was good, but his free testosterone was relatively low for his age.  Depression still seems to be a major factor in his testosterone levels.  7. Hypertension: His SBP and DBP are elevated. He needs to take his lisinopril and exercise regularly.  8. Goiter/Hashimoto's disease: His goiter is smaller, now within the normal range. The pattern of waxing and waning of thyroid gland size is c/w evolving thyroiditis. The thyroiditis is clinically quiescent 9. Depression: This problem seems to be somewhat worse today, despite resuming escitalopram, although I'm not sure how long he has really been taking the medication. Going back to school had been a major plus for him. He does not exhibit any suicidal thoughts or emotions. He needs to continue to see Dr. [name] and to re-establish care with a psychiatrist. He also needs to take his escitalopram. Since he does not have a psychiatrist or a PCP to prescribe escitalopram, I had agreed to prescribe it for 30 days with 2 refills. That would have allowed him to find a  psychiatrist or PCP. Unfortunately, he is not interested in finding another psychiatrist.  10. Hypercholesterolemia: His cholesterol levels are elevated. Exercise and more consistent BG control would help.  11. Noncompliance: He was doing better immediately after starting the new sensor-pump combination, but has not done as well since discontinuing the sensor. I continue to hope that as Zaylyn ages and matures, he will do a better job of taking care of his T1DM and his life. Perhaps the escitalopram will help.       PLAN:  1. Diagnostic: I reviewed his lab results from today and last week and his BG printout.  2.  Therapeutic: Continue Synthroid, 25 mcg/day and escitalopram, 10 mg/ day. Resume lisinopril 2.5 mg/day. Give both correction boluses and food boluses. Continue his current basal rates:  MN: 0.800 5 AM: 0.800 8 AM: 0.650 Noon: 0.900 6 PM: 1.600 9 PM: 1.300 3. Patient education: Scott Moran is a very intelligent young man who knows intellectually what he should do and how to do it. Unfortunately, he continues to lack the commitment to be as consistent with his T1DM care as he needs to be. If he will start using the Guardian 3 sensor and take advantage of all the benefits of the Medtronic 670G pump and sensor combination, then his BG control can be much better. 4. Follow-up: 3 months with me  Level of Service: This visit lasted in excess of 45 minutes. More than 50% of the visit was devoted to counseling.  [name] Sers, MD, CDE Adult and Pediatric Endocrinology 07/21/2018 11:39 AM

## 2018-07-23 ENCOUNTER — Telehealth (INDEPENDENT_AMBULATORY_CARE_PROVIDER_SITE_OTHER): Payer: Self-pay | Admitting: "Endocrinology

## 2018-07-23 NOTE — Telephone Encounter (Signed)
°  Who's calling (name and relationship to patient) : [employer] [employer]  Best contact number: [phone]  Provider they see: Fransico MichaelBrennan  Reason for call: Called stated he faxed a form over for Dr Fransico MichaelBrennan earlier today for patient.  Please call that he received the form.      PRESCRIPTION REFILL ONLY  Name of prescription:  Pharmacy:

## 2018-07-23 NOTE — Telephone Encounter (Signed)
Given to front staff.

## 2018-09-11 DIAGNOSIS — E109 Type 1 diabetes mellitus without complications: Secondary | ICD-10-CM | POA: Diagnosis not present

## 2018-10-27 ENCOUNTER — Ambulatory Visit (INDEPENDENT_AMBULATORY_CARE_PROVIDER_SITE_OTHER): Payer: BLUE CROSS/BLUE SHIELD | Admitting: "Endocrinology

## 2018-10-27 ENCOUNTER — Encounter (INDEPENDENT_AMBULATORY_CARE_PROVIDER_SITE_OTHER): Payer: Self-pay | Admitting: "Endocrinology

## 2018-10-27 VITALS — BP 132/74 | HR 68 | Ht 67.24 in | Wt 144.6 lb

## 2018-10-27 DIAGNOSIS — E11649 Type 2 diabetes mellitus with hypoglycemia without coma: Secondary | ICD-10-CM | POA: Diagnosis not present

## 2018-10-27 DIAGNOSIS — I10 Essential (primary) hypertension: Secondary | ICD-10-CM

## 2018-10-27 DIAGNOSIS — E1042 Type 1 diabetes mellitus with diabetic polyneuropathy: Secondary | ICD-10-CM

## 2018-10-27 DIAGNOSIS — E1043 Type 1 diabetes mellitus with diabetic autonomic (poly)neuropathy: Secondary | ICD-10-CM | POA: Diagnosis not present

## 2018-10-27 DIAGNOSIS — IMO0001 Reserved for inherently not codable concepts without codable children: Secondary | ICD-10-CM

## 2018-10-27 DIAGNOSIS — Z9119 Patient's noncompliance with other medical treatment and regimen: Secondary | ICD-10-CM

## 2018-10-27 DIAGNOSIS — E049 Nontoxic goiter, unspecified: Secondary | ICD-10-CM

## 2018-10-27 DIAGNOSIS — E1065 Type 1 diabetes mellitus with hyperglycemia: Secondary | ICD-10-CM

## 2018-10-27 DIAGNOSIS — I4711 Inappropriate sinus tachycardia, so stated: Secondary | ICD-10-CM

## 2018-10-27 DIAGNOSIS — Z91199 Patient's noncompliance with other medical treatment and regimen due to unspecified reason: Secondary | ICD-10-CM

## 2018-10-27 DIAGNOSIS — F320 Major depressive disorder, single episode, mild: Secondary | ICD-10-CM

## 2018-10-27 DIAGNOSIS — Z23 Encounter for immunization: Secondary | ICD-10-CM | POA: Diagnosis not present

## 2018-10-27 DIAGNOSIS — E063 Autoimmune thyroiditis: Secondary | ICD-10-CM

## 2018-10-27 DIAGNOSIS — R000 Tachycardia, unspecified: Secondary | ICD-10-CM

## 2018-10-27 LAB — POCT GLYCOSYLATED HEMOGLOBIN (HGB A1C): HEMOGLOBIN A1C: 8 % — AB (ref 4.0–5.6)

## 2018-10-27 LAB — POCT GLUCOSE (DEVICE FOR HOME USE): POC Glucose: 250 mg/dl — AB (ref 70–99)

## 2018-10-27 NOTE — Patient Instructions (Signed)
Follow up in 3 months

## 2018-10-27 NOTE — Progress Notes (Signed)
Subjective:  Patient Name: Scott Moran Date of Birth: 12-Oct-1991  MRN: [id number]  Scott Moran  presents to Scott office today for follow-Moran of his type 1 diabetes mellitus, goiter, hypoglycemia, depression, social anxiety disorder, avoidant personality disorder, hypothyroidism, thyroiditis, diabetic autonomic neuropathy, inappropriate sinus tachycardia, non-proliferative diabetic retinopathy, diabetic peripheral neuropathy, fatigue, hypogonadotropic hypogonadism, and medical non-compliance.  HISTORY OF PRESENT ILLNESS:   Scott Moran is a 27 y.o. Caucasian young man. Scott Moran was unaccompanied.   1. I first saw Scott patient in consultation on 03/27/05 in [facility] in [place], [place]. He had been referred by his primary care provider, [name], in [place], [place], for evaluation and treatment of poorly controlled type 1 diabetes. Scott patient was 27 years old.  A. Scott patient had been diagnosed with type 1 diabetes in February of 1995 at age 10. He was on an insulin pump when I first met him. His hemoglobin A1c had increased from 7.1% in December of 2005 to 8.2% in April 2006. He was having frequent snacks that he was not taking boluses for. He was also frequently noncompliant with checking blood sugars and taking meal boluses. I adjusted his basal rates.  B. At his next [place] visit on 06/19/05, he was still very noncompliant. His mental status was normal. He had a 25 gram goiter. His father was serving in [place] with [place] [employer]. There was a lot of stress at home. I again adjusted his basal rates.  C. At Scott end of July 2006 we closed our satellite office in [place]. Scott patient elected to be followed at our clinic here in [place]. I have followed him here ever since.  2. During Scott past thirteen years, Scott patient has had several clinical issues.   A. T1DM:    1). Scott patient's blood glucose control has usually been fairly good for  a teenager/young adult. His maximum hemoglobin A1c occurred on 04/16/06 when Scott hemoglobin A1c was 12.1%. Since then Scott hemoglobin A1c values have varied between 6.9% and 8.6%, until his HbA1c on 10/30/17 of 9.0%.    2). He is still often noncompliant with checking blood sugars, bolusing, and changing his insulin pump sites on time, but he is more compliant and more careful than he used to be. He has never required hospital re-admission for DKA, severe hyperglycemia, or hypoglycemia.   3). He started his new Medtronic 670G insulin pump and Guardian 3 sensor in September 2017, but later stopped using Scott sensor and has refused to resume using it.    B. Goiter, Hashimoto's thyroiditis, and hypothyroidism: In late 2008 and early 2009 Scott patient developed hypothyroidism secondary to Hashimoto's disease. I started him on Synthroid, 25 mcg per day. When he takes his medication, he is euthyroid.  C. Autonomic neuropathy and inappropriate sinus tachycardia: In late 2008 he developed autonomic neuropathy which manifested as inappropriate sinus tachycardia. These problems have varied in parallel with his BGs and HbA1c values.  D. Diabetic peripheral neuropathy: Scott patient has had symptoms and signs of mild diabetic peripheral neuropathy at several times in Scott past. His peripheral neuropathic signs and symptoms have similarly varied in parallel with his BGs and HbA1c values.   E. Non-proliferative diabetic retinopathy: This problem was identified during a diabetic eye exam in June 2010.  F. Hypogonadotropic hypogonadism: Scott patient has always been somewhat lackadaisical and relatively passive. On 06/29/10 his FSH was 4.9, LH was 2.4, testosterone was 399.66, and free testosterone was 79.8. Scott  testosterone and free testosterone were relatively low for an 27 year old young man. At Scott same time Scott [facility] and LH while "normal", appeared to be actually inappropriately low for Scott level of testosterone he had. We  discussed Scott options for treatment including exercise and medication, to include AndroGel. Scott patient chose Scott exercise option. By 10/19/10 Scott Scott Moran was 6.8, Scott LH was 3.2, total testosterone was 615.88 and Scott free testosterone was 131.6. In retrospect, when he was more depressed he may have had less gonadotropic function.   G. Depression/Anxiety/Personality disorders:    1). Scott patient has been diagnosed with depression at different times in Scott past. For some time he took Remeron (mirtazapine), but discontinued that medication in early 2012. I had been trying to convince him for some time that he was depressed and that he should seek out help. He eventually did decide to seek psychiatric help.    2). On 06/08/13 Scott Moran was evaluated by Scott Moran, a noted psychiatrist in [place].  Scott Moran and his staff performed neuropsychiatric testing. On Scott Scott Moran had problems with non-verbal reasoning and emotional intelligence. Scott Moran diagnosed Scott Moran with Social Anxiety Disorder and Avoidant Personality Disorder. Scott Moran prescribed escitalopram, 10 mg/day. Scott Moran to see Scott Moran in follow Moran. Unfortunately, Scott Moran.    3). Scott Moran was then referred to a therapist, Dr. Celso Moran. Scott Moran enjoyed working with Dr. [name] in Scott past and felt that he had made progress over time. Unfortunately, Scott Moran has not been back to see Dr. [name] for more than one year.   H. 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   3. Scott patient's last [facility] visit was on 07/21/18. At that visit I continued his Medtronic 670G insulin pump settings and his Synthroid, escitalopram, and lisinopril. In Scott interim he has been healthy.   A. He is taking one on-site course at [employer] and 3 on-line courses through [employer].   B. He is  still not using his Guardian 3 sensor. He says maybe he doesn't have Scott desire to use it.  Rebeca Alert has not seen Dr. [name], his therapist, due to conflicts with his academic schedule.   Juanda Bond takes his Synthroid dose of 25 mcg/day every night. He frequently takes escitalopram. He only rarely takes lisinopril.   E. He has his learner's permit, but has not wanted to try driving. He is severely afraid of other drivers that are terrible.   4. Pertinent Review of Systems:  Constitutional: Eitan feels "pretty okay". He feels that he is not as depressed as he was and that although he is still somewhat depressed, he can still function. His life is going "pretty good" overall. His allergies are not acting Moran much.  Eyes: Vision is good with his glasses. There are no significant eye complaints. Last eye exam was in late October or early November 2019. There were no signs of diabetic retinopathy.  Mouth: No problems.  Neck: Scott patient has no complaints of anterior neck swelling, soreness, tenderness,  pressure, discomfort, or difficulty swallowing.  Heart: Heart rate increases with exercise or other physical activity. Scott patient has no complaints of palpitations, irregular heat beats, chest pain, or chest pressure. Gastrointestinal: No postprandial bloating. Bowel movents seem normal. Scott patient has no other complaints of excessive hunger, acid reflux, upset stomach,  stomach aches or pains, diarrhea, or constipation. Legs: Muscle mass and strength seem normal. There are no complaints of numbness, tingling, burning, or pain. No edema is noted. Feet: There are no obvious foot problems. There are no complaints of numbness, tingling, burning, or pain. No edema is noted. Hypoglycemia: He has had "a few" low BGs recently in Scott 50s-70s.   None were severe.   Emotional/psychological: Scott patient feels that he is "pretty good overall".   Diabetes identification: He has his pump on and a wallet card.  .   5. BG printout: We have data for Scott past two weeks. He is changing his sites every 4-5 days. He is checking BGs 0-7 times per day, for an average of 2.4 times per day, compared with 2.5 times per day at his last visit. . Scott longest time between BG checks was 36 hours. He boluses 1-7 times per day, but many of Scott boluses are only food boluses. Average BG is 284, compared with 217 at his last visit and with 182 at his prior visit. BG range is 80 - >400, compared with 55 - >400 at his last visit and with 60-372 at his prior visit.. Scott  longer he leaves sites in, Scott longer he goes between BG checks and insulin boluses, and Scott less frequently he takes correction boluses, Scott more variable his BGs have been. He had no BGs <80 in Scott past 2 weeks  He had 3 BGs >400 when there was a problem with his sites.      PAST MEDICAL, FAMILY, AND SOCIAL HISTORY:  Past Medical History:  Diagnosis Date  . DM type 1 with diabetic peripheral neuropathy ([place])   . Goiter   . Hypertension   . Hypoglycemia associated with diabetes ([place])   . Hypogonadotropic hypogonadism ([place])   . Hypothyroidism, acquired, autoimmune   . Tachycardia   . Thyroiditis, autoimmune   . Type 1 diabetes mellitus not at goal [facility])   . Type 1 diabetes mellitus with diabetic autonomic neuropathy ([facility])     Family History  Problem Relation Age of Onset  . Diabetes Maternal Uncle   . Hypertension Maternal Grandmother   . Thyroid disease Neg Hx   . Obesity Neg Hx   . Kidney disease Neg Hx   . Cancer Neg Hx   . Anemia Neg Hx      Current Outpatient Medications:  .  escitalopram (LEXAPRO) 10 MG tablet, Take 1 tablet (10 mg total) by mouth daily., Disp: 30 tablet, Rfl: 4 .  insulin aspart (NOVOLOG) 100 UNIT/ML injection, INJECT 300 UNITS INTO PUMP EVERY 48-72 HOURS AND PER PROTOCOL FOR HYPERGLYCEMIA, Disp: 50 mL, Rfl: 5 .  levothyroxine (SYNTHROID, LEVOTHROID) 25 MCG tablet, TAKE 1 TABLET BY MOUTH DAILY BEFORE BREAKFAST, Disp: 30  tablet, Rfl: 5 .  lisinopril (PRINIVIL,ZESTRIL) 2.5 MG tablet, Take 1 tablet (2.5 mg total) by mouth daily., Disp: 30 tablet, Rfl: 11 .  acetone, urine, test strip, Check ketones per protocol, Disp: 50 each, Rfl: 3 .  glucagon (GLUCAGON EMERGENCY) 1 MG injection, Inject 1 mg intramuscularly in thigh 1 time for severe hypoglycemia if unresponsive, unconscious, unable to swallow or has a seizure., Disp: 2 kit, Rfl: 3 .  glucose blood (BAYER CONTOUR NEXT TEST) test strip, Check sugar 10 x daily, Disp: 300 each, Rfl: 3 .  insulin lispro (HUMALOG) 100 UNIT/ML injection, Moran to 300 units of insulin in insulin pump every 48-72 hours pre DKA and Hyperglycemia protocols (Patient not taking:  Reported on 10/27/2018), Disp: 40 mL, Rfl: 5 .  ondansetron (ZOFRAN ODT) 4 MG disintegrating tablet, Take 1 tablet (4 mg total) by mouth every 8 (eight) hours as needed for nausea or vomiting. (Patient not taking: Reported on 10/30/2016), Disp: 20 tablet, Rfl: 0 .  promethazine (PHENERGAN) 25 MG tablet, Take 1 tablet (25 mg total) by mouth every 6 (six) hours as needed for nausea or vomiting. (Patient not taking: Reported on 10/30/2016), Disp: 20 tablet, Rfl: 0  Allergies as of 10/27/2018  . (No Known Allergies)    1. Work and Family: Patient is taking on-site and on-line courses at [employer].  He is doing well. His grades are good.  2. Activities: Few physical activities. He is a history and politics buff. He frequently participates in online forum discussions about historical and political topics. 3. Smoking, alcohol, or drugs: None 4. Primary Care Provider: He does not have a current primary care provider. He used to see [name]. 5. Psychiatry: He no longer has a psychiatrist.  6. Psychologist: he has not seen Dr. Celso Moran for quite some time.   REVIEW OF SYSTEMS: There are no other significant problems involving Roxie's other body systems.   Objective:  Vital Signs:  BP 132/74   Pulse 68   Ht 5' 7.24"  (1.708 m)   Wt 144 lb 9.6 oz (65.6 kg)   BMI 22.48 kg/m     Ht Readings from Last 3 Encounters:  10/27/18 5' 7.24" (1.708 m)  07/21/18 5' 7.52" (1.715 m)  01/20/18 5' 7.44" (1.713 m)   Wt Readings from Last 3 Encounters:  10/27/18 144 lb 9.6 oz (65.6 kg)  07/21/18 147 lb (66.7 kg)  04/21/18 149 lb 6.4 oz (67.8 kg)   PHYSICAL EXAM: Constitutional: Jaimes was alert and cognitively normal today. His affect was very upbeat and he was very talkative. His insight was good today. His weight has decreased 3 pounds since last visit. He has been less consistent in his DM care since his last visit.  Face: Scott face appears normal.  Eyes: There is no obvious arcus or proptosis. Moisture appears normal. Mouth: Scott oropharynx and tongue appear normal. Oral moisture is normal. Neck: Scott neck looks normal. No carotid bruits are noted. Scott thyroid gland is again at about 20 grams in size today. Both lobes are normal in size today. Scott consistency of Scott thyroid gland is normal today. Scott thyroid gland is not tender to palpation.  Lungs: Scott lungs are clear to auscultation. Air movement is good. Heart: Heart rate remains tachycardic. Heart rhythm is regular. Heart sounds S1 and S2 are normal. I did not appreciate any pathologic cardiac murmurs. Abdomen: Scott abdomen is fairly normal in size. Bowel sounds are normal. There is no obvious hepatomegaly, splenomegaly, or other mass effect.  Arms: Muscle size and bulk are normal for age. Hands: There is no obvious tremor. Phalangeal and metacarpophalangeal joints are normal. Palmar muscles are normal. Palmar skin is normal. Palmar moisture is also normal. Legs: Muscles appear normal for age. No edema is present. Feet: Feet are normally formed. Dorsalis pedal pulses are 1+ bilaterally.   Neurologic: Strength is normal for age in both Scott upper and lower extremities. Muscle tone is normal. Sensation to touch is normal in both legs, but decreased in his left heel.       LAB DATA:   Results for orders placed or performed in visit on 10/27/18 (from Scott past 504 hour(s))  POCT Glucose (Device for Home  Use)   Collection Time: 10/27/18  2:02 PM  Result Value Ref Range   Glucose Fasting, POC     POC Glucose 250 (A) 70 - 99 mg/dl  POCT glycosylated hemoglobin (Hb A1C)   Collection Time: 10/27/18  2:03 PM  Result Value Ref Range   Hemoglobin A1C 8.0 (A) 4.0 - 5.6 %   HbA1c POC (<> result, manual entry)     HbA1c, POC (prediabetic range)     HbA1c, POC (controlled diabetic range)     Labs 10/27/18: HbA1c 8.0%, CBG 250  Labs 07/21/18: HbA1c 8.2%, CBG 166  Labs 07/15/18: TSH 1.54, free T4 1.2, free T3 3.5; CMP normal; cholesterol 176, triglycerides 74, HDL 53, LDL 107; urinary microalbumin/creatinine ratio 6  Labs 04/21/18: HbA1c 8.2%, CBG 295  Labs 01/20/18: HbA1c 8.4%, CBG 206  Labs 10/30/17: HbA1c 9.0%, CBG 147  Labs 08/13/17: CBG 354  Labs 06/04/17: CBG 173; BMP normal  Labs 05/29/17: HbA1c 8.2%; TSH 1.84, free T4 1.2, free T3 3.4, CMP normal except for glucose 310 and potassium 5.8; LH 3.7, FSH 5.9, testosterone 634, free testosterone 76 (ref 46-224), estradiol 31; microalbumin/creatinine ratio 4; cholesterol 187, triglycerides 63, HDL 59, LDL 116  Labs 04/03/17: CBG 182  Labs 01/31/17: HbA1c 7.6%, CBG 341  Labs 10/30/16: HbA1c 7.5%  Labs 07/25/16: HbA1c 7.8%  Labs 04/25/16: HbA1c 7.7%  Labs 04/09/16: LH 3.4, FSH 6.0, testosterone 504, free testosterone 85.5 (ref 47-244); cholesterol 176, triglycerides 92, HDL 80, LDL 88; CMP norma except for glucose 224; TSH 1.75, free T4 1.2, free T3 3.2; microalbumin/creatinine ratio 4  Labs 01/24/16: HbA1c 8.6%  Labs 10/20/15: HbA1c 8.1%  Labs 09/15/15: BMP normal except for glucose of 143 and calcium 8.3.  Labs 07/20/15: HbA1c 8.2%  Labs 03/25/15: HbA1c 7.8%  Labs 12/02/14: HbA1c 8.5%;  LH 4.0, FSH 8.2, testosterone 621.97; CMP normal; TSH 2.01, free T4 1.21, free T3 3.6; cholesterol 155, triglycerides  46, HDL  55, LDL 91; microalbumin/creatinine ratio 6.6  Labs 03/30/14: TSH 1.935, free T4 1.12, free T3 3.2; CMP: normal except for glucose 203; testosterone 624, FSH 6.3, LH 2.4; urine microalbumin/creatinine ratio 6.4; cholesterol 163, triglycerides 55, HDL 64, LDL 80  Labs 08/23/13: TSH 1.139; CMP normal except for a glucose of 145; CBC: WBC 5.4, Hgb 12.7, Hct 34.7%   Assessment and Plan:   ASSESSMENT:  1-2. Type 1 diabetes mellitus/hypoglycemia:   A. Bevan's HbA1c has decreased to 8.0%, but his average BG has increased. He appears to be having more lower BGs than he recognizes.    B. His is not doing as well at checking BGs or bolusing at this visit as he needs to do. He is also not changing sites frequently enough, although he has been doing better recently. He has not used his sensor and is not interested in doing so.  C. He had no documented low BGs in Scott past two weeks. .  3. Autonomic neuropathy and inappropriate sinus tachycardia: These problems are better.    4. Peripheral neuropathy: This problem is mildly evident today.    5. Hypothyroid: He was euthyroid in December 2015, in May 2017, in June 2018, and again in August 2019 on his current dose of Synthroid.  6. Hypogonadotropic hypogonadism: His testosterone level in May 2017 was acceptable, but was lower than in December 2015. His testosterone level in June 2018 was good, but his free testosterone was relatively low for his age.  Depression still seems to be a major factor in his  testosterone levels.  7. Hypertension: His DBP is elevated. He needs to take his lisinopril and exercise regularly.  8. Goiter/Hashimoto's disease: His goiter is smaller, now within Scott normal range. Scott pattern of waxing and waning of thyroid gland size is c/w evolving thyroiditis. Scott thyroiditis is clinically quiescent 9. Depression: This problem is much better today.  He says that he is taking Scott escitalopram more reliably. Going back to school has  also been a major plus for him. He does not exhibit any suicidal thoughts or emotions. He needs to Moran to see Dr. [name] and to re-establish care with a psychiatrist. He also needs to take his escitalopram. Since he does not have a psychiatrist or a PCP to prescribe escitalopram, I had agreed to prescribe it for 30 days with 2 refills. That would have allowed him to find a psychiatrist or PCP. Unfortunately, he is not interested in finding another psychiatrist.  10. Hypercholesterolemia: His cholesterol levels are elevated. Exercise and more consistent BG control would help.  11. Noncompliance: He does better some days and worse other days. He is consistently inconsistent. I Moran to hope that as Arinze ages and matures, he will do a better job of taking care of his T1DM and his life.     PLAN:  1. Diagnostic: I reviewed his lab results from last visit and his BG printout.  2. Therapeutic: Moran Synthroid, 25 mcg/day and escitalopram, 10 mg/ day. Resume lisinopril 2.5 mg/day. Give both correction boluses and food boluses. Moran his current basal rates:  MN: 0.800 5 AM: 0.800 8 AM: 0.650 Noon: 0.900 6 PM: 1.600 9 PM: 1.300 3. Patient education: Khiree is a very intelligent young man who knows intellectually what he should do and how to do it. Unfortunately, he continues to lack Scott commitment to be as consistent with his T1DM care as he needs to be. If he will start using Scott Guardian 3 sensor and take advantage of all Scott benefits of Scott Medtronic 670G pump and sensor combination, then his BG control can be much better. 4. Follow-Moran: 3 months with me  Level of Service: This visit lasted in excess of 55 minutes. More than 50% of Scott visit was devoted to counseling.  Scott Moran Sers, MD, CDE Adult and Pediatric Endocrinology 10/27/2018 2:16 PM

## 2019-01-10 ENCOUNTER — Other Ambulatory Visit (INDEPENDENT_AMBULATORY_CARE_PROVIDER_SITE_OTHER): Payer: Self-pay | Admitting: "Endocrinology

## 2019-01-10 DIAGNOSIS — E038 Other specified hypothyroidism: Secondary | ICD-10-CM

## 2019-01-27 ENCOUNTER — Encounter (INDEPENDENT_AMBULATORY_CARE_PROVIDER_SITE_OTHER): Payer: Self-pay | Admitting: "Endocrinology

## 2019-01-27 ENCOUNTER — Ambulatory Visit (INDEPENDENT_AMBULATORY_CARE_PROVIDER_SITE_OTHER): Payer: BLUE CROSS/BLUE SHIELD | Admitting: "Endocrinology

## 2019-01-27 VITALS — BP 118/62 | HR 80 | Ht 67.17 in | Wt 149.7 lb

## 2019-01-27 DIAGNOSIS — E1043 Type 1 diabetes mellitus with diabetic autonomic (poly)neuropathy: Secondary | ICD-10-CM | POA: Diagnosis not present

## 2019-01-27 DIAGNOSIS — E063 Autoimmune thyroiditis: Secondary | ICD-10-CM

## 2019-01-27 DIAGNOSIS — E1042 Type 1 diabetes mellitus with diabetic polyneuropathy: Secondary | ICD-10-CM | POA: Diagnosis not present

## 2019-01-27 DIAGNOSIS — I10 Essential (primary) hypertension: Secondary | ICD-10-CM

## 2019-01-27 DIAGNOSIS — E230 Hypopituitarism: Secondary | ICD-10-CM

## 2019-01-27 DIAGNOSIS — E1065 Type 1 diabetes mellitus with hyperglycemia: Secondary | ICD-10-CM

## 2019-01-27 DIAGNOSIS — E049 Nontoxic goiter, unspecified: Secondary | ICD-10-CM

## 2019-01-27 DIAGNOSIS — E7800 Pure hypercholesterolemia, unspecified: Secondary | ICD-10-CM

## 2019-01-27 DIAGNOSIS — E10649 Type 1 diabetes mellitus with hypoglycemia without coma: Secondary | ICD-10-CM | POA: Diagnosis not present

## 2019-01-27 DIAGNOSIS — IMO0001 Reserved for inherently not codable concepts without codable children: Secondary | ICD-10-CM

## 2019-01-27 DIAGNOSIS — F32A Depression, unspecified: Secondary | ICD-10-CM

## 2019-01-27 DIAGNOSIS — R000 Tachycardia, unspecified: Secondary | ICD-10-CM

## 2019-01-27 DIAGNOSIS — F329 Major depressive disorder, single episode, unspecified: Secondary | ICD-10-CM

## 2019-01-27 LAB — POCT GLYCOSYLATED HEMOGLOBIN (HGB A1C): Hemoglobin A1C: 7.4 % — AB (ref 4.0–5.6)

## 2019-01-27 LAB — POCT GLUCOSE (DEVICE FOR HOME USE): POC Glucose: 231 mg/dl — AB (ref 70–99)

## 2019-01-27 NOTE — Patient Instructions (Signed)
Follow up visit in 3 months. 

## 2019-01-27 NOTE — Progress Notes (Signed)
Subjective:  Patient Name: Scott Moran Date of Birth: 03-31-1991  MRN: [id number]  Scott Moran  presents to the office today for follow-up of his type 1 diabetes mellitus, goiter, hypoglycemia, depression, social anxiety disorder, avoidant personality disorder, hypothyroidism, thyroiditis, diabetic autonomic neuropathy, inappropriate sinus tachycardia, non-proliferative diabetic retinopathy, diabetic peripheral neuropathy, fatigue, hypogonadotropic hypogonadism, and medical non-compliance.  HISTORY OF PRESENT ILLNESS:   Scott Moran is a 28 y.o. Caucasian young man. [name] was unaccompanied.   1. I first saw the patient in consultation on 03/27/05 in the [facility] in [place], [place]. He had been referred by his primary care provider, [name], in [place], [place], for evaluation and treatment of poorly controlled type 1 diabetes. The patient was 28 years old.  A. The patient had been diagnosed with type 1 diabetes in February of 1995 at age 25. He was on an insulin pump when I first met him. His hemoglobin A1c had increased from 7.1% in December of 2005 to 8.2% in April 2006. He was having frequent snacks that he was not taking boluses for. He was also frequently noncompliant with checking blood sugars and taking meal boluses. I adjusted his basal rates.  B. At his next [place] visit on 06/19/05, he was still very noncompliant. His mental status was normal. He had a 25 gram goiter. His father was serving in [place] with the [employer]. There was a lot of stress at home. I again adjusted his basal rates.  C. At the end of July 2006 we closed our satellite office in [place]. The patient elected to be followed at our clinic here in [place]. I have followed him here ever since.  2. During the past fourteen years, the patient has had several clinical issues.   A. T1DM:    1). The patient's blood glucose control has usually been fairly good  for a teenager/young adult. His maximum hemoglobin A1c occurred on 04/16/06 when the hemoglobin A1c was 12.1%. Since then the hemoglobin A1c values have varied between 6.9% and 8.6%, until his HbA1c on 10/30/17 of 9.0%. Since then his HbA1c has varied from 8.0% to 8.4%.    2). He is still often noncompliant with checking blood sugars, bolusing, and changing his insulin pump sites on time, but he is more compliant and more careful than he used to be. He has never required hospital re-admission for DKA, severe hyperglycemia, or hypoglycemia.   3). He started his new Medtronic 670G insulin pump and Guardian 3 sensor in September 2017, but later stopped using the sensor and has refused to resume using it.    B. Goiter, Hashimoto's thyroiditis, and hypothyroidism: In late 2008 and early 2009 the patient developed hypothyroidism secondary to Hashimoto's disease. I started him on Synthroid, 25 mcg per day. When he takes his medication, he is euthyroid.  C. Autonomic neuropathy and inappropriate sinus tachycardia: In late 2008 he developed autonomic neuropathy which manifested as inappropriate sinus tachycardia. These problems have varied in parallel with his BGs and HbA1c values.  D. Diabetic peripheral neuropathy: The patient has had symptoms and signs of mild diabetic peripheral neuropathy at several times in the past. His peripheral neuropathic signs and symptoms have similarly varied in parallel with his BGs and HbA1c values.   E. Non-proliferative diabetic retinopathy: This problem was identified during a diabetic eye exam in June 2010.  F. Hypogonadotropic hypogonadism: The patient has always been somewhat lackadaisical and relatively passive. On 06/29/10 his [place] was 4.9, LH  was 2.4, testosterone was 399.66, and free testosterone was 79.8. The testosterone and free testosterone were relatively low for an 28 year old young man. At the same time the [facility] and LH while "normal", appeared to be actually  inappropriately low for the level of testosterone he had. We discussed the options for treatment including exercise and medication, to include AndroGel. The patient chose the exercise option. By 10/19/10 the [facility] was 6.8, the LH was 3.2, total testosterone was 615.88 and the free testosterone was 131.6. In retrospect, when he was more depressed he may have had less gonadotropic function.   G. Depression/Anxiety/Personality disorders:    1). The patient has been diagnosed with depression at different times in the past. For some time he took Remeron (mirtazapine), but discontinued that medication in early 2012. I had been trying to convince him for some time that he was depressed and that he should seek out help. He eventually did decide to seek psychiatric help.    2). On 06/08/13 Izeah was evaluated by [name], a noted psychiatrist in [place].  [name] and his staff performed neuropsychiatric testing. On the MAKAEL STEIN had problems with non-verbal reasoning and emotional intelligence. 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 [name] prescribed escitalopram, 10 mg/day. Effrey was supposed to continue to see [name] in follow up. Unfortunately, Eugean did not follow up.    3). Delaine was then referred to a therapist, [name]. Gabrielle enjoyed working with Dr. [name] in the past and felt that he had made progress over time. Unfortunately, Stanislav has not been back to see Dr. [name] for more than one year.   H. 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   3. The patient's last [facility] visit was on 10/27/18. At that visit I continued his Medtronic 670G insulin pump settings and his Synthroid, escitalopram, and lisinopril. In the interim he has been healthy, except for a recent "stomach  bug".    A. He is taking one on-site course at [employer] and 2 on-line courses through [employer].   B. He is still not using his Guardian 3 sensor. He is thinking about trying an upgrade from Medtronic.   Rebeca Alert has not seen Dr. [name], his therapist, due to conflicts with his academic schedule.   Juanda Bond takes his Synthroid dose of 25 mcg/day every night. He takes escitalopram and lisinopril daily.    E. He has his learner's permit, but has not wanted to try driving. He is severely afraid of other drivers that are "terrible".   4. Pertinent Review of Systems:  Constitutional: Arville feels "a bit tired, but otherwise pretty good". He feels that he is "not super depressed". He enjoys his on-site course at [employer]. His life is going "pretty good" overall. His allergies are not acting up much.  Eyes: Vision is good with his glasses. There are no significant eye complaints. Last eye exam was in late October or early November 2019. There were no signs of diabetic retinopathy.  Mouth: No problems.  Neck: The patient has no complaints of anterior neck swelling, soreness, tenderness,  pressure, discomfort, or difficulty swallowing.  Heart: Heart rate increases with exercise or other physical activity. The patient has no complaints of palpitations, irregular heat beats, chest pain, or chest pressure. Gastrointestinal: As above. No postprandial bloating. Bowel movents seem  normal. The patient has no other complaints of excessive hunger, acid reflux, upset stomach, stomach aches or pains, diarrhea, or constipation. Legs: Muscle mass and strength seem normal. There are no complaints of numbness, tingling, burning, or pain. No edema is noted. Feet: There are no obvious foot problems. There are no complaints of numbness, tingling, burning, or pain. No edema is noted. Hypoglycemia: He has had "a few" low BGs recently.   None were severe.   Emotional/psychological: The patient feels that he is "pretty good overall".    Diabetes identification: He has his pump on and a wallet card.  .  5. BG printout: We have data for the past two weeks. He is changing his sites every 4-5 days. He is checking BGs 0-6 times per day, for an average of 1.8 times per day, compared with 2.3 times per day. On 6 of the past 14 days he did not check BGs. His longest duration without checking BGs was for 5 days. He boluses 1-6 times per day, but many of the boluses are only food boluses. Average BG is 252, compared with 284 at his last visit and with 217 at his prior visit. BG range is 51 - >400, compared with 80 - >400 at his last unit and with 55 - >400 at his prior visit. All of his higher BGs occurred when his sites were left in too long. The  longer he leaves sites in, the longer he goes between BG checks and insulin boluses, and the less frequently he takes correction boluses, the more variable his BGs have been. He had 2 BGs <80 in the past 2 weeks  He had 2 BGs >400 when there was a problem with his sites.      PAST MEDICAL, FAMILY, AND SOCIAL HISTORY:  Past Medical History:  Diagnosis Date  . DM type 1 with diabetic peripheral neuropathy ([place])   . Goiter   . Hypertension   . Hypoglycemia associated with diabetes ([place])   . Hypogonadotropic hypogonadism ([place])   . Hypothyroidism, acquired, autoimmune   . Tachycardia   . Thyroiditis, autoimmune   . Type 1 diabetes mellitus not at goal [facility])   . Type 1 diabetes mellitus with diabetic autonomic neuropathy ([facility])     Family History  Problem Relation Age of Onset  . Diabetes Maternal Uncle   . Hypertension Maternal Grandmother   . Thyroid disease Neg Hx   . Obesity Neg Hx   . Kidney disease Neg Hx   . Cancer Neg Hx   . Anemia Neg Hx      Current Outpatient Medications:  .  acetone, urine, test strip, Check ketones per protocol, Disp: 50 each, Rfl: 3 .  escitalopram (LEXAPRO) 10 MG tablet, Take 1 tablet (10 mg total) by mouth daily., Disp: 30 tablet, Rfl: 4 .  glucagon  (GLUCAGON EMERGENCY) 1 MG injection, Inject 1 mg intramuscularly in thigh 1 time for severe hypoglycemia if unresponsive, unconscious, unable to swallow or has a seizure., Disp: 2 kit, Rfl: 3 .  glucose blood (BAYER CONTOUR NEXT TEST) test strip, Check sugar 10 x daily, Disp: 300 each, Rfl: 3 .  insulin lispro (HUMALOG) 100 UNIT/ML injection, Up to 300 units of insulin in insulin pump every 48-72 hours pre DKA and Hyperglycemia protocols, Disp: 40 mL, Rfl: 5 .  levothyroxine (SYNTHROID, LEVOTHROID) 25 MCG tablet, TAKE 1 TABLET BY MOUTH DAILY BEFORE BREAKFAST, Disp: 30 tablet, Rfl: 5 .  lisinopril (PRINIVIL,ZESTRIL) 2.5 MG tablet, Take 1 tablet (  2.5 mg total) by mouth daily., Disp: 30 tablet, Rfl: 11 .  insulin aspart (NOVOLOG) 100 UNIT/ML injection, INJECT 300 UNITS INTO PUMP EVERY 48-72 HOURS AND PER PROTOCOL FOR HYPERGLYCEMIA (Patient not taking: Reported on 01/27/2019), Disp: 50 mL, Rfl: 5 .  ondansetron (ZOFRAN ODT) 4 MG disintegrating tablet, Take 1 tablet (4 mg total) by mouth every 8 (eight) hours as needed for nausea or vomiting. (Patient not taking: Reported on 10/30/2016), Disp: 20 tablet, Rfl: 0 .  promethazine (PHENERGAN) 25 MG tablet, Take 1 tablet (25 mg total) by mouth every 6 (six) hours as needed for nausea or vomiting. (Patient not taking: Reported on 10/30/2016), Disp: 20 tablet, Rfl: 0  Allergies as of 01/27/2019  . (No Known Allergies)    1. Work and Family: Patient is taking on-site and on-line courses at [employer].  He is doing well. His grades are good.  2. Activities: Few physical activities. He is a history and politics buff. He frequently participates in online forum discussions about historical and political topics. 3. Smoking, alcohol, or drugs: None 4. Primary Care Provider: He does not have a current primary care provider. He used to see [name]. 5. Psychiatry: He no longer has a psychiatrist.  6. Psychologist: He has not seen [name] for quite some  time.   REVIEW OF SYSTEMS: There are no other significant problems involving Ercell's other body systems.   Objective:  Vital Signs:  BP 118/62   Pulse 80   Ht 5' 7.17" (1.706 m)   Wt 149 lb 11.2 oz (67.9 kg)   BMI 23.33 kg/m     Ht Readings from Last 3 Encounters:  01/27/19 5' 7.17" (1.706 m)  10/27/18 5' 7.24" (1.708 m)  07/21/18 5' 7.52" (1.715 m)   Wt Readings from Last 3 Encounters:  01/27/19 149 lb 11.2 oz (67.9 kg)  10/27/18 144 lb 9.6 oz (65.6 kg)  07/21/18 147 lb (66.7 kg)   PHYSICAL EXAM: Constitutional: Jamez was alert and cognitively normal today. His affect was very upbeat and he was very talkative. His insight was good today. His weight has increased 5 pounds since last visit. He has been more consistent in his DM care since his last visit.  Face: The face appears normal.  Eyes: There is no obvious arcus or proptosis. Moisture appears normal. Mouth: The oropharynx and tongue appear normal. Oral moisture is normal. Neck: The neck looks normal. No carotid bruits are noted. The thyroid gland is slightly larger at about 21 grams in size today. Both lobes are mildly enlarged in size today. The consistency of the thyroid gland is normal today. The thyroid gland is not tender to palpation.  Lungs: The lungs are clear to auscultation. Air movement is good. Heart: Heart rate remains tachycardic. Heart rhythm is regular. Heart sounds S1 and S2 are normal. I did not appreciate any pathologic cardiac murmurs. Abdomen: The abdomen is fairly normal in size. Bowel sounds are normal. There is no obvious hepatomegaly, splenomegaly, or other mass effect.  Arms: Muscle size and bulk are normal for age. Hands: There is no obvious tremor. Phalangeal and metacarpophalangeal joints are normal. Palmar muscles are normal. Palmar skin is normal. Palmar moisture is also normal. Legs: Muscles appear normal for age. No edema is present. Feet: Feet are normally formed. Dorsalis pedal pulses  are 1+ bilaterally.   Neurologic: Strength is normal for age in both the upper and lower extremities. Muscle tone is normal. Sensation to touch is normal in both  legs, but decreased in his right heel.      LAB DATA:   Results for orders placed or performed in visit on 01/27/19 (from the past 504 hour(s))  POCT Glucose (Device for Home Use)   Collection Time: 01/27/19  1:27 PM  Result Value Ref Range   Glucose Fasting, POC     POC Glucose 231 (A) 70 - 99 mg/dl  POCT glycosylated hemoglobin (Hb A1C)   Collection Time: 01/27/19  1:37 PM  Result Value Ref Range   Hemoglobin A1C 7.4 (A) 4.0 - 5.6 %   HbA1c POC (<> result, manual entry)     HbA1c, POC (prediabetic range)     HbA1c, POC (controlled diabetic range)     Labs 01/27/19: HbA1c 7.4%, CBG 231  Labs 10/27/18: HbA1c 8.0%, CBG 250  Labs 07/21/18: HbA1c 8.2%, CBG 166  Labs 07/15/18: TSH 1.54, free T4 1.2, free T3 3.5; CMP normal; cholesterol 176, triglycerides 74, HDL 53, LDL 107; urinary microalbumin/creatinine ratio 6  Labs 04/21/18: HbA1c 8.2%, CBG 295  Labs 01/20/18: HbA1c 8.4%, CBG 206  Labs 10/30/17: HbA1c 9.0%, CBG 147  Labs 08/13/17: CBG 354  Labs 06/04/17: CBG 173; BMP normal  Labs 05/29/17: HbA1c 8.2%; TSH 1.84, free T4 1.2, free T3 3.4, CMP normal except for glucose 310 and potassium 5.8; LH 3.7, FSH 5.9, testosterone 634, free testosterone 76 (ref 46-224), estradiol 31; microalbumin/creatinine ratio 4; cholesterol 187, triglycerides 63, HDL 59, LDL 116  Labs 04/03/17: CBG 182  Labs 01/31/17: HbA1c 7.6%, CBG 341  Labs 10/30/16: HbA1c 7.5%  Labs 07/25/16: HbA1c 7.8%  Labs 04/25/16: HbA1c 7.7%  Labs 04/09/16: LH 3.4, FSH 6.0, testosterone 504, free testosterone 85.5 (ref 47-244); cholesterol 176, triglycerides 92, HDL 80, LDL 88; CMP norma except for glucose 224; TSH 1.75, free T4 1.2, free T3 3.2; microalbumin/creatinine ratio 4  Labs 01/24/16: HbA1c 8.6%  Labs 10/20/15: HbA1c 8.1%  Labs 09/15/15: BMP normal except  for glucose of 143 and calcium 8.3.  Labs 07/20/15: HbA1c 8.2%  Labs 03/25/15: HbA1c 7.8%  Labs 12/02/14: HbA1c 8.5%;  LH 4.0, FSH 8.2, testosterone 621.97; CMP normal; TSH 2.01, free T4 1.21, free T3 3.6; cholesterol 155, triglycerides 46, HDL  55, LDL 91; microalbumin/creatinine ratio 6.6  Labs 03/30/14: TSH 1.935, free T4 1.12, free T3 3.2; CMP: normal except for glucose 203; testosterone 624, FSH 6.3, LH 2.4; urine microalbumin/creatinine ratio 6.4; cholesterol 163, triglycerides 55, HDL 64, LDL 80  Labs 08/23/13: TSH 1.139; CMP normal except for a glucose of 145; CBC: WBC 5.4, Hgb 12.7, Hct 34.7%   Assessment and Plan:   ASSESSMENT:  1-2. Type 1 diabetes mellitus/hypoglycemia:   A. Tylon's HbA1c has decreased to 7.4%, but his average BG has only decreased from 284 to 252. He appears to be having more lower BGs than he recognizes.    B. His is doing a bit better at changing sites. He is also doing a bit better at checking BGs and bolusing. He has not used his sensor and is not interested in doing so.  C. He had 2 documented low BGs in the past two weeks. .  3. Autonomic neuropathy and inappropriate sinus tachycardia: These problems are better.    4. Peripheral neuropathy: This problem is mildly evident today.    5. Hypothyroid: He was euthyroid in December 2015, in May 2017, in June 2018, and again in August 2019 on his current dose of Synthroid.  6. Hypogonadotropic hypogonadism: His testosterone level in May 2017 was acceptable,  but was lower than in December 2015. His testosterone level in June 2018 was good, but his free testosterone was relatively low for his age.  Depression still seems to be a major factor in his testosterone levels.  7. Hypertension: His BP is normal today. He needs to take his lisinopril and exercise regularly.  8. Goiter/Hashimoto's disease: His goiter is a bit larger today. The pattern of waxing and waning of thyroid gland size is c/w evolving thyroiditis. The  thyroiditis is clinically quiescent 9. Depression: This problem is better today.  He says that he is taking the escitalopram reliably. Going back to school has also been a major plus for him. He does not exhibit any suicidal thoughts or emotions. He needs to continue to see Dr. [name] and to re-establish care with a psychiatrist. He also needs to take his escitalopram. Since he does not have a psychiatrist or a PCP to prescribe escitalopram, I had agreed to prescribe it for 30 days with 2 refills. That would have allowed him to find a psychiatrist or PCP. Unfortunately, he is not interested in finding another psychiatrist, so I have continued to prescribe the escitalopram for him.   10. Hypercholesterolemia: His cholesterol levels were elevated in August 2019. Exercise and more consistent BG control would help.  11. Noncompliance: He does better some days and worse other days, but is doing a bit better overall. He is consistently inconsistent with his DM care. I continue to hope that as Reginal ages and matures, he will do a better job of taking care of his T1DM and his life.     PLAN:  1. Diagnostic: I reviewed his BG printout. We will order annual lab tests at his next visit.  2. Therapeutic: Continue Synthroid, 25 mcg/day, escitalopram, 10 mg/ day, and lisinopril 2.5 mg/day. Give both correction boluses and food boluses. Continue his current basal rates:  MN: 0.800 5 AM: 0.800 8 AM: 0.650 Noon: 0.900 6 PM: 1.600 9 PM: 1.300 3. Patient education: Seymore is a very intelligent young man who knows intellectually what he should do and how to do it. Unfortunately, he continues to lack the commitment to be as consistent with his T1DM care as he needs to be. If he will start using the Guardian 3 sensor and take advantage of all the benefits of the Medtronic 670G pump and sensor combination, then his BG control can be much better. 4. Follow-up: 3 months with me  Level of Service: This visit lasted in  excess of 55 minutes. More than 50% of the visit was devoted to counseling.  [name] Sers, MD, CDE Adult and Pediatric Endocrinology 01/27/2019 1:37 PM

## 2019-02-12 ENCOUNTER — Other Ambulatory Visit (INDEPENDENT_AMBULATORY_CARE_PROVIDER_SITE_OTHER): Payer: Self-pay | Admitting: *Deleted

## 2019-02-12 ENCOUNTER — Telehealth (INDEPENDENT_AMBULATORY_CARE_PROVIDER_SITE_OTHER): Payer: Self-pay | Admitting: "Endocrinology

## 2019-02-12 DIAGNOSIS — IMO0001 Reserved for inherently not codable concepts without codable children: Secondary | ICD-10-CM

## 2019-02-12 DIAGNOSIS — E1065 Type 1 diabetes mellitus with hyperglycemia: Principal | ICD-10-CM

## 2019-02-12 MED ORDER — INSULIN ASPART 100 UNIT/ML ~~LOC~~ SOLN: SUBCUTANEOUS | 5 refills | Status: DC

## 2019-02-12 NOTE — Telephone Encounter (Signed)
Who's calling (name and relationship to patient) : Designer, industrial/product (mom) **DPR on file to speak with mom** Best contact number:  [phone]  Provider they see: Dr. Fransico Alexandr  Reason for call:  Mom called in stating that they are receiving one vile of insulin a month, mom thinks they need 2. Mom wants to know if they need a hand written script for this incase they have to use another pharmacy.   Call ID:      PRESCRIPTION REFILL ONLY  Name of prescription:  Pharmacy:

## 2019-02-12 NOTE — Telephone Encounter (Signed)
Returned TC to mother, she said that they need a new Rx for the insulin. They are paying over 300 for vial. Advised of [employer] program for the $99 insulin program to go online and apply. If any questions please let us know. Mother ok with information given.

## 2019-02-17 ENCOUNTER — Telehealth (INDEPENDENT_AMBULATORY_CARE_PROVIDER_SITE_OTHER): Payer: Self-pay | Admitting: "Endocrinology

## 2019-02-17 NOTE — Telephone Encounter (Signed)
Returned TC to verify that Novolog order has been cancelled, dues to previous call.

## 2019-02-17 NOTE — Telephone Encounter (Signed)
°  Who's calling (name and relationship to patient) : cover my meds Aram Beecham) Best contact number: [phone] Provider they see: Fransico Ry Reason for call: Please call to verify that the request for Novalog to be canceled is correct.  Ref # [id number]    PRESCRIPTION REFILL ONLY  Name of prescription:  Pharmacy:

## 2019-04-28 ENCOUNTER — Encounter (INDEPENDENT_AMBULATORY_CARE_PROVIDER_SITE_OTHER): Payer: Self-pay | Admitting: "Endocrinology

## 2019-04-28 ENCOUNTER — Ambulatory Visit (INDEPENDENT_AMBULATORY_CARE_PROVIDER_SITE_OTHER): Payer: BLUE CROSS/BLUE SHIELD | Admitting: "Endocrinology

## 2019-04-28 ENCOUNTER — Other Ambulatory Visit: Payer: Self-pay

## 2019-04-28 VITALS — BP 122/74 | HR 100 | Wt 151.2 lb

## 2019-04-28 DIAGNOSIS — Z91199 Patient's noncompliance with other medical treatment and regimen due to unspecified reason: Secondary | ICD-10-CM

## 2019-04-28 DIAGNOSIS — R000 Tachycardia, unspecified: Secondary | ICD-10-CM

## 2019-04-28 DIAGNOSIS — E10649 Type 1 diabetes mellitus with hypoglycemia without coma: Secondary | ICD-10-CM | POA: Diagnosis not present

## 2019-04-28 DIAGNOSIS — E1065 Type 1 diabetes mellitus with hyperglycemia: Secondary | ICD-10-CM

## 2019-04-28 DIAGNOSIS — Z9119 Patient's noncompliance with other medical treatment and regimen: Secondary | ICD-10-CM

## 2019-04-28 DIAGNOSIS — E1043 Type 1 diabetes mellitus with diabetic autonomic (poly)neuropathy: Secondary | ICD-10-CM

## 2019-04-28 DIAGNOSIS — E1042 Type 1 diabetes mellitus with diabetic polyneuropathy: Secondary | ICD-10-CM | POA: Diagnosis not present

## 2019-04-28 DIAGNOSIS — E7800 Pure hypercholesterolemia, unspecified: Secondary | ICD-10-CM

## 2019-04-28 DIAGNOSIS — I10 Essential (primary) hypertension: Secondary | ICD-10-CM

## 2019-04-28 DIAGNOSIS — E291 Testicular hypofunction: Secondary | ICD-10-CM

## 2019-04-28 DIAGNOSIS — E063 Autoimmune thyroiditis: Secondary | ICD-10-CM

## 2019-04-28 DIAGNOSIS — IMO0001 Reserved for inherently not codable concepts without codable children: Secondary | ICD-10-CM

## 2019-04-28 DIAGNOSIS — E049 Nontoxic goiter, unspecified: Secondary | ICD-10-CM

## 2019-04-28 DIAGNOSIS — F329 Major depressive disorder, single episode, unspecified: Secondary | ICD-10-CM

## 2019-04-28 LAB — POCT URINALYSIS DIPSTICK: Glucose, UA: POSITIVE — AB

## 2019-04-28 LAB — POCT GLYCOSYLATED HEMOGLOBIN (HGB A1C): Hemoglobin A1C: 7.7 % — AB (ref 4.0–5.6)

## 2019-04-28 LAB — POCT GLUCOSE (DEVICE FOR HOME USE): POC Glucose: 394 mg/dl — AB (ref 70–99)

## 2019-04-28 NOTE — Patient Instructions (Signed)
Follow up visit in 3 months. 

## 2019-04-28 NOTE — Progress Notes (Signed)
Subjective:  Patient Name: Scott Moran Date of Birth: 03-31-1991  MRN: [id number]  Scott Moran  presents to the office today for follow-up of his type 1 diabetes mellitus, goiter, hypoglycemia, depression, social anxiety disorder, avoidant personality disorder, hypothyroidism, thyroiditis, diabetic autonomic neuropathy, inappropriate sinus tachycardia, non-proliferative diabetic retinopathy, diabetic peripheral neuropathy, fatigue, hypogonadotropic hypogonadism, and medical non-compliance.  HISTORY OF PRESENT ILLNESS:   Scott Moran is a 28 y.o. Caucasian young man. Scott Moran was unaccompanied.   1. I first saw the patient in consultation on 03/27/05 in the [facility] in [place], [place]. He had been referred by his primary care provider, [name], in [place], [place], for evaluation and treatment of poorly controlled type 1 diabetes. The patient was 28 years old.  A. The patient had been diagnosed with type 1 diabetes in February of 1995 at age 25. He was on an insulin pump when I first met him. His hemoglobin A1c had increased from 7.1% in December of 2005 to 8.2% in April 2006. He was having frequent snacks that he was not taking boluses for. He was also frequently noncompliant with checking blood sugars and taking meal boluses. I adjusted his basal rates.  B. At his next [place] visit on 06/19/05, he was still very noncompliant. His mental status was normal. He had a 25 gram goiter. His father was serving in [place] with [employer]. There was a lot of stress at home. I again adjusted his basal rates.  C. At the end of July 2006 we closed our satellite office in [place]. The patient elected to be followed at our clinic here in [place]. I have followed him here ever since.  2. During the past fourteen years, the patient has had several clinical issues.   A. T1DM:    1). The patient's blood glucose control has usually been fairly good  for a teenager/young adult. His maximum hemoglobin A1c occurred on 04/16/06 when the hemoglobin A1c was 12.1%. Since then the hemoglobin A1c values have varied between 6.9% and 8.6%, until his HbA1c on 10/30/17 of 9.0%. Since then his HbA1c has varied from 8.0% to 8.4%.    2). He is still often noncompliant with checking blood sugars, bolusing, and changing his insulin pump sites on time, but he is more compliant and more careful than he used to be. He has never required hospital re-admission for DKA, severe hyperglycemia, or hypoglycemia.   3). He started his new Medtronic 670G insulin pump and Guardian 3 sensor in September 2017, but later stopped using the sensor and has refused to resume using it.    B. Goiter, Hashimoto's thyroiditis, and hypothyroidism: In late 2008 and early 2009 the patient developed hypothyroidism secondary to Hashimoto's disease. I started him on Synthroid, 25 mcg per day. When he takes his medication, he is euthyroid.  C. Autonomic neuropathy and inappropriate sinus tachycardia: In late 2008 he developed autonomic neuropathy which manifested as inappropriate sinus tachycardia. These problems have varied in parallel with his BGs and HbA1c values.  D. Diabetic peripheral neuropathy: The patient has had symptoms and signs of mild diabetic peripheral neuropathy at several times in the past. His peripheral neuropathic signs and symptoms have similarly varied in parallel with his BGs and HbA1c values.   E. Non-proliferative diabetic retinopathy: This problem was identified during a diabetic eye exam in June 2010.  F. Hypogonadotropic hypogonadism: The patient has always been somewhat lackadaisical and relatively passive. On 06/29/10 his [place] was 4.9, LH  was 2.4, testosterone was 399.66, and free testosterone was 79.8. The testosterone and free testosterone were relatively low for an 28 year old young man. At the same time the [facility] and LH while "normal", appeared to be actually  inappropriately low for the level of testosterone he had. We discussed the options for treatment including exercise and medication, to include AndroGel. The patient chose the exercise option. By 10/19/10 the [facility] was 6.8, the LH was 3.2, total testosterone was 615.88 and the free testosterone was 131.6. In retrospect, when he was more depressed he may have had less gonadotropic function.   G. Depression/Anxiety/Personality disorders:    1). The patient has been diagnosed with depression at different times in the past. For some time he took Remeron (mirtazapine), but discontinued that medication in early 2012. I had been trying to convince him for some time that he was depressed and that he should seek out help. He eventually did decide to seek psychiatric help.    2). On 06/08/13 Scott Moran was evaluated by [name], a noted psychiatrist in [place].  [name] and his staff performed neuropsychiatric testing. On the CASTIN DONAGHUE had problems with non-verbal reasoning and emotional intelligence. 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 [name] prescribed escitalopram, 10 mg/day. Scott Moran was supposed to continue to see [name] in follow up. Unfortunately, Scott Moran did not follow up.    3). Scott Moran was then referred to a therapist, [name]. [place] enjoyed working with Dr. [name] in the past and felt that he had made progress over time. Unfortunately, Scott Moran has not been back to see Dr. [name] for more than one year.   H. 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   3. The patient's last [facility] visit was on 01/27/19. At that visit I continued his Medtronic 670G insulin pump settings and his Synthroid, escitalopram, and lisinopril doses. In the interim he has been healthy.    A. He is taking  on-line courses through [employer].   B. He is still not using his Guardian 3 sensor. He is thinking about trying an upgrade from Medtronic.   Rebeca Alert has not seen Dr. [name], his therapist, due to conflicts with his academic schedule and lack of interest.   D. Shalik takes his Synthroid dose of 25 mcg/day every night. He takes escitalopram 10 mg and  lisinopril 2.5 mg daily.    E. He has his learner's permit, but has not wanted to try driving. He is severely afraid of other drivers that are "terrible".   4. Pertinent Review of Systems:  Constitutional: Scott Moran feels "pretty good". He feels that he is "not depressed". He just finished another semester at [employer]. His life is going "pretty good" overall, but quieter during social distancing. His allergies are acting up a bit at times, but not much.  Eyes: Vision is good with his glasses. There are no significant eye complaints. Last eye exam was in late October or early November 2019. There were no signs of diabetic retinopathy.  Mouth: No problems.  Neck: The patient has no complaints of anterior neck swelling, soreness, tenderness,  pressure, discomfort, or difficulty swallowing.  Heart: Heart rate increases with exercise or other physical activity. The patient has no complaints of palpitations, irregular heat beats, chest pain, or chest pressure. Gastrointestinal: As above. No postprandial bloating. Bowel movents  seem normal. The patient has no other complaints of excessive hunger, acid reflux, upset stomach, stomach aches or pains, diarrhea, or constipation. Legs: Muscle mass and strength seem normal. There are no complaints of numbness, tingling, burning, or pain. No edema is noted. Feet: There are no obvious foot problems. There are no complaints of numbness, tingling, burning, or pain. No edema is noted. Hypoglycemia: He has had "a few" low BGs recently. None were severe.   Emotional/psychological: The patient feels that he is "pretty good".    Diabetes identification: He has his pump on and a wallet card.  .  5. BG printout: We have data for the past two weeks. He is changing his sites every 2-6 days. He is checking BGs 1-4 times per day, for an average of 2.3 times per day, compared with 1.4 times per day at his last visit.  . He boluses 1-11 times per day, but many of the boluses are only food boluses. Average BG is 222, compared with 252 at his last visit and with 284 at his prior visit. BG range is 48 - >400, compared with 51 - >400 qt his last visit and with 80 - >400 at his prior visit. All of his higher BGs occurred when his sites were left in too long. The  longer he leaves sites in, the longer he goes between BG checks and insulin boluses, and the less frequently he takes correction boluses, the more variable his BGs have been. He had 4 BGs <80 in the past 2 weeks, all following food boluses without having checked BGs prior.     PAST MEDICAL, FAMILY, AND SOCIAL HISTORY:  Past Medical History:  Diagnosis Date  . DM type 1 with diabetic peripheral neuropathy ([facility])   . Goiter   . Hypertension   . Hypoglycemia associated with diabetes ([place])   . Hypogonadotropic hypogonadism ([facility])   . Hypothyroidism, acquired, autoimmune   . Tachycardia   . Thyroiditis, autoimmune   . Type 1 diabetes mellitus not at goal [facility])   . Type 1 diabetes mellitus with diabetic autonomic neuropathy ([facility])     Family History  Problem Relation Age of Onset  . Diabetes Maternal Uncle   . Hypertension Maternal Grandmother   . Thyroid disease Neg Hx   . Obesity Neg Hx   . Kidney disease Neg Hx   . Cancer Neg Hx   . Anemia Neg Hx      Current Outpatient Medications:  .  acetone, urine, test strip, Check ketones per protocol, Disp: 50 each, Rfl: 3 .  escitalopram (LEXAPRO) 10 MG tablet, Take 1 tablet (10 mg total) by mouth daily., Disp: 30 tablet, Rfl: 4 .  glucagon (GLUCAGON EMERGENCY) 1 MG injection, Inject 1 mg intramuscularly in thigh 1 time  for severe hypoglycemia if unresponsive, unconscious, unable to swallow or has a seizure., Disp: 2 kit, Rfl: 3 .  glucose blood (BAYER CONTOUR NEXT TEST) test strip, Check sugar 10 x daily, Disp: 300 each, Rfl: 3 .  insulin aspart (NOVOLOG) 100 UNIT/ML injection, INJECT 300 UNITS INTO PUMP EVERY 48-72 HOURS AND PER PROTOCOL FOR HYPERGLYCEMIA, Disp: 30 mL, Rfl: 5 .  insulin lispro (HUMALOG) 100 UNIT/ML injection, Up to 300 units of insulin in insulin pump every 48-72 hours pre DKA and Hyperglycemia protocols, Disp: 40 mL, Rfl: 5 .  levothyroxine (SYNTHROID, LEVOTHROID) 25 MCG tablet, TAKE 1 TABLET BY MOUTH DAILY BEFORE BREAKFAST, Disp: 30 tablet, Rfl: 5 .  lisinopril (PRINIVIL,ZESTRIL) 2.5 MG tablet, Take  1 tablet (2.5 mg total) by mouth daily., Disp: 30 tablet, Rfl: 11 .  ondansetron (ZOFRAN ODT) 4 MG disintegrating tablet, Take 1 tablet (4 mg total) by mouth every 8 (eight) hours as needed for nausea or vomiting. (Patient not taking: Reported on 10/30/2016), Disp: 20 tablet, Rfl: 0 .  promethazine (PHENERGAN) 25 MG tablet, Take 1 tablet (25 mg total) by mouth every 6 (six) hours as needed for nausea or vomiting. (Patient not taking: Reported on 10/30/2016), Disp: 20 tablet, Rfl: 0  Allergies as of 04/28/2019  . (No Known Allergies)    1. Work and Family: Scott Moran is taking on-line courses at [employer].  He is doing well. His grades are good.  2. Activities: Few physical activities. He is a history and politics buff. He frequently participates in online forum discussions about historical and political topics. 3. Smoking, alcohol, or drugs: None 4. Primary Care Provider: He does not have a current primary care provider.  5. Psychiatry: He no longer has a psychiatrist.  6. Psychologist: He has not seen [name] for quite some time.   REVIEW OF SYSTEMS: There are no other significant problems involving Scott Moran's other body systems.   Objective:  Vital Signs:  BP 122/74   Pulse 100   Wt 151  lb 3.2 oz (68.6 kg)   BMI 23.57 kg/m     Ht Readings from Last 3 Encounters:  01/27/19 5' 7.17" (1.706 m)  10/27/18 5' 7.24" (1.708 m)  07/21/18 5' 7.52" (1.715 m)   Wt Readings from Last 3 Encounters:  04/28/19 151 lb 3.2 oz (68.6 kg)  01/27/19 149 lb 11.2 oz (67.9 kg)  10/27/18 144 lb 9.6 oz (65.6 kg)   PHYSICAL EXAM: Constitutional: Scott Moran was alert and cognitively normal today. His affect was very upbeat and he was very talkative. His insight was good today. His weight has increased 2 pounds since last visit. He has been more consistent in his DM care since his last visit.  Face: The face appears normal.  Eyes: There is no obvious arcus or proptosis. Moisture appears normal. Mouth: The oropharynx and tongue appear normal. Oral moisture is normal. Neck: The neck looks normal. No carotid bruits are noted. The thyroid gland has shrunk back to top-normal size of 20 grams in size today. Both lobes are normal in size today. The consistency of the thyroid gland is normal today. The thyroid gland is not tender to palpation.  Lungs: The lungs are clear to auscultation. Air movement is good. Heart: Heart rate remains tachycardic. Heart rhythm is regular. Heart sounds S1 and S2 are normal. I did not appreciate any pathologic cardiac murmurs. Abdomen: The abdomen is fairly normal in size. Bowel sounds are normal. There is no obvious hepatomegaly, splenomegaly, or other mass effect.  Arms: Muscle size and bulk are normal for age. Hands: There is no obvious tremor. Phalangeal and metacarpophalangeal joints are normal. Palmar muscles are normal. Palmar skin is normal. Palmar moisture is also normal. Legs: Muscles appear normal for age. No edema is present. Feet: Feet are normally formed. Dorsalis pedal pulses are 1+ bilaterally.   Neurologic: Strength is normal for age in both the upper and lower extremities. Muscle tone is normal. Sensation to touch is normal in both legs, but decreased in both  heels, more so on the right heel.      LAB DATA:   Results for orders placed or performed in visit on 04/28/19 (from the past 504 hour(s))  POCT Glucose (Device for  Home Use)   Collection Time: 04/28/19  1:37 PM  Result Value Ref Range   Glucose Fasting, POC     POC Glucose 394 (A) 70 - 99 mg/dl  POCT glycosylated hemoglobin (Hb A1C)   Collection Time: 04/28/19  1:39 PM  Result Value Ref Range   Hemoglobin A1C 7.7 (A) 4.0 - 5.6 %   HbA1c POC (<> result, manual entry)     HbA1c, POC (prediabetic range)     HbA1c, POC (controlled diabetic range)    POCT urinalysis dipstick   Collection Time: 04/28/19  1:45 PM  Result Value Ref Range   Color, UA     Clarity, UA     Glucose, UA Positive (A) Negative   Bilirubin, UA     Ketones, UA moderate    Spec Grav, UA     Blood, UA     pH, UA     Protein, UA     Urobilinogen, UA     Nitrite, UA     Leukocytes, UA     Appearance     Odor     Labs 04/28/19: HbA1c 7.7%, CBG 394; U/A positive glucose and moderate ketones  Labs 01/27/19: HbA1c 7.4%, CBG 231  Labs 10/27/18: HbA1c 8.0%, CBG 250  Labs 07/21/18: HbA1c 8.2%, CBG 166  Labs 07/15/18: TSH 1.54, free T4 1.2, free T3 3.5; CMP normal; cholesterol 176, triglycerides 74, HDL 53, LDL 107; urinary microalbumin/creatinine ratio 6  Labs 04/21/18: HbA1c 8.2%, CBG 295  Labs 01/20/18: HbA1c 8.4%, CBG 206  Labs 10/30/17: HbA1c 9.0%, CBG 147  Labs 08/13/17: CBG 354  Labs 06/04/17: CBG 173; BMP normal  Labs 05/29/17: HbA1c 8.2%; TSH 1.84, free T4 1.2, free T3 3.4, CMP normal except for glucose 310 and potassium 5.8; LH 3.7, FSH 5.9, testosterone 634, free testosterone 76 (ref 46-224), estradiol 31; microalbumin/creatinine ratio 4; cholesterol 187, triglycerides 63, HDL 59, LDL 116  Labs 04/03/17: CBG 182  Labs 01/31/17: HbA1c 7.6%, CBG 341  Labs 10/30/16: HbA1c 7.5%  Labs 07/25/16: HbA1c 7.8%  Labs 04/25/16: HbA1c 7.7%  Labs 04/09/16: LH 3.4, FSH 6.0, testosterone 504, free testosterone  85.5 (ref 47-244); cholesterol 176, triglycerides 92, HDL 80, LDL 88; CMP norma except for glucose 224; TSH 1.75, free T4 1.2, free T3 3.2; microalbumin/creatinine ratio 4  Labs 01/24/16: HbA1c 8.6%  Labs 10/20/15: HbA1c 8.1%  Labs 09/15/15: BMP normal except for glucose of 143 and calcium 8.3.  Labs 07/20/15: HbA1c 8.2%  Labs 03/25/15: HbA1c 7.8%  Labs 12/02/14: HbA1c 8.5%;  LH 4.0, FSH 8.2, testosterone 621.97; CMP normal; TSH 2.01, free T4 1.21, free T3 3.6; cholesterol 155, triglycerides 46, HDL  55, LDL 91; microalbumin/creatinine ratio 6.6  Labs 03/30/14: TSH 1.935, free T4 1.12, free T3 3.2; CMP: normal except for glucose 203; testosterone 624, FSH 6.3, LH 2.4; urine microalbumin/creatinine ratio 6.4; cholesterol 163, triglycerides 55, HDL 64, LDL 80  Labs 08/23/13: TSH 1.139; CMP normal except for a glucose of 145; CBC: WBC 5.4, Hgb 12.7, Hct 34.7%   Assessment and Plan:   ASSESSMENT:  1-2. Type 1 diabetes mellitus/hypoglycemia:  A. Scott Moran's HbA1c has increased to 7.7%. He appears to be having more higher BGs than he recognizes.    B. His is not doing better at changing sites. He is doing a bit better at checking BGs and bolusing. He has not used his sensor and is not interested in doing so.  C. He had 4 documented low BGs in the past two weeks, all  associated with giving food boluses without checking BGs first.  3. Autonomic neuropathy and inappropriate sinus tachycardia: These problems are still active.    4. Peripheral neuropathy: This problem is more evident today.    5. Hypothyroid: He was euthyroid in December 2015, in May 2017, in June 2018, and again in August 2019 on his current dose of Synthroid.  6. Hypogonadotropic hypogonadism: His testosterone level in May 2017 was acceptable, but was lower than in December 2015. His testosterone level in June 2018 was good, but his free testosterone was relatively low for his age.  Depression may be a major factor in his testosterone  levels.  7. Hypertension: His BP is normal today. He needs to take his lisinopril and exercise regularly.  8. Goiter/Hashimoto's disease: His goiter is smaller today. The pattern of waxing and waning of thyroid gland size is c/w evolving thyroiditis. The thyroiditis is clinically quiescent 9. Depression: This problem is better today.  He says that he is taking the escitalopram reliably. Going back to school has also been a major plus for him. He does not exhibit any suicidal thoughts or emotions. He needs to continue to see Dr. [name] and to re-establish care with a psychiatrist. He also needs to take his escitalopram. Since he does not have a psychiatrist or a PCP to prescribe escitalopram, I had agreed to prescribe it for 30 days with 2 refills. That would have allowed him to find a psychiatrist or PCP. Unfortunately, he is not interested in finding another psychiatrist, so I have continued to prescribe the escitalopram for him.   10. Hypercholesterolemia: His cholesterol levels were elevated in August 2019. Exercise and more consistent BG control would help.  11. Noncompliance: He does better some days and worse other days, but is doing a bit better overall. He is consistently inconsistent with his DM care. I continue to hope that as Dejaun ages and matures, he will do a better job of taking care of his T1DM and his life.     PLAN:  1. Diagnostic: I reviewed his BG printout. I ordered annual fasting lab tests to be done prior to his next visit.   2. Therapeutic: Continue Synthroid, 25 mcg/day, escitalopram, 10 mg/ day, and lisinopril 2.5 mg/day. Give both correction boluses and food boluses. Continue his current basal rates:  MN: 0.800 5 AM: 0.800 8 AM: 0.650 Noon: 0.900 6 PM: 1.600 9 PM: 1.300 3. Patient education: Tykeem is a very intelligent young man who knows intellectually what he should do and how to do it. Unfortunately, he continues to lack the commitment to be as consistent with his  T1DM care as he needs to be. If he will start using the Guardian 3 sensor and take advantage of all the benefits of the Medtronic 670G pump and sensor combination, then his BG control can be much better. 4. Follow-up: 3 months with me  Level of Service: This visit lasted in excess of 50 minutes. More than 50% of the visit was devoted to counseling.  [name] Sers, MD, CDE Adult and Pediatric Endocrinology 04/28/2019 2:07 PM

## 2019-06-11 ENCOUNTER — Other Ambulatory Visit (INDEPENDENT_AMBULATORY_CARE_PROVIDER_SITE_OTHER): Payer: Self-pay | Admitting: "Endocrinology

## 2019-06-11 DIAGNOSIS — F3289 Other specified depressive episodes: Secondary | ICD-10-CM

## 2019-07-15 DIAGNOSIS — E1065 Type 1 diabetes mellitus with hyperglycemia: Secondary | ICD-10-CM | POA: Diagnosis not present

## 2019-07-15 DIAGNOSIS — I10 Essential (primary) hypertension: Secondary | ICD-10-CM | POA: Diagnosis not present

## 2019-07-15 DIAGNOSIS — E7800 Pure hypercholesterolemia, unspecified: Secondary | ICD-10-CM | POA: Diagnosis not present

## 2019-07-15 DIAGNOSIS — E063 Autoimmune thyroiditis: Secondary | ICD-10-CM | POA: Diagnosis not present

## 2019-07-16 LAB — CBC WITH DIFFERENTIAL/PLATELET
Absolute Monocytes: 338 cells/uL (ref 200–950)
Basophils Absolute: 42 cells/uL (ref 0–200)
Basophils Relative: 0.8 %
Eosinophils Absolute: 88 cells/uL (ref 15–500)
Eosinophils Relative: 1.7 %
HCT: 47.5 % (ref 38.5–50.0)
Hemoglobin: 16.5 g/dL (ref 13.2–17.1)
Lymphs Abs: 2085 cells/uL (ref 850–3900)
MCH: 31 pg (ref 27.0–33.0)
MCHC: 34.7 g/dL (ref 32.0–36.0)
MCV: 89.1 fL (ref 80.0–100.0)
MPV: 10.3 fL (ref 7.5–12.5)
Monocytes Relative: 6.5 %
Neutro Abs: 2647 cells/uL (ref 1500–7800)
Neutrophils Relative %: 50.9 %
Platelets: 295 10*3/uL (ref 140–400)
RBC: 5.33 10*6/uL (ref 4.20–5.80)
RDW: 12.3 % (ref 11.0–15.0)
Total Lymphocyte: 40.1 %
WBC: 5.2 10*3/uL (ref 3.8–10.8)

## 2019-07-16 LAB — TESTOSTERONE TOTAL,FREE,BIO, MALES
Albumin: 4.2 g/dL (ref 3.6–5.1)
Sex Hormone Binding: 33 nmol/L (ref 10–50)
Testosterone, Bioavailable: 131.2 ng/dL (ref >=110.0)
Testosterone, Free: 68.1 pg/mL (ref 46.0–224.0)
Testosterone: 495 ng/dL (ref 250–827)

## 2019-07-16 LAB — T3, FREE: T3, Free: 2.8 pg/mL (ref 2.3–4.2)

## 2019-07-16 LAB — LUTEINIZING HORMONE: LH: 3.5 m[IU]/mL (ref 1.5–9.3)

## 2019-07-16 LAB — LIPID PANEL
Cholesterol: 177 mg/dL (ref <200)
HDL: 56 mg/dL (ref >=40)
LDL Cholesterol (Calc): 106 mg/dL (calc) — ABNORMAL HIGH
Non-HDL Cholesterol (Calc): 121 mg/dL (calc) (ref <130)
Total CHOL/HDL Ratio: 3.2 (calc) (ref <5.0)
Triglycerides: 65 mg/dL (ref <150)

## 2019-07-16 LAB — COMPREHENSIVE METABOLIC PANEL
AG Ratio: 1.4 (calc) (ref 1.0–2.5)
ALT: 35 U/L (ref 9–46)
AST: 19 U/L (ref 10–40)
Albumin: 4.2 g/dL (ref 3.6–5.1)
Alkaline phosphatase (APISO): 77 U/L (ref 36–130)
BUN: 7 mg/dL (ref 7–25)
CO2: 30 mmol/L (ref 20–32)
Calcium: 9.2 mg/dL (ref 8.6–10.3)
Chloride: 100 mmol/L (ref 98–110)
Creat: 0.92 mg/dL (ref 0.60–1.35)
Globulin: 3.1 g/dL (calc) (ref 1.9–3.7)
Glucose, Bld: 180 mg/dL — ABNORMAL HIGH (ref 65–99)
Potassium: 4.3 mmol/L (ref 3.5–5.3)
Sodium: 138 mmol/L (ref 135–146)
Total Bilirubin: 0.8 mg/dL (ref 0.2–1.2)
Total Protein: 7.3 g/dL (ref 6.1–8.1)

## 2019-07-16 LAB — MICROALBUMIN / CREATININE URINE RATIO
Creatinine, Urine: 292 mg/dL (ref 20–320)
Microalb Creat Ratio: 3 mcg/mg creat (ref <30)
Microalb, Ur: 0.9 mg/dL

## 2019-07-16 LAB — TSH: TSH: 1.9 mIU/L (ref 0.40–4.50)

## 2019-07-16 LAB — FOLLICLE STIMULATING HORMONE: FSH: 7.7 m[IU]/mL (ref 1.6–8.0)

## 2019-07-16 LAB — T4, FREE: Free T4: 1.2 ng/dL (ref 0.8–1.8)

## 2019-07-30 ENCOUNTER — Ambulatory Visit (INDEPENDENT_AMBULATORY_CARE_PROVIDER_SITE_OTHER): Payer: BLUE CROSS/BLUE SHIELD | Admitting: "Endocrinology

## 2019-08-13 ENCOUNTER — Other Ambulatory Visit: Payer: Self-pay

## 2019-08-13 ENCOUNTER — Encounter (INDEPENDENT_AMBULATORY_CARE_PROVIDER_SITE_OTHER): Payer: Self-pay | Admitting: "Endocrinology

## 2019-08-13 ENCOUNTER — Ambulatory Visit (INDEPENDENT_AMBULATORY_CARE_PROVIDER_SITE_OTHER): Payer: BC Managed Care – PPO | Admitting: "Endocrinology

## 2019-08-13 VITALS — BP 102/68 | HR 96 | Ht 67.13 in | Wt 153.8 lb

## 2019-08-13 DIAGNOSIS — E049 Nontoxic goiter, unspecified: Secondary | ICD-10-CM

## 2019-08-13 DIAGNOSIS — F330 Major depressive disorder, recurrent, mild: Secondary | ICD-10-CM

## 2019-08-13 DIAGNOSIS — E1065 Type 1 diabetes mellitus with hyperglycemia: Secondary | ICD-10-CM

## 2019-08-13 DIAGNOSIS — E10649 Type 1 diabetes mellitus with hypoglycemia without coma: Secondary | ICD-10-CM

## 2019-08-13 DIAGNOSIS — Z23 Encounter for immunization: Secondary | ICD-10-CM | POA: Diagnosis not present

## 2019-08-13 DIAGNOSIS — E063 Autoimmune thyroiditis: Secondary | ICD-10-CM | POA: Diagnosis not present

## 2019-08-13 DIAGNOSIS — E1043 Type 1 diabetes mellitus with diabetic autonomic (poly)neuropathy: Secondary | ICD-10-CM

## 2019-08-13 DIAGNOSIS — I10 Essential (primary) hypertension: Secondary | ICD-10-CM

## 2019-08-13 DIAGNOSIS — IMO0001 Reserved for inherently not codable concepts without codable children: Secondary | ICD-10-CM

## 2019-08-13 DIAGNOSIS — E1042 Type 1 diabetes mellitus with diabetic polyneuropathy: Secondary | ICD-10-CM

## 2019-08-13 LAB — POCT GLYCOSYLATED HEMOGLOBIN (HGB A1C): Hemoglobin A1C: 8.7 % — AB (ref 4.0–5.6)

## 2019-08-13 LAB — POCT GLUCOSE (DEVICE FOR HOME USE): POC Glucose: 264 mg/dl — AB (ref 70–99)

## 2019-08-13 NOTE — Progress Notes (Signed)
Subjective:  Patient Name: Brixon Kneisel Date of Birth: 01/25/1991  MRN: [id number]  Boen Fatica  presents to the office today for follow-up of his type 1 diabetes mellitus, goiter, hypoglycemia, depression, social anxiety disorder, avoidant personality disorder, hypothyroidism, thyroiditis, diabetic autonomic neuropathy, inappropriate sinus tachycardia, non-proliferative diabetic retinopathy, diabetic peripheral neuropathy, fatigue, hypogonadotropic hypogonadism, and medical non-compliance.  HISTORY OF PRESENT ILLNESS:   Geofrey is a 28 y.o. Caucasian young man. Digby was unaccompanied.   1. I first saw the patient in consultation on 03/27/05 in the [facility] in [place], [place]. He had been referred by his primary care provider, [name], in [place], [place], for evaluation and treatment of poorly controlled type 1 diabetes. The patient was 28 years old.  A. The patient had been diagnosed with type 1 diabetes in February of 1995 at age 28. He was on an insulin pump when I first met him. His hemoglobin A1c had increased from 7.1% in December of 2005 to 8.2% in April 2006. He was having frequent snacks that he was not taking boluses for. He was also frequently noncompliant with checking blood sugars and taking meal boluses. I adjusted his basal rates.  B. At his next [facility] visit on 06/19/05, he was still very noncompliant. His mental status was normal. He had a 25 gram goiter. His father was serving in [place] with the [employer]. There was a lot of stress at home. I again adjusted his basal rates.  C. At the end of July 2006 we closed our satellite office in [place]. The patient elected to be followed at our clinic here in [place]. I have followed him here ever since.  2. During the past fourteen years, the patient has had several clinical issues.   A. T1DM:    1). The patient's blood glucose control has usually been fairly good  for a teenager/young adult. His maximum hemoglobin A1c occurred on 04/16/06 when the hemoglobin A1c was 12.1%. Since then the hemoglobin A1c values have varied between 6.9% and 8.6%, until his HbA1c on 10/30/17 of 9.0%. Since then his HbA1c has varied from 7.4% to 8.4%.    2). He is still often noncompliant with checking blood sugars, bolusing, and changing his insulin pump sites on time, but he is more compliant and more careful than he used to be. He has never required hospital re-admission for DKA, severe hyperglycemia, or hypoglycemia.   3). He started his new Medtronic 670G insulin pump and Guardian 3 sensor in September 2017, but later stopped using the sensor and has refused to resume using it.    B. Goiter, Hashimoto's thyroiditis, and hypothyroidism: In late 2008 and early 2009 the patient developed hypothyroidism secondary to Hashimoto's disease. I started him on Synthroid, 25 mcg per day. When he takes his medication, he is euthyroid.  C. Autonomic neuropathy and inappropriate sinus tachycardia: In late 2008 he developed autonomic neuropathy which manifested as inappropriate sinus tachycardia. These problems have varied in parallel with his BGs and HbA1c values.  D. Diabetic peripheral neuropathy: The patient has had symptoms and signs of mild diabetic peripheral neuropathy at several times in the past. His peripheral neuropathic signs and symptoms have similarly varied in parallel with his BGs and HbA1c values.   E. Non-proliferative diabetic retinopathy: This problem was identified during a diabetic eye exam in June 2010.  F. Hypogonadotropic hypogonadism: The patient has always been somewhat lackadaisical and relatively passive. On 06/29/10 his [place] was 4.9, LH  was 2.4, testosterone was 399.66, and free testosterone was 79.8. The testosterone and free testosterone were relatively low for an 28 year old young man. At the same time the [facility] and LH while "normal", appeared to be actually  inappropriately low for the level of testosterone he had. We discussed the options for treatment including exercise and medication, to include AndroGel. The patient chose the exercise option. By 10/19/10 the [facility] was 6.8, the LH was 3.2, total testosterone was 615.88 and the free testosterone was 131.6. In retrospect, when he was more depressed he may have had less gonadotropic function.   G. Depression/Anxiety/Personality disorders:    1). The patient has been diagnosed with depression at different times in the past. For some time he took Remeron (mirtazapine), but discontinued that medication in early 2012. I had been trying to convince him for some time that he was depressed and that he should seek out help. He eventually did decide to seek psychiatric help.    2). On 06/08/13 Glendon was evaluated by [name], a noted psychiatrist in [place].  [name] and his staff performed neuropsychiatric testing. On the TRAYTON SZABO had problems with non-verbal reasoning and emotional intelligence. 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 [name] prescribed escitalopram, 10 mg/day. Parker was supposed to continue to see [name] in follow up. Unfortunately, Caprice did not follow up.    3). Adarian was then referred to a therapist, [name]. Dravon enjoyed working with Dr. [name] in the past and felt that he had made progress over time. Unfortunately, Zyrion has not been back to see Dr. [name] for more than one year.   H. 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   3. The patient's last [facility] visit was on 04/28/19. At that visit I continued his Medtronic 670G insulin pump settings and his Synthroid, escitalopram, and lisinopril doses. In the interim he has been healthy.    A. He is again taking  on-line courses through [employer].   B. He is still not using his Guardian 3 sensor. He is thinking about trying an upgrade from Medtronic.   Rebeca Alert has not seen Dr. [name], his therapist, due to conflicts with his academic schedule and lack of interest.   D. Carthel takes his Synthroid dose of 25 mcg/day every night. He takes escitalopram 10 mg and  lisinopril 2.5 mg daily.    E. He has his learner's permit, but has not wanted to try driving. He is severely afraid of other drivers that are "terrible".   4. Pertinent Review of Systems:  Constitutional: Lothar feels "pretty good". His life is going "pretty well" overall, but quieter during social distancing. His allergies are not acting much. He is frustrated by his parents' support for President Trump.  Eyes: Vision is good with his glasses. There are no significant eye complaints. Last eye exam was in late October or early November 2019. There were no signs of diabetic retinopathy.  Mouth: No problems.  Neck: The patient has no complaints of anterior neck swelling, soreness, tenderness,  pressure, discomfort, or difficulty swallowing.  Heart: Heart rate increases with exercise or other physical activity. The patient has no complaints of palpitations, irregular heat beats, chest pain, or chest pressure. Gastrointestinal: No postprandial bloating. Bowel movents seem normal. The patient has no other complaints of excessive hunger,  acid reflux, upset stomach, stomach aches or pains, diarrhea, or constipation. Legs: Muscle mass and strength seem normal. There are no complaints of numbness, tingling, burning, or pain. No edema is noted. Feet: There are no obvious foot problems. There are no complaints of numbness, tingling, burning, or pain. No edema is noted. Hypoglycemia: He has had "a few" low BGs recently. He had a 47 this morning upon awakening. None were severe.   Emotional/psychological: The patient feels that he is "pretty good".   Diabetes  identification: He has his pump on and a wallet card.  .  5. BG printout: We have data for the past four weeks. He is changing his sites every 6-8 days. He is checking BGs 0-4 times per day, for an average of 1.9 times per day, compared with 1.4 times per day at his last visit. He boluses 1-4 times per day, but many of the boluses are only food boluses. Average BG is 219, compared with 222 at his last visit and with 252 at his prior visit. BG range is 45 - >400, compared with 48 - >400 at his last visit and with 51 - >400 at his prior visit. All of his BGs >400 occurred when his sites were left in too long. The  longer he leaves sites in, the longer he goes between BG checks and insulin boluses, and the less frequently he takes correction boluses, the more variable his BGs have been. He had 3 BGs <80 in the past 4 weeks, at least 2 of which occurred when he took food boluses, but had not checked his BGs prior to taking the food boluses.     PAST MEDICAL, FAMILY, AND SOCIAL HISTORY:  Past Medical History:  Diagnosis Date  . DM type 1 with diabetic peripheral neuropathy ([place])   . Goiter   . Hypertension   . Hypoglycemia associated with diabetes ([place])   . Hypogonadotropic hypogonadism ([place])   . Hypothyroidism, acquired, autoimmune   . Tachycardia   . Thyroiditis, autoimmune   . Type 1 diabetes mellitus not at goal [facility])   . Type 1 diabetes mellitus with diabetic autonomic neuropathy ([facility])     Family History  Problem Relation Age of Onset  . Diabetes Maternal Uncle   . Hypertension Maternal Grandmother   . Thyroid disease Neg Hx   . Obesity Neg Hx   . Kidney disease Neg Hx   . Cancer Neg Hx   . Anemia Neg Hx      Current Outpatient Medications:  .  acetone, urine, test strip, Check ketones per protocol, Disp: 50 each, Rfl: 3 .  escitalopram (LEXAPRO) 10 MG tablet, TAKE 1 TABLET BY MOUTH ONCE DAILY, Disp: 30 tablet, Rfl: 4 .  glucagon (GLUCAGON EMERGENCY) 1 MG injection, Inject 1 mg  intramuscularly in thigh 1 time for severe hypoglycemia if unresponsive, unconscious, unable to swallow or has a seizure., Disp: 2 kit, Rfl: 3 .  glucose blood (BAYER CONTOUR NEXT TEST) test strip, Check sugar 10 x daily, Disp: 300 each, Rfl: 3 .  insulin aspart (NOVOLOG) 100 UNIT/ML injection, INJECT 300 UNITS INTO PUMP EVERY 48-72 HOURS AND PER PROTOCOL FOR HYPERGLYCEMIA, Disp: 30 mL, Rfl: 5 .  levothyroxine (SYNTHROID, LEVOTHROID) 25 MCG tablet, TAKE 1 TABLET BY MOUTH DAILY BEFORE BREAKFAST, Disp: 30 tablet, Rfl: 5 .  lisinopril (ZESTRIL) 2.5 MG tablet, TAKE 1 TABLET BY MOUTH ONCE DAILY, Disp: 30 tablet, Rfl: 11 .  insulin lispro (HUMALOG) 100 UNIT/ML injection, Up to 300 units  of insulin in insulin pump every 48-72 hours pre DKA and Hyperglycemia protocols (Patient not taking: Reported on 08/13/2019), Disp: 40 mL, Rfl: 5 .  ondansetron (ZOFRAN ODT) 4 MG disintegrating tablet, Take 1 tablet (4 mg total) by mouth every 8 (eight) hours as needed for nausea or vomiting. (Patient not taking: Reported on 08/13/2019), Disp: 20 tablet, Rfl: 0 .  promethazine (PHENERGAN) 25 MG tablet, Take 1 tablet (25 mg total) by mouth every 6 (six) hours as needed for nausea or vomiting. (Patient not taking: Reported on 08/13/2019), Disp: 20 tablet, Rfl: 0  Allergies as of 08/13/2019  . (No Known Allergies)    1. Work and Family: Dwan is again taking on-line courses at [employer].  He is doing well.  2. Activities: Few physical activities. He is a history and politics buff. He frequently participates in online forum discussions about historical and political topics. 3. Smoking, alcohol, or drugs: None 4. Primary Care Provider: He does not have a current primary care provider.  5. Psychiatry: He no longer has a psychiatrist.  6. Psychologist: He has not seen [name] for quite some time.   REVIEW OF SYSTEMS: There are no other significant problems involving Zamir's other body systems.   Objective:  Vital  Signs:  BP 102/68   Pulse 96   Ht 5' 7.13" (1.705 m)   Wt 153 lb 12.8 oz (69.8 kg)   BMI 24.00 kg/m     Ht Readings from Last 3 Encounters:  08/13/19 5' 7.13" (1.705 m)  01/27/19 5' 7.17" (1.706 m)  10/27/18 5' 7.24" (1.708 m)   Wt Readings from Last 3 Encounters:  08/13/19 153 lb 12.8 oz (69.8 kg)  04/28/19 151 lb 3.2 oz (68.6 kg)  01/27/19 149 lb 11.2 oz (67.9 kg)   PHYSICAL EXAM: Constitutional: Kiondre was alert and cognitively normal today. His weight has increased 2.5 pounds since last visit. His affect was upbeat and he was again very talkative. His insight was good today. He has been less consistent in his DM care since his last visit.  Face: The face appears normal.  Eyes: There is no obvious arcus or proptosis. Moisture appears normal. Mouth: The oropharynx and tongue appear normal. Oral moisture is normal. Neck: The neck looks normal. No carotid bruits are noted. The thyroid gland has shrunk back to top-normal size of 20 grams in size today. Both lobes are normal in size today. The consistency of the thyroid gland is normal today. The thyroid gland is not tender to palpation.  Lungs: The lungs are clear to auscultation. Air movement is good. Heart: Heart rate remains borderline tachycardic. Heart rhythm is regular. Heart sounds S1 and S2 are normal. I did not appreciate any pathologic cardiac murmurs. Abdomen: The abdomen is normal in size. Bowel sounds are normal. There is no obvious hepatomegaly, splenomegaly, or other mass effect.  Arms: Muscle size and bulk are normal for age. Hands: There is no obvious tremor. Phalangeal and metacarpophalangeal joints are normal. Palmar muscles are normal. Palmar skin is normal. Palmar moisture is also normal. Legs: Muscles appear normal for age. No edema is present. Feet: Feet are normally formed. Dorsalis pedal pulses are 1+ bilaterally.   Neurologic: Strength is normal for age in both the upper and lower extremities. Muscle tone is  normal. Sensation to touch is normal in both legs, but decreased in both heels.      LAB DATA:   Results for orders placed or performed in visit on 08/13/19 (from  the past 504 hour(s))  POCT Glucose (Device for Home Use)   Collection Time: 08/13/19  1:22 PM  Result Value Ref Range   Glucose Fasting, POC     POC Glucose 264 (A) 70 - 99 mg/dl   Labs 08/13/19: HbA1c 8.7%, CBG 264  Labs 07/15/19: TSH 1.90, free T4 1.2, free T3 2.8; CMP normal, except glucose 180; CBC normal; cholesterol 177, triglycerides 65, HDL 56, LDL 106; microalbumin/creatinine ratio 3; LH 3.5, FSH 7.7, testosterone 495 (ref 250-827), free testosterone 68.1 (ref 46-224)  Labs 04/28/19: HbA1c 7.7%, CBG 394; U/A positive glucose and moderate ketones  Labs 01/27/19: HbA1c 7.4%, CBG 231  Labs 10/27/18: HbA1c 8.0%, CBG 250  Labs 07/21/18: HbA1c 8.2%, CBG 166  Labs 07/15/18: TSH 1.54, free T4 1.2, free T3 3.5; CMP normal; cholesterol 176, triglycerides 74, HDL 53, LDL 107; urinary microalbumin/creatinine ratio 6  Labs 04/21/18: HbA1c 8.2%, CBG 295  Labs 01/20/18: HbA1c 8.4%, CBG 206  Labs 10/30/17: HbA1c 9.0%, CBG 147  Labs 08/13/17: CBG 354  Labs 06/04/17: CBG 173; BMP normal  Labs 05/29/17: HbA1c 8.2%; TSH 1.84, free T4 1.2, free T3 3.4, CMP normal except for glucose 310 and potassium 5.8; LH 3.7, FSH 5.9, testosterone 634, free testosterone 76 (ref 46-224), estradiol 31; microalbumin/creatinine ratio 4; cholesterol 187, triglycerides 63, HDL 59, LDL 116  Labs 04/03/17: CBG 182  Labs 01/31/17: HbA1c 7.6%, CBG 341  Labs 10/30/16: HbA1c 7.5%  Labs 07/25/16: HbA1c 7.8%  Labs 04/25/16: HbA1c 7.7%  Labs 04/09/16: LH 3.4, FSH 6.0, testosterone 504, free testosterone 85.5 (ref 47-244); cholesterol 176, triglycerides 92, HDL 80, LDL 88; CMP norma except for glucose 224; TSH 1.75, free T4 1.2, free T3 3.2; microalbumin/creatinine ratio 4  Labs 01/24/16: HbA1c 8.6%  Labs 10/20/15: HbA1c 8.1%  Labs 09/15/15: BMP normal except  for glucose of 143 and calcium 8.3.  Labs 07/20/15: HbA1c 8.2%  Labs 03/25/15: HbA1c 7.8%  Labs 12/02/14: HbA1c 8.5%;  LH 4.0, FSH 8.2, testosterone 621.97; CMP normal; TSH 2.01, free T4 1.21, free T3 3.6; cholesterol 155, triglycerides 46, HDL  55, LDL 91; microalbumin/creatinine ratio 6.6  Labs 03/30/14: TSH 1.935, free T4 1.12, free T3 3.2; CMP: normal except for glucose 203; testosterone 624, FSH 6.3, LH 2.4; urine microalbumin/creatinine ratio 6.4; cholesterol 163, triglycerides 55, HDL 64, LDL 80  Labs 08/23/13: TSH 1.139; CMP normal except for a glucose of 145; CBC: WBC 5.4, Hgb 12.7, Hct 34.7%   Assessment and Plan:   ASSESSMENT:  1-2. Type 1 diabetes mellitus/hypoglycemia:  A. Kennis's HbA1c has increased to 8.7%. He appears to be having many more higher BGs than he recognizes.    B. His is doing worse at changing sites. He is also not doing as well at checking BGs and bolusing.   C. He had 3 documented low BGs in the past four weeks, mostly associated with giving food boluses without checking BGs first.  3. Autonomic neuropathy and inappropriate sinus tachycardia: These problems are still active.    4. Peripheral neuropathy: This problem is again evident today.    5. Hypothyroid: He was euthyroid in December 2015, in May 2017, in June 2018, in August 2019, and in August 2020 on his current dose of Synthroid.  6. Hypogonadotropic hypogonadism: His testosterone level in May 2017 was acceptable, but was lower than in December 2015. His testosterone level in June 2018 was good, but his free testosterone was relatively low for his age.  His recent testosterone was normal, but relatively low  for his age.   7. Hypertension: His BP is normal today. He needs to take his lisinopril and exercise regularly.  8. Goiter/Hashimoto's disease: His goiter is smaller today. The pattern of waxing and waning of thyroid gland size is c/w evolving thyroiditis. The thyroiditis is clinically quiescent 9.  Depression: This problem is much better today.  He says that he is taking the escitalopram reliably. Going back to school has also been a major plus for him. He does not exhibit any suicidal thoughts or emotions. He needs to continue to see Dr. [name] and to re-establish care with a psychiatrist. He also needs to take his escitalopram. Since he does not have a psychiatrist or a PCP to prescribe escitalopram, I had agreed to prescribe it for 30 days with 2 refills. That would have allowed him to find a psychiatrist or PCP. Unfortunately, he is not interested in finding another psychiatrist, so I have continued to prescribe the escitalopram for him.   10. Hypercholesterolemia: His cholesterol levels were elevated in August 2019 and again in August 2020. Exercise and more consistent BG control would help.  11. Noncompliance: He is not doing as well now as he was 6-12 months ago. He is consistently inconsistent with his DM care. I continue to hope that as Hanif ages and matures, he will do a better job of taking care of his T1DM and his life.     PLAN:  1. Diagnostic: I reviewed his BG printout and his recent annual fasting lab results.    2. Therapeutic: Continue Synthroid, 25 mcg/day, escitalopram, 10 mg/ day, and lisinopril 2.5 mg/day. Give both correction boluses and food boluses. Continue his current basal rates:  MN: 0.800 5 AM: 0.800 8 AM: 0.650 Noon: 0.900 6 PM: 1.600 9 PM: 1.300 3. Patient education: Evren is a very intelligent young man who knows intellectually what he should do and how to do it. Unfortunately, he continues to lack the commitment to be as consistent with his T1DM care as he needs to be. If he will start using the Guardian 3 sensor and take advantage of all the benefits of the Medtronic 670G pump and sensor combination, then his BG control can be much better. 4. Follow-up: 3 months with me  Level of Service: This visit lasted in excess of 55  minutes. More than 50% of the  visit was devoted to counseling.  [name] Sers, MD, CDE Adult and Pediatric Endocrinology 08/13/2019 2:03 PM

## 2019-08-13 NOTE — Patient Instructions (Signed)
Follow up visit in 3 months. 

## 2019-08-25 ENCOUNTER — Other Ambulatory Visit (INDEPENDENT_AMBULATORY_CARE_PROVIDER_SITE_OTHER): Payer: Self-pay | Admitting: "Endocrinology

## 2019-08-25 DIAGNOSIS — E038 Other specified hypothyroidism: Secondary | ICD-10-CM

## 2019-09-28 ENCOUNTER — Other Ambulatory Visit (INDEPENDENT_AMBULATORY_CARE_PROVIDER_SITE_OTHER): Payer: Self-pay | Admitting: "Endocrinology

## 2019-11-12 ENCOUNTER — Ambulatory Visit (INDEPENDENT_AMBULATORY_CARE_PROVIDER_SITE_OTHER): Payer: BC Managed Care – PPO | Admitting: "Endocrinology

## 2019-11-12 ENCOUNTER — Encounter (INDEPENDENT_AMBULATORY_CARE_PROVIDER_SITE_OTHER): Payer: Self-pay | Admitting: "Endocrinology

## 2019-11-12 ENCOUNTER — Other Ambulatory Visit: Payer: Self-pay

## 2019-11-12 VITALS — BP 102/74 | HR 80 | Wt 152.4 lb

## 2019-11-12 DIAGNOSIS — E1043 Type 1 diabetes mellitus with diabetic autonomic (poly)neuropathy: Secondary | ICD-10-CM

## 2019-11-12 DIAGNOSIS — E1065 Type 1 diabetes mellitus with hyperglycemia: Secondary | ICD-10-CM

## 2019-11-12 DIAGNOSIS — E049 Nontoxic goiter, unspecified: Secondary | ICD-10-CM | POA: Diagnosis not present

## 2019-11-12 DIAGNOSIS — E10649 Type 1 diabetes mellitus with hypoglycemia without coma: Secondary | ICD-10-CM

## 2019-11-12 DIAGNOSIS — E063 Autoimmune thyroiditis: Secondary | ICD-10-CM

## 2019-11-12 DIAGNOSIS — R000 Tachycardia, unspecified: Secondary | ICD-10-CM

## 2019-11-12 DIAGNOSIS — E1042 Type 1 diabetes mellitus with diabetic polyneuropathy: Secondary | ICD-10-CM

## 2019-11-12 LAB — POCT GLYCOSYLATED HEMOGLOBIN (HGB A1C): Hemoglobin A1C: 7.5 % — AB (ref 4.0–5.6)

## 2019-11-12 LAB — POCT GLUCOSE (DEVICE FOR HOME USE): POC Glucose: 222 mg/dl — AB (ref 70–99)

## 2019-11-12 NOTE — Progress Notes (Signed)
Subjective:  Patient Name: Scott Moran Date of Birth: 11-Feb-1991  MRN: [id number]  Scott Moran  presents to the office today for follow-up of his type 1 diabetes mellitus, goiter, hypoglycemia, depression, social anxiety disorder, avoidant personality disorder, hypothyroidism, thyroiditis, diabetic autonomic neuropathy, inappropriate sinus tachycardia, non-proliferative diabetic retinopathy, diabetic peripheral neuropathy, fatigue, hypogonadotropic hypogonadism, and medical non-compliance.  HISTORY OF PRESENT ILLNESS:   Scott Moran is a 28 y.o. Caucasian young man. Scott Moran was unaccompanied.   1. I first saw the patient in consultation on 03/27/05 in the [facility] in [place], [place]. He had been referred by his primary care provider, Dr. Wendie Moran, in [place], [place], for evaluation and treatment of poorly controlled type 1 diabetes. The patient was 28 years old.  A. The patient had been diagnosed with type 1 diabetes in February of 1995 at age 64. He was on an insulin pump when I first met him. His hemoglobin A1c had increased from 7.1% in December of 2005 to 8.2% in April 2006. He was having frequent snacks that he was not taking boluses for. He was also frequently noncompliant with checking blood sugars and taking meal boluses. I adjusted his basal rates.  B. At his next [place] visit on 06/19/05, he was still very noncompliant. His mental status was normal. He had a 25 gram goiter. His father was serving in [place] with the [employer]. There was a lot of stress at home. I again adjusted his basal rates.  C. At the end of July 2006 we closed our satellite office in [place]. The patient elected to be followed at our clinic here in [place]. I have followed him here ever since.  2. During the past fourteen years, the patient has had several clinical issues.   A. T1DM:    1). The patient's blood glucose control has usually been fairly good  for a teenager/young adult. His maximum hemoglobin A1c occurred on 04/16/06 when the hemoglobin A1c was 12.1%. Since then the hemoglobin A1c values have varied between 6.9% and 8.6%, until his HbA1c on 10/30/17 of 9.0%. Since then his HbA1c has varied from 7.4% to 8.4%.    2). He is still often noncompliant with checking blood sugars, bolusing, and changing his insulin pump sites on time, but he is more compliant and more careful than he used to be. He has never required hospital re-admission for DKA, severe hyperglycemia, or hypoglycemia.   3). He started his new Medtronic 670G insulin pump and Guardian 3 sensor in September 2017, but later stopped using the sensor and has refused to resume using it.    B. Goiter, Hashimoto's thyroiditis, and hypothyroidism: In late 2008 and early 2009 the patient developed hypothyroidism secondary to Hashimoto's disease. I started him on Synthroid, 25 mcg per day. When he takes his medication, he is euthyroid.  C. Autonomic neuropathy and inappropriate sinus tachycardia: In late 2008 he developed autonomic neuropathy which manifested as inappropriate sinus tachycardia. These problems have varied in parallel with his BGs and HbA1c values.  D. Diabetic peripheral neuropathy: The patient has had symptoms and signs of mild diabetic peripheral neuropathy at several times in the past. His peripheral neuropathic signs and symptoms have similarly varied in parallel with his BGs and HbA1c values.   E. Non-proliferative diabetic retinopathy: This problem was identified during a diabetic eye exam in June 2010.  F. Hypogonadotropic hypogonadism: The patient has always been somewhat lackadaisical and relatively passive. On 06/29/10 his [place] was 4.9, LH  was 2.4, testosterone was 399.66, and free testosterone was 79.8. The testosterone and free testosterone were relatively low for an 28 year old young man. At the same time the [facility] and LH while "normal", appeared to be actually  inappropriately low for the level of testosterone he had. We discussed the options for treatment including exercise and medication, to include AndroGel. The patient chose the exercise option. By 10/19/10 the [facility] was 6.8, the LH was 3.2, total testosterone was 615.88 and the free testosterone was 131.6. In retrospect, when he was more depressed he may have had less gonadotropic function.   G. Depression/Anxiety/Personality disorders:    1). The patient has been diagnosed with depression at different times in the past. For some time he took Remeron (mirtazapine), but discontinued that medication in early 2012. I had been trying to convince him for some time that he was depressed and that he should seek out help. He eventually did decide to seek psychiatric help.    2). On 06/08/13 [place] was evaluated by [place], a noted psychiatrist in [place].  [place] and his staff performed neuropsychiatric testing. On the [place] had problems with non-verbal reasoning and emotional intelligence. [place] diagnosed Scott [place] with Social Anxiety Disorder and Avoidant Personality Disorder. Scott [place] prescribed escitalopram, 10 mg/day. Scott [place] was supposed to continue to see [place] in follow up. Unfortunately, [name] did not follow up.    3). [place] was then referred to a therapist, [place]. [place] enjoyed working with [place] in the past and felt that he had made progress over time. Unfortunately, [place] has not been back to see Scott [place] for more than one year.   H. 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   3. The patient's last [facility] visit was on 08/13/19. At that visit I continued his Medtronic 670G insulin pump settings and his Synthroid, escitalopram, and lisinopril doses.   A. In the interim he has been healthy.    B. He is again  taking on-line courses through [place]. He will soon complete his two-year program.   C. He is still not using his Guardian 3 sensor. He is thinking about trying an upgrade from Medtronic.   Juanda Bond has not seen [place], his therapist, due to conflicts with his academic schedule and lack of interest.   E. Kionte takes his Synthroid dose of 25 mcg/day every night. He takes escitalopram 10 mg and  lisinopril 2.5 mg daily.    F. He has his learner's permit, but has not wanted to try driving. He is severely afraid of other drivers that are "terrible".   4. Pertinent Review of Systems:  Constitutional: Caellum feels "pretty great". His life is going "pretty well" overall. His allergies are not acting up much. He is pleased with the presidential election results.   Eyes: Vision is good with his glasses. There are no significant eye complaints. Last eye exam was in late October or early November 2019. There were no signs of diabetic retinopathy. He will set up an appointment soon.  Mouth: No problems.  Neck: The patient has no complaints of anterior neck swelling, soreness, tenderness,  pressure, discomfort, or difficulty swallowing.  Heart: Heart rate increases with exercise or other physical activity. The patient has no complaints of palpitations, irregular heat beats, chest pain, or chest pressure. Gastrointestinal: No postprandial bloating. Bowel  movents seem normal. The patient has no other complaints of excessive hunger, acid reflux, upset stomach, stomach aches or pains, diarrhea, or constipation. Legs: Muscle mass and strength seem normal. There are no complaints of numbness, tingling, burning, or pain. No edema is noted. Feet: There are no obvious foot problems. There are no complaints of numbness, tingling, burning, or pain. No edema is noted. Hypoglycemia: He has had "a few" low BGs recently.  Emotional/psychological: The patient feels "pretty optimistic about the future of the  country".  Diabetes identification: He has his pump on and a wallet card.  .  5. BG printout: We have data for the past 2 weeks. He is changing his sites every 7+ days. He is checking BGs 1-6 times per day, for an average of 2.7 times per day, compared with 1.9 times per day at his last visit. He boluses 1-9 times per day, but many of the boluses are only food boluses. Average BG is 238, compared with 219 at his last visit and with 222 at his prior visit. BG range is 58 - >400, compared with 45 - >400 at his last visit and with 48 - >400 at his prior visit. All of his BGs >400 occurred when his sites were left in too long. The  longer he leaves sites in, the longer he goes between BG checks and insulin boluses, and the less frequently he takes correction boluses, the more variable his BGs have been. He had 6 BGs <80 in the past 2 weeks, 5 of which occurred in the mornings.      PAST MEDICAL, FAMILY, AND SOCIAL HISTORY:  Past Medical History:  Diagnosis Date  . DM type 1 with diabetic peripheral neuropathy ([place])   . Goiter   . Hypertension   . Hypoglycemia associated with diabetes ([place])   . Hypogonadotropic hypogonadism ([place])   . Hypothyroidism, acquired, autoimmune   . Tachycardia   . Thyroiditis, autoimmune   . Type 1 diabetes mellitus not at goal [facility])   . Type 1 diabetes mellitus with diabetic autonomic neuropathy ([facility])     Family History  Problem Relation Age of Onset  . Diabetes Maternal Uncle   . Hypertension Maternal Grandmother   . Thyroid disease Neg Hx   . Obesity Neg Hx   . Kidney disease Neg Hx   . Cancer Neg Hx   . Anemia Neg Hx      Current Outpatient Medications:  .  acetone, urine, test strip, Check ketones per protocol, Disp: 50 each, Rfl: 3 .  escitalopram (LEXAPRO) 10 MG tablet, TAKE 1 TABLET BY MOUTH ONCE DAILY, Disp: 30 tablet, Rfl: 4 .  glucagon (GLUCAGON EMERGENCY) 1 MG injection, Inject 1 mg intramuscularly in thigh 1 time for severe hypoglycemia if  unresponsive, unconscious, unable to swallow or has a seizure., Disp: 2 kit, Rfl: 3 .  glucose blood (BAYER CONTOUR NEXT TEST) test strip, Check sugar 10 x daily, Disp: 300 each, Rfl: 3 .  insulin aspart (NOVOLOG) 100 UNIT/ML injection, INJECT 300 UNITS INTO PUMP EVERY 48-72 HOURS AND PER PROTOCOL FOR HYPERGLYCEMIA, Disp: 30 mL, Rfl: 5 .  levothyroxine (SYNTHROID) 25 MCG tablet, TAKE 1 TABLET BY MOUTH DAILY BEFORE BREAKFAST, Disp: 30 tablet, Rfl: 5 .  lisinopril (ZESTRIL) 2.5 MG tablet, TAKE 1 TABLET BY MOUTH ONCE DAILY, Disp: 30 tablet, Rfl: 11 .  insulin lispro (HUMALOG) 100 UNIT/ML injection, Up to 300 units of insulin in insulin pump every 48-72 hours pre DKA and Hyperglycemia protocols (Patient  not taking: Reported on 08/13/2019), Disp: 40 mL, Rfl: 5 .  ondansetron (ZOFRAN ODT) 4 MG disintegrating tablet, Take 1 tablet (4 mg total) by mouth every 8 (eight) hours as needed for nausea or vomiting. (Patient not taking: Reported on 08/13/2019), Disp: 20 tablet, Rfl: 0 .  promethazine (PHENERGAN) 25 MG tablet, Take 1 tablet (25 mg total) by mouth every 6 (six) hours as needed for nausea or vomiting. (Patient not taking: Reported on 08/13/2019), Disp: 20 tablet, Rfl: 0  Allergies as of 11/12/2019  . (No Known Allergies)    1. Work and Family: Norma is again taking on-line courses at [employer]  He is doing well and has almost finished his two-year program. 2. Activities: Few physical activities. He is a history and politics buff. He frequently participates in online forum discussions about historical and political topics. He is thinking about becoming a history Pharmacist, hospital.  3. Smoking, alcohol, or drugs: None 4. Primary Care Provider: He does not have a current primary care provider.  5. Psychiatry: He no longer has a psychiatrist.  6. Psychologist: He has not seen [place] for quite some time.   REVIEW OF SYSTEMS: There are no other significant problems involving Cinch's other body systems.    Objective:  Vital Signs:  BP 102/74   Pulse 80   Wt 152 lb 6.4 oz (69.1 kg)   BMI 23.78 kg/m     Ht Readings from Last 3 Encounters:  08/13/19 5' 7.13" (1.705 m)  01/27/19 5' 7.17" (1.706 m)  10/27/18 5' 7.24" (1.708 m)   Wt Readings from Last 3 Encounters:  11/12/19 152 lb 6.4 oz (69.1 kg)  08/13/19 153 lb 12.8 oz (69.8 kg)  04/28/19 151 lb 3.2 oz (68.6 kg)   PHYSICAL EXAM: Constitutional: Tyrae was alert and cognitively normal today. His weight has decreased 1.5 pounds since last visit. His affect was upbeat and he was again very talkative. His insight was good today. He has been more consistent in his DM care since his last visit.  Face: The face appears normal.  Eyes: There is no obvious arcus or proptosis. Moisture appears normal. Mouth: The oropharynx and tongue appear normal. Oral moisture is normal. Neck: The neck looks normal. No carotid bruits are noted. The thyroid gland has shrunk back to top-normal size of 20 grams in size today. Both lobes are normal in size today. The consistency of the thyroid gland is normal today. The thyroid gland is not tender to palpation.  Lungs: The lungs are clear to auscultation. Air movement is good. Heart: Heart rate remains borderline tachycardic. Heart rhythm is regular. Heart sounds S1 and S2 are normal. I did not appreciate any pathologic cardiac murmurs. Abdomen: The abdomen is mildly enlarged. Bowel sounds are normal. There is no obvious hepatomegaly, splenomegaly, or other mass effect.  Arms: Muscle size and bulk are normal for age. Hands: There is no obvious tremor. Phalangeal and metacarpophalangeal joints are normal. Palmar muscles are normal. Palmar skin is normal. Palmar moisture is also normal. Legs: Muscles appear normal for age. No edema is present. Feet: Feet are normally formed. Dorsalis pedal pulses are faint 1+ bilaterally.   Neurologic: Strength is normal for age in both the upper and lower extremities. Muscle tone is  normal. Sensation to touch is normal in both legs, but decreased in both heels.      LAB DATA:   Results for orders placed or performed in visit on 11/12/19 (from the past 504 hour(s))  POCT  Glucose (Device for Home Use)   Collection Time: 11/12/19  1:28 PM  Result Value Ref Range   Glucose Fasting, POC     POC Glucose 222 (A) 70 - 99 mg/dl  POCT HgB A1C   Collection Time: 11/12/19  1:30 PM  Result Value Ref Range   Hemoglobin A1C 7.5 (A) 4.0 - 5.6 %   HbA1c POC (<> result, manual entry)     HbA1c, POC (prediabetic range)     HbA1c, POC (controlled diabetic range)     Labs 11/12/19: HbA1c 7.5%, CBG 222; HbA1c is lower, but at the cost of too many low BGs.  Labs 08/13/19: HbA1c 8.7%, CBG 264  Labs 07/15/19: TSH 1.90, free T4 1.2, free T3 2.8; CMP normal, except glucose 180; CBC normal; cholesterol 177, triglycerides 65, HDL 56, LDL 106; microalbumin/creatinine ratio 3; LH 3.5, FSH 7.7, testosterone 495 (ref 250-827), free testosterone 68.1 (ref 46-224)  Labs 04/28/19: HbA1c 7.7%, CBG 394; U/A positive glucose and moderate ketones  Labs 01/27/19: HbA1c 7.4%, CBG 231  Labs 10/27/18: HbA1c 8.0%, CBG 250  Labs 07/21/18: HbA1c 8.2%, CBG 166  Labs 07/15/18: TSH 1.54, free T4 1.2, free T3 3.5; CMP normal; cholesterol 176, triglycerides 74, HDL 53, LDL 107; urinary microalbumin/creatinine ratio 6  Labs 04/21/18: HbA1c 8.2%, CBG 295  Labs 01/20/18: HbA1c 8.4%, CBG 206  Labs 10/30/17: HbA1c 9.0%, CBG 147  Labs 08/13/17: CBG 354  Labs 06/04/17: CBG 173; BMP normal  Labs 05/29/17: HbA1c 8.2%; TSH 1.84, free T4 1.2, free T3 3.4, CMP normal except for glucose 310 and potassium 5.8; LH 3.7, FSH 5.9, testosterone 634, free testosterone 76 (ref 46-224), estradiol 31; microalbumin/creatinine ratio 4; cholesterol 187, triglycerides 63, HDL 59, LDL 116  Labs 04/03/17: CBG 182  Labs 01/31/17: HbA1c 7.6%, CBG 341  Labs 10/30/16: HbA1c 7.5%  Labs 07/25/16: HbA1c 7.8%  Labs 04/25/16: HbA1c 7.7%  Labs  04/09/16: LH 3.4, FSH 6.0, testosterone 504, free testosterone 85.5 (ref 47-244); cholesterol 176, triglycerides 92, HDL 80, LDL 88; CMP norma except for glucose 224; TSH 1.75, free T4 1.2, free T3 3.2; microalbumin/creatinine ratio 4  Labs 01/24/16: HbA1c 8.6%  Labs 10/20/15: HbA1c 8.1%  Labs 09/15/15: BMP normal except for glucose of 143 and calcium 8.3.  Labs 07/20/15: HbA1c 8.2%  Labs 03/25/15: HbA1c 7.8%  Labs 12/02/14: HbA1c 8.5%;  LH 4.0, FSH 8.2, testosterone 621.97; CMP normal; TSH 2.01, free T4 1.21, free T3 3.6; cholesterol 155, triglycerides 46, HDL  55, LDL 91; microalbumin/creatinine ratio 6.6  Labs 03/30/14: TSH 1.935, free T4 1.12, free T3 3.2; CMP: normal except for glucose 203; testosterone 624, FSH 6.3, LH 2.4; urine microalbumin/creatinine ratio 6.4; cholesterol 163, triglycerides 55, HDL 64, LDL 80  Labs 08/23/13: TSH 1.139; CMP normal except for a glucose of 145; CBC: WBC 5.4, Hgb 12.7, Hct 34.7%   Assessment and Plan:   ASSESSMENT:  1-2. Type 1 diabetes mellitus/hypoglycemia:  A. Marvie's HbA1c has decreased to 7.5%. He appears to be having many more lower BGs than he recognizes.    B. His is doing worse at changing sites. He is doing better at checking BGs and bolusing.   C. He had 6 documented low BGs in the past four weeks, mostly in the mornings due to the basal rates at that time being too high.   3. Autonomic neuropathy and inappropriate sinus tachycardia: These problems are still active, but better.  4. Peripheral neuropathy: This problem is again evident today.    5. Hypothyroid: He  was euthyroid in December 2015, in May 2017, in June 2018, in August 2019, and in August 2020 on his current dose of Synthroid.  6. Hypogonadotropic hypogonadism: His testosterone level in May 2017 was acceptable, but was lower than in December 2015. His testosterone level in June 2018 was good, but his free testosterone was relatively low for his age.  His recent testosterone in  August 2020 was normal, but relatively low for his age.   7. Hypertension: His BP is normal today. He needs to take his lisinopril and exercise regularly.  8. Goiter/Hashimoto's disease: His goiter is top-normal size today. The pattern of waxing and waning of thyroid gland size is c/w evolving thyroiditis. The thyroiditis is clinically quiescent 9. Depression: This problem is much better today.  He says that he is taking the escitalopram reliably. Going back to school has also been a major positive factor for him. He does not exhibit any suicidal thoughts or emotions. I would like him to continue to see [place] and to re-establish care with a psychiatrist. He also needs to take his escitalopram. Since he does not have a psychiatrist or a PCP to prescribe escitalopram, I have agreed to prescribe it for him until he can find a new psychiatrist or PCP. Unfortunately, he is not interested in finding another PCP or psychiatrist, so I have continued to prescribe the escitalopram for him.   10. Hypercholesterolemia: His cholesterol levels were elevated in August 2019 and again in August 2020. Exercise and more consistent BG control would help.  11. Noncompliance: He is doing better than he used to do. He is still fairly inconsistent with his DM care. I continue to hope that as Delia ages and matures, he will do a better job of taking care of his T1DM and his life.     PLAN:  1. Diagnostic: I reviewed his BG printout and his recent annual fasting lab results.    2. Therapeutic: Continue Synthroid, 25 mcg/day, escitalopram, 10 mg/ day, and lisinopril 2.5 mg/day. Give both correction boluses and food boluses. We reduced his current basal rates from midnight to 8 AM:  MN: 0.800 -> 0.70 5 AM: 0.800 -> 0.70 8 AM: 0.650 Noon: 0.900 6 PM: 1.600 9 PM: 1.300 3. Patient education: Prayan is a very intelligent young man who knows intellectually what he should do and how to do it. Unfortunately, he continues to  lack the commitment to be as consistent with his T1DM care as he needs to be. If he will start using the Guardian 3 sensor and take advantage of all the benefits of the Medtronic 670G pump and sensor combination, then his BG control can be much better. 4. Follow-up: 3 months with me  Level of Service: This visit lasted in excess of 55  minutes. More than 50% of the visit was devoted to counseling.  [name] Sers, MD, CDE Adult and Pediatric Endocrinology 11/12/2019 1:43 PM

## 2019-11-12 NOTE — Patient Instructions (Addendum)
Follow up visit in 3 months. 

## 2019-11-28 ENCOUNTER — Other Ambulatory Visit (INDEPENDENT_AMBULATORY_CARE_PROVIDER_SITE_OTHER): Payer: Self-pay | Admitting: "Endocrinology

## 2019-11-28 DIAGNOSIS — F3289 Other specified depressive episodes: Secondary | ICD-10-CM

## 2020-02-01 ENCOUNTER — Telehealth (INDEPENDENT_AMBULATORY_CARE_PROVIDER_SITE_OTHER): Payer: Self-pay | Admitting: "Endocrinology

## 2020-02-01 NOTE — Telephone Encounter (Signed)
  Who's calling (name and relationship to patient) : [facility] - [place] - [address] Best contact number: [phone] Provider they see: Fransico Everet Reason for call: [facility] has a question regarding Jashua's Synthroid.  Please call   PRESCRIPTION REFILL ONLY  Name of prescription:  Pharmacy:

## 2020-02-02 ENCOUNTER — Other Ambulatory Visit (INDEPENDENT_AMBULATORY_CARE_PROVIDER_SITE_OTHER): Payer: Self-pay | Admitting: *Deleted

## 2020-02-02 DIAGNOSIS — E038 Other specified hypothyroidism: Secondary | ICD-10-CM

## 2020-02-02 NOTE — Telephone Encounter (Signed)
Called regarding the If oj to take generic. Medication has been sent in with generic name.

## 2020-02-02 NOTE — Telephone Encounter (Signed)
Courtney from [facility] called to follow up on previous encounter regarding Everard's Synthroid. States that it is important they are contacted back today  [place] [phone]

## 2020-02-15 ENCOUNTER — Ambulatory Visit (INDEPENDENT_AMBULATORY_CARE_PROVIDER_SITE_OTHER): Payer: BC Managed Care – PPO | Admitting: "Endocrinology

## 2020-03-02 ENCOUNTER — Other Ambulatory Visit (INDEPENDENT_AMBULATORY_CARE_PROVIDER_SITE_OTHER): Payer: Self-pay | Admitting: "Endocrinology

## 2020-03-02 DIAGNOSIS — E038 Other specified hypothyroidism: Secondary | ICD-10-CM

## 2020-03-03 ENCOUNTER — Telehealth (INDEPENDENT_AMBULATORY_CARE_PROVIDER_SITE_OTHER): Payer: Self-pay | Admitting: "Endocrinology

## 2020-03-03 NOTE — Telephone Encounter (Signed)
Spoke to [place] and father, Dr. Fransico Lamari is out, I spoke to our Research officer, political party and she advises that Dr. Fransico Yadier is for the vaccine. They advise they will reach out and find a clinic.

## 2020-03-03 NOTE — Telephone Encounter (Signed)
  Who's calling (name and relationship to patient) : Beth Oberry mom   Best contact number: [phone]  Provider they see: Dr. Fransico Elester  Reason for call: Mom wants to know if it would be a good idea for Krishawn Quakenbush to get the COVID Vaccine and if so what type of shot to get    PRESCRIPTION REFILL ONLY  Name of prescription:  Pharmacy:

## 2020-03-16 ENCOUNTER — Other Ambulatory Visit: Payer: Self-pay

## 2020-03-16 ENCOUNTER — Ambulatory Visit (INDEPENDENT_AMBULATORY_CARE_PROVIDER_SITE_OTHER): Payer: BC Managed Care – PPO | Admitting: "Endocrinology

## 2020-03-16 ENCOUNTER — Encounter (INDEPENDENT_AMBULATORY_CARE_PROVIDER_SITE_OTHER): Payer: Self-pay | Admitting: "Endocrinology

## 2020-03-16 VITALS — BP 122/74 | HR 86 | Wt 151.8 lb

## 2020-03-16 DIAGNOSIS — E063 Autoimmune thyroiditis: Secondary | ICD-10-CM

## 2020-03-16 DIAGNOSIS — E1043 Type 1 diabetes mellitus with diabetic autonomic (poly)neuropathy: Secondary | ICD-10-CM

## 2020-03-16 DIAGNOSIS — E1042 Type 1 diabetes mellitus with diabetic polyneuropathy: Secondary | ICD-10-CM | POA: Diagnosis not present

## 2020-03-16 DIAGNOSIS — E10649 Type 1 diabetes mellitus with hypoglycemia without coma: Secondary | ICD-10-CM | POA: Diagnosis not present

## 2020-03-16 DIAGNOSIS — Z91199 Patient's noncompliance with other medical treatment and regimen due to unspecified reason: Secondary | ICD-10-CM

## 2020-03-16 DIAGNOSIS — E291 Testicular hypofunction: Secondary | ICD-10-CM

## 2020-03-16 DIAGNOSIS — Z9119 Patient's noncompliance with other medical treatment and regimen: Secondary | ICD-10-CM

## 2020-03-16 DIAGNOSIS — E7800 Pure hypercholesterolemia, unspecified: Secondary | ICD-10-CM

## 2020-03-16 DIAGNOSIS — E1065 Type 1 diabetes mellitus with hyperglycemia: Secondary | ICD-10-CM | POA: Diagnosis not present

## 2020-03-16 DIAGNOSIS — I10 Essential (primary) hypertension: Secondary | ICD-10-CM

## 2020-03-16 DIAGNOSIS — E049 Nontoxic goiter, unspecified: Secondary | ICD-10-CM

## 2020-03-16 LAB — POCT GLYCOSYLATED HEMOGLOBIN (HGB A1C): Hemoglobin A1C: 8.9 % — AB (ref 4.0–5.6)

## 2020-03-16 LAB — POCT GLUCOSE (DEVICE FOR HOME USE): POC Glucose: 142 mg/dl — AB (ref 70–99)

## 2020-03-16 NOTE — Patient Instructions (Signed)
Follow up visit in 3 months. Please repeat fasting lab tests 1-2 weeks prior.  

## 2020-03-16 NOTE — Progress Notes (Signed)
Subjective:  Patient Name: Scott Moran Date of Birth: 11-Feb-1991  MRN: [id number]  Scott Moran  presents to the office today for follow-up of his type 1 diabetes mellitus, goiter, hypoglycemia, depression, social anxiety disorder, avoidant personality disorder, hypothyroidism, thyroiditis, diabetic autonomic neuropathy, inappropriate sinus tachycardia, non-proliferative diabetic retinopathy, diabetic peripheral neuropathy, fatigue, hypogonadotropic hypogonadism, and medical non-compliance.  HISTORY OF PRESENT ILLNESS:   Scott Moran is a 29 y.o. Caucasian young man. Scott Moran was unaccompanied.   1. I first saw the patient in consultation on 03/27/05 in the [facility] in [place], [place]. He had been referred by his primary care provider, Dr. Wendie Moran, in [place], [place], for evaluation and treatment of poorly controlled type 1 diabetes. The patient was 29 years old.  A. The patient had been diagnosed with type 1 diabetes in February of 1995 at age 64. He was on an insulin pump when I first met him. His hemoglobin A1c had increased from 7.1% in December of 2005 to 8.2% in April 2006. He was having frequent snacks that he was not taking boluses for. He was also frequently noncompliant with checking blood sugars and taking meal boluses. I adjusted his basal rates.  B. At his next [place] visit on 06/19/05, he was still very noncompliant. His mental status was normal. He had a 25 gram goiter. His father was serving in [place] with the [employer]. There was a lot of stress at home. I again adjusted his basal rates.  C. At the end of July 2006 we closed our satellite office in [place]. The patient elected to be followed at our clinic here in [place]. I have followed him here ever since.  2. During the past fourteen years, the patient has had several clinical issues.   A. T1DM:    1). The patient's blood glucose control has usually been fairly good  for a teenager/young adult. His maximum hemoglobin A1c occurred on 04/16/06 when the hemoglobin A1c was 12.1%. Since then the hemoglobin A1c values have varied between 6.9% and 8.6%, until his HbA1c on 10/30/17 of 9.0%. Since then his HbA1c has varied from 7.4% to 8.4%.    2). He is still often noncompliant with checking blood sugars, bolusing, and changing his insulin pump sites on time, but he is more compliant and more careful than he used to be. He has never required hospital re-admission for DKA, severe hyperglycemia, or hypoglycemia.   3). He started his new Medtronic 670G insulin pump and Guardian 3 sensor in September 2017, but later stopped using the sensor and has refused to resume using it.    B. Goiter, Hashimoto's thyroiditis, and hypothyroidism: In late 2008 and early 2009 the patient developed hypothyroidism secondary to Hashimoto's disease. I started him on Synthroid, 25 mcg per day. When he takes his medication, he is euthyroid.  C. Autonomic neuropathy and inappropriate sinus tachycardia: In late 2008 he developed autonomic neuropathy which manifested as inappropriate sinus tachycardia. These problems have varied in parallel with his BGs and HbA1c values.  D. Diabetic peripheral neuropathy: The patient has had symptoms and signs of mild diabetic peripheral neuropathy at several times in the past. His peripheral neuropathic signs and symptoms have similarly varied in parallel with his BGs and HbA1c values.   E. Non-proliferative diabetic retinopathy: This problem was identified during a diabetic eye exam in June 2010.  F. Hypogonadotropic hypogonadism: The patient has always been somewhat lackadaisical and relatively passive. On 06/29/10 his [place] was 4.9, LH  was 2.4, testosterone was 399.66, and free testosterone was 79.8. The testosterone and free testosterone were relatively low for an 29 year old young man. At the same time the [facility] and LH while "normal", appeared to be actually  inappropriately low for the level of testosterone he had. We discussed the options for treatment including exercise and medication, to include AndroGel. The patient chose the exercise option. By 10/19/10 the [facility] was 6.8, the LH was 3.2, total testosterone was 615.88 and the free testosterone was 131.6. In retrospect, when he was more depressed he may have had less gonadotropic function.   G. Depression/Anxiety/Personality disorders:    1). The patient has been diagnosed with depression at different times in the past. For some time he took Remeron (mirtazapine), but discontinued that medication in early 2012. I had been trying to convince him for some time that he was depressed and that he should seek out help. He eventually did decide to seek psychiatric help.    2). On 06/08/13 Scott Moran was evaluated by Dr. Carlena Moran, a noted psychiatrist in [place].  Dr. [name] and his staff performed neuropsychiatric testing. On the [facility]LUWADARASIMI [place] had problems with non-verbal reasoning and emotional intelligence. Dr. 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 Dr. [name] prescribed escitalopram, 10 mg/day. Scott Moran was supposed to continue to see Dr. [name] in follow up. Unfortunately, Scott Moran did not follow up.    3). Scott Moran was then referred to a therapist, Dr. Celso Moran. Scott Moran enjoyed working with Dr. [name] in the past and felt that he had made progress over time. Unfortunately, Scott Moran has not been back to see Dr. [name] for more than one year.   H. 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   3. The patient's last [facility] visit was on 11/12/19. At that visit I reduced his basal rates from MN-8 AM. Those changes helped to reduce nocturnal hypoglycemia. I continued his Synthroid, escitalopram, and lisinopril doses.   A. In  the interim he has been healthy.  He had his first [place] covid vaccination recently.   B. He is not taking on-line courses through [employer] this semester. He will complete his two-year program in the future.   C. He is still not using his Guardian 3 sensor. He is thinking about trying an upgrade his Medtronic 670G pump.   Scott Moran has not seen Dr. [name], his therapist, due to conflicts with his academic schedule and lack of interest.   E. Scott Moran takes his Synthroid dose of 25 mcg/day every night. He takes escitalopram 10 mg and  lisinopril 2.5 mg daily.    F. He has his learner's permit, but has not wanted to try driving. He is severely afraid of other drivers that are "terrible".   4. Pertinent Review of Systems:  Constitutional: Scott Moran feels "pretty good". His life is going "pretty good" overall. His allergies are not acting up much.  Eyes: Vision is good with his glasses. There are no significant eye complaints. Last eye exam was in late October or early November 2019. There were no signs of diabetic retinopathy. He has an appointment in July.   Mouth: No problems.  Neck: Scott Moran has no complaints of anterior neck swelling, soreness, tenderness,  pressure, discomfort, or difficulty swallowing.  Heart: Heart rate increases with exercise or other physical activity. The patient has no complaints of palpitations, irregular  heat beats, chest pain, or chest pressure. Gastrointestinal: No postprandial bloating. Bowel movents seem normal. The patient has no other complaints of excessive hunger, acid reflux, upset stomach, stomach aches or pains, diarrhea, or constipation. Legs: Muscle mass and strength seem normal. There are no complaints of numbness, tingling, burning, or pain. No edema is noted. Feet: There are no obvious foot problems. There are no complaints of numbness, tingling, burning, or pain. No edema is noted. Hypoglycemia: He has had "some" low BGs recently.  Emotional/psychological: The  patient feels "pretty good".  Diabetes identification: He has his pump and a wallet card.  .  5. BG printout: We have data for the past 2 weeks. He is changing his sites every 8+ days. He is checking BGs 0-3 times per day, for an average of 1.1 times per day, compared with 2.7 times per day at his last visit. He bolused 1-7 times per day, but many of the boluses were only food boluses. Average BG is 199, compared with 238 at his last visit and with 219 at his prior visit. BG range is 101-292, compared with 58 - >400 at his last visit and with 45 - >400 at his prior visit.     PAST MEDICAL, FAMILY, AND SOCIAL HISTORY:  Past Medical History:  Diagnosis Date  . DM type 1 with diabetic peripheral neuropathy ([place])   . Goiter   . Hypertension   . Hypoglycemia associated with diabetes ([place])   . Hypogonadotropic hypogonadism ([place])   . Hypothyroidism, acquired, autoimmune   . Tachycardia   . Thyroiditis, autoimmune   . Type 1 diabetes mellitus not at goal [facility])   . Type 1 diabetes mellitus with diabetic autonomic neuropathy ([facility])     Family History  Problem Relation Age of Onset  . Diabetes Maternal Uncle   . Hypertension Maternal Grandmother   . Thyroid disease Neg Hx   . Obesity Neg Hx   . Kidney disease Neg Hx   . Cancer Neg Hx   . Anemia Neg Hx      Current Outpatient Medications:  .  escitalopram (LEXAPRO) 10 MG tablet, TAKE 1 TABLET BY MOUTH ONCE DAILY, Disp: 30 tablet, Rfl: 4 .  glucagon (GLUCAGON EMERGENCY) 1 MG injection, Inject 1 mg intramuscularly in thigh 1 time for severe hypoglycemia if unresponsive, unconscious, unable to swallow or has a seizure., Disp: 2 kit, Rfl: 3 .  glucose blood (BAYER CONTOUR NEXT TEST) test strip, Check sugar 10 x daily, Disp: 300 each, Rfl: 3 .  insulin aspart (NOVOLOG) 100 UNIT/ML injection, INJECT 300 UNITS INTO PUMP EVERY 48-72 HOURS AND PER PROTOCOL FOR HYPERGLYCEMIA, Disp: 30 mL, Rfl: 5 .  levothyroxine (SYNTHROID) 25 MCG tablet, TAKE 1  TABLET BY MOUTH DAILY BEFORE BREAKFAST, Disp: 30 tablet, Rfl: 5 .  lisinopril (ZESTRIL) 2.5 MG tablet, TAKE 1 TABLET BY MOUTH ONCE DAILY, Disp: 30 tablet, Rfl: 11 .  acetone, urine, test strip, Check ketones per protocol (Patient not taking: Reported on 03/16/2020), Disp: 50 each, Rfl: 3 .  insulin lispro (HUMALOG) 100 UNIT/ML injection, Up to 300 units of insulin in insulin pump every 48-72 hours pre DKA and Hyperglycemia protocols (Patient not taking: Reported on 08/13/2019), Disp: 40 mL, Rfl: 5 .  ondansetron (ZOFRAN ODT) 4 MG disintegrating tablet, Take 1 tablet (4 mg total) by mouth every 8 (eight) hours as needed for nausea or vomiting. (Patient not taking: Reported on 08/13/2019), Disp: 20 tablet, Rfl: 0 .  promethazine (PHENERGAN) 25 MG tablet, Take  1 tablet (25 mg total) by mouth every 6 (six) hours as needed for nausea or vomiting. (Patient not taking: Reported on 08/13/2019), Disp: 20 tablet, Rfl: 0  Allergies as of 03/16/2020  . (No Known Allergies)    1. Work and Family: [place] has to do more course work to complete his two-year program.  2. Activities: Few physical activities. He is a history and politics buff. He frequently participates in online forum discussions about historical and political topics. He is thinking about becoming a history Pharmacist, hospital.  3. Smoking, alcohol, or drugs: None 4. Primary Care Provider: He does not have a current primary care provider.  5. Psychiatry: He no longer has a psychiatrist.  6. Psychologist: He has not seen Dr. Celso Moran for quite some time.   REVIEW OF SYSTEMS: There are no other significant problems involving Scott Moran's other body systems.   Objective:  Vital Signs:  BP 122/74   Pulse 86   Wt 151 lb 12.8 oz (68.9 kg)   BMI 23.69 kg/m     Ht Readings from Last 3 Encounters:  08/13/19 5' 7.13" (1.705 m)  01/27/19 5' 7.17" (1.706 m)  10/27/18 5' 7.24" (1.708 m)   Wt Readings from Last 3 Encounters:  03/16/20 151 lb 12.8 oz (68.9 kg)   11/12/19 152 lb 6.4 oz (69.1 kg)  08/13/19 153 lb 12.8 oz (69.8 kg)   PHYSICAL EXAM: Constitutional: Scott Moran was alert and cognitively normal today. His weight has decreased 0.6 pounds since last visit. His affect was upbeat and he was again very socially talkative, but in a more adult fashion. His insight was good today. He has been less consistent in his DM care since his last visit.  Face: The face appears normal.  Eyes: There is no obvious arcus or proptosis. Moisture appears normal. Mouth: The oropharynx and tongue appear normal. Oral moisture is normal. Neck: The neck looks normal. No carotid bruits are noted. The thyroid gland has shrunk back to top-normal size of 20 grams in size today. Both lobes are normal in size today. The consistency of the thyroid gland is normal today. The thyroid gland is not tender to palpation.  Lungs: The lungs are clear to auscultation. Air movement is good. Heart: Heart rate remains borderline tachycardic. Heart rhythm is regular. Heart sounds S1 and S2 are normal. I did not appreciate any pathologic cardiac murmurs. Abdomen: The abdomen is mildly enlarged. Bowel sounds are normal. There is no obvious hepatomegaly, splenomegaly, or other mass effect.  Arms: Muscle size and bulk are normal for age. Hands: There is no obvious tremor. Phalangeal and metacarpophalangeal joints are normal. Palmar muscles are normal. Palmar skin is normal. Palmar moisture is also normal. Legs: Muscles appear normal for age. No edema is present. Feet: Feet are normally formed. Dorsalis pedal pulses are faint 1+ bilaterally.   Neurologic: Strength is normal for age in both the upper and lower extremities. Muscle tone is normal. Sensation to touch is normal in both legs, but decreased in both heels.      LAB DATA:   Results for orders placed or performed in visit on 03/16/20 (from the past 504 hour(s))  POCT Glucose (Device for Home Use)   Collection Time: 03/16/20  1:12 PM   Result Value Ref Range   Glucose Fasting, POC     POC Glucose 142 (A) 70 - 99 mg/dl  POCT glycosylated hemoglobin (Hb A1C)   Collection Time: 03/16/20  1:16 PM  Result Value Ref Range  Hemoglobin A1C 8.9 (A) 4.0 - 5.6 %   HbA1c POC (<> result, manual entry)     HbA1c, POC (prediabetic range)     HbA1c, POC (controlled diabetic range)     Labs 03/16/20: HbA1c 8.9%, CBG 142  Labs 11/12/19: HbA1c 7.5%, CBG 222; HbA1c is lower, but at the cost of too many low BGs.  Labs 08/13/19: HbA1c 8.7%, CBG 264  Labs 07/15/19: TSH 1.90, free T4 1.2, free T3 2.8; CMP normal, except glucose 180; CBC normal; cholesterol 177, triglycerides 65, HDL 56, LDL 106; microalbumin/creatinine ratio 3; LH 3.5, FSH 7.7, testosterone 495 (ref 250-827), free testosterone 68.1 (ref 46-224)  Labs 04/28/19: HbA1c 7.7%, CBG 394; U/A positive glucose and moderate ketones  Labs 01/27/19: HbA1c 7.4%, CBG 231  Labs 10/27/18: HbA1c 8.0%, CBG 250  Labs 07/21/18: HbA1c 8.2%, CBG 166  Labs 07/15/18: TSH 1.54, free T4 1.2, free T3 3.5; CMP normal; cholesterol 176, triglycerides 74, HDL 53, LDL 107; urinary microalbumin/creatinine ratio 6  Labs 04/21/18: HbA1c 8.2%, CBG 295  Labs 01/20/18: HbA1c 8.4%, CBG 206  Labs 10/30/17: HbA1c 9.0%, CBG 147  Labs 08/13/17: CBG 354  Labs 06/04/17: CBG 173; BMP normal  Labs 05/29/17: HbA1c 8.2%; TSH 1.84, free T4 1.2, free T3 3.4, CMP normal except for glucose 310 and potassium 5.8; LH 3.7, FSH 5.9, testosterone 634, free testosterone 76 (ref 46-224), estradiol 31; microalbumin/creatinine ratio 4; cholesterol 187, triglycerides 63, HDL 59, LDL 116  Labs 04/03/17: CBG 182  Labs 01/31/17: HbA1c 7.6%, CBG 341  Labs 10/30/16: HbA1c 7.5%  Labs 07/25/16: HbA1c 7.8%  Labs 04/25/16: HbA1c 7.7%  Labs 04/09/16: LH 3.4, FSH 6.0, testosterone 504, free testosterone 85.5 (ref 47-244); cholesterol 176, triglycerides 92, HDL 80, LDL 88; CMP norma except for glucose 224; TSH 1.75, free T4 1.2, free T3 3.2;  microalbumin/creatinine ratio 4  Labs 01/24/16: HbA1c 8.6%  Labs 10/20/15: HbA1c 8.1%  Labs 09/15/15: BMP normal except for glucose of 143 and calcium 8.3.  Labs 07/20/15: HbA1c 8.2%  Labs 03/25/15: HbA1c 7.8%  Labs 12/02/14: HbA1c 8.5%;  LH 4.0, FSH 8.2, testosterone 621.97; CMP normal; TSH 2.01, free T4 1.21, free T3 3.6; cholesterol 155, triglycerides 46, HDL  55, LDL 91; microalbumin/creatinine ratio 6.6  Labs 03/30/14: TSH 1.935, free T4 1.12, free T3 3.2; CMP: normal except for glucose 203; testosterone 624, FSH 6.3, LH 2.4; urine microalbumin/creatinine ratio 6.4; cholesterol 163, triglycerides 55, HDL 64, LDL 80  Labs 08/23/13: TSH 1.139; CMP normal except for a glucose of 145; CBC: WBC 5.4, Hgb 12.7, Hct 34.7%   Assessment and Plan:   ASSESSMENT:  1-2. Type 1 diabetes mellitus/hypoglycemia:  A. Scott Moran's HbA1c has increased to 8.9%. He is no longer having many low BGs.    B. His is doing worse at changing sites and worse at checking BGs. He is doing pretty well at bolusing, some days better than others.   C. He continues to be very inconsistent in taking care of his T1DM.  3. Autonomic neuropathy and inappropriate sinus tachycardia: These problems are better.  4. Peripheral neuropathy: This problem is evident again today.    5. Hypothyroid: He was euthyroid in December 2015, in May 2017, in June 2018, in August 2019, and in August 2020 on his current dose of Synthroid.  6. Hypogonadotropic hypogonadism: His testosterone level in May 2017 was acceptable, but was lower than in December 2015. His testosterone level in June 2018 was good, but his free testosterone was relatively low for his age.  His testosterone in August 2020 was within normal limits, but still relatively low for his age.   7. Hypertension: His BP is normal today. He needs to take his lisinopril and exercise regularly.  8. Goiter/Hashimoto's disease: His goiter is top-normal size today. The pattern of waxing and  waning of thyroid gland size is c/w evolving thyroiditis. The thyroiditis is clinically quiescent 9. Depression: This problem is much better today.  He says that he is taking the escitalopram reliably. Going back to school has also been a major positive factor for him. He does not exhibit any suicidal thoughts or emotions. I would like him to continue to see Dr. [name] and to re-establish care with a psychiatrist. Since he does not have a psychiatrist or a PCP to prescribe escitalopram, I have agreed to prescribe it for him until he can find a new psychiatrist or PCP. Unfortunately, he is not interested in finding another PCP or psychiatrist, so I have continued to prescribe the escitalopram for him.   10. Hypercholesterolemia: His cholesterol levels were elevated in August 2019 and again in August 2020. Exercise and more consistent BG control would help.  11. Noncompliance: He is not doing as well a taking care of his DM as he has done at many other times in the past. I continue to hope that as Scott Moran ages and matures, he will do a better job of taking care of his T1DM and his life.     PLAN:  1. Diagnostic: I reviewed his BG printout. I ordered annual fasting lab tests to be done prior to his next visit.   2. Therapeutic: Continue Synthroid, 25 mcg/day, escitalopram, 10 mg/day, and lisinopril 2.5 mg/day. Give both correction boluses and food boluses. We continued his current pump settings:  Basal rates:   MN: 0.70 5 AM: 0.70 8 AM: 0.650 Noon: 0.900 6 PM: 1.600 9 PM: 1.300  BG targets:  MN: 150 8 AM: 110 10 PM: 150  ISF: 65  ICRs: MN: 15 8 AM: 11 4:30 PM: 15 3. Patient education: Scott Moran is a very intelligent young man who knows intellectually what he should do and how to do it. Unfortunately, he continues to lack the commitment to be as consistent with his T1DM care as he needs to be. If he will start using the Guardian 3 sensor and take advantage of all the benefits of the Medtronic 670G  pump and sensor combination, then his BG control can be much better. 4. Follow-up: 3 months with me  Level of Service: This visit lasted in excess of 60  minutes. More than 50% of the visit was devoted to counseling.  [name] Sers, MD, CDE Adult and Pediatric Endocrinology 03/16/2020 1:51 PM

## 2020-04-01 ENCOUNTER — Telehealth (INDEPENDENT_AMBULATORY_CARE_PROVIDER_SITE_OTHER): Payer: Self-pay | Admitting: "Endocrinology

## 2020-04-01 NOTE — Telephone Encounter (Signed)
Waiting for Dr Audie Clear signature.

## 2020-04-01 NOTE — Telephone Encounter (Signed)
[employer] calling to see if we received the fax for patients insulin pump. They need to verification so they can move forward

## 2020-04-02 ENCOUNTER — Other Ambulatory Visit (INDEPENDENT_AMBULATORY_CARE_PROVIDER_SITE_OTHER): Payer: Self-pay | Admitting: "Endocrinology

## 2020-04-08 DIAGNOSIS — Z794 Long term (current) use of insulin: Secondary | ICD-10-CM | POA: Diagnosis not present

## 2020-06-08 ENCOUNTER — Other Ambulatory Visit (INDEPENDENT_AMBULATORY_CARE_PROVIDER_SITE_OTHER): Payer: Self-pay | Admitting: "Endocrinology

## 2020-06-08 DIAGNOSIS — F3289 Other specified depressive episodes: Secondary | ICD-10-CM

## 2020-06-15 ENCOUNTER — Other Ambulatory Visit: Payer: Self-pay

## 2020-06-15 ENCOUNTER — Encounter (INDEPENDENT_AMBULATORY_CARE_PROVIDER_SITE_OTHER): Payer: Self-pay | Admitting: "Endocrinology

## 2020-06-15 ENCOUNTER — Ambulatory Visit (INDEPENDENT_AMBULATORY_CARE_PROVIDER_SITE_OTHER): Payer: BC Managed Care – PPO | Admitting: "Endocrinology

## 2020-06-15 VITALS — BP 118/84 | HR 78 | Wt 153.4 lb

## 2020-06-15 DIAGNOSIS — F32A Depression, unspecified: Secondary | ICD-10-CM

## 2020-06-15 DIAGNOSIS — E1043 Type 1 diabetes mellitus with diabetic autonomic (poly)neuropathy: Secondary | ICD-10-CM

## 2020-06-15 DIAGNOSIS — Z9119 Patient's noncompliance with other medical treatment and regimen: Secondary | ICD-10-CM

## 2020-06-15 DIAGNOSIS — E049 Nontoxic goiter, unspecified: Secondary | ICD-10-CM | POA: Diagnosis not present

## 2020-06-15 DIAGNOSIS — I10 Essential (primary) hypertension: Secondary | ICD-10-CM

## 2020-06-15 DIAGNOSIS — E1065 Type 1 diabetes mellitus with hyperglycemia: Secondary | ICD-10-CM | POA: Diagnosis not present

## 2020-06-15 DIAGNOSIS — E10649 Type 1 diabetes mellitus with hypoglycemia without coma: Secondary | ICD-10-CM

## 2020-06-15 DIAGNOSIS — E7800 Pure hypercholesterolemia, unspecified: Secondary | ICD-10-CM

## 2020-06-15 DIAGNOSIS — E063 Autoimmune thyroiditis: Secondary | ICD-10-CM

## 2020-06-15 DIAGNOSIS — Z91199 Patient's noncompliance with other medical treatment and regimen due to unspecified reason: Secondary | ICD-10-CM

## 2020-06-15 DIAGNOSIS — F329 Major depressive disorder, single episode, unspecified: Secondary | ICD-10-CM

## 2020-06-15 DIAGNOSIS — E1042 Type 1 diabetes mellitus with diabetic polyneuropathy: Secondary | ICD-10-CM

## 2020-06-15 LAB — POCT GLYCOSYLATED HEMOGLOBIN (HGB A1C): Hemoglobin A1C: 8.3 % — AB (ref 4.0–5.6)

## 2020-06-15 LAB — POCT GLUCOSE (DEVICE FOR HOME USE): Glucose Fasting, POC: 209 mg/dL — AB (ref 70–99)

## 2020-06-15 NOTE — Patient Instructions (Signed)
Follow up visit in 3 months. 

## 2020-06-15 NOTE — Progress Notes (Signed)
Subjective:  Patient Name: Scott Moran Date of Birth: 1991-11-19  MRN: [id number]  Scott Moran  presents to the office today for follow-up of his type 1 diabetes mellitus, goiter, hypoglycemia, depression, social anxiety disorder, avoidant personality disorder, hypothyroidism, thyroiditis, diabetic autonomic neuropathy, inappropriate sinus tachycardia, non-proliferative diabetic retinopathy, diabetic peripheral neuropathy, fatigue, hypogonadotropic hypogonadism, and medical non-compliance.  HISTORY OF PRESENT ILLNESS:   Scott Moran is a 29 y.o. Caucasian young man. Ogle was unaccompanied.   1. I first saw the patient in consultation on 03/27/05 in the [facility] in [place], [place]. He had been referred by his primary care provider, [name], in [place], [place], for evaluation and treatment of poorly controlled type 1 diabetes. The patient was 29 years old.  A. The patient had been diagnosed with type 1 diabetes in February of 1995 at age 29. He was on an insulin pump when I first met him. His hemoglobin A1c had increased from 7.1% in December of 2005 to 8.2% in April 2006. He was having frequent snacks that he was not taking boluses for. He was also frequently noncompliant with checking blood sugars and taking meal boluses. I adjusted his basal rates.  B. At his next [place] visit on 06/19/05, he was still very noncompliant. His mental status was normal. He had a 25 gram goiter. His father was serving in [place] with the [employer]. There was a lot of stress at home. I again adjusted his basal rates.  C. At the end of July 2006 we closed our satellite office in [place]. The patient elected to be followed at our clinic here in [place]. I have followed him here ever since.  2. During the past fifteen years, the patient has had several clinical issues.   A. T1DM:    1). The patient's blood glucose control has usually been fairly good  for a teenager/young adult. His maximum hemoglobin A1c occurred on 04/16/06 when the hemoglobin A1c was 12.1%. Since then the hemoglobin A1c values have varied between 6.9% and 8.6%, until his HbA1c on 10/30/17 of 9.0%. Since then his HbA1c has varied from 7.4% to 8.4%.    2). He is still often noncompliant with checking blood sugars, bolusing, and changing his insulin pump sites on time, but he is more compliant and more careful than he used to be. He has never required hospital re-admission for DKA, severe hyperglycemia, or hypoglycemia.   3). He started his new Medtronic 670G insulin pump and Guardian 3 sensor in September 2017, but later stopped using the sensor and has refused to resume using it.    B. Goiter, Hashimoto's thyroiditis, and hypothyroidism: In late 2008 and early 2009 the patient developed hypothyroidism secondary to Hashimoto's disease. I started him on Synthroid, 25 mcg per day. When he takes his medication, he is euthyroid.  C. Autonomic neuropathy and inappropriate sinus tachycardia: In late 2008 he developed autonomic neuropathy which manifested as inappropriate sinus tachycardia. These problems have varied in parallel with his BGs and HbA1c values.  D. Diabetic peripheral neuropathy: The patient has had symptoms and signs of mild diabetic peripheral neuropathy at several times in the past. His peripheral neuropathic signs and symptoms have similarly varied in parallel with his BGs and HbA1c values.   E. Non-proliferative diabetic retinopathy: This problem was identified during a diabetic eye exam in June 2010.  F. Hypogonadotropic hypogonadism: The patient has always been somewhat lackadaisical and relatively passive. On 06/29/10 his [place] was 4.9, LH  was 2.4, testosterone was 399.66, and free testosterone was 79.8. The testosterone and free testosterone were relatively low for an 29 year old young man. At the same time the [facility] and LH while "normal", appeared to be actually  inappropriately low for the level of testosterone he had. We discussed the options for treatment including exercise and medication, to include AndroGel. The patient chose the exercise option. By 10/19/10 the [facility] was 6.8, the LH was 3.2, total testosterone was 615.88 and the free testosterone was 131.6. In retrospect, when he was more depressed he may have had less gonadotropic function.   G. Depression/Anxiety/Personality disorders:    1). The patient has been diagnosed with depression at different times in the past. For some time he took Remeron (mirtazapine), but discontinued that medication in early 2012. I had been trying to convince him for some time that he was depressed and that he should seek out help. He eventually did decide to seek psychiatric help.    2). On 06/08/13 Caison was evaluated by [name], a noted psychiatrist in [place].  [name] and his staff performed neuropsychiatric testing. On the ORIN EBERWEIN had problems with non-verbal reasoning and emotional intelligence. 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 [name] prescribed escitalopram, 10 mg/day. Priest was supposed to continue to see [name] in follow up. Unfortunately, [place]ton did not follow up.    3). Keland was then referred to a therapist, [name]. Ace enjoyed working with Dr. [name] in the past and felt that he had made progress over time. Unfortunately, Katai has not been back to see Dr. [name] since the summer of 2018. I have been prescribing Kadeen's escitalopram ever since.    H. 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   3. The patient's last [facility] visit was on 03/16/20. At that visit I continued his pump settings. I also continued his Synthroid, escitalopram, and lisinopril doses.   A. In  the interim he has been healthy.  He had his second [place] covid vaccination. He is not getting much exercise.  B. He may take on-line courses through [employer] this semester. He will probably complete his two-year program in the future.   C. He is still not using his Guardian 3 sensor. He has his new Medtronic 780 pump, but has not started it yet.    Juanda Bond has not seen Dr. [name], his therapist, due to conflicts with his academic schedule and lack of interest.   E. Jaziel takes his Synthroid dose of 25 mcg/day every night. He takes escitalopram 10 mg and  lisinopril 2.5 mg daily.    F. He has his learner's permit, but has not wanted to try driving. He is severely afraid of other drivers that are "terrible".   4. Pertinent Review of Systems:  Constitutional: Browning feels "pretty good". His life is "pretty good" overall. His allergies are not acting up.  Eyes: Vision is good with his glasses. There are no significant eye complaints. Last eye exam was in July 2021. Everything looked fine.  Mouth: No problems.  Neck: Jarone has no complaints of anterior neck swelling, soreness, tenderness,  pressure, discomfort, or difficulty swallowing.  Heart: Heart rate increases with exercise or other physical activity. The patient has no complaints of palpitations, irregular heat beats, chest pain, or chest pressure. Gastrointestinal: No postprandial  bloating. Bowel movents seem normal. The patient has no other complaints of excessive hunger, acid reflux, upset stomach, stomach aches or pains, diarrhea, or constipation. Legs: Muscle mass and strength seem normal. There are no complaints of numbness, tingling, burning, or pain. No edema is noted. Feet: There are no obvious foot problems. There are no complaints of numbness, tingling, burning, or pain. No edema is noted. Hypoglycemia: He has had "some" low BGs recently.  Emotional/psychological: The patient feels "pretty good".  Diabetes identification: He has his  pump and a wallet card.  .  5. BG printout: We have data for the past 2 weeks. He is changing his sites every 9+ days. He is checking BGs 0-3 times per day, for an average of 1.2 times per day, compared with 1.1 times per day at his last visit. He checked BGs 17 times in 14 days. He bolused 1-4 times per day, but most of the boluses were only food boluses. Average BG is 203, compared with 199 at his last visit and with 238 at his prior visit. BG range is 128-389, compared with 101-292 at his last visit and with 58 - >400 at his prior visit.     PAST MEDICAL, FAMILY, AND SOCIAL HISTORY:  Past Medical History:  Diagnosis Date  . DM type 1 with diabetic peripheral neuropathy ([place])   . Goiter   . Hypertension   . Hypoglycemia associated with diabetes ([place])   . Hypogonadotropic hypogonadism ([place])   . Hypothyroidism, acquired, autoimmune   . Tachycardia   . Thyroiditis, autoimmune   . Type 1 diabetes mellitus not at goal [facility])   . Type 1 diabetes mellitus with diabetic autonomic neuropathy ([facility])     Family History  Problem Relation Age of Onset  . Diabetes Maternal Uncle   . Hypertension Maternal Grandmother   . Thyroid disease Neg Hx   . Obesity Neg Hx   . Kidney disease Neg Hx   . Cancer Neg Hx   . Anemia Neg Hx      Current Outpatient Medications:  .  escitalopram (LEXAPRO) 10 MG tablet, TAKE 1 TABLET BY MOUTH ONCE DAILY, Disp: 30 tablet, Rfl: 4 .  insulin aspart (NOVOLOG) 100 UNIT/ML injection, INJECT 300 UNITS INTO PUMP EVERY 48-72 HOURS AND PER PROTOCOL FOR HYPERGLYCEMIA, Disp: 30 mL, Rfl: 5 .  levothyroxine (SYNTHROID) 25 MCG tablet, TAKE 1 TABLET BY MOUTH DAILY BEFORE BREAKFAST, Disp: 30 tablet, Rfl: 5 .  lisinopril (ZESTRIL) 2.5 MG tablet, TAKE 1 TABLET BY MOUTH ONCE DAILY, Disp: 30 tablet, Rfl: 11 .  acetone, urine, test strip, Check ketones per protocol (Patient not taking: Reported on 03/16/2020), Disp: 50 each, Rfl: 3 .  glucagon (GLUCAGON EMERGENCY) 1 MG injection, Inject  1 mg intramuscularly in thigh 1 time for severe hypoglycemia if unresponsive, unconscious, unable to swallow or has a seizure. (Patient not taking: Reported on 06/15/2020), Disp: 2 kit, Rfl: 3 .  glucose blood (BAYER CONTOUR NEXT TEST) test strip, Check sugar 10 x daily (Patient not taking: Reported on 06/15/2020), Disp: 300 each, Rfl: 3 .  insulin lispro (HUMALOG) 100 UNIT/ML injection, Up to 300 units of insulin in insulin pump every 48-72 hours pre DKA and Hyperglycemia protocols (Patient not taking: Reported on 08/13/2019), Disp: 40 mL, Rfl: 5 .  ondansetron (ZOFRAN ODT) 4 MG disintegrating tablet, Take 1 tablet (4 mg total) by mouth every 8 (eight) hours as needed for nausea or vomiting. (Patient not taking: Reported on 08/13/2019), Disp: 20 tablet, Rfl: 0 .  promethazine (PHENERGAN) 25 MG tablet, Take 1 tablet (25 mg total) by mouth every 6 (six) hours as needed for nausea or vomiting. (Patient not taking: Reported on 08/13/2019), Disp: 20 tablet, Rfl: 0  Allergies as of 06/15/2020  . (No Known Allergies)    1. Work and Family: Byron has to do more course work to complete his two-year program.  2. Activities: Few physical activities. He is a history and politics buff. He frequently participates in online forum discussions about historical and political topics. He is thinking about becoming a history Pharmacist, hospital or a Building control surveyor.  3. Smoking, alcohol, or drugs: None 4. Primary Care Provider: He does not have a current primary care provider.  5. Psychiatry: He no longer has a psychiatrist.  6. Psychologist: He has not seen [name] since 2018.   REVIEW OF SYSTEMS: There are no other significant problems involving Rashard's other body systems.   Objective:  Vital Signs:  BP 118/84   Pulse 78   Wt 153 lb 6.4 oz (69.6 kg)   BMI 23.94 kg/m     Ht Readings from Last 3 Encounters:  08/13/19 5' 7.13" (1.705 m)  01/27/19 5' 7.17" (1.706 m)  10/27/18 5' 7.24" (1.708 m)   Wt Readings from  Last 3 Encounters:  06/15/20 153 lb 6.4 oz (69.6 kg)  03/16/20 151 lb 12.8 oz (68.9 kg)  11/12/19 152 lb 6.4 oz (69.1 kg)   PHYSICAL EXAM: Constitutional: Tavius was alert and cognitively normal today. His weight has increased 1.5 pounds since last visit. His affect was upbeat and he was again very socially talkative, but in an adult fashion. His insight was good today. He has been similarly inconsistent in his DM care since his last visit.  Face: The face appears normal.  Eyes: There is no obvious arcus or proptosis. Moisture appears normal. Mouth: The oropharynx and tongue appear normal. Oral moisture is normal. Neck: The neck looks normal. No carotid bruits are noted. The thyroid gland is diffusely larger today at about 21 grams. The consistency of the thyroid gland is normal today. The thyroid gland is not tender to palpation.  Lungs: The lungs are clear to auscultation. Air movement is good. Heart: Heart rate remains borderline tachycardic. Heart rhythm is regular. Heart sounds S1 and S2 are normal. I did not appreciate any pathologic cardiac murmurs. Abdomen: The abdomen is mildly enlarged. Bowel sounds are normal. There is no obvious hepatomegaly, splenomegaly, or other mass effect.  Arms: Muscle size and bulk are normal for age. Hands: There is no obvious tremor. Phalangeal and metacarpophalangeal joints are normal. Palmar muscles are normal. Palmar skin is normal. Palmar moisture is also normal. Legs: Muscles appear normal for age. No edema is present. Feet: Feet are normally formed. Dorsalis pedal pulses are faint 1+ bilaterally.   Neurologic: Strength is normal for age in both the upper and lower extremities. Muscle tone is normal. Sensation to touch is normal in both legs, but decreased in both heels.      LAB DATA:   Results for orders placed or performed in visit on 06/15/20 (from the past 504 hour(s))  POCT Glucose (Device for Home Use)   Collection Time: 06/15/20  1:57 PM   Result Value Ref Range   Glucose Fasting, POC 209 (A) 70 - 99 mg/dL   POC Glucose    POCT glycosylated hemoglobin (Hb A1C)   Collection Time: 06/15/20  2:03 PM  Result Value Ref Range   Hemoglobin A1C 8.3 (A) 4.0 -  5.6 %   HbA1c POC (<> result, manual entry)     HbA1c, POC (prediabetic range)     HbA1c, POC (controlled diabetic range)     Labs 06/15/20: HbA1c 8.3%, CBG 209  Labs 03/16/20: HbA1c 8.9%, CBG 142  Labs 11/12/19: HbA1c 7.5%, CBG 222; HbA1c is lower, but at the cost of too many low BGs.  Labs 08/13/19: HbA1c 8.7%, CBG 264  Labs 07/15/19: TSH 1.90, free T4 1.2, free T3 2.8; CMP normal, except glucose 180; CBC normal; cholesterol 177, triglycerides 65, HDL 56, LDL 106; microalbumin/creatinine ratio 3; LH 3.5, FSH 7.7, testosterone 495 (ref 250-827), free testosterone 68.1 (ref 46-224)  Labs 04/28/19: HbA1c 7.7%, CBG 394; U/A positive glucose and moderate ketones  Labs 01/27/19: HbA1c 7.4%, CBG 231  Labs 10/27/18: HbA1c 8.0%, CBG 250  Labs 07/21/18: HbA1c 8.2%, CBG 166  Labs 07/15/18: TSH 1.54, free T4 1.2, free T3 3.5; CMP normal; cholesterol 176, triglycerides 74, HDL 53, LDL 107; urinary microalbumin/creatinine ratio 6  Labs 04/21/18: HbA1c 8.2%, CBG 295  Labs 01/20/18: HbA1c 8.4%, CBG 206  Labs 10/30/17: HbA1c 9.0%, CBG 147  Labs 08/13/17: CBG 354  Labs 06/04/17: CBG 173; BMP normal  Labs 05/29/17: HbA1c 8.2%; TSH 1.84, free T4 1.2, free T3 3.4, CMP normal except for glucose 310 and potassium 5.8; LH 3.7, FSH 5.9, testosterone 634, free testosterone 76 (ref 46-224), estradiol 31; microalbumin/creatinine ratio 4; cholesterol 187, triglycerides 63, HDL 59, LDL 116  Labs 04/03/17: CBG 182  Labs 01/31/17: HbA1c 7.6%, CBG 341  Labs 10/30/16: HbA1c 7.5%  Labs 07/25/16: HbA1c 7.8%  Labs 04/25/16: HbA1c 7.7%  Labs 04/09/16: LH 3.4, FSH 6.0, testosterone 504, free testosterone 85.5 (ref 47-244); cholesterol 176, triglycerides 92, HDL 80, LDL 88; CMP norma except for glucose  224; TSH 1.75, free T4 1.2, free T3 3.2; microalbumin/creatinine ratio 4  Labs 01/24/16: HbA1c 8.6%  Labs 10/20/15: HbA1c 8.1%  Labs 09/15/15: BMP normal except for glucose of 143 and calcium 8.3.  Labs 07/20/15: HbA1c 8.2%  Labs 03/25/15: HbA1c 7.8%  Labs 12/02/14: HbA1c 8.5%;  LH 4.0, FSH 8.2, testosterone 621.97; CMP normal; TSH 2.01, free T4 1.21, free T3 3.6; cholesterol 155, triglycerides 46, HDL  55, LDL 91; microalbumin/creatinine ratio 6.6  Labs 03/30/14: TSH 1.935, free T4 1.12, free T3 3.2; CMP: normal except for glucose 203; testosterone 624, FSH 6.3, LH 2.4; urine microalbumin/creatinine ratio 6.4; cholesterol 163, triglycerides 55, HDL 64, LDL 80  Labs 08/23/13: TSH 1.139; CMP normal except for a glucose of 145; CBC: WBC 5.4, Hgb 12.7, Hct 34.7%   Assessment and Plan:   ASSESSMENT:  1-2. Type 1 diabetes mellitus/hypoglycemia:  A. Shloime's HbA1c has decreased to 8.3%. He is not having many low BGs.    B. His is doing worse at changing sites and on some days is worse at checking BGs. He is doing pretty well at bolusing, some days better than others.   C. He continues to be very inconsistent in taking care of his T1DM.  3. Autonomic neuropathy and inappropriate sinus tachycardia: These problems are better.  4. Peripheral neuropathy: This problem is evident again today.    5. Hypothyroid: He was euthyroid in December 2015, in May 2017, in June 2018, in August 2019, and in August 2020 on his current dose of Synthroid. He had labs drawn today.  6. Hypogonadotropic hypogonadism: His testosterone level in May 2017 was acceptable, but was lower than in December 2015. His testosterone level in June 2018 was good, but  his free testosterone was relatively low for his age.  His testosterone in August 2020 was within normal limits, but still relatively low for his age. He had labs drawn today.  7. Hypertension: His DBP is high today. He needs to take his lisinopril and exercise regularly.   8. Goiter/Hashimoto's disease: His goiter is enlarged today. The pattern of waxing and waning of thyroid gland size is c/w evolving thyroiditis. The thyroiditis is clinically quiescent 9. Depression: This problem is much better today.  He says that he is taking the escitalopram reliably. Going back to school has also been a major positive factor for him. He does not exhibit any suicidal thoughts or emotions. I would like him to continue to see Dr. [name] and to re-establish care with a psychiatrist. Since he does not have a psychiatrist or a PCP to prescribe escitalopram, I have agreed to prescribe it for him until he can find a new psychiatrist or PCP. Unfortunately, he is not interested in finding another PCP or psychiatrist, so I have continued to prescribe the escitalopram for him.   10. Hypercholesterolemia: His cholesterol levels were elevated in August 2019 and again in August 2020. Exercise and more consistent BG control would help. Labs were drawn today.  11. Noncompliance: He is doing somewhat better at taking care of his DM as he has done at many other times in the past. I continue to hope that as Cathy ages and matures, he will do a better job of taking care of his T1DM. At this time, however, he remains consistently inconsistent.      PLAN:  1. Diagnostic: I reviewed his BG printout. He had annual fasting lab tests done this afternoon. We will call him with results.    2. Therapeutic: Continue Synthroid, 25 mcg/day, escitalopram, 10 mg/day, and lisinopril 2.5 mg/day. Give both correction boluses and food boluses. We continued his current pump settings:  Basal rates:   MN: 0.70 5 AM: 0.70 8 AM: 0.650 Noon: 0.900 6 PM: 1.600 9 PM: 1.300  BG targets:  MN: 150 8 AM: 110 10 PM: 150  ISF: 65  ICRs: MN: 15 8 AM: 11 4:30 PM: 15 3. Patient education: Jayten is a very intelligent young man who knows intellectually what he should do and how to do it. Unfortunately, he continues to lack  the commitment to be as consistent with his T1DM care as he needs to be. If he will start using the Guardian 3 sensor and take advantage of all the benefits of the Medtronic 670G pump and sensor combination, then his BG control can be much better. 4. Follow-up: 3 months with me  Level of Service: This visit lasted in excess of 60  minutes. More than 50% of the visit was devoted to counseling.  [name] Sers, MD, CDE Adult and Pediatric Endocrinology 06/15/2020 2:18 PM

## 2020-06-16 LAB — COMPREHENSIVE METABOLIC PANEL
AG Ratio: 1.6 (calc) (ref 1.0–2.5)
ALT: 20 U/L (ref 9–46)
AST: 16 U/L (ref 10–40)
Albumin: 4.5 g/dL (ref 3.6–5.1)
Alkaline phosphatase (APISO): 83 U/L (ref 36–130)
BUN: 16 mg/dL (ref 7–25)
CO2: 22 mmol/L (ref 20–32)
Calcium: 9.3 mg/dL (ref 8.6–10.3)
Chloride: 97 mmol/L — ABNORMAL LOW (ref 98–110)
Creat: 0.87 mg/dL (ref 0.60–1.35)
Globulin: 2.8 g/dL (calc) (ref 1.9–3.7)
Glucose, Bld: 224 mg/dL — ABNORMAL HIGH (ref 65–99)
Potassium: 4.3 mmol/L (ref 3.5–5.3)
Sodium: 134 mmol/L — ABNORMAL LOW (ref 135–146)
Total Bilirubin: 1 mg/dL (ref 0.2–1.2)
Total Protein: 7.3 g/dL (ref 6.1–8.1)

## 2020-06-16 LAB — LIPID PANEL
Cholesterol: 180 mg/dL (ref <200)
HDL: 52 mg/dL (ref >=40)
LDL Cholesterol (Calc): 110 mg/dL (calc) — ABNORMAL HIGH
Non-HDL Cholesterol (Calc): 128 mg/dL (calc) (ref <130)
Total CHOL/HDL Ratio: 3.5 (calc) (ref <5.0)
Triglycerides: 90 mg/dL (ref <150)

## 2020-06-16 LAB — MICROALBUMIN / CREATININE URINE RATIO
Creatinine, Urine: 254 mg/dL (ref 20–320)
Microalb Creat Ratio: 11 mcg/mg creat (ref <30)
Microalb, Ur: 2.7 mg/dL

## 2020-06-16 LAB — LUTEINIZING HORMONE: LH: 2.4 m[IU]/mL (ref 1.5–9.3)

## 2020-06-16 LAB — T4, FREE: Free T4: 1.2 ng/dL (ref 0.8–1.8)

## 2020-06-16 LAB — TESTOSTERONE TOTAL,FREE,BIO, MALES
Albumin: 4.5 g/dL (ref 3.6–5.1)
Sex Hormone Binding: 40 nmol/L (ref 10–50)
Testosterone, Bioavailable: 148.9 ng/dL (ref >=110.0)
Testosterone, Free: 72.4 pg/mL (ref 46.0–224.0)
Testosterone: 612 ng/dL (ref 250–827)

## 2020-06-16 LAB — T3, FREE: T3, Free: 2.8 pg/mL (ref 2.3–4.2)

## 2020-06-16 LAB — TSH: TSH: 2.42 mIU/L (ref 0.40–4.50)

## 2020-06-16 LAB — FOLLICLE STIMULATING HORMONE: FSH: 6.7 m[IU]/mL (ref 1.6–8.0)

## 2020-07-05 ENCOUNTER — Encounter (INDEPENDENT_AMBULATORY_CARE_PROVIDER_SITE_OTHER): Payer: Self-pay | Admitting: *Deleted

## 2020-07-05 ENCOUNTER — Other Ambulatory Visit (INDEPENDENT_AMBULATORY_CARE_PROVIDER_SITE_OTHER): Payer: Self-pay | Admitting: *Deleted

## 2020-07-05 MED ORDER — PRAVASTATIN SODIUM 10 MG PO TABS
10.0000 mg | ORAL_TABLET | Freq: Every evening | ORAL | 1 refills | Status: DC
Start: 2020-07-05 — End: 2021-01-30

## 2020-07-11 DIAGNOSIS — Z794 Long term (current) use of insulin: Secondary | ICD-10-CM | POA: Diagnosis not present

## 2020-07-12 ENCOUNTER — Other Ambulatory Visit (INDEPENDENT_AMBULATORY_CARE_PROVIDER_SITE_OTHER): Payer: Self-pay | Admitting: "Endocrinology

## 2020-09-18 NOTE — Progress Notes (Signed)
Subjective:  Patient Name: Scott Moran Date of Birth: 1991-11-19  MRN: [id number]  Scott Moran  presents to the office today for follow-up of his type 1 diabetes mellitus, goiter, hypoglycemia, depression, social anxiety disorder, avoidant personality disorder, hypothyroidism, thyroiditis, diabetic autonomic neuropathy, inappropriate sinus tachycardia, non-proliferative diabetic retinopathy, diabetic peripheral neuropathy, fatigue, hypogonadotropic hypogonadism, and medical non-compliance.  HISTORY OF PRESENT ILLNESS:   Scott Moran is a 29 y.o. Caucasian young man. Scott Moran was unaccompanied.   1. I first saw the patient in consultation on 03/27/05 in the [facility] in [place], [place]. He had been referred by his primary care provider, Dr. Wendie Moran, in [place], [place], for evaluation and treatment of poorly controlled type 1 diabetes. The patient was 29 years old.  A. The patient had been diagnosed with type 1 diabetes in February of 1995 at age 89. He was on an insulin pump when I first met him. His hemoglobin A1c had increased from 7.1% in December of 2005 to 8.2% in April 2006. He was having frequent snacks that he was not taking boluses for. He was also frequently noncompliant with checking blood sugars and taking meal boluses. I adjusted his basal rates.  B. At his next [place] visit on 06/19/05, he was still very noncompliant. His mental status was normal. He had a 25 gram goiter. His father was serving in [place] with the [employer]. There was a lot of stress at home. I again adjusted his basal rates.  C. At the end of July 2006 we closed our satellite office in [place]. The patient elected to be followed at our clinic here in [place]. I have followed him here ever since.  2. During the past fifteen years, the patient has had several clinical issues.   A. T1DM:    1). The patient's blood glucose control has usually been fairly good  for a teenager/young adult. His maximum hemoglobin A1c occurred on 04/16/06 when the hemoglobin A1c was 12.1%. Since then the hemoglobin A1c values have varied between 6.9% and 8.6%, until his HbA1c on 10/30/17 of 9.0%. Since then his HbA1c has varied from 7.4% to 8.4%.    2). He is still often noncompliant with checking blood sugars, bolusing, and changing his insulin pump sites on time, but he is more compliant and more careful than he used to be. He has never required hospital re-admission for DKA, severe hyperglycemia, or hypoglycemia.   3). He started his new Medtronic 670G insulin pump and Guardian 3 sensor in September 2017, but later stopped using the sensor and has refused to resume using it.    B. Goiter, Hashimoto's thyroiditis, and hypothyroidism: In late 2008 and early 2009 the patient developed hypothyroidism secondary to Hashimoto's disease. I started him on Synthroid, 25 mcg per day. When he takes his medication, he is euthyroid.  C. Autonomic neuropathy and inappropriate sinus tachycardia: In late 2008 he developed autonomic neuropathy which manifested as inappropriate sinus tachycardia. These problems have varied in parallel with his BGs and HbA1c values.  D. Diabetic peripheral neuropathy: The patient has had symptoms and signs of mild diabetic peripheral neuropathy at several times in the past. His peripheral neuropathic signs and symptoms have similarly varied in parallel with his BGs and HbA1c values.   E. Non-proliferative diabetic retinopathy: This problem was identified during a diabetic eye exam in June 2010.  F. Hypogonadotropic hypogonadism: The patient has always been somewhat lackadaisical and relatively passive. On 06/29/10 his Scott Moran was 4.9, LH  was 2.4, testosterone was 399.66, and free testosterone was 79.8. The testosterone and free testosterone were relatively low for an 29 year old young man. At the same time the [facility] and LH while "normal", appeared to be actually  inappropriately low for the level of testosterone he had. We discussed the options for treatment including exercise and medication, to include AndroGel. The patient chose the exercise option. By 10/19/10 the [facility] was 6.8, the LH was 3.2, total testosterone was 615.88 and the free testosterone was 131.6. In retrospect, when he was more depressed he may have had less gonadotropic function.   G. Depression/Anxiety/Personality disorders:    1). The patient has been diagnosed with depression at different times in the past. For some time he took Remeron (mirtazapine), but discontinued that medication in early 2012. I had been trying to convince him for some time that he was depressed and that he should seek out help. He eventually did decide to seek psychiatric help.    2). On 06/08/13 Scott Moran was evaluated by Dr. Carlena Moran, a noted psychiatrist in [place].  Dr. [name] and his staff performed neuropsychiatric testing. On the Scott Moran had problems with non-verbal reasoning and emotional intelligence. Dr. 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 Dr. [name] prescribed escitalopram, 10 mg/day. Scott Moran was supposed to continue to see Dr. [name] in follow up. Unfortunately, Scott Moran did not follow up.    3). Scott Moran was then referred to a therapist, Dr. Celso Moran. Scott Moran enjoyed working with Dr. [name] in the past and felt that he had made progress over time. Unfortunately, Scott Moran has not been back to see Dr. [name] since the summer of 2018. I have been prescribing Scott Moran's escitalopram ever since.    H. 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   3. The patient's last [facility] visit was on 06/15/20. At that visit I continued his pump settings. I also continued his Synthroid, escitalopram, and lisinopril doses. After  reviewing his last lab results, I started him on pravastatin, 10 mg/day.   A. In the interim he has been healthy.  He had his second [place] covid vaccination in May 2021. He is not getting much exercise.  B. He is taking both in-person and on-line courses at [employer] this semester. He will probably complete his two-year program in the future.   C. He is still not using his Guardian 3 sensor. He has his new Medtronic 780 pump, but has not started it yet.    Juanda Bond has not seen Dr. [name], his therapist, due to conflicts with his academic schedule and lack of interest.   E. Delonta takes his Synthroid dose of 25 mcg/day every night. He takes escitalopram 10 mg and  lisinopril 2.5 mg daily.    F. He has his learner's permit, but has not wanted to try driving. He is severely afraid of other drivers that are "terrible".   4. Pertinent Review of Systems:  Constitutional: Scott Moran feels "really good". His life is "pretty good overall". His allergies are not acting up.  Eyes: Vision is good with his glasses. There are no significant eye complaints. Last eye exam was in July 2021. Everything looked fine.  Mouth: No problems. He had a dental exam last week. "Everything looked fine."  Neck: Scott Moran has no complaints of anterior neck swelling, soreness, tenderness,  pressure, discomfort, or difficulty swallowing.  Heart: Heart rate increases with exercise or other physical activity. The patient has no complaints of palpitations, irregular heat beats, chest pain, or chest pressure. Gastrointestinal: No postprandial bloating. Bowel movents seem normal. The patient has no other complaints of excessive hunger, acid reflux, upset stomach, stomach aches or pains, diarrhea, or constipation. Hands: He plays video games well.  Legs: Muscle mass and strength seem normal. There are no complaints of numbness, tingling, burning, or pain. No edema is noted. Feet: There are no obvious foot problems. There are no complaints of  numbness, tingling, burning, or pain. No edema is noted. Hypoglycemia: He has had "a few" low BGs recently.  Emotional/psychological: The patient feels "pretty good".  Diabetes identification: He has his pump and a wallet card.  .  5. BG printout: We have data for the past 2 weeks. He is changing his sites every 6+ days. He is checking BGs 1-4 times per day, for an average of 2.4 times per day, compared with 1.2 times per day at his last visit. He checked BGs 33 times in 14 days. He bolused 0-8 times per day, but more of the boluses were correction doses and food boluses. Average BG is 232, compared with 203 at his last visit and with 199 at his prior visit. BG range is 56->400; compared with 28-389 at his last visit, and with 101-292 at his prior visit. The more often he changes his sites, checks his BGs, and boluses, the better his BGs are.    PAST MEDICAL, FAMILY, AND SOCIAL HISTORY: Past Medical History:  Diagnosis Date  . DM type 1 with diabetic peripheral neuropathy ([place])   . Goiter   . Hypertension   . Hypoglycemia associated with diabetes ([place])   . Hypogonadotropic hypogonadism ([place])   . Hypothyroidism, acquired, autoimmune   . Tachycardia   . Thyroiditis, autoimmune   . Type 1 diabetes mellitus not at goal [facility])   . Type 1 diabetes mellitus with diabetic autonomic neuropathy ([facility])     Family History  Problem Relation Age of Onset  . Diabetes Maternal Uncle   . Hypertension Maternal Grandmother   . Thyroid disease Neg Hx   . Obesity Neg Hx   . Kidney disease Neg Hx   . Cancer Neg Hx   . Anemia Neg Hx      Current Outpatient Medications:  .  escitalopram (LEXAPRO) 10 MG tablet, TAKE 1 TABLET BY MOUTH ONCE DAILY, Disp: 30 tablet, Rfl: 4 .  insulin aspart (NOVOLOG) 100 UNIT/ML injection, INJECT 300 UNITS INTO PUMP EVERY 48-72 HOURS AND PER PROTOCOL FOR HYPERGLYCEMIA, Disp: 30 mL, Rfl: 5 .  levothyroxine (SYNTHROID) 25 MCG tablet, TAKE 1 TABLET BY MOUTH DAILY BEFORE  BREAKFAST, Disp: 30 tablet, Rfl: 5 .  lisinopril (ZESTRIL) 2.5 MG tablet, TAKE 1 TABLET BY MOUTH ONCE DAILY, Disp: 30 tablet, Rfl: 5 .  pravastatin (PRAVACHOL) 10 MG tablet, Take 1 tablet (10 mg total) by mouth every evening., Disp: 90 tablet, Rfl: 1 .  acetone, urine, test strip, Check ketones per protocol (Patient not taking: Reported on 03/16/2020), Disp: 50 each, Rfl: 3 .  glucagon 1 MG injection, Inject 1 mg intramuscularly in thigh 1 time for severe hypoglycemia if unresponsive, unconscious, unable to swallow or has a seizure., Disp: 2 kit, Rfl: 3 .  glucose blood (BAYER CONTOUR NEXT TEST) test strip, Check sugar 10 x daily (Patient not taking: Reported on 06/15/2020), Disp: 300 each, Rfl: 3 .  insulin lispro (HUMALOG) 100 UNIT/ML injection, Up  to 300 units of insulin in insulin pump every 48-72 hours pre DKA and Hyperglycemia protocols (Patient not taking: Reported on 08/13/2019), Disp: 40 mL, Rfl: 5 .  ondansetron (ZOFRAN ODT) 4 MG disintegrating tablet, Take 1 tablet (4 mg total) by mouth every 8 (eight) hours as needed for nausea or vomiting. (Patient not taking: Reported on 08/13/2019), Disp: 20 tablet, Rfl: 0 .  promethazine (PHENERGAN) 25 MG tablet, Take 1 tablet (25 mg total) by mouth every 6 (six) hours as needed for nausea or vomiting. (Patient not taking: Reported on 08/13/2019), Disp: 20 tablet, Rfl: 0  Allergies as of 09/19/2020  . (No Known Allergies)    1. Work and Family: Scott Moran has to do more course work to complete his two-year program. He really enjoys his critical thinking course, which is largely about [place] history and philosophy.  2. Activities: Few physical activities. He is a history and politics buff. He frequently participates in online forum discussions about historical and political topics. He is still thinking about becoming a history Pharmacist, hospital or a Building control surveyor.  3. Smoking, alcohol, or drugs: None 4. Primary Care Provider: He does not have a current primary care provider.   5. Psychiatry: He no longer has a psychiatrist.  6. Psychologist: He has not seen Dr. Celso Moran since 2018.   REVIEW OF SYSTEMS: There are no other significant problems involving Scott Moran's other body systems.   Objective:  Vital Signs:  BP 126/80   Ht 5' 7.52" (1.715 m)   Wt 154 lb 9.6 oz (70.1 kg)   BMI 23.84 kg/m     Ht Readings from Last 3 Encounters:  09/19/20 5' 7.52" (1.715 m)  08/13/19 5' 7.13" (1.705 m)  01/27/19 5' 7.17" (1.706 m)   Wt Readings from Last 3 Encounters:  09/19/20 154 lb 9.6 oz (70.1 kg)  06/15/20 153 lb 6.4 oz (69.6 kg)  03/16/20 151 lb 12.8 oz (68.9 kg)   PHYSICAL EXAM:  Constitutional: Scott Moran looked healthy and happy today. His weight has increased 1 pound since last visit. He was alert and bright. His affect was very upbeat and positive. He couldn't wait to tell me about Socrates, Plato, and Alcibiades. He was very enthused as he talked about ancient [place] philosophy and culture. His insight was good today.  Face: The face appears normal.  Eyes: There is no obvious arcus or proptosis. Moisture appears normal. Mouth: The oropharynx and tongue appear normal. Oral moisture is normal. Neck: The neck looks normal. No carotid bruits are noted. The thyroid gland is a bit smaller today at about 20+ grams. The consistency of the thyroid gland is normal today. The thyroid gland is not tender to palpation.  Lungs: The lungs are clear to auscultation. Air movement is good. Heart: Heart rate remains borderline tachycardic. Heart rhythm is regular. Heart sounds S1 and S2 are normal. I did not appreciate any pathologic cardiac murmurs. Abdomen: The abdomen is mildly enlarged. Bowel sounds are normal. There is no obvious hepatomegaly, splenomegaly, or other mass effect.  Arms: Muscle size and bulk are normal for age. Hands: There is no obvious tremor. Phalangeal and metacarpophalangeal joints are normal. Palmar muscles are normal. Palmar skin is normal. Palmar  moisture is also normal. Legs: Muscles appear normal for age. No edema is present. Feet: Feet are normally formed. Dorsalis pedal pulses are faint 1+ bilaterally.   Neurologic: Strength is normal for age in both the upper and lower extremities. Muscle tone is normal. Sensation to touch is normal in  both legs, but decreased in both heels.      LAB DATA:   Results for orders placed or performed in visit on 09/19/20 (from the past 504 hour(s))  POCT Glucose (Device for Home Use)   Collection Time: 09/19/20  2:53 PM  Result Value Ref Range   Glucose Fasting, POC     POC Glucose 146 (A) 70 - 99 mg/dl  POCT glycosylated hemoglobin (Hb A1C)   Collection Time: 09/19/20  2:54 PM  Result Value Ref Range   Hemoglobin A1C 8.3 (A) 4.0 - 5.6 %   HbA1c POC (<> result, manual entry)     HbA1c, POC (prediabetic range)     HbA1c, POC (controlled diabetic range)      Labs 09/19/20: HbA1c 8.3%, CBG 146  Labs 06/15/20: HbA1c 8.3%, CBG 209; TSH 2.42, free T4 1.2, free T3 2.8; CMP normal, except glucose 224, with sodium artifactually low at 134 and chloride artifactually low at 97; cholesterol 189, triglycerides 90, HDL 52, LDL 110; LH 2.4, FSH 6.7, testosterone 612; urinary microalbumin/creatinine ratio 11  Labs 03/16/20: HbA1c 8.9%, CBG 142  Labs 11/12/19: HbA1c 7.5%, CBG 222; HbA1c is lower, but at the cost of too many low BGs.  Labs 08/13/19: HbA1c 8.7%, CBG 264  Labs 07/15/19: TSH 1.90, free T4 1.2, free T3 2.8; CMP normal, except glucose 180; CBC normal; cholesterol 177, triglycerides 65, HDL 56, LDL 106; microalbumin/creatinine ratio 3; LH 3.5, FSH 7.7, testosterone 495 (ref 250-827), free testosterone 68.1 (ref 46-224)  Labs 04/28/19: HbA1c 7.7%, CBG 394; U/A positive glucose and moderate ketones  Labs 01/27/19: HbA1c 7.4%, CBG 231  Labs 10/27/18: HbA1c 8.0%, CBG 250  Labs 07/21/18: HbA1c 8.2%, CBG 166  Labs 07/15/18: TSH 1.54, free T4 1.2, free T3 3.5; CMP normal; cholesterol 176, triglycerides  74, HDL 53, LDL 107; urinary microalbumin/creatinine ratio 6  Labs 04/21/18: HbA1c 8.2%, CBG 295  Labs 01/20/18: HbA1c 8.4%, CBG 206  Labs 10/30/17: HbA1c 9.0%, CBG 147  Labs 08/13/17: CBG 354  Labs 06/04/17: CBG 173; BMP normal  Labs 05/29/17: HbA1c 8.2%; TSH 1.84, free T4 1.2, free T3 3.4, CMP normal except for glucose 310 and potassium 5.8; LH 3.7, FSH 5.9, testosterone 634, free testosterone 76 (ref 46-224), estradiol 31; microalbumin/creatinine ratio 4; cholesterol 187, triglycerides 63, HDL 59, LDL 116  Labs 04/03/17: CBG 182  Labs 01/31/17: HbA1c 7.6%, CBG 341  Labs 10/30/16: HbA1c 7.5%  Labs 07/25/16: HbA1c 7.8%  Labs 04/25/16: HbA1c 7.7%  Labs 04/09/16: LH 3.4, FSH 6.0, testosterone 504, free testosterone 85.5 (ref 47-244); cholesterol 176, triglycerides 92, HDL 80, LDL 88; CMP norma except for glucose 224; TSH 1.75, free T4 1.2, free T3 3.2; microalbumin/creatinine ratio 4  Labs 01/24/16: HbA1c 8.6%  Labs 10/20/15: HbA1c 8.1%  Labs 09/15/15: BMP normal except for glucose of 143 and calcium 8.3.  Labs 07/20/15: HbA1c 8.2%  Labs 03/25/15: HbA1c 7.8%  Labs 12/02/14: HbA1c 8.5%;  LH 4.0, FSH 8.2, testosterone 621.97; CMP normal; TSH 2.01, free T4 1.21, free T3 3.6; cholesterol 155, triglycerides 46, HDL  55, LDL 91; microalbumin/creatinine ratio 6.6  Labs 03/30/14: TSH 1.935, free T4 1.12, free T3 3.2; CMP: normal except for glucose 203; testosterone 624, FSH 6.3, LH 2.4; urine microalbumin/creatinine ratio 6.4; cholesterol 163, triglycerides 55, HDL 64, LDL 80  Labs 08/23/13: TSH 1.139; CMP normal except for a glucose of 145; CBC: WBC 5.4, Hgb 12.7, Hct 34.7%   Assessment and Plan:   ASSESSMENT:  1-2. Type 1 diabetes mellitus/hypoglycemia:  A. Scott Moran's HbA1c has continued to be 8.3%. He is not having many low BGs.    B. His is doing a bit better at changing sites. On some days he is better at checking BGs and giving himself insulin boluses, but on other days does not do well.    C. He continues to be very inconsistently inconsistent in taking care of his T1DM.  3. Autonomic neuropathy and inappropriate sinus tachycardia: These problems are better.  4. Peripheral neuropathy: This problem is evident again today.    5. Hypothyroid: He was euthyroid in December 2015, in May 2017, in June 2018, in August 2019, in August 2020, and in July 2021 on his current dose of Synthroid.  6. Hypogonadotropic hypogonadism: His testosterone level in May 2017 was acceptable, but was lower than in December 2015. His testosterone level in June 2018 was good, but his free testosterone was relatively low for his age.  His testosterone in August 2020 was within normal limits, but still relatively low for his age. His testosterone level in July 2021 was higher.   7. Hypertension: His DBP is high again today. He needs to take his lisinopril and exercise regularly.  8. Goiter/Hashimoto's disease: His goiter is less enlarged today. The pattern of waxing and waning of thyroid gland size is c/w evolving thyroiditis. The thyroiditis is clinically quiescent 9. Depression: This problem is much better today.  He says that he is taking the escitalopram reliably. Going back to school has also been a major positive factor for him. He does not exhibit any suicidal thoughts or emotions. I would like him to continue to see Dr. [name] and to re-establish care with a psychiatrist. Since he does not have a psychiatrist or a PCP to prescribe escitalopram, I have agreed to prescribe it for him until he can find a new psychiatrist or PCP. Unfortunately, he is not interested in finding another PCP or psychiatrist, so I have continued to prescribe the escitalopram for him.   10. Hypercholesterolemia: His cholesterol levels were elevated in August 2019, in August 2020, and again in 2021. I started him on pravastatin then. Exercise and more consistent BG control would help.  11. Noncompliance: He is doing somewhat better at taking  care of his DM as he has done at many other times in the past. I continue to hope that as Lew ages and matures, he will do a better job of taking care of his T1DM. At this time, however, he remains consistently inconsistent.      PLAN:  1. Diagnostic: I reviewed his BG printout. I ordered fasting lipids and CMP to be done prior to his next visit.  2. Therapeutic: Continue Synthroid, 25 mcg/day, escitalopram, 10 mg/day, and lisinopril 2.5 mg/day. Give both correction boluses and food boluses. We continued his current pump settings:  Basal rates:   MN: 0.70 5 AM: 0.70 8 AM: 0.650 Noon: 0.900 6 PM: 1.600 9 PM: 1.300  BG targets:  MN: 150 8 AM: 110 10 PM: 150  ISF: 65  ICRs: MN: 15 8 AM: 11 4:30 PM: 15 3. Patient education: Scott Moran is a very intelligent young man who knows intellectually what he should do and how to do it. Unfortunately, he continues to lack the commitment to be as consistent with his T1DM care as he needs to be. If he will start using the Guardian 3 sensor and take advantage of all the benefits of the Medtronic 670G pump and sensor combination, then his BG control  can be much better. 4. Follow-up: 3 months with me  Level of Service: This visit lasted in excess of 55  minutes. More than 50% of the visit was devoted to counseling.  [name] Sers, MD, CDE Adult and Pediatric Endocrinology 09/19/2020 3:18 PM

## 2020-09-19 ENCOUNTER — Other Ambulatory Visit: Payer: Self-pay

## 2020-09-19 ENCOUNTER — Ambulatory Visit (INDEPENDENT_AMBULATORY_CARE_PROVIDER_SITE_OTHER): Payer: BC Managed Care – PPO | Admitting: "Endocrinology

## 2020-09-19 ENCOUNTER — Encounter (INDEPENDENT_AMBULATORY_CARE_PROVIDER_SITE_OTHER): Payer: Self-pay | Admitting: "Endocrinology

## 2020-09-19 ENCOUNTER — Other Ambulatory Visit (INDEPENDENT_AMBULATORY_CARE_PROVIDER_SITE_OTHER): Payer: Self-pay

## 2020-09-19 VITALS — BP 126/80 | Ht 67.52 in | Wt 154.6 lb

## 2020-09-19 DIAGNOSIS — E1065 Type 1 diabetes mellitus with hyperglycemia: Secondary | ICD-10-CM | POA: Diagnosis not present

## 2020-09-19 DIAGNOSIS — E1042 Type 1 diabetes mellitus with diabetic polyneuropathy: Secondary | ICD-10-CM

## 2020-09-19 DIAGNOSIS — Z23 Encounter for immunization: Secondary | ICD-10-CM

## 2020-09-19 DIAGNOSIS — E1043 Type 1 diabetes mellitus with diabetic autonomic (poly)neuropathy: Secondary | ICD-10-CM | POA: Diagnosis not present

## 2020-09-19 DIAGNOSIS — E11649 Type 2 diabetes mellitus with hypoglycemia without coma: Secondary | ICD-10-CM

## 2020-09-19 DIAGNOSIS — I10 Essential (primary) hypertension: Secondary | ICD-10-CM

## 2020-09-19 DIAGNOSIS — E049 Nontoxic goiter, unspecified: Secondary | ICD-10-CM

## 2020-09-19 DIAGNOSIS — E7800 Pure hypercholesterolemia, unspecified: Secondary | ICD-10-CM

## 2020-09-19 DIAGNOSIS — E063 Autoimmune thyroiditis: Secondary | ICD-10-CM

## 2020-09-19 LAB — POCT GLUCOSE (DEVICE FOR HOME USE): POC Glucose: 146 mg/dl — AB (ref 70–99)

## 2020-09-19 LAB — POCT GLYCOSYLATED HEMOGLOBIN (HGB A1C): Hemoglobin A1C: 8.3 % — AB (ref 4.0–5.6)

## 2020-09-19 MED ORDER — GLUCAGON (RDNA) 1 MG IJ KIT: PACK | INTRAMUSCULAR | 3 refills | Status: DC

## 2020-09-19 NOTE — Patient Instructions (Signed)
Follow up visit in 3 months. Please have fasting blood tests done 1-2 weeks prior.

## 2020-10-20 ENCOUNTER — Other Ambulatory Visit (INDEPENDENT_AMBULATORY_CARE_PROVIDER_SITE_OTHER): Payer: Self-pay | Admitting: "Endocrinology

## 2020-10-20 DIAGNOSIS — E038 Other specified hypothyroidism: Secondary | ICD-10-CM

## 2020-11-24 ENCOUNTER — Other Ambulatory Visit (INDEPENDENT_AMBULATORY_CARE_PROVIDER_SITE_OTHER): Payer: Self-pay | Admitting: "Endocrinology

## 2020-11-24 DIAGNOSIS — F3289 Other specified depressive episodes: Secondary | ICD-10-CM

## 2020-12-21 ENCOUNTER — Other Ambulatory Visit (INDEPENDENT_AMBULATORY_CARE_PROVIDER_SITE_OTHER): Payer: Self-pay | Admitting: "Endocrinology

## 2020-12-22 ENCOUNTER — Ambulatory Visit (INDEPENDENT_AMBULATORY_CARE_PROVIDER_SITE_OTHER): Payer: BC Managed Care – PPO | Admitting: "Endocrinology

## 2021-01-29 NOTE — Progress Notes (Signed)
Subjective:  Patient Name: Scott Moran Date of Birth: 01/29/1991  MRN: [id number]  Scott Moran  presents to the office today for follow-up of his type 1 diabetes mellitus, goiter, hypoglycemia, depression, social anxiety disorder, avoidant personality disorder, hypothyroidism, thyroiditis, diabetic autonomic neuropathy, inappropriate sinus tachycardia, non-proliferative diabetic retinopathy, diabetic peripheral neuropathy, fatigue, hypogonadotropic hypogonadism, and medical non-compliance.  HISTORY OF PRESENT ILLNESS:   Scott Moran is a 30 y.o. Caucasian young man. Scott Moran was unaccompanied.   1. I first saw the patient in consultation on 03/27/05 in the [facility] in [place], [place]. He had been referred by his primary care provider, [name], in [place], [place], for evaluation and treatment of poorly controlled type 1 diabetes. The patient was 30 years old.  A. The patient had been diagnosed with type 1 diabetes in February of 1995 at age 72. He was on an insulin pump when I first met him. His hemoglobin A1c had increased from 7.1% in December of 2005 to 8.2% in April 2006. He was having frequent snacks that he was not taking boluses for. He was also frequently noncompliant with checking blood sugars and taking meal boluses. I adjusted his basal rates.  B. At his next [facility] visit on 06/19/05, he was still very noncompliant. His mental status was normal. He had a 25 gram goiter. His father was serving in [place] with the [employer]. There was a lot of stress at home. I again adjusted his basal rates.  C. At the end of July 2006 we closed our satellite office in [place]. The patient elected to be followed at our clinic here in [place]. I have followed him here ever since.  2. During the past sixteen years, the patient has had several clinical issues.   A. T1DM:    1). The patient's blood glucose control has usually been fairly good  for a teenager/young adult. His maximum hemoglobin A1c occurred on 04/16/06 when the hemoglobin A1c was 12.1%. Since then the hemoglobin A1c values have varied between 6.9% and 8.6%, until his HbA1c on 10/30/17 of 9.0%. Since then his HbA1c has varied from 7.4% to 8.4%.    2). He is still often noncompliant with checking blood sugars, bolusing, and changing his insulin pump sites on time, but he is more compliant and more careful than he used to be. He has never required hospital re-admission for DKA, severe hyperglycemia, or hypoglycemia.   3). He started his new Medtronic 670G insulin pump and Guardian 3 sensor in September 2017, but later stopped using the sensor and has refused to resume using it.    B. Goiter, Hashimoto's thyroiditis, and hypothyroidism: In late 2008 and early 2009 the patient developed hypothyroidism secondary to Hashimoto's disease. I started him on Synthroid, 25 mcg per day. When he takes his medication, he is euthyroid.  C. Autonomic neuropathy and inappropriate sinus tachycardia: In late 2008 he developed autonomic neuropathy which manifested as inappropriate sinus tachycardia. These problems have varied in parallel with his BGs and HbA1c values.  D. Diabetic peripheral neuropathy: The patient has had symptoms and signs of mild diabetic peripheral neuropathy at several times in the past. His peripheral neuropathic signs and symptoms have similarly varied in parallel with his BGs and HbA1c values.   E. Non-proliferative diabetic retinopathy: This problem was identified during a diabetic eye exam in June 2010.  F. Hypogonadotropic hypogonadism: The patient has always been somewhat lackadaisical and relatively passive. On 06/29/10 his [place] was 4.9, LH  was 2.4, testosterone was 399.66, and free testosterone was 79.8. The testosterone and free testosterone were relatively low for an 30 year old young man. At the same time the [facility] and LH while "normal", appeared to be actually  inappropriately low for the level of testosterone he had. We discussed the options for treatment including exercise and medication, to include AndroGel. The patient chose the exercise option. By 10/19/10 the [facility] was 6.8, the LH was 3.2, total testosterone was 615.88 and the free testosterone was 131.6. In retrospect, when he was more depressed he may have had less gonadotropic function.   G. Depression/Anxiety/Personality disorders:    1). The patient has been diagnosed with depression at different times in the past. For some time he took Remeron (mirtazapine), but discontinued that medication in early 2012. I had been trying to convince him for some time that he was depressed and that he should seek out help. He eventually did decide to seek psychiatric help.    2). On 06/08/13 Scott Moran was evaluated by [name], a noted psychiatrist in [place].  [name] and his staff performed neuropsychiatric testing. On the [facility] had problems with non-verbal reasoning and emotional intelligence. 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 [name] prescribed escitalopram, 10 mg/day. Scott Moran was supposed to continue to see [name] in follow up. Unfortunately, Scott Moran did not follow up.    3). Scott Moran was then referred to a therapist, [name]. Scott Moran enjoyed working with Dr. [name] in the past and felt that he had made progress over time. Unfortunately, Scott Moran has not been back to see Dr. [name] since the summer of 2018. I have been prescribing Scott Moran's escitalopram ever since.    H. 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   I. He had his second [place] covid vaccination in May 2021.  3. The patient's last [facility] visit was on 09/19/20. At that visit I continued his pump settings. I also  continued his Synthroid, escitalopram, pravastatin, and lisinopril doses.  A. In the interim he has been healthy, but has had some URI/allergy symptoms this week.   He is not getting much exercise.  B. He graduated from [employer] with an associate's degree. He does not have any plans to continue in college or to seek employment.  C. He is still not using his Guardian 3 sensor. He has his new Medtronic 780 pump, but has not started it yet.    Juanda Bond has not seen Dr. [name], his therapist, due to conflicts with his academic schedule and lack of interest.   E. Rithvik takes his Synthroid dose of 25 mcg/day every night. He also takes escitalopram 10 mg, lisinopril 2.5 mg daily, and pravastatin 10 mg.    F. He has his learner's permit, but has not wanted to try driving. He is severely afraid of other drivers that are "terrible".   4. Pertinent Review of Systems:  Constitutional: Scott Moran feels "not so good emotionally considering what's going on in [place]".  Eyes: Vision is good with his glasses. There are no significant eye complaints. Last eye exam was in July 2021. Everything looked fine.  Mouth: No problems. He had a dental exam last week. "Everything looked fine."  Neck: Scott Moran has no complaints of anterior neck swelling, soreness, tenderness,  pressure, discomfort, or difficulty swallowing.  Heart: Heart rate  is faster today. The patient has no complaints of palpitations, irregular heat beats, chest pain, or chest pressure. Gastrointestinal: No postprandial bloating. Bowel movents seem normal. The patient has no other complaints of excessive hunger, acid reflux, upset stomach, stomach aches or pains, diarrhea, or constipation. Hands: He plays video games well.  Legs: Muscle mass and strength seem normal. There are no complaints of numbness, tingling, burning, or pain. No edema is noted. Feet: There are no obvious foot problems. There are no complaints of numbness, tingling, burning, or pain. No edema  is noted. Hypoglycemia: He has had "a few" low BGs recently.  Emotional/psychological: The patient feels "stressed out by the news".  Diabetes identification: He has his pump and a wallet card.  .  5. BG printout: We have data for the past 2 weeks. He is changing his sites every 3-5 days. He is checking BGs 0-5 times per day, for an average of 2.7 times per day, compared with 2.6 times per day at his last visit. He checked BGs 38 times in 14 days. He bolused 3-6 times per day, with more of the boluses both correction doses and food boluses. Average BG is 274, compared with 232 at his last visit and with 203 at his prior visit. BG range is 72->400, compared with 56->400 at his last visit and with 28-389 at his prior visit. The more often he changes his sites, checks his BGs, and boluses, the better his BGs are.    PAST MEDICAL, FAMILY, AND SOCIAL HISTORY: Past Medical History:  Diagnosis Date  . DM type 1 with diabetic peripheral neuropathy (HCC)   . Goiter   . Hypertension   . Hypoglycemia associated with diabetes (HCC)   . Hypogonadotropic hypogonadism (HCC)   . Hypothyroidism, acquired, autoimmune   . Tachycardia   . Thyroiditis, autoimmune   . Type 1 diabetes mellitus not at goal [facility])   . Type 1 diabetes mellitus with diabetic autonomic neuropathy (HCC)     Family History  Problem Relation Age of Onset  . Diabetes Maternal Uncle   . Hypertension Maternal Grandmother   . Thyroid disease Neg Hx   . Obesity Neg Hx   . Kidney disease Neg Hx   . Cancer Neg Hx   . Anemia Neg Hx      Current Outpatient Medications:  .  escitalopram (LEXAPRO) 10 MG tablet, TAKE 1 TABLET BY MOUTH ONCE DAILY, Disp: 30 tablet, Rfl: 4 .  glucagon 1 MG injection, Inject 1 mg intramuscularly in thigh 1 time for severe hypoglycemia if unresponsive, unconscious, unable to swallow or has a seizure., Disp: 2 kit, Rfl: 3 .  insulin aspart (NOVOLOG) 100 UNIT/ML injection, INJECT 300 UNITS INTO PUMP EVERY 48-72  HOURS AND PER PROTOCOL FOR HYPERGLYCEMIA, Disp: 40 mL, Rfl: 5 .  levothyroxine (SYNTHROID) 25 MCG tablet, TAKE 1 TABLET BY MOUTH DAILY BEFORE BREAKFAST, Disp: 30 tablet, Rfl: 5 .  lisinopril (ZESTRIL) 2.5 MG tablet, TAKE 1 TABLET BY MOUTH ONCE DAILY, Disp: 30 tablet, Rfl: 5 .  pravastatin (PRAVACHOL) 10 MG tablet, Take 1 tablet (10 mg total) by mouth every evening., Disp: 90 tablet, Rfl: 1 .  acetone, urine, test strip, Check ketones per protocol (Patient not taking: Reported on 03/16/2020), Disp: 50 each, Rfl: 3 .  glucose blood (BAYER CONTOUR NEXT TEST) test strip, Check sugar 10 x daily (Patient not taking: Reported on 06/15/2020), Disp: 300 each, Rfl: 3 .  insulin lispro (HUMALOG) 100 UNIT/ML injection, Up to 300 units of  insulin in insulin pump every 48-72 hours pre DKA and Hyperglycemia protocols (Patient not taking: Reported on 08/13/2019), Disp: 40 mL, Rfl: 5 .  ondansetron (ZOFRAN ODT) 4 MG disintegrating tablet, Take 1 tablet (4 mg total) by mouth every 8 (eight) hours as needed for nausea or vomiting. (Patient not taking: Reported on 08/13/2019), Disp: 20 tablet, Rfl: 0 .  promethazine (PHENERGAN) 25 MG tablet, Take 1 tablet (25 mg total) by mouth every 6 (six) hours as needed for nausea or vomiting. (Patient not taking: Reported on 08/13/2019), Disp: 20 tablet, Rfl: 0  Allergies as of 01/30/2021  . (No Known Allergies)    1. Work and Family: Scott Moran is spending a lot of time on the web.  2. Activities: Few physical activities. He is a history and politics buff. He frequently participates in online forum discussions about historical and political topics. He is still thinking about becoming a history Runner, broadcasting/film/video or a Psychologist, occupational.  3. Smoking, alcohol, or drugs: None 4. Primary Care Provider: He does not have a current primary care provider.  5. Psychiatry: He no longer has a psychiatrist.  6. Psychologist: He has not seen Dr. Jerlyn Ly since 2018.   REVIEW OF SYSTEMS: There are no other  significant problems involving Le's other body systems.   Objective:  Vital Signs:  BP 114/80   Pulse 88   Wt 158 lb (71.7 kg)   BMI 24.37 kg/m     Ht Readings from Last 3 Encounters:  09/19/20 5' 7.52" (1.715 m)  08/13/19 5' 7.13" (1.705 m)  01/27/19 5' 7.17" (1.706 m)   Wt Readings from Last 3 Encounters:  01/30/21 158 lb (71.7 kg)  09/19/20 154 lb 9.6 oz (70.1 kg)  06/15/20 153 lb 6.4 oz (69.6 kg)   PHYSICAL EXAM:  Constitutional: Scott Moran looked healthy and happy today. His weight has increased 4 pounds since last visit. He was alert and bright. His affect was very upbeat and positive. He couldn't wait to talk about the [place] situation. His insight was good today.  Face: The face appears normal.  Eyes: There is no obvious arcus or proptosis. Moisture appears normal. Mouth: The oropharynx and tongue appear normal. Oral moisture is normal. Neck: The neck looks normal. No carotid bruits are noted. The thyroid gland is normal in size at about 20 grams. The consistency of the thyroid gland is normal today. The thyroid gland is not tender to palpation.  Lungs: The lungs are clear to auscultation. Air movement is good. Heart: Heart rate remains borderline tachycardic. Heart rhythm is regular. Heart sounds S1 and S2 are normal. I did not appreciate any pathologic cardiac murmurs. Abdomen: The abdomen is more enlarged. Bowel sounds are normal. There is no obvious hepatomegaly, splenomegaly, or other mass effect.  Arms: Muscle size and bulk are normal for age. Hands: There is no obvious tremor. Phalangeal and metacarpophalangeal joints are normal. Palmar muscles are normal. Palmar skin is normal. Palmar moisture is also normal. Legs: Muscles appear normal for age. No edema is present. Feet: Feet are normally formed. Dorsalis pedal pulses are 1+ on the left and 2+ on the right.    Neurologic: Strength is normal for age in both the upper and lower extremities. Muscle tone is normal.  Sensation to touch is normal in both legs, but decreased in both heels.      LAB DATA:   Results for orders placed or performed in visit on 01/30/21 (from the past 504 hour(s))  POCT Glucose (Device for Home  Use)   Collection Time: 01/30/21 10:45 AM  Result Value Ref Range   Glucose Fasting, POC     POC Glucose 198 (A) 70 - 99 mg/dl  POCT glycosylated hemoglobin (Hb A1C)   Collection Time: 01/30/21 10:49 AM  Result Value Ref Range   Hemoglobin A1C 8.3 (A) 4.0 - 5.6 %   HbA1c POC (<> result, manual entry)     HbA1c, POC (prediabetic range)     HbA1c, POC (controlled diabetic range)       Labs 01/30/21: HbA1c 8.3%, CBG 198  Labs 09/19/20: HbA1c 8.3%, CBG 146  Labs 06/15/20: HbA1c 8.3%, CBG 209; TSH 2.42, free T4 1.2, free T3 2.8; CMP normal, except glucose 224, with sodium artifactually low at 134 and chloride artifactually low at 97; cholesterol 189, triglycerides 90, HDL 52, LDL 110; LH 2.4, FSH 6.7, testosterone 612; urinary microalbumin/creatinine ratio 11  Labs 03/16/20: HbA1c 8.9%, CBG 142  Labs 11/12/19: HbA1c 7.5%, CBG 222; HbA1c is lower, but at the cost of too many low BGs.  Labs 08/13/19: HbA1c 8.7%, CBG 264  Labs 07/15/19: TSH 1.90, free T4 1.2, free T3 2.8; CMP normal, except glucose 180; CBC normal; cholesterol 177, triglycerides 65, HDL 56, LDL 106; microalbumin/creatinine ratio 3; LH 3.5, FSH 7.7, testosterone 495 (ref 250-827), free testosterone 68.1 (ref 46-224)  Labs 04/28/19: HbA1c 7.7%, CBG 394; U/A positive glucose and moderate ketones  Labs 01/27/19: HbA1c 7.4%, CBG 231  Labs 10/27/18: HbA1c 8.0%, CBG 250  Labs 07/21/18: HbA1c 8.2%, CBG 166  Labs 07/15/18: TSH 1.54, free T4 1.2, free T3 3.5; CMP normal; cholesterol 176, triglycerides 74, HDL 53, LDL 107; urinary microalbumin/creatinine ratio 6  Labs 04/21/18: HbA1c 8.2%, CBG 295  Labs 01/20/18: HbA1c 8.4%, CBG 206  Labs 10/30/17: HbA1c 9.0%, CBG 147  Labs 08/13/17: CBG 354  Labs 06/04/17: CBG 173; BMP  normal  Labs 05/29/17: HbA1c 8.2%; TSH 1.84, free T4 1.2, free T3 3.4, CMP normal except for glucose 310 and potassium 5.8; LH 3.7, FSH 5.9, testosterone 634, free testosterone 76 (ref 46-224), estradiol 31; microalbumin/creatinine ratio 4; cholesterol 187, triglycerides 63, HDL 59, LDL 116  Labs 04/03/17: CBG 182  Labs 01/31/17: HbA1c 7.6%, CBG 341  Labs 10/30/16: HbA1c 7.5%  Labs 07/25/16: HbA1c 7.8%  Labs 04/25/16: HbA1c 7.7%  Labs 04/09/16: LH 3.4, FSH 6.0, testosterone 504, free testosterone 85.5 (ref 47-244); cholesterol 176, triglycerides 92, HDL 80, LDL 88; CMP norma except for glucose 224; TSH 1.75, free T4 1.2, free T3 3.2; microalbumin/creatinine ratio 4  Labs 01/24/16: HbA1c 8.6%  Labs 10/20/15: HbA1c 8.1%  Labs 09/15/15: BMP normal except for glucose of 143 and calcium 8.3.  Labs 07/20/15: HbA1c 8.2%  Labs 03/25/15: HbA1c 7.8%  Labs 12/02/14: HbA1c 8.5%;  LH 4.0, FSH 8.2, testosterone 621.97; CMP normal; TSH 2.01, free T4 1.21, free T3 3.6; cholesterol 155, triglycerides 46, HDL  55, LDL 91; microalbumin/creatinine ratio 6.6  Labs 03/30/14: TSH 1.935, free T4 1.12, free T3 3.2; CMP: normal except for glucose 203; testosterone 624, FSH 6.3, LH 2.4; urine microalbumin/creatinine ratio 6.4; cholesterol 163, triglycerides 55, HDL 64, LDL 80  Labs 08/23/13: TSH 1.139; CMP normal except for a glucose of 145; CBC: WBC 5.4, Hgb 12.7, Hct 34.7%   Assessment and Plan:   ASSESSMENT:  1-2. Type 1 diabetes mellitus/hypoglycemia:  A. Scott Moran's HbA1c has continued to be 8.3%. He is not having any documented low BGs.    B. His is doing a bit better at changing sites and a  lot better at checking BGs and bolusing. On some days he is better at checking BGs and giving himself insulin boluses, but on other days does not do well.   C. He continues to be inconsistently inconsistent in taking care of his T1DM.  3. Autonomic neuropathy and inappropriate sinus tachycardia: These problems are better.   4. Peripheral neuropathy: This problem is evident again today, but is mild.    5. Hypothyroid: He was euthyroid in December 2015, in May 2017, in June 2018, in August 2019, in August 2020, and in July 2021 on his current dose of Synthroid.  6. Hypogonadotropic hypogonadism: His testosterone level in May 2017 was acceptable, but was lower than in December 2015. His testosterone level in June 2018 was good, but his free testosterone was relatively low for his age.  His testosterone in August 2020 was within normal limits, but still relatively low for his age. His testosterone level in July 2021 was higher.   7. Hypertension: His DBP is high again today. He needs to take his lisinopril and exercise regularly.  8. Goiter/Hashimoto's disease: His goiter is smaller and is now normal in size. The pattern of waxing and waning of thyroid gland size is c/w evolving thyroiditis. The thyroiditis is clinically quiescent 9. Depression: This problem is much better today.  He says that he is taking the escitalopram reliably. Going back to school has also been a major positive factor for him. He does not exhibit any suicidal thoughts or emotions. I would like to see him stay in college. I would like him to continue to see Dr. [name] and to re-establish care with a psychiatrist. Since he does not have a psychiatrist or a PCP to prescribe escitalopram, I have agreed to prescribe it for him until he can find a new psychiatrist or PCP. Unfortunately, he is not interested in finding another PCP or psychiatrist, so I have continued to prescribe the escitalopram for him.   10. Hypercholesterolemia: His cholesterol levels were elevated in August 2019, in August 2020, and again in 2021. I started him on pravastatin then. Exercise and more consistent BG control would help. Since he did not have lab tests done prior to today's visit, we will get them done soon.  11. Noncompliance: He is doing somewhat better at taking care of his DM as  he has done at many other times in the past. I continue to hope that as Scott Moran ages and matures, he will do a better job of taking care of his T1DM. At this time, however, he remains consistently inconsistent.      PLAN:  1. Diagnostic: I reviewed his BG printout. I ordered fasting TFTS, lipids, and CMP to be done soon.  2. Therapeutic: Continue Synthroid, 25 mcg/day, escitalopram, 10 mg/day, lisinopril 2.5 mg/day, and pravastatin,10 mg/day. Give both correction boluses and food boluses. We changes his current pump settings:  New Basal rates:   MN: 0.70 -> 0.75 5 AM: 0.70 -> 0.75 8 AM: 0.650 -> 0.70 Noon: 0.900 -> 0.950 6 PM: 1.600 -> 1.65 9 PM: 1.300 -> 1.35  Continue BG targets:  MN: 150 8 AM: 110 10 PM: 150  Continue ISF: 65  Continue ICRs: MN: 15 8 AM: 11 4:30 PM: 15 3. Patient education: Jaris is a very intelligent young man who knows intellectually what he should do and how to do it. Unfortunately, he still lacks the commitment to be as consistent with his T1DM care as he needs to be. If he  will start using the Guardian 3 sensor and take advantage of all the benefits of the Medtronic 670G pump and sensor combination, then his BG control can be much better. 4. Follow-up: 3 months with me  Level of Service: This visit lasted in excess of 60  minutes. More than 50% of the visit was devoted to counseling.  [name] Sers, MD, CDE Adult and Pediatric Endocrinology 01/30/2021 11:41 AM

## 2021-01-30 ENCOUNTER — Ambulatory Visit (INDEPENDENT_AMBULATORY_CARE_PROVIDER_SITE_OTHER): Payer: BC Managed Care – PPO | Admitting: "Endocrinology

## 2021-01-30 ENCOUNTER — Other Ambulatory Visit (INDEPENDENT_AMBULATORY_CARE_PROVIDER_SITE_OTHER): Payer: Self-pay | Admitting: "Endocrinology

## 2021-01-30 ENCOUNTER — Other Ambulatory Visit: Payer: Self-pay

## 2021-01-30 ENCOUNTER — Encounter (INDEPENDENT_AMBULATORY_CARE_PROVIDER_SITE_OTHER): Payer: Self-pay | Admitting: "Endocrinology

## 2021-01-30 VITALS — BP 114/80 | HR 88 | Wt 158.0 lb

## 2021-01-30 DIAGNOSIS — E063 Autoimmune thyroiditis: Secondary | ICD-10-CM

## 2021-01-30 DIAGNOSIS — E11649 Type 2 diabetes mellitus with hypoglycemia without coma: Secondary | ICD-10-CM

## 2021-01-30 DIAGNOSIS — E049 Nontoxic goiter, unspecified: Secondary | ICD-10-CM | POA: Diagnosis not present

## 2021-01-30 DIAGNOSIS — E1065 Type 1 diabetes mellitus with hyperglycemia: Secondary | ICD-10-CM

## 2021-01-30 DIAGNOSIS — E1043 Type 1 diabetes mellitus with diabetic autonomic (poly)neuropathy: Secondary | ICD-10-CM

## 2021-01-30 DIAGNOSIS — Z91199 Patient's noncompliance with other medical treatment and regimen due to unspecified reason: Secondary | ICD-10-CM

## 2021-01-30 DIAGNOSIS — E7800 Pure hypercholesterolemia, unspecified: Secondary | ICD-10-CM

## 2021-01-30 DIAGNOSIS — R000 Tachycardia, unspecified: Secondary | ICD-10-CM

## 2021-01-30 DIAGNOSIS — Z9119 Patient's noncompliance with other medical treatment and regimen: Secondary | ICD-10-CM

## 2021-01-30 DIAGNOSIS — I10 Essential (primary) hypertension: Secondary | ICD-10-CM

## 2021-01-30 DIAGNOSIS — E1042 Type 1 diabetes mellitus with diabetic polyneuropathy: Secondary | ICD-10-CM

## 2021-01-30 LAB — POCT GLYCOSYLATED HEMOGLOBIN (HGB A1C): Hemoglobin A1C: 8.3 % — AB (ref 4.0–5.6)

## 2021-01-30 LAB — POCT GLUCOSE (DEVICE FOR HOME USE): POC Glucose: 198 mg/dl — AB (ref 70–99)

## 2021-01-30 NOTE — Patient Instructions (Signed)
Follow up visit in 3 months. 

## 2021-03-28 ENCOUNTER — Other Ambulatory Visit (INDEPENDENT_AMBULATORY_CARE_PROVIDER_SITE_OTHER): Payer: Self-pay | Admitting: "Endocrinology

## 2021-03-28 DIAGNOSIS — E038 Other specified hypothyroidism: Secondary | ICD-10-CM

## 2021-05-05 DIAGNOSIS — E7800 Pure hypercholesterolemia, unspecified: Secondary | ICD-10-CM | POA: Diagnosis not present

## 2021-05-05 DIAGNOSIS — E1065 Type 1 diabetes mellitus with hyperglycemia: Secondary | ICD-10-CM | POA: Diagnosis not present

## 2021-05-05 DIAGNOSIS — E063 Autoimmune thyroiditis: Secondary | ICD-10-CM | POA: Diagnosis not present

## 2021-05-06 LAB — COMPREHENSIVE METABOLIC PANEL
AG Ratio: 1.5 (calc) (ref 1.0–2.5)
ALT: 14 U/L (ref 9–46)
AST: 14 U/L (ref 10–40)
Albumin: 4.8 g/dL (ref 3.6–5.1)
Alkaline phosphatase (APISO): 81 U/L (ref 36–130)
BUN: 9 mg/dL (ref 7–25)
CO2: 29 mmol/L (ref 20–32)
Calcium: 9.8 mg/dL (ref 8.6–10.3)
Chloride: 99 mmol/L (ref 98–110)
Creat: 0.84 mg/dL (ref 0.60–1.35)
Globulin: 3.2 g/dL (calc) (ref 1.9–3.7)
Glucose, Bld: 165 mg/dL — ABNORMAL HIGH (ref 65–139)
Potassium: 3.9 mmol/L (ref 3.5–5.3)
Sodium: 138 mmol/L (ref 135–146)
Total Bilirubin: 0.8 mg/dL (ref 0.2–1.2)
Total Protein: 8 g/dL (ref 6.1–8.1)

## 2021-05-06 LAB — LIPID PANEL
Cholesterol: 208 mg/dL — ABNORMAL HIGH (ref <200)
HDL: 51 mg/dL (ref >=40)
LDL Cholesterol (Calc): 139 mg/dL (calc) — ABNORMAL HIGH
Non-HDL Cholesterol (Calc): 157 mg/dL (calc) — ABNORMAL HIGH (ref <130)
Total CHOL/HDL Ratio: 4.1 (calc) (ref <5.0)
Triglycerides: 83 mg/dL (ref <150)

## 2021-05-06 LAB — T4, FREE: Free T4: 1.4 ng/dL (ref 0.8–1.8)

## 2021-05-06 LAB — TSH: TSH: 2.8 mIU/L (ref 0.40–4.50)

## 2021-05-06 LAB — T3, FREE: T3, Free: 3.5 pg/mL (ref 2.3–4.2)

## 2021-05-10 ENCOUNTER — Other Ambulatory Visit: Payer: Self-pay

## 2021-05-10 ENCOUNTER — Encounter (INDEPENDENT_AMBULATORY_CARE_PROVIDER_SITE_OTHER): Payer: Self-pay | Admitting: "Endocrinology

## 2021-05-10 ENCOUNTER — Ambulatory Visit (INDEPENDENT_AMBULATORY_CARE_PROVIDER_SITE_OTHER): Payer: BC Managed Care – PPO | Admitting: "Endocrinology

## 2021-05-10 VITALS — BP 108/64 | HR 70 | Wt 161.4 lb

## 2021-05-10 DIAGNOSIS — E7800 Pure hypercholesterolemia, unspecified: Secondary | ICD-10-CM

## 2021-05-10 DIAGNOSIS — E049 Nontoxic goiter, unspecified: Secondary | ICD-10-CM

## 2021-05-10 DIAGNOSIS — E10649 Type 1 diabetes mellitus with hypoglycemia without coma: Secondary | ICD-10-CM | POA: Diagnosis not present

## 2021-05-10 DIAGNOSIS — E1065 Type 1 diabetes mellitus with hyperglycemia: Secondary | ICD-10-CM | POA: Diagnosis not present

## 2021-05-10 DIAGNOSIS — Z91199 Patient's noncompliance with other medical treatment and regimen due to unspecified reason: Secondary | ICD-10-CM

## 2021-05-10 DIAGNOSIS — F320 Major depressive disorder, single episode, mild: Secondary | ICD-10-CM

## 2021-05-10 DIAGNOSIS — Z9119 Patient's noncompliance with other medical treatment and regimen: Secondary | ICD-10-CM

## 2021-05-10 DIAGNOSIS — I10 Essential (primary) hypertension: Secondary | ICD-10-CM

## 2021-05-10 DIAGNOSIS — E063 Autoimmune thyroiditis: Secondary | ICD-10-CM

## 2021-05-10 DIAGNOSIS — E1043 Type 1 diabetes mellitus with diabetic autonomic (poly)neuropathy: Secondary | ICD-10-CM

## 2021-05-10 DIAGNOSIS — E1042 Type 1 diabetes mellitus with diabetic polyneuropathy: Secondary | ICD-10-CM

## 2021-05-10 LAB — POCT GLUCOSE (DEVICE FOR HOME USE): Glucose Fasting, POC: 192 mg/dL — AB (ref 70–99)

## 2021-05-10 LAB — POCT GLYCOSYLATED HEMOGLOBIN (HGB A1C): Hemoglobin A1C: 8.9 % — AB (ref 4.0–5.6)

## 2021-05-10 MED ORDER — PRAVASTATIN SODIUM 20 MG PO TABS: ORAL_TABLET | ORAL | 11 refills | Status: DC

## 2021-05-10 NOTE — Progress Notes (Signed)
Subjective:  Patient Name: Scott Moran Date of Birth: 01/29/1991  MRN: [id number]  Scott Moran  presents to the office today for follow-up of his type 1 diabetes mellitus, goiter, hypoglycemia, depression, social anxiety disorder, avoidant personality disorder, hypothyroidism, thyroiditis, diabetic autonomic neuropathy, inappropriate sinus tachycardia, non-proliferative diabetic retinopathy, diabetic peripheral neuropathy, fatigue, hypogonadotropic hypogonadism, and medical non-compliance.  HISTORY OF PRESENT ILLNESS:   Scott Moran is a 30 y.o. Caucasian young man. Scott Moran was unaccompanied.   1. I first saw the patient in consultation on 03/27/05 in the [facility] in [place], [place]. He had been referred by his primary care provider, Dr. Wendie Moran, in [place], [place], for evaluation and treatment of poorly controlled type 1 diabetes. The patient was 30 years old.  A. The patient had been diagnosed with type 1 diabetes in February of 1995 at age 72. He was on an insulin pump when I first met him. His hemoglobin A1c had increased from 7.1% in December of 2005 to 8.2% in April 2006. He was having frequent snacks that he was not taking boluses for. He was also frequently noncompliant with checking blood sugars and taking meal boluses. I adjusted his basal rates.  B. At his next [facility] visit on 06/19/05, he was still very noncompliant. His mental status was normal. He had a 25 gram goiter. His father was serving in [place] with the [employer]. There was a lot of stress at home. I again adjusted his basal rates.  C. At the end of July 2006 we closed our satellite office in [place]. The patient elected to be followed at our clinic here in [place]. I have followed him here ever since.  2. During the past sixteen years, the patient has had several clinical issues.   A. T1DM:    1). The patient's blood glucose control has usually been fairly good  for a teenager/young adult. His maximum hemoglobin A1c occurred on 04/16/06 when the hemoglobin A1c was 12.1%. Since then the hemoglobin A1c values have varied between 6.9% and 8.6%, until his HbA1c on 10/30/17 of 9.0%. Since then his HbA1c has varied from 7.4% to 8.4%.    2). He is still often noncompliant with checking blood sugars, bolusing, and changing his insulin pump sites on time, but he is more compliant and more careful than he used to be. He has never required hospital re-admission for DKA, severe hyperglycemia, or hypoglycemia.   3). He started his new Medtronic 670G insulin pump and Guardian 3 sensor in September 2017, but later stopped using the sensor and has refused to resume using it.    B. Goiter, Hashimoto's thyroiditis, and hypothyroidism: In late 2008 and early 2009 the patient developed hypothyroidism secondary to Hashimoto's disease. I started him on Synthroid, 25 mcg per day. When he takes his medication, he is euthyroid.  C. Autonomic neuropathy and inappropriate sinus tachycardia: In late 2008 he developed autonomic neuropathy which manifested as inappropriate sinus tachycardia. These problems have varied in parallel with his BGs and HbA1c values.  D. Diabetic peripheral neuropathy: The patient has had symptoms and signs of mild diabetic peripheral neuropathy at several times in the past. His peripheral neuropathic signs and symptoms have similarly varied in parallel with his BGs and HbA1c values.   E. Non-proliferative diabetic retinopathy: This problem was identified during a diabetic eye exam in June 2010.  F. Hypogonadotropic hypogonadism: The patient has always been somewhat lackadaisical and relatively passive. On 06/29/10 his [place] was 4.9, LH  was 2.4, testosterone was 399.66, and free testosterone was 79.8. The testosterone and free testosterone were relatively low for an 30 year old young man. At the same time the [facility] and LH while "normal", appeared to be actually  inappropriately low for the level of testosterone he had. We discussed the options for treatment including exercise and medication, to include AndroGel. The patient chose the exercise option. By 10/19/10 the [facility] was 6.8, the LH was 3.2, total testosterone was 615.88 and the free testosterone was 131.6. In retrospect, when he was more depressed he may have had less gonadotropic function.   G. Depression/Anxiety/Personality disorders:    1). The patient has been diagnosed with depression at different times in the past. For some time he took Remeron (mirtazapine), but discontinued that medication in early 2012. I had been trying to convince him for some time that he was depressed and that he should seek out help. He eventually did decide to seek psychiatric help.    2). On 06/08/13 Scott Moran was evaluated by Dr. Carlena Moran, a noted psychiatrist in [place].  Dr. [name] and his staff performed neuropsychiatric testing. On the Scott Moran had problems with non-verbal reasoning and emotional intelligence. Dr. 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 Dr. [name] prescribed escitalopram, 10 mg/day. Scott Moran was supposed to continue to see Dr. [name] in follow up. Unfortunately, Scott Moran did not follow up.    3). Scott Moran was then referred to a therapist, Dr. Celso Moran. Scott Moran enjoyed working with Dr. [name] in the past and felt that he had made progress over time. Unfortunately, Scott Moran has not been back to see Dr. [name] since the summer of 2018. I have been prescribing Scott Moran's escitalopram ever since.    H. 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   I. He had his second Scott Moran covid vaccination in May 2021.  3. The patient's last [facility] visit was on 228/22. At that visit I increased all of his basal rates. I also  continued his Synthroid, escitalopram, pravastatin, and lisinopril doses.  A. In the interim he has been healthy, but has had some URI/allergy symptoms at times. He is not getting much exercise.  B. He graduated from [employer] with an associate's degree. He does not have any immediate plans to continue in college or to seek employment.  C. He is still not using his Guardian 3 sensor. He has been using his Medtronic 770 pump.    Juanda Bond has not seen Dr. [name], his therapist, due to conflicts with his academic schedule and lack of interest.   E. Scott Moran takes his Synthroid dose of 25 mcg/day every night. He also takes escitalopram 10 mg, lisinopril 2.5 mg daily, and pravastatin 10 mg.    F. He has his learner's permit, but has not wanted to try driving. He is severely afraid of other drivers that are "terrible".   4. Pertinent Review of Systems:  Constitutional: Scott Moran feels "pretty good overall".  Eyes: Vision is good with his glasses. There are no significant eye complaints. Last eye exam was in July 2021. Everything looked fine.  Mouth: No problems. He had a dental exam in February 2022. "Everything looked fine."  Neck: Scott Moran has no complaints of anterior neck swelling, soreness, tenderness,  pressure, discomfort, or difficulty swallowing.  Heart: Heart rate is faster today. The patient has no complaints of palpitations,  irregular heat beats, chest pain, or chest pressure. Gastrointestinal: No postprandial bloating. Bowel movents seem normal. The patient has no other complaints of excessive hunger, acid reflux, upset stomach, stomach aches or pains, diarrhea, or constipation. Hands: He plays video games well.  Legs: Muscle mass and strength seem normal. There are no complaints of numbness, tingling, burning, or pain. No edema is noted. Feet: There are no obvious foot problems. There are no complaints of numbness, tingling, burning, or pain. No edema is noted. Hypoglycemia: He has not had any low  BGs recently.   Emotional/psychological: The patient feels"pretty okay".  Diabetes identification: He has his pump and a wallet card.  .  5. BG printout: We have data for the past 2 weeks. He is changing his sites every 4-6 days. He is checking BGs 0-4 times per day, for an average of 1.4 times per day, compared with 2.5 times per day at his last visit. He checked BGs 20 times in 14 days. He bolused 3-4 times per day, with fewer of the boluses being both correction doses and food boluses. Average BG is 240, compared with 274 at his last visit and with 232 at his prior visit. BG range is 108->400, compared with 72->400 at his last visit and with 56->400 at his prior visit. The more often he changes his sites, checks his BGs, and boluses, the better his BGs are.    PAST MEDICAL, FAMILY, AND SOCIAL HISTORY: Past Medical History:  Diagnosis Date  . DM type 1 with diabetic peripheral neuropathy ([facility])   . Goiter   . Hypertension   . Hypoglycemia associated with diabetes ([facility])   . Hypogonadotropic hypogonadism ([facility])   . Hypothyroidism, acquired, autoimmune   . Tachycardia   . Thyroiditis, autoimmune   . Type 1 diabetes mellitus not at goal [facility])   . Type 1 diabetes mellitus with diabetic autonomic neuropathy ([facility])     Family History  Problem Relation Age of Onset  . Diabetes Maternal Uncle   . Hypertension Maternal Grandmother   . Thyroid disease Neg Hx   . Obesity Neg Hx   . Kidney disease Neg Hx   . Cancer Neg Hx   . Anemia Neg Hx      Current Outpatient Medications:  .  escitalopram (LEXAPRO) 10 MG tablet, TAKE 1 TABLET BY MOUTH ONCE DAILY, Disp: 30 tablet, Rfl: 4 .  insulin aspart (NOVOLOG) 100 UNIT/ML injection, INJECT 300 UNITS INTO PUMP EVERY 48-72 HOURS AND PER PROTOCOL FOR HYPERGLYCEMIA, Disp: 40 mL, Rfl: 5 .  levothyroxine (SYNTHROID) 25 MCG tablet, TAKE 1 TABLET BY MOUTH DAILY BEFORE BREAKFAST, Disp: 30 tablet, Rfl: 5 .  lisinopril (ZESTRIL) 2.5 MG tablet, TAKE 1 TABLET BY  MOUTH ONCE DAILY, Disp: 90 tablet, Rfl: 1 .  pravastatin (PRAVACHOL) 10 MG tablet, TAKE 1 TABLET BY MOUTH EVERY EVENING, Disp: 90 tablet, Rfl: 1 .  acetone, urine, test strip, Check ketones per protocol (Patient not taking: No sig reported), Disp: 50 each, Rfl: 3 .  glucagon 1 MG injection, Inject 1 mg intramuscularly in thigh 1 time for severe hypoglycemia if unresponsive, unconscious, unable to swallow or has a seizure. (Patient not taking: Reported on 05/10/2021), Disp: 2 kit, Rfl: 3 .  glucose blood (BAYER CONTOUR NEXT TEST) test strip, Check sugar 10 x daily (Patient not taking: No sig reported), Disp: 300 each, Rfl: 3 .  insulin lispro (HUMALOG) 100 UNIT/ML injection, Up to 300 units of insulin in insulin pump every 48-72 hours pre DKA  and Hyperglycemia protocols (Patient not taking: No sig reported), Disp: 40 mL, Rfl: 5 .  ondansetron (ZOFRAN ODT) 4 MG disintegrating tablet, Take 1 tablet (4 mg total) by mouth every 8 (eight) hours as needed for nausea or vomiting. (Patient not taking: No sig reported), Disp: 20 tablet, Rfl: 0 .  promethazine (PHENERGAN) 25 MG tablet, Take 1 tablet (25 mg total) by mouth every 6 (six) hours as needed for nausea or vomiting. (Patient not taking: No sig reported), Disp: 20 tablet, Rfl: 0  Allergies as of 05/10/2021  . (No Known Allergies)    1. Work and Family: Scott Moran is still spending a lot of time on the web.  2. Activities: Few physical activities. He is a history and politics buff. He frequently participates in online forum discussions about historical and political topics. He is still thinking about becoming a history Pharmacist, hospital or a Building control surveyor.  3. Smoking, alcohol, or drugs: None 4. Primary Care Provider: He does not have a current primary care provider.  5. Psychiatry: He no longer has a psychiatrist.  6. Psychologist: He has not seen Dr. Celso Moran since 2018.   REVIEW OF SYSTEMS: There are no other significant problems involving Scott Moran's other body  systems.   Objective:  Vital Signs:  BP 108/64 (BP Location: Right Arm, Patient Position: Sitting, Cuff Size: Normal)   Pulse 70   Wt 161 lb 6.4 oz (73.2 kg)   BMI 24.89 kg/m     Ht Readings from Last 3 Encounters:  09/19/20 5' 7.52" (1.715 m)  08/13/19 5' 7.13" (1.705 m)  01/27/19 5' 7.17" (1.706 m)   Wt Readings from Last 3 Encounters:  05/10/21 161 lb 6.4 oz (73.2 kg)  01/30/21 158 lb (71.7 kg)  09/19/20 154 lb 9.6 oz (70.1 kg)   PHYSICAL EXAM:  Constitutional: Scott Moran looked healthy and happy today. His weight has increased 3 pounds since last visit. He was alert and bright. His affect was very upbeat and positive. His insight was good today.  Face: The face appears normal.  Eyes: There is no obvious arcus or proptosis. Moisture appears normal. Mouth: The oropharynx and tongue appear normal. Oral moisture is normal. Neck: The neck looks normal. No carotid bruits are noted. The thyroid gland is normal in size at about 20 grams. The consistency of the thyroid gland is normal today. The thyroid gland is not tender to palpation.  Lungs: The lungs are clear to auscultation. Air movement is good. Heart: Heart rate remains borderline tachycardic. Heart rhythm is regular. Heart sounds S1 and S2 are normal. I did not appreciate any pathologic cardiac murmurs. Abdomen: The abdomen is more enlarged. Bowel sounds are normal. There is no obvious hepatomegaly, splenomegaly, or other mass effect.  Arms: Muscle size and bulk are normal for age. Hands: There is no obvious tremor. Phalangeal and metacarpophalangeal joints are normal. Palmar muscles are normal. Palmar skin is normal. Palmar moisture is also normal. Legs: Muscles appear normal for age. No edema is present. Feet: Feet are normally formed. Dorsalis pedal pulses are 1+ on the left and 1+ on the right.   Neurologic: Strength is normal for age in both the upper and lower extremities. Muscle tone is normal. Sensation to touch is normal  in both legs, but decreased in both heels.      LAB DATA:   Results for orders placed or performed in visit on 05/10/21 (from the past 504 hour(s))  POCT Glucose (Device for Home Use)   Collection Time: 05/10/21  9:27 AM  Result Value Ref Range   Glucose Fasting, POC 192 (A) 70 - 99 mg/dL   POC Glucose    POCT glycosylated hemoglobin (Hb A1C)   Collection Time: 05/10/21  9:34 AM  Result Value Ref Range   Hemoglobin A1C 8.9 (A) 4.0 - 5.6 %   HbA1c POC (<> result, manual entry)     HbA1c, POC (prediabetic range)     HbA1c, POC (controlled diabetic range)    Results for orders placed or performed in visit on 01/30/21 (from the past 504 hour(s))  Comprehensive metabolic panel   Collection Time: 05/05/21 11:14 AM  Result Value Ref Range   Glucose, Bld 165 (H) 65 - 139 mg/dL   BUN 9 7 - 25 mg/dL   Creat 0.84 0.60 - 1.35 mg/dL   BUN/Creatinine Ratio NOT APPLICABLE 6 - 22 (calc)   Sodium 138 135 - 146 mmol/L   Potassium 3.9 3.5 - 5.3 mmol/L   Chloride 99 98 - 110 mmol/L   CO2 29 20 - 32 mmol/L   Calcium 9.8 8.6 - 10.3 mg/dL   Total Protein 8.0 6.1 - 8.1 g/dL   Albumin 4.8 3.6 - 5.1 g/dL   Globulin 3.2 1.9 - 3.7 g/dL (calc)   AG Ratio 1.5 1.0 - 2.5 (calc)   Total Bilirubin 0.8 0.2 - 1.2 mg/dL   Alkaline phosphatase (APISO) 81 36 - 130 U/L   AST 14 10 - 40 U/L   ALT 14 9 - 46 U/L  T3, free   Collection Time: 05/05/21 11:14 AM  Result Value Ref Range   T3, Free 3.5 2.3 - 4.2 pg/mL  T4, free   Collection Time: 05/05/21 11:14 AM  Result Value Ref Range   Free T4 1.4 0.8 - 1.8 ng/dL  TSH   Collection Time: 05/05/21 11:14 AM  Result Value Ref Range   TSH 2.80 0.40 - 4.50 mIU/L  Lipid panel   Collection Time: 05/05/21 11:14 AM  Result Value Ref Range   Cholesterol 208 (H) <200 mg/dL   HDL 51 > OR = 40 mg/dL   Triglycerides 83 <150 mg/dL   LDL Cholesterol (Calc) 139 (H) mg/dL (calc)   Total CHOL/HDL Ratio 4.1 <5.0 (calc)   Non-HDL Cholesterol (Calc) 157 (H) <130 mg/dL (calc)     Labs 05/10/21: HbA1c 8.9%, CBG 192  Labs 05/05/21: TSH 2.8, free T4 1.4, free T3 3.5; CMP normal; cholesterol 208, triglycerides 83, HDL 51, LDL 139  Labs 01/30/21: HbA1c 8.3%, CBG 198  Labs 09/19/20: HbA1c 8.3%, CBG 146  Labs 06/15/20: HbA1c 8.3%, CBG 209; TSH 2.42, free T4 1.2, free T3 2.8; CMP normal, except glucose 224, with sodium artifactually low at 134 and chloride artifactually low at 97; cholesterol 189, triglycerides 90, HDL 52, LDL 110; LH 2.4, FSH 6.7, testosterone 612; urinary microalbumin/creatinine ratio 11  Labs 03/16/20: HbA1c 8.9%, CBG 142  Labs 11/12/19: HbA1c 7.5%, CBG 222; HbA1c is lower, but at the cost of too many low BGs.  Labs 08/13/19: HbA1c 8.7%, CBG 264  Labs 07/15/19: TSH 1.90, free T4 1.2, free T3 2.8; CMP normal, except glucose 180; CBC normal; cholesterol 177, triglycerides 65, HDL 56, LDL 106; microalbumin/creatinine ratio 3; LH 3.5, FSH 7.7, testosterone 495 (ref 250-827), free testosterone 68.1 (ref 46-224)  Labs 04/28/19: HbA1c 7.7%, CBG 394; U/A positive glucose and moderate ketones  Labs 01/27/19: HbA1c 7.4%, CBG 231  Labs 10/27/18: HbA1c 8.0%, CBG 250  Labs 07/21/18: HbA1c 8.2%, CBG 166  Labs 07/15/18: TSH  1.54, free T4 1.2, free T3 3.5; CMP normal; cholesterol 176, triglycerides 74, HDL 53, LDL 107; urinary microalbumin/creatinine ratio 6  Labs 04/21/18: HbA1c 8.2%, CBG 295  Labs 01/20/18: HbA1c 8.4%, CBG 206  Labs 10/30/17: HbA1c 9.0%, CBG 147  Labs 08/13/17: CBG 354  Labs 06/04/17: CBG 173; BMP normal  Labs 05/29/17: HbA1c 8.2%; TSH 1.84, free T4 1.2, free T3 3.4, CMP normal except for glucose 310 and potassium 5.8; LH 3.7, FSH 5.9, testosterone 634, free testosterone 76 (ref 46-224), estradiol 31; microalbumin/creatinine ratio 4; cholesterol 187, triglycerides 63, HDL 59, LDL 116  Labs 04/03/17: CBG 182  Labs 01/31/17: HbA1c 7.6%, CBG 341  Labs 10/30/16: HbA1c 7.5%  Labs 07/25/16: HbA1c 7.8%  Labs 04/25/16: HbA1c 7.7%  Labs 04/09/16: LH  3.4, FSH 6.0, testosterone 504, free testosterone 85.5 (ref 47-244); cholesterol 176, triglycerides 92, HDL 80, LDL 88; CMP norma except for glucose 224; TSH 1.75, free T4 1.2, free T3 3.2; microalbumin/creatinine ratio 4  Labs 01/24/16: HbA1c 8.6%  Labs 10/20/15: HbA1c 8.1%  Labs 09/15/15: BMP normal except for glucose of 143 and calcium 8.3.  Labs 07/20/15: HbA1c 8.2%  Labs 03/25/15: HbA1c 7.8%  Labs 12/02/14: HbA1c 8.5%;  LH 4.0, FSH 8.2, testosterone 621.97; CMP normal; TSH 2.01, free T4 1.21, free T3 3.6; cholesterol 155, triglycerides 46, HDL  55, LDL 91; microalbumin/creatinine ratio 6.6  Labs 03/30/14: TSH 1.935, free T4 1.12, free T3 3.2; CMP: normal except for glucose 203; testosterone 624, FSH 6.3, LH 2.4; urine microalbumin/creatinine ratio 6.4; cholesterol 163, triglycerides 55, HDL 64, LDL 80  Labs 08/23/13: TSH 1.139; CMP normal except for a glucose of 145; CBC: WBC 5.4, Hgb 12.7, Hct 34.7%   Assessment and Plan:   ASSESSMENT:  1-2. Type 1 diabetes mellitus/hypoglycemia:  A. [place]'s HbA1c has increased from 8.3% to 8.9%. He is not taking care of his DM as well as he had in the past. On some days he is better at checking BGs and giving himself insulin boluses, but on other days he does not do well.  B. He is not having any documented low BGs.    C. He continues to be inconsistently inconsistent in taking care of his T1DM.  3. Autonomic neuropathy and inappropriate sinus tachycardia: These problems are better.  4. Peripheral neuropathy: This problem is evident again today, but is mild.    5. Hypothyroid: He was euthyroid in December 2015, in May 2017, in June 2018, in August 2019, in August 2020, in July 2021, and in May 2022 on his current dose of Synthroid.  6. Hypogonadotropic hypogonadism: His testosterone level in May 2017 was acceptable, but was lower than in December 2015. His testosterone level in June 2018 was good, but his free testosterone was relatively low for his  age.  His testosterone in August 2020 was within normal limits, but still relatively low for his age. His testosterone level in July 2021 was higher.   7. Hypertension: His BP is good today. He needs to take his lisinopril and exercise regularly.  8. Goiter/Hashimoto's disease: His goiter is now normal in size. The pattern of waxing and waning of thyroid gland size is c/w evolving thyroiditis. The thyroiditis is clinically quiescent 9. Depression: This problem is much better today.  He says that he is taking the escitalopram reliably. Going back to school has also been a major positive factor for him. He does not exhibit any suicidal thoughts or emotions. I would like to see him stay in college. I  would like him to continue to see Dr. [name] and to re-establish care with a psychiatrist. Since he does not have a psychiatrist or a PCP to prescribe escitalopram, I have agreed to prescribe it for him until he can find a new psychiatrist or PCP. Unfortunately, he is not interested in finding another PCP or psychiatrist, so I have continued to prescribe the escitalopram for him.   10. Hypercholesterolemia: His cholesterol levels were elevated in August 2019, in August 2020, and again in 2021. I started him on pravastatin then. Exercise and more consistent BG control would help. His total cholesterol and LDL were higher in May 2022. Since he says he has been taking the pravastatin pretty reliably, we need to increase his pravastatin dose.   11. Noncompliance: He is doing worse at taking care of his DM. He needs to be more consistent with BG testing and bolusing. I continue to hope that as Scott Moran ages and matures, he will do a better job of taking care of his T1DM. At this time, however, he remains consistently inconsistent.      PLAN:  1. Diagnostic: I reviewed his BG printout. I ordered fasting lipids to be done in 3 months. .  2. Therapeutic: Continue Synthroid, 25 mcg/day, escitalopram, 10 mg/day, lisinopril  2.5 mg/day. Increase pravastatin to 20 mg/day. Give both correction boluses and food boluses. We changes his current pump settings:  Continue Basal rates:   MN: 0.75 5 AM: 0.75 8 AM:  0.70 Noon: 0.950 6 PM: 1.65 9 PM: 1.35  Continue BG targets:  MN: 150 8 AM: 110 10 PM: 150  Continue ISF: 65  Continue ICRs: MN: 15 8 AM: 11 4:30 PM: 15 3. Patient education: Scott Moran is a very intelligent young man who knows intellectually what he should do and how to do it. Unfortunately, he still lacks the commitment to be as consistent with his T1DM care as he needs to be. If he will start using the Guardian 3 sensor and take advantage of all the benefits of the Medtronic 670G pump and sensor combination, then his BG control can be much better. 4. Follow-up: 3 months with me  Level of Service: This visit lasted in excess of 60  minutes. More than 50% of the visit was devoted to counseling.  Scott Sers, MD, CDE Adult and Pediatric Endocrinology 05/10/2021 9:58 AM

## 2021-05-10 NOTE — Patient Instructions (Signed)
Follow up visit in 3 months. Please repeat fasting lipid panel 1-2 weeks prior to next visit.

## 2021-06-13 ENCOUNTER — Other Ambulatory Visit (INDEPENDENT_AMBULATORY_CARE_PROVIDER_SITE_OTHER): Payer: Self-pay | Admitting: "Endocrinology

## 2021-06-13 DIAGNOSIS — F3289 Other specified depressive episodes: Secondary | ICD-10-CM

## 2021-08-04 ENCOUNTER — Other Ambulatory Visit (INDEPENDENT_AMBULATORY_CARE_PROVIDER_SITE_OTHER): Payer: Self-pay | Admitting: "Endocrinology

## 2021-08-09 NOTE — Progress Notes (Signed)
Subjective:  Patient Name: Scott Moran Date of Birth: 01/29/1991  MRN: [id number]  Scott Moran  presents to the office today for follow-up of his type 1 diabetes mellitus, goiter, hypoglycemia, depression, social anxiety disorder, avoidant personality disorder, hypothyroidism, thyroiditis, diabetic autonomic neuropathy, inappropriate sinus tachycardia, non-proliferative diabetic retinopathy, diabetic peripheral neuropathy, fatigue, hypogonadotropic hypogonadism, and medical non-compliance.  HISTORY OF PRESENT ILLNESS:   [name] is a 30 y.o. Caucasian young man. Colonel was unaccompanied.   1. I first saw the patient in consultation on 03/27/05 in the [facility] in [place], [place]. He had been referred by his primary care provider, [name], in [place], [place], for evaluation and treatment of poorly controlled type 1 diabetes. The patient was 30 years old.  A. The patient had been diagnosed with type 1 diabetes in February of 1995 at age 30. He was on an insulin pump when I first met him. His hemoglobin A1c had increased from 7.1% in December of 2005 to 8.2% in April 2006. He was having frequent snacks that he was not taking boluses for. He was also frequently noncompliant with checking blood sugars and taking meal boluses. I adjusted his basal rates.  B. At his next [facility] visit on 06/19/05, he was still very noncompliant. His mental status was normal. He had a 25 gram goiter. His father was serving in [place] with the [employer]. There was a lot of stress at home. I again adjusted his basal rates.  C. At the end of July 2006 we closed our satellite office in [place]. The patient elected to be followed at our clinic here in [place]. I have followed him here ever since.  2. During the past sixteen years, the patient has had several clinical issues.   A. T1DM:    1). The patient's blood glucose control has usually been fairly good  for a teenager/young adult. His maximum hemoglobin A1c occurred on 04/16/06 when the hemoglobin A1c was 12.1%. Since then the hemoglobin A1c values have varied between 6.9% and 8.6%, until his HbA1c on 10/30/17 of 9.0%. Since then his HbA1c has varied from 7.4% to 8.4%.    2). He is still often noncompliant with checking blood sugars, bolusing, and changing his insulin pump sites on time, but he is more compliant and more careful than he used to be. He has never required hospital re-admission for DKA, severe hyperglycemia, or hypoglycemia.   3). He started his new Medtronic 670G insulin pump and Guardian 3 sensor in September 2017, but later stopped using the sensor and has refused to resume using it.    B. Goiter, Hashimoto's thyroiditis, and hypothyroidism: In late 2008 and early 2009 the patient developed hypothyroidism secondary to Hashimoto's disease. I started him on Synthroid, 25 mcg per day. When he takes his medication, he is euthyroid.  C. Autonomic neuropathy and inappropriate sinus tachycardia: In late 2008 he developed autonomic neuropathy which manifested as inappropriate sinus tachycardia. These problems have varied in parallel with his BGs and HbA1c values.  D. Diabetic peripheral neuropathy: The patient has had symptoms and signs of mild diabetic peripheral neuropathy at several times in the past. His peripheral neuropathic signs and symptoms have similarly varied in parallel with his BGs and HbA1c values.   E. Non-proliferative diabetic retinopathy: This problem was identified during a diabetic eye exam in June 2010.  F. Hypogonadotropic hypogonadism: The patient has always been somewhat lackadaisical and relatively passive. On 06/29/10 his [place] was 4.9, LH  was 2.4, testosterone was 399.66, and free testosterone was 79.8. The testosterone and free testosterone were relatively low for an 30 year old young man. At the same time the [facility] and LH while "normal", appeared to be actually  inappropriately low for the level of testosterone he had. We discussed the options for treatment including exercise and medication, to include AndroGel. The patient chose the exercise option. By 10/19/10 the [facility] was 6.8, the LH was 3.2, total testosterone was 615.88 and the free testosterone was 131.6. In retrospect, when he was more depressed he may have had less gonadotropic function.   G. Depression/Anxiety/Personality disorders:    1). The patient has been diagnosed with depression at different times in the past. For some time he took Remeron (mirtazapine), but discontinued that medication in early 2012. I had been trying to convince him for some time that he was depressed and that he should seek out help. He eventually did decide to seek psychiatric help.    2). On 06/08/13 Rayshard was evaluated by [name], a noted psychiatrist in [place].  [name] and his staff performed neuropsychiatric testing. On the MIKEY MAFFETT had problems with non-verbal reasoning and emotional intelligence. 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 [name] prescribed escitalopram, 10 mg/day. Kamir was supposed to continue to see [name] in follow up. Unfortunately, Nikolaos did not follow up.    3). Cregg was then referred to a therapist, [name]. Kees enjoyed working with Dr. [name] in the past and felt that he had made progress over time. Unfortunately, Ahmar has not been back to see Dr. [name] since the summer of 2018. I have been prescribing Dontrez's escitalopram ever since.    H. 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   I. He had his second [place] covid vaccination in May 2021.  3. The patient's last [facility] visit was on 05/10/21. At that visit I continued his insulin pump settings. I also  continued his Synthroid, escitalopram, pravastatin, and lisinopril doses.  A. In the interim he has been healthy, but has had some URI/allergy symptoms at times. He is not getting much exercise.  B. He graduated from [employer] with an associate's degree. He does not have any immediate plans to continue in college or to seek employment.  C. He is still not using his Guardian 3 sensor. He has been using his Medtronic 770 pump.    Juanda Bond has not seen Dr. [name], his therapist, due to conflicts with his academic schedule and lack of interest.   E. Herchel takes his Synthroid dose of 25 mcg/day every night. He also takes escitalopram 10 mg, lisinopril 2.5 mg daily, and pravastatin 10 mg at night as well.    F. He has his learner's permit, but has not wanted to try driving. He is severely afraid of other drivers that are "terrible".   4. Pertinent Review of Systems:  Constitutional: Destry feels "pretty good overall".  Eyes: Vision is good with his glasses. There are no significant eye complaints. Last eye exam was in July 2021. Everything looked fine.  Mouth: No problems. He had a dental exam in February 2022. "Everything looked fine."  Neck: Aleric has no complaints of anterior neck swelling, soreness, tenderness,  pressure, discomfort, or difficulty swallowing.  Heart: Heart rate is faster today. The patient has no  complaints of palpitations, irregular heat beats, chest pain, or chest pressure. Gastrointestinal: No postprandial bloating. Bowel movents seem normal. The patient has no other complaints of excessive hunger, acid reflux, upset stomach, stomach aches or pains, diarrhea, or constipation. Hands: He plays video games well.  Legs: Muscle mass and strength seem normal. There are no complaints of numbness, tingling, burning, or pain. No edema is noted. Feet: There are no obvious foot problems. There are no complaints of numbness, tingling, burning, or pain. No edema is noted. Hypoglycemia: He has  not had many low BGs recently.   Emotional/psychological: The patient feels "pretty good".  Diabetes identification: He has his pump and a wallet card.  .  5. BG printout:  A. We have data for the past 2 weeks. He is changing his sites every 2-7 days. He is checking BGs 1-4 times per day, for an average of 2 times per day, compared with 1.8 times per day at his last visit. He checked BGs 28 times in 14 days, compared with 20 times at his last visit. He bolused 1-5 times per day, with some of the boluses being both correction doses and food boluses.  B. Average BG is 192, compared with 240 at his last visit and with 274 at his prior visit. BG range is 63-320, compared with 108->400 at his last visit and with 72->400 at his prior visit.    PAST MEDICAL, FAMILY, AND SOCIAL HISTORY: Past Medical History:  Diagnosis Date   DM type 1 with diabetic peripheral neuropathy ([place])    Goiter    Hypertension    Hypoglycemia associated with diabetes ([place])    Hypogonadotropic hypogonadism ([facility])    Hypothyroidism, acquired, autoimmune    Tachycardia    Thyroiditis, autoimmune    Type 1 diabetes mellitus not at goal [facility])    Type 1 diabetes mellitus with diabetic autonomic neuropathy ([facility])     Family History  Problem Relation Age of Onset   Diabetes Maternal Uncle    Hypertension Maternal Grandmother    Thyroid disease Neg Hx    Obesity Neg Hx    Kidney disease Neg Hx    Cancer Neg Hx    Anemia Neg Hx      Current Outpatient Medications:    escitalopram (LEXAPRO) 10 MG tablet, TAKE 1 TABLET BY MOUTH ONCE DAILY, Disp: 30 tablet, Rfl: 4   insulin aspart (NOVOLOG) 100 UNIT/ML injection, INJECT 300 UNITS INTO PUMP EVERY 48-72 HOURS AND PER PROTOCOL FOR HYPERGLYCEMIA, Disp: 40 mL, Rfl: 5   levothyroxine (SYNTHROID) 25 MCG tablet, TAKE 1 TABLET BY MOUTH DAILY BEFORE BREAKFAST, Disp: 30 tablet, Rfl: 5   lisinopril (ZESTRIL) 2.5 MG tablet, TAKE 1 TABLET BY MOUTH ONCE DAILY, Disp: 90 tablet, Rfl: 1    pravastatin (PRAVACHOL) 20 MG tablet, Take one tablet each evening., Disp: 30 tablet, Rfl: 11   acetone, urine, test strip, Check ketones per protocol (Patient not taking: No sig reported), Disp: 50 each, Rfl: 3   glucagon 1 MG injection, Inject 1 mg intramuscularly in thigh 1 time for severe hypoglycemia if unresponsive, unconscious, unable to swallow or has a seizure. (Patient not taking: No sig reported), Disp: 2 kit, Rfl: 3   glucose blood (BAYER CONTOUR NEXT TEST) test strip, Check sugar 10 x daily (Patient not taking: No sig reported), Disp: 300 each, Rfl: 3   insulin lispro (HUMALOG) 100 UNIT/ML injection, Up to 300 units of insulin in insulin pump every 48-72 hours pre DKA and Hyperglycemia protocols (  Patient not taking: No sig reported), Disp: 40 mL, Rfl: 5   ondansetron (ZOFRAN ODT) 4 MG disintegrating tablet, Take 1 tablet (4 mg total) by mouth every 8 (eight) hours as needed for nausea or vomiting. (Patient not taking: No sig reported), Disp: 20 tablet, Rfl: 0   pravastatin (PRAVACHOL) 10 MG tablet, Take 1 tablet (10 mg total) by mouth every evening. Take 20 mg by mouth daily (Patient not taking: Reported on 08/10/2021), Disp: 180 tablet, Rfl: 3   promethazine (PHENERGAN) 25 MG tablet, Take 1 tablet (25 mg total) by mouth every 6 (six) hours as needed for nausea or vomiting. (Patient not taking: No sig reported), Disp: 20 tablet, Rfl: 0  Allergies as of 08/10/2021   (No Known Allergies)    1. Work and Family: Jadie is still spending a lot of time on the web.  2. Activities: Few physical activities. He is a history and politics buff. He frequently participates in online forum discussions about historical and political topics. He is still thinking about becoming a history Pharmacist, hospital or a Building control surveyor. His father is retiring soon. 3. Smoking, alcohol, or drugs: None 4. Primary Care Provider: He does not have a current primary care provider.  5. Psychiatry: He no longer has a psychiatrist.  6.  Psychologist: He has not seen [name] since 2018.   REVIEW OF SYSTEMS: There are no other significant problems involving Aaban's other body systems.   Objective:  Vital Signs:  BP 118/74 (BP Location: Right Arm, Patient Position: Sitting, Cuff Size: Normal)   Pulse 76   Wt 157 lb 6.4 oz (71.4 kg)   BMI 24.27 kg/m     Ht Readings from Last 3 Encounters:  09/19/20 5' 7.52" (1.715 m)  08/13/19 5' 7.13" (1.705 m)  01/27/19 5' 7.17" (1.706 m)   Wt Readings from Last 3 Encounters:  08/10/21 157 lb 6.4 oz (71.4 kg)  05/10/21 161 lb 6.4 oz (73.2 kg)  01/30/21 158 lb (71.7 kg)   PHYSICAL EXAM:  Constitutional: Aldyn looked healthy and happy today. His weight has decreased 4 pounds since last visit. He says he is not eating differently or exercising. He was alert and bright. His affect was very upbeat and positive. His insight was good today. He quickly changes the subject when I try to discuss the option of him going back to school.  Face: The face appears normal.  Eyes: There is no obvious arcus or proptosis. Moisture appears normal. Mouth: The oropharynx and tongue appear normal. Oral moisture is normal. Neck: The neck looks normal. No carotid bruits are noted. The thyroid gland is top-normal in size at about 20 grams. The consistency of the thyroid gland is normal today. The thyroid gland is not tender to palpation.  Lungs: The lungs are clear to auscultation. Air movement is good. Heart: Heart rate remains borderline tachycardic. Heart rhythm is regular. Heart sounds S1 and S2 are normal. I did not appreciate any pathologic cardiac murmurs. Abdomen: The abdomen is more enlarged. Bowel sounds are normal. There is no obvious hepatomegaly, splenomegaly, or other mass effect.  Arms: Muscle size and bulk are normal for age. Hands: There is no obvious tremor. Phalangeal and metacarpophalangeal joints are normal. Palmar muscles are normal. Palmar skin is normal. Palmar moisture is  also normal. Legs: Muscles appear normal for age. No edema is present. Feet: Feet are normally formed. Dorsalis pedal pulses are 1+ on the left and 1+ on the right.   Neurologic: Strength is normal  for age in both the upper and lower extremities. Muscle tone is normal. Sensation to touch is normal in both legs, but decreased in both heels.      LAB DATA:   Results for orders placed or performed in visit on 08/10/21 (from the past 504 hour(s))  POCT Glucose (Device for Home Use)   Collection Time: 08/10/21 10:16 AM  Result Value Ref Range   Glucose Fasting, POC 153 (A) 70 - 99 mg/dL   POC Glucose    POCT glycosylated hemoglobin (Hb A1C)   Collection Time: 08/10/21 10:24 AM  Result Value Ref Range   Hemoglobin A1C 8.4 (A) 4.0 - 5.6 %   HbA1c POC (<> result, manual entry)     HbA1c, POC (prediabetic range)     HbA1c, POC (controlled diabetic range)     Labs 08/10/21: HbA1c 8.4%, CBG 153   Labs 05/10/21: HbA1c 8.9%, CBG 192  Labs 05/05/21: TSH 2.8, free T4 1.4, free T3 3.5; CMP normal; cholesterol 208, triglycerides 83, HDL 51, LDL 139  Labs 01/30/21: HbA1c 8.3%, CBG 198  Labs 09/19/20: HbA1c 8.3%, CBG 146  Labs 06/15/20: HbA1c 8.3%, CBG 209; TSH 2.42, free T4 1.2, free T3 2.8; CMP normal, except glucose 224, with sodium artifactually low at 134 and chloride artifactually low at 97; cholesterol 189, triglycerides 90, HDL 52, LDL 110; LH 2.4, FSH 6.7, testosterone 612; urinary microalbumin/creatinine ratio 11  Labs 03/16/20: HbA1c 8.9%, CBG 142  Labs 11/12/19: HbA1c 7.5%, CBG 222; HbA1c is lower, but at the cost of too many low BGs.  Labs 08/13/19: HbA1c 8.7%, CBG 264  Labs 07/15/19: TSH 1.90, free T4 1.2, free T3 2.8; CMP normal, except glucose 180; CBC normal; cholesterol 177, triglycerides 65, HDL 56, LDL 106; microalbumin/creatinine ratio 3; LH 3.5, FSH 7.7, testosterone 495 (ref 250-827), free testosterone 68.1 (ref 46-224)  Labs 04/28/19: HbA1c 7.7%, CBG 394; U/A positive glucose and  moderate ketones  Labs 01/27/19: HbA1c 7.4%, CBG 231  Labs 10/27/18: HbA1c 8.0%, CBG 250  Labs 07/21/18: HbA1c 8.2%, CBG 166  Labs 07/15/18: TSH 1.54, free T4 1.2, free T3 3.5; CMP normal; cholesterol 176, triglycerides 74, HDL 53, LDL 107; urinary microalbumin/creatinine ratio 6  Labs 04/21/18: HbA1c 8.2%, CBG 295  Labs 01/20/18: HbA1c 8.4%, CBG 206  Labs 10/30/17: HbA1c 9.0%, CBG 147  Labs 08/13/17: CBG 354  Labs 06/04/17: CBG 173; BMP normal  Labs 05/29/17: HbA1c 8.2%; TSH 1.84, free T4 1.2, free T3 3.4, CMP normal except for glucose 310 and potassium 5.8; LH 3.7, FSH 5.9, testosterone 634, free testosterone 76 (ref 46-224), estradiol 31; microalbumin/creatinine ratio 4; cholesterol 187, triglycerides 63, HDL 59, LDL 116  Labs 04/03/17: CBG 182  Labs 01/31/17: HbA1c 7.6%, CBG 341  Labs 10/30/16: HbA1c 7.5%  Labs 07/25/16: HbA1c 7.8%  Labs 04/25/16: HbA1c 7.7%  Labs 04/09/16: LH 3.4, FSH 6.0, testosterone 504, free testosterone 85.5 (ref 47-244); cholesterol 176, triglycerides 92, HDL 80, LDL 88; CMP norma except for glucose 224; TSH 1.75, free T4 1.2, free T3 3.2; microalbumin/creatinine ratio 4  Labs 01/24/16: HbA1c 8.6%  Labs 10/20/15: HbA1c 8.1%  Labs 09/15/15: BMP normal except for glucose of 143 and calcium 8.3.  Labs 07/20/15: HbA1c 8.2%  Labs 03/25/15: HbA1c 7.8%  Labs 12/02/14: HbA1c 8.5%;  LH 4.0, FSH 8.2, testosterone 621.97; CMP normal; TSH 2.01, free T4 1.21, free T3 3.6; cholesterol 155, triglycerides 46, HDL  55, LDL 91; microalbumin/creatinine ratio 6.6  Labs 03/30/14: TSH 1.935, free T4 1.12, free T3 3.2;  CMP: normal except for glucose 203; testosterone 624, FSH 6.3, LH 2.4; urine microalbumin/creatinine ratio 6.4; cholesterol 163, triglycerides 55, HDL 64, LDL 80  Labs 08/23/13: TSH 1.139; CMP normal except for a glucose of 145; CBC: WBC 5.4, Hgb 12.7, Hct 34.7%   Assessment and Plan:   ASSESSMENT:  1-2. Type 1 diabetes mellitus/hypoglycemia:  A. Maahir's  HbA1c has decreased from 8.9% to 8.4%. He is taking better care of his DM now, but still not as well as he was doing in late 2021 and early 2022. On some days he does better at checking BGs and giving himself insulin boluses, but on other days he does not do well.  B. He had one documented low BG of 63, but can't remember why.    C. He continues to be inconsistently inconsistent in taking care of his T1DM.  3. Autonomic neuropathy and inappropriate sinus tachycardia: These problems are better.  4. Peripheral neuropathy: This problem is evident again today, but is mild.    5. Hypothyroid: He was euthyroid in December 2015, in May 2017, in June 2018, in August 2019, in August 2020, in July 2021, and in May 2022 on his current dose of Synthroid.  6. Hypogonadotropic hypogonadism: His testosterone level in May 2017 was acceptable, but was lower than in December 2015. His testosterone level in June 2018 was good, but his free testosterone was relatively low for his age.  His testosterone in August 2020 was within normal limits, but still relatively low for his age. His testosterone level in July 2021 was higher.   7. Hypertension: His BP is good today. He needs to take his lisinopril and exercise regularly.  8. Goiter/Hashimoto's disease: His goiter is now normal in size. The pattern of waxing and waning of thyroid gland size is c/w evolving thyroiditis. The thyroiditis is clinically quiescent 9. Depression: This problem is much better today.  He says that he is taking the escitalopram reliably. Going back to school has also been a major positive factor for him. He does not exhibit any suicidal thoughts or emotions. I would like to see him stay in college. I would like him to continue to see Dr. [name] and to re-establish care with a psychiatrist. Since he does not have a psychiatrist or a PCP to prescribe escitalopram, I have agreed to prescribe it for him until he can find a new psychiatrist or PCP.  Unfortunately, he is not interested in finding another PCP or psychiatrist, so I have continued to prescribe the escitalopram for him.   10. Hypercholesterolemia: His cholesterol levels were elevated in August 2019, in August 2020, and again in 2021. I started him on pravastatin then. Exercise and more consistent BG control would help. His total cholesterol and LDL were higher in May 2022. Since he says he has been taking the pravastatin pretty reliably, we need to increase his pravastatin dose.   11. Noncompliance: He is doing worse at taking care of his DM. He needs to be more consistent with BG testing and bolusing. I continue to hope that as Jehu ages and matures, he will do a better job of taking care of his T1DM. At this time, however, he remains consistently inconsistent.      PLAN:  1. Diagnostic: I reviewed his BG printout. I ordered fasting lipids to be done in 3 months.   2. Therapeutic: Continue Synthroid, 25 mcg/day, escitalopram, 10 mg/day, lisinopril 2.5 mg/day. Increase pravastatin to 20 mg/day. Give both correction boluses and food  boluses. We continued his current pump settings:  Continue Basal rates:   MN: 0.75 5 AM: 0.75 8 AM:  0.70 Noon: 0.950 6 PM: 1.65 9 PM: 1.35  Continue BG targets:  MN: 150 8 AM: 110 10 PM: 150  Continue ISF: 65  Continue ICRs: MN: 15 8 AM: 11 4:30 PM: 15 3. Patient education: Brace is a very intelligent young man who knows intellectually what he should do and how to do it. Unfortunately, he still lacks the commitment to be as consistent with his T1DM care as he needs to be. If he will start using the Guardian 3 sensor and take advantage of all the benefits of the Medtronic 670G pump and sensor combination, then his BG control can be much better. 4. Follow-up: 3 months with me  Level of Service: This visit lasted in excess of 55  minutes. More than 50% of the visit was devoted to counseling.  [name] Sers, MD, CDE Adult and Pediatric  Endocrinology 08/10/2021 10:36 AM

## 2021-08-10 ENCOUNTER — Ambulatory Visit (INDEPENDENT_AMBULATORY_CARE_PROVIDER_SITE_OTHER): Payer: BC Managed Care – PPO | Admitting: "Endocrinology

## 2021-08-10 ENCOUNTER — Other Ambulatory Visit: Payer: Self-pay

## 2021-08-10 ENCOUNTER — Encounter (INDEPENDENT_AMBULATORY_CARE_PROVIDER_SITE_OTHER): Payer: Self-pay | Admitting: "Endocrinology

## 2021-08-10 VITALS — BP 118/74 | HR 76 | Wt 157.4 lb

## 2021-08-10 DIAGNOSIS — E1065 Type 1 diabetes mellitus with hyperglycemia: Secondary | ICD-10-CM

## 2021-08-10 DIAGNOSIS — F320 Major depressive disorder, single episode, mild: Secondary | ICD-10-CM

## 2021-08-10 DIAGNOSIS — E7800 Pure hypercholesterolemia, unspecified: Secondary | ICD-10-CM

## 2021-08-10 DIAGNOSIS — E1042 Type 1 diabetes mellitus with diabetic polyneuropathy: Secondary | ICD-10-CM

## 2021-08-10 DIAGNOSIS — E049 Nontoxic goiter, unspecified: Secondary | ICD-10-CM | POA: Diagnosis not present

## 2021-08-10 DIAGNOSIS — E063 Autoimmune thyroiditis: Secondary | ICD-10-CM

## 2021-08-10 DIAGNOSIS — Z9119 Patient's noncompliance with other medical treatment and regimen: Secondary | ICD-10-CM

## 2021-08-10 DIAGNOSIS — E1043 Type 1 diabetes mellitus with diabetic autonomic (poly)neuropathy: Secondary | ICD-10-CM

## 2021-08-10 DIAGNOSIS — I10 Essential (primary) hypertension: Secondary | ICD-10-CM

## 2021-08-10 DIAGNOSIS — Z91199 Patient's noncompliance with other medical treatment and regimen due to unspecified reason: Secondary | ICD-10-CM

## 2021-08-10 DIAGNOSIS — E10649 Type 1 diabetes mellitus with hypoglycemia without coma: Secondary | ICD-10-CM

## 2021-08-10 LAB — LIPID PANEL
Cholesterol: 163 mg/dL (ref <200)
HDL: 58 mg/dL (ref >=40)
LDL Cholesterol (Calc): 90 mg/dL (calc)
Non-HDL Cholesterol (Calc): 105 mg/dL (calc) (ref <130)
Total CHOL/HDL Ratio: 2.8 (calc) (ref <5.0)
Triglycerides: 66 mg/dL (ref <150)

## 2021-08-10 LAB — POCT GLUCOSE (DEVICE FOR HOME USE): Glucose Fasting, POC: 153 mg/dL — AB (ref 70–99)

## 2021-08-10 LAB — POCT GLYCOSYLATED HEMOGLOBIN (HGB A1C): Hemoglobin A1C: 8.4 % — AB (ref 4.0–5.6)

## 2021-08-10 NOTE — Patient Instructions (Signed)
Follow up in 3 months

## 2021-09-21 DIAGNOSIS — U071 COVID-19: Secondary | ICD-10-CM | POA: Diagnosis not present

## 2021-10-30 ENCOUNTER — Other Ambulatory Visit (INDEPENDENT_AMBULATORY_CARE_PROVIDER_SITE_OTHER): Payer: Self-pay | Admitting: "Endocrinology

## 2021-10-30 DIAGNOSIS — E038 Other specified hypothyroidism: Secondary | ICD-10-CM

## 2021-11-09 ENCOUNTER — Ambulatory Visit (INDEPENDENT_AMBULATORY_CARE_PROVIDER_SITE_OTHER): Payer: BC Managed Care – PPO | Admitting: "Endocrinology

## 2021-12-04 ENCOUNTER — Other Ambulatory Visit (INDEPENDENT_AMBULATORY_CARE_PROVIDER_SITE_OTHER): Payer: Self-pay | Admitting: "Endocrinology

## 2021-12-04 DIAGNOSIS — F3289 Other specified depressive episodes: Secondary | ICD-10-CM

## 2021-12-05 ENCOUNTER — Ambulatory Visit (INDEPENDENT_AMBULATORY_CARE_PROVIDER_SITE_OTHER): Payer: Self-pay | Admitting: "Endocrinology

## 2021-12-05 ENCOUNTER — Other Ambulatory Visit: Payer: Self-pay

## 2021-12-05 ENCOUNTER — Encounter (INDEPENDENT_AMBULATORY_CARE_PROVIDER_SITE_OTHER): Payer: Self-pay | Admitting: "Endocrinology

## 2021-12-05 VITALS — BP 120/76 | HR 68 | Wt 159.0 lb

## 2021-12-05 DIAGNOSIS — I10 Essential (primary) hypertension: Secondary | ICD-10-CM

## 2021-12-05 DIAGNOSIS — E063 Autoimmune thyroiditis: Secondary | ICD-10-CM

## 2021-12-05 DIAGNOSIS — Z23 Encounter for immunization: Secondary | ICD-10-CM

## 2021-12-05 DIAGNOSIS — E10649 Type 1 diabetes mellitus with hypoglycemia without coma: Secondary | ICD-10-CM

## 2021-12-05 DIAGNOSIS — E1042 Type 1 diabetes mellitus with diabetic polyneuropathy: Secondary | ICD-10-CM

## 2021-12-05 DIAGNOSIS — E7800 Pure hypercholesterolemia, unspecified: Secondary | ICD-10-CM

## 2021-12-05 DIAGNOSIS — E1043 Type 1 diabetes mellitus with diabetic autonomic (poly)neuropathy: Secondary | ICD-10-CM

## 2021-12-05 DIAGNOSIS — E1065 Type 1 diabetes mellitus with hyperglycemia: Secondary | ICD-10-CM

## 2021-12-05 DIAGNOSIS — R000 Tachycardia, unspecified: Secondary | ICD-10-CM

## 2021-12-05 DIAGNOSIS — E049 Nontoxic goiter, unspecified: Secondary | ICD-10-CM

## 2021-12-05 DIAGNOSIS — I4711 Inappropriate sinus tachycardia, so stated: Secondary | ICD-10-CM

## 2021-12-05 LAB — POCT URINALYSIS DIPSTICK
Glucose, UA: POSITIVE — AB
Ketones, UA: NEGATIVE

## 2021-12-05 LAB — POCT GLYCOSYLATED HEMOGLOBIN (HGB A1C): Hemoglobin A1C: 7.9 % — AB (ref 4.0–5.6)

## 2021-12-05 LAB — POCT GLUCOSE (DEVICE FOR HOME USE): POC Glucose: 390 mg/dl — AB (ref 70–99)

## 2021-12-05 NOTE — Progress Notes (Signed)
Subjective:  Patient Name: Scott Moran Date of Birth: Jul 15, 1991  MRN: [id number]  Scott Moran  presents to the office today for follow-up of his type 1 diabetes mellitus, goiter, hypoglycemia, depression, social anxiety disorder, avoidant personality disorder, hypothyroidism, thyroiditis, diabetic autonomic neuropathy, inappropriate sinus tachycardia, non-proliferative diabetic retinopathy, diabetic peripheral neuropathy, fatigue, hypogonadotropic hypogonadism, and medical non-compliance.  HISTORY OF PRESENT ILLNESS:   Scott Moran is a 31 y.o. Caucasian young man. Scott Moran was unaccompanied.   1. I first saw the patient in consultation on 03/27/05 in the [facility] in [place], [place]. He had been referred by his primary care provider, [name], in [place], [place], for evaluation and treatment of poorly controlled type 1 diabetes. The patient was 31 years old.  A. The patient had been diagnosed with type 1 diabetes in February of 1995 at age 68. He was on an insulin pump when I first met him. His hemoglobin A1c had increased from 7.1% in December of 2005 to 8.2% in April 2006. He was having frequent snacks that he was not taking boluses for. He was also frequently noncompliant with checking blood sugars and taking meal boluses. I adjusted his basal rates.  B. At his next [place] visit on 06/19/05, he was still very noncompliant. His mental status was normal. He had a 25 gram goiter. His father was serving in [place] with the [employer]. There was a lot of stress at home. I again adjusted his basal rates.  C. At the end of July 2006 we closed our satellite office in [place]. The patient elected to be followed at our clinic here in [place]. I have followed him here ever since.  2. During the past sixteen years, the patient has had several clinical issues.   A. T1DM:    1). The patient's blood glucose control has usually been fairly good  for a teenager/young adult. His maximum hemoglobin A1c occurred on 04/16/06 when the hemoglobin A1c was 12.1%. Since then the hemoglobin A1c values have varied between 6.9% and 8.6%, until his HbA1c on 10/30/17 of 9.0%. Since then his HbA1c has varied from 7.4% to 8.4%.    2). He is still often noncompliant with checking blood sugars, bolusing, and changing his insulin pump sites on time, but he is more compliant and more careful than he used to be. He has never required hospital re-admission for DKA, severe hyperglycemia, or hypoglycemia.   3). He started his new Medtronic 670G insulin pump and Guardian 3 sensor in September 2017, but later stopped using the sensor and has refused to resume using it.    B. Goiter, Hashimoto's thyroiditis, and hypothyroidism: In late 2008 and early 2009 the patient developed hypothyroidism secondary to Hashimoto's disease. I started him on Synthroid, 25 mcg per day. When he takes his medication, he is euthyroid.  C. Autonomic neuropathy and inappropriate sinus tachycardia: In late 2008 he developed autonomic neuropathy which manifested as inappropriate sinus tachycardia. These problems have varied in parallel with his BGs and HbA1c values.  D. Diabetic peripheral neuropathy: The patient has had symptoms and signs of mild diabetic peripheral neuropathy at several times in the past. His peripheral neuropathic signs and symptoms have similarly varied in parallel with his BGs and HbA1c values.   E. Non-proliferative diabetic retinopathy: This problem was identified during a diabetic eye exam in June 2010.  F. Hypogonadotropic hypogonadism: The patient has always been somewhat lackadaisical and relatively passive. On 06/29/10 his [place] was 4.9, LH  was 2.4, testosterone was 399.66, and free testosterone was 79.8. The testosterone and free testosterone were relatively low for an 31 year old young man. At the same time the [facility] and LH while "normal", appeared to be actually  inappropriately low for the level of testosterone he had. We discussed the options for treatment including exercise and medication, to include AndroGel. The patient chose the exercise option. By 10/19/10 the [facility] was 6.8, the LH was 3.2, total testosterone was 615.88 and the free testosterone was 131.6. In retrospect, when he was more depressed he may have had less gonadotropic function.   G. Depression/Anxiety/Personality disorders:    1). The patient has been diagnosed with depression at different times in the past. For some time he took Remeron (mirtazapine), but discontinued that medication in early 2012. I had been trying to convince him for some time that he was depressed and that he should seek out help. He eventually did decide to seek psychiatric help.    2). On 06/08/13 Scott Moran was evaluated by Scott Moran, a noted psychiatrist in [place].  Dr. [name] and his staff performed neuropsychiatric testing. On the Scott Moran had problems with non-verbal reasoning and emotional intelligence. Dr. 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 Dr. [name] prescribed escitalopram, 10 mg/day. Scott Moran was supposed to continue to see Dr. [name] in follow up. Unfortunately, Akshat did not follow up.    3). Scott Moran was then referred to a therapist, Dr. Celso Moran. Scott Moran enjoyed working with Dr. [name] in the past and felt that he had made progress over time. Unfortunately, Scott Moran has not been back to see Dr. [name] since the summer of 2018. I have been prescribing Scott Moran escitalopram ever since.    H. 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   I. He had his second [place] covid vaccination in May 2021.  3. The patient's last [facility] visit was on 08/10/21. At that visit I continued his insulin pump settings. I also  continued his Synthroid, escitalopram, pravastatin, and lisinopril doses.  A. In the interim he had covid in October, took paxlovid, and has fully recovered. He is not getting much exercise.  B. He graduated from [employer] with an associate's degree. He does not have any immediate plans to continue in college or to seek employment.  C. He is still not using his Guardian 3 sensor. He has been using his Medtronic 770 pump.    Juanda Bond has still not attempted to obtain a PCP, psychiatrist, or therapist. Heinz Knuckles takes his Synthroid dose of 25 mcg/day every night. He thinks he is taking escitalopram 10 mg, lisinopril 2.5 mg daily, and pravastatin 20 mg at night as well.  When I asked about how he actually takes the pills, he has no system to ensure he takes the medications reliably. He is not really sure what he is taking and not taking.   F. He has his learner's permit, but has not wanted to try driving. He is severely afraid of other drivers that are "terrible".  G. He is drinking more water and fewer soft drinks.    4. Pertinent Review of Systems:  Constitutional: Braydyn feels "pretty good overall".  Eyes: Vision is good with his glasses. There are no significant eye complaints. Last eye exam was in July 2021 or sometimes in 2022. Everything looked fine in 2021.  Mouth: No problems. He had a dental exam in February 2022. "Everything looked fine."  Neck: Lyam has no complaints of anterior neck swelling, soreness, tenderness,  pressure, discomfort, or difficulty swallowing.  Heart: Heart rate is faster today. The patient has no complaints of palpitations, irregular heat beats, chest pain, or chest pressure. Gastrointestinal: No postprandial bloating. Bowel movents seem normal. The patient has no other complaints of excessive hunger, acid reflux, upset stomach, stomach aches or pains, diarrhea, or constipation. Hands: He plays video games well.  Legs: Muscle mass and strength seem normal. There are no  complaints of numbness, tingling, burning, or pain. No edema is noted. Feet: There are no obvious foot problems. There are no complaints of numbness, tingling, burning, or pain. No edema is noted. Hypoglycemia: He has not had many low BGs recently.   Emotional/psychological: The patient feels "pretty good".  Diabetes identification: He has his pump. .  5. BG printout:  A. We have data for the past 2 weeks. He is changing his sites every 7 days or longer. He is checking BGs 0-4 times per day, for an average of 2.2 times per day, compared with 2.0 times per day at his last visit. He checked BGs 29 times in 14 days, compared with 28 times at his last visit. He bolused 1-4 times per day, with some of the boluses being both correction doses and food boluses, but some being only food boluses.  B. Average BG is 221, compared with 192 at his last visit and with 240 at his prior visit. BG range is 60->400, compared with 63-320 at his last visit and with 108->400 at his prior visit.   C. His BGs late at night or early in the morning are often higher if he only boluses for carbs at his dinner meal, rather than bolusing for both carbs and BG levels.   PAST MEDICAL, FAMILY, AND SOCIAL HISTORY: Past Medical History:  Diagnosis Date   DM type 1 with diabetic peripheral neuropathy ([place])    Goiter    Hypertension    Hypoglycemia associated with diabetes ([place])    Hypogonadotropic hypogonadism ([facility])    Hypothyroidism, acquired, autoimmune    Tachycardia    Thyroiditis, autoimmune    Type 1 diabetes mellitus not at goal [facility])    Type 1 diabetes mellitus with diabetic autonomic neuropathy ([facility])     Family History  Problem Relation Age of Onset   Diabetes Maternal Uncle    Hypertension Maternal Grandmother    Thyroid disease Neg Hx    Obesity Neg Hx    Kidney disease Neg Hx    Cancer Neg Hx    Anemia Neg Hx      Current Outpatient Medications:    escitalopram (LEXAPRO) 10 MG tablet, TAKE 1 TABLET  BY MOUTH ONCE DAILY, Disp: 30 tablet, Rfl: 4   insulin aspart (NOVOLOG) 100 UNIT/ML injection, INJECT 300 UNITS INTO PUMP EVERY 48-72 HOURS AND PER PROTOCOL FOR HYPERGLYCEMIA, Disp: 40 mL, Rfl: 5   levothyroxine (SYNTHROID) 25 MCG tablet, TAKE 1 TABLET BY MOUTH DAILY BEFORE BREAKFAST, Disp: 30 tablet, Rfl: 5   lisinopril (ZESTRIL) 2.5 MG tablet, TAKE 1 TABLET BY MOUTH ONCE DAILY, Disp: 90 tablet, Rfl: 1   pravastatin (PRAVACHOL) 20 MG tablet, Take one tablet each evening., Disp: 30 tablet, Rfl: 11   acetone, urine, test strip, Check ketones per protocol (Patient not taking: Reported on 03/16/2020), Disp: 50 each, Rfl: 3   glucagon 1 MG injection, Inject 1 mg  intramuscularly in thigh 1 time for severe hypoglycemia if unresponsive, unconscious, unable to swallow or has a seizure. (Patient not taking: Reported on 05/10/2021), Disp: 2 kit, Rfl: 3   glucose blood (BAYER CONTOUR NEXT TEST) test strip, Check sugar 10 x daily (Patient not taking: Reported on 06/15/2020), Disp: 300 each, Rfl: 3   insulin lispro (HUMALOG) 100 UNIT/ML injection, Up to 300 units of insulin in insulin pump every 48-72 hours pre DKA and Hyperglycemia protocols (Patient not taking: Reported on 08/13/2019), Disp: 40 mL, Rfl: 5   ondansetron (ZOFRAN ODT) 4 MG disintegrating tablet, Take 1 tablet (4 mg total) by mouth every 8 (eight) hours as needed for nausea or vomiting. (Patient not taking: Reported on 08/13/2019), Disp: 20 tablet, Rfl: 0   pravastatin (PRAVACHOL) 10 MG tablet, Take 1 tablet (10 mg total) by mouth every evening. Take 20 mg by mouth daily (Patient not taking: Reported on 08/10/2021), Disp: 180 tablet, Rfl: 3   promethazine (PHENERGAN) 25 MG tablet, Take 1 tablet (25 mg total) by mouth every 6 (six) hours as needed for nausea or vomiting. (Patient not taking: Reported on 08/13/2019), Disp: 20 tablet, Rfl: 0  Allergies as of 12/05/2021   (No Known Allergies)    1. Work and Family: Scott Moran is still spending a lot of time on  the web.  2. Activities: Few physical activities. He is a history and politics buff. He frequently participates in online forum discussions about historical and political topics. He was thinking at one time about becoming a history teacher or a welder, but he has no plant to take more college classes or to find work. He says he is too afraid of getting covid to return to college or to look for work. His father retired in October 2022. 3. Smoking, alcohol, or drugs: None 4. Primary Care Provider: None  5. Psychiatry: None  6. Psychologist: None   REVIEW OF SYSTEMS: There are no other significant problems involving Scott Moran's other body systems.   Objective:  Vital Signs:  BP 120/76 (BP Location: Right Arm, Patient Position: Sitting, Cuff Size: Normal)    Pulse 68    Wt 159 lb (72.1 kg)    BMI 24.52 kg/m     Ht Readings from Last 3 Encounters:  09/19/20 5' 7.52" (1.715 m)  08/13/19 5' 7.13" (1.705 m)  01/27/19 5' 7.17" (1.706 m)   Wt Readings from Last 3 Encounters:  12/05/21 159 lb (72.1 kg)  08/10/21 157 lb 6.4 oz (71.4 kg)  05/10/21 161 lb 6.4 oz (73.2 kg)   PHYSICAL EXAM:  Constitutional: Scott Moran looked healthy and happy today. His weight has increased almost 2 pounds since last visit. He was alert and bright. His affect was very upbeat and positive. His insight was good today. He was very chatty about the world and national political situations.   Head: His tendency to baldness is progressing. Face: The face appears normal.  Eyes: There is no obvious arcus or proptosis. Moisture appears normal. Mouth: The oropharynx and tongue appear normal. Oral moisture is normal. Neck: The neck looks normal. No carotid bruits are noted. The thyroid gland is top-normal in size at about 20 grams. The consistency of the thyroid gland is normal today. The thyroid gland is not tender to palpation.  Lungs: The lungs are clear to auscultation. Air movement is good. Heart: Heart rate remains  borderline tachycardic. Heart rhythm is regular. Heart sounds S1 and S2 are normal. I did not appreciate any pathologic cardiac murmurs. Abdomen:  The abdomen is more enlarged. Bowel sounds are normal. There is no obvious hepatomegaly, splenomegaly, or other mass effect.  Arms: Muscle size and bulk are normal for age. Hands: There is no obvious tremor. Phalangeal and metacarpophalangeal joints are normal. Palmar muscles are normal. Palmar skin is normal. Palmar moisture is also normal. Legs: Muscles appear normal for age. No edema is present. Feet: Feet are normally formed. Dorsalis pedal pulses are 1+ on the left and 1+ on the right.   Neurologic: Strength is normal for age in both the upper and lower extremities. Muscle tone is normal. Sensation to touch is normal in both legs, but slightly decreased in both heels.      LAB DATA:   Results for orders placed or performed in visit on 12/05/21 (from the past 504 hour(s))  POCT Glucose (Device for Home Use)   Collection Time: 12/05/21  2:09 PM  Result Value Ref Range   Glucose Fasting, POC     POC Glucose 390 (A) 70 - 99 mg/dl  POCT glycosylated hemoglobin (Hb A1C)   Collection Time: 12/05/21  2:15 PM  Result Value Ref Range   Hemoglobin A1C 7.9 (A) 4.0 - 5.6 %   HbA1c POC (<> result, manual entry)     HbA1c, POC (prediabetic range)     HbA1c, POC (controlled diabetic range)    POCT urinalysis dipstick   Collection Time: 12/05/21  2:15 PM  Result Value Ref Range   Color, UA     Clarity, UA     Glucose, UA Positive (A) Negative   Bilirubin, UA     Ketones, UA negative    Spec Grav, UA     Blood, UA     pH, UA     Protein, UA     Urobilinogen, UA     Nitrite, UA     Leukocytes, UA     Appearance     Odor     Labs 12/05/21: HbA1c 7.9%, CBG 390  Labs 08/10/21: HbA1c 8.4%, CBG 153; cholesterol 163, triglycerides 83, HDL 51, LDL 139  Labs 05/10/21: HbA1c 8.9%, CBG 192  Labs 05/05/21: TSH 2.8, free T4 1.4, free T3 3.5; CMP  normal; Labs 01/30/21: HbA1c 8.3%, CBG 198  Labs 09/19/20: HbA1c 8.3%, CBG 146  Labs 06/15/20: HbA1c 8.3%, CBG 209; TSH 2.42, free T4 1.2, free T3 2.8; CMP normal, except glucose 224, with sodium artifactually low at 134 and chloride artifactually low at 97; cholesterol 189, triglycerides 90, HDL 52, LDL 110; LH 2.4, FSH 6.7, testosterone 612; urinary microalbumin/creatinine ratio 11  Labs 03/16/20: HbA1c 8.9%, CBG 142  Labs 11/12/19: HbA1c 7.5%, CBG 222; HbA1c is lower, but at the cost of too many low BGs.  Labs 08/13/19: HbA1c 8.7%, CBG 264  Labs 07/15/19: TSH 1.90, free T4 1.2, free T3 2.8; CMP normal, except glucose 180; CBC normal; cholesterol 177, triglycerides 65, HDL 56, LDL 106; microalbumin/creatinine ratio 3; LH 3.5, FSH 7.7, testosterone 495 (ref 250-827), free testosterone 68.1 (ref 46-224)  Labs 04/28/19: HbA1c 7.7%, CBG 394; U/A positive glucose and moderate ketones  Labs 01/27/19: HbA1c 7.4%, CBG 231  Labs 10/27/18: HbA1c 8.0%, CBG 250  Labs 07/21/18: HbA1c 8.2%, CBG 166  Labs 07/15/18: TSH 1.54, free T4 1.2, free T3 3.5; CMP normal; cholesterol 176, triglycerides 74, HDL 53, LDL 107; urinary microalbumin/creatinine ratio 6  Labs 04/21/18: HbA1c 8.2%, CBG 295  Labs 01/20/18: HbA1c 8.4%, CBG 206  Labs 10/30/17: HbA1c 9.0%, CBG 147  Labs 08/13/17: CBG 354  Labs 06/04/17: CBG 173; BMP normal  Labs 05/29/17: HbA1c 8.2%; TSH 1.84, free T4 1.2, free T3 3.4, CMP normal except for glucose 310 and potassium 5.8; LH 3.7, FSH 5.9, testosterone 634, free testosterone 76 (ref 46-224), estradiol 31; microalbumin/creatinine ratio 4; cholesterol 187, triglycerides 63, HDL 59, LDL 116  Labs 04/03/17: CBG 182  Labs 01/31/17: HbA1c 7.6%, CBG 341  Labs 10/30/16: HbA1c 7.5%  Labs 07/25/16: HbA1c 7.8%  Labs 04/25/16: HbA1c 7.7%  Labs 04/09/16: LH 3.4, FSH 6.0, testosterone 504, free testosterone 85.5 (ref 47-244); cholesterol 176, triglycerides 92, HDL 80, LDL 88; CMP norma except for glucose  224; TSH 1.75, free T4 1.2, free T3 3.2; microalbumin/creatinine ratio 4  Labs 01/24/16: HbA1c 8.6%  Labs 10/20/15: HbA1c 8.1%  Labs 09/15/15: BMP normal except for glucose of 143 and calcium 8.3.  Labs 07/20/15: HbA1c 8.2%  Labs 03/25/15: HbA1c 7.8%  Labs 12/02/14: HbA1c 8.5%;  LH 4.0, FSH 8.2, testosterone 621.97; CMP normal; TSH 2.01, free T4 1.21, free T3 3.6; cholesterol 155, triglycerides 46, HDL  55, LDL 91; microalbumin/creatinine ratio 6.6  Labs 03/30/14: TSH 1.935, free T4 1.12, free T3 3.2; CMP: normal except for glucose 203; testosterone 624, FSH 6.3, LH 2.4; urine microalbumin/creatinine ratio 6.4; cholesterol 163, triglycerides 55, HDL 64, LDL 80  Labs 08/23/13: TSH 1.139; CMP normal except for a glucose of 145; CBC: WBC 5.4, Hgb 12.7, Hct 34.7%   Assessment and Plan:   ASSESSMENT:  1-2. Type 1 diabetes mellitus/hypoglycemia:  A. Scott Moran's HbA1c has decreased from 8.4% to 7.9%. He is taking better care of his DM now, but still not as well as he was doing in 2020.  On some days he does better at checking BGs and giving himself insulin boluses, but on other days he does not do well.  B. He had one documented low BG of 60, but can't remember the circumstances.    C. He continues to be inconsistently inconsistent in taking care of his T1DM.  3. Autonomic neuropathy and inappropriate sinus tachycardia: These problems are better.  4. Peripheral neuropathy: This problem is evident again today, but is very mild.    5. Hypothyroid: He was euthyroid in December 2015, in May 2017, in June 2018, in August 2019, in August 2020, in July 2021, and in June 2022 on his current dose of Synthroid.  6. Hypogonadotropic hypogonadism: His testosterone level in May 2017 was acceptable, but was lower than in December 2015. His testosterone level in June 2018 was good, but his free testosterone was relatively low for his age.  His testosterone in August 2020 was within normal limits, but still  relatively low for his age. His testosterone level in July 2021 was higher.   7. Hypertension: His BP is good today. He needs to take his lisinopril and exercise regularly.  8. Goiter/Hashimoto's disease: His goiter is now normal in size. The pattern of waxing and waning of thyroid gland size is c/w evolving thyroiditis. The thyroiditis is clinically quiescent 9. Depression: This problem is much better today.  He says that he is taking the escitalopram reliably. He does not exhibit any suicidal thoughts or emotions.  Since he does not have a psychiatrist or a PCP to prescribe escitalopram, I have agreed to prescribe it for him until he can find a new psychiatrist or PCP. Unfortunately, he is not interested in finding another PCP or psychiatrist, so I have continued to prescribe the escitalopram for him.   10. Hypercholesterolemia: His cholesterol levels were  elevated in August 2019, in August 2020, in July 2021, and in September 2022. I increased his  pravastatin dose then to 20 mg/day. Exercise and more consistent BG control would help. Unfortunately, it is unclear whether he is actually taking the pravastatin reliably or not. We need to increase his pravastatin dose to 30 mg/day if he is taking the pravastatin .  11. Noncompliance: He is doing somewhat better at taking care of his DM. He needs to be more consistent with BG testing and bolusing. I continue to hope that as Scott Moran ages and matures, he will do a better job of taking care of his T1DM. At this time, however, he remains consistently inconsistent.      PLAN:  1. Diagnostic: I reviewed his BG printout. I ordered annual surveillance labs in fasting lipids to be done in 3 months. Call us tomorrow with info about what medications he is actually taking.  2. Therapeutic: Continue Synthroid, 25 mcg/day, escitalopram, 10 mg/day, lisinopril 2.5 mg/day. Increase pravastatin to 30 mg/day if he is actually taking the 20 mg/day. Give both correction boluses  and food boluses. We continued his current pump settings:  Continue Basal rates:   MN: 0.75 5 AM: 0.75 8 AM:  0.70 Noon: 0.950 6 PM: 1.65 9 PM: 1.35  Continue BG targets:  MN: 150 8 AM: 110 10 PM: 150  Continue ISF: 65  Continue ICRs: MN: 15 8 AM: 11 4:30 PM: 15 3. Patient education: Isac is a very intelligent young man who knows intellectually what he should do and how to do it. Unfortunately, he still lacks the commitment to be as consistent with his T1DM care as he needs to be. If he will start using the Guardian 3 sensor and take advantage of all the benefits of the Medtronic 670G pump and sensor combination, then his BG control can be much better. 4. Follow-up: 3 months with me  Level of Service: This visit lasted in excess of 60  minutes. More than 50% of the visit was devoted to counseling.  [name] Sers, MD, CDE Adult and Pediatric Endocrinology 12/05/2021 2:44 PM

## 2021-12-05 NOTE — Patient Instructions (Signed)
Follow up visit in 3 months. Please have fasting lab tests done 1-2 weeks prior.  

## 2021-12-06 ENCOUNTER — Telehealth (INDEPENDENT_AMBULATORY_CARE_PROVIDER_SITE_OTHER): Payer: Self-pay | Admitting: "Endocrinology

## 2021-12-06 MED ORDER — PRAVASTATIN SODIUM 20 MG PO TABS: ORAL_TABLET | ORAL | 11 refills | Status: DC

## 2021-12-06 NOTE — Telephone Encounter (Signed)
°  Who's calling (name and relationship to patient) : Scott Moran - self  Best contact number: [phone]  Provider they see: Dr. Fransico Cross  Reason for call: Patient states that Dr. Fransico Akili wanted him to check the dosage on his pravastatin. Patient states that he has 10mg  tablets at home and does not mind doubling those for the 20mg  that Dr. would like him on, but he thinks Dr. wanted him to take 20mg  tablets. Please call patent to clarify and if needed, please send RX to pharmacy below.   PRESCRIPTION REFILL ONLY  Name of prescription: pravastatin  Pharmacy:  [facility] - [place] - [address]

## 2021-12-06 NOTE — Telephone Encounter (Signed)
Spoke to [name] let him know Dr Tobe Sos ok'd him to take the 20mg  Script sent in.

## 2021-12-09 ENCOUNTER — Other Ambulatory Visit (INDEPENDENT_AMBULATORY_CARE_PROVIDER_SITE_OTHER): Payer: Self-pay | Admitting: "Endocrinology

## 2022-02-08 ENCOUNTER — Other Ambulatory Visit (INDEPENDENT_AMBULATORY_CARE_PROVIDER_SITE_OTHER): Payer: Self-pay | Admitting: "Endocrinology

## 2022-03-05 NOTE — Progress Notes (Signed)
? Subjective:  ?Patient Name: Scott Moran Date of Birth: Feb 14, 1991  MRN: [id number] ? ?Scott Moran  presents to the office today for follow-up of his type 1 diabetes mellitus, goiter, hypoglycemia, depression, social anxiety disorder, avoidant personality disorder, hypothyroidism, thyroiditis, diabetic autonomic neuropathy, inappropriate sinus tachycardia, non-proliferative diabetic retinopathy, diabetic peripheral neuropathy, fatigue, hypogonadotropic hypogonadism, and medical non-compliance. ? ?HISTORY OF PRESENT ILLNESS:  ? ? ?Scott Moran is a 31 y.o. Caucasian young man. Scott Moran was unaccompanied.  ? ?1. I first saw the patient in consultation on 03/27/05 in the [facility] in [place], [place]. He had been referred by his primary care provider, [name], in [place], [place], for evaluation and treatment of poorly controlled type 1 diabetes. The patient was 32 years old. ? A. The patient had been diagnosed with type 1 diabetes in February of 1995 at age 60. He was on an insulin pump when I first met him. His hemoglobin A1c had increased from 7.1% in December of 2005 to 8.2% in April 2006. He was having frequent snacks that he was not taking boluses for. He was also frequently noncompliant with checking blood sugars and taking meal boluses. I adjusted his basal rates. ? B. At his next [place] visit on 06/19/05, he was still very noncompliant. His mental status was normal. He had a 25 gram goiter. His father was serving in [place] with the [employer]. There was a lot of stress at home. I again adjusted his basal rates. ? C. At the end of July 2006 we closed our satellite office in [place]. The patient elected to be followed at our clinic here in [place]. I have followed him here ever since. ? ?2. During the past seventeen years, the patient has had several clinical issues.  ? A. T1DM:  ?  1). The patient's blood glucose control has usually been fairly  good for a teenager/young adult. His maximum hemoglobin A1c occurred on 04/16/06 when the hemoglobin A1c was 12.1%. Since then the hemoglobin A1c values have varied between 6.9% and 8.6%, until his HbA1c on 10/30/17 of 9.0%. Since then his HbA1c has varied from 7.4% to 8.4%.  ?  2). He is still often noncompliant with checking blood sugars, bolusing, and changing his insulin pump sites on time, but he is more compliant and more careful than he used to be. He has never required hospital re-admission for DKA, severe hyperglycemia, or hypoglycemia. ?  3). He started his new Medtronic 670G insulin pump and Guardian 3 sensor in September 2017, but later stopped using the sensor and has refused to resume using it.   ? B. Goiter, Hashimoto's thyroiditis, and hypothyroidism: In late 2008 and early 2009 the patient developed hypothyroidism secondary to Hashimoto's disease. I started him on Synthroid, 25 mcg per day. When he takes his medication, he is euthyroid. ? C. Autonomic neuropathy and inappropriate sinus tachycardia: In late 2008 he developed autonomic neuropathy which manifested as inappropriate sinus tachycardia. These problems have varied in parallel with his BGs and HbA1c values. ? D. Diabetic peripheral neuropathy: The patient has had symptoms and signs of mild diabetic peripheral neuropathy at several times in the past. His peripheral neuropathic signs and symptoms have similarly varied in parallel with his BGs and HbA1c values.  ? E. Non-proliferative diabetic retinopathy: This problem was identified during a diabetic eye exam in June 2010. ? F. Hypogonadotropic hypogonadism: The patient has always been somewhat lackadaisical and relatively passive. On 06/29/10 his [place] was 4.9,  LH was 2.4, testosterone was 399.66, and free testosterone was 79.8. The testosterone and free testosterone were relatively low for an 31 year old young man. At the same time the [facility] and LH while "normal", appeared to be actually  inappropriately low for the level of testosterone he had. We discussed the options for treatment including exercise and medication, to include AndroGel. The patient chose the exercise option. By 10/19/10 the [facility] was 6.8, the LH was 3.2, total testosterone was 615.88 and the free testosterone was 131.6. In retrospect, when he was more depressed he may have had less gonadotropic function.  ? G. Depression/Anxiety/Personality disorders:  ?  1). The patient has been diagnosed with depression at different times in the past. For some time he took Remeron (mirtazapine), but discontinued that medication in early 2012. I had been trying to convince him for some time that he was depressed and that he should seek out help. He eventually did decide to seek psychiatric help.  ?  2). On 06/08/13 Scott Moran was evaluated by [name], a noted psychiatrist in [place].  [name] and his staff performed neuropsychiatric testing. On the RUMI TARAS had problems with non-verbal reasoning and emotional intelligence. [name] diagnosed Scott Como with Social Anxiety Disorder and Avoidant Personality Disorder. [name] prescribed escitalopram, 10 mg/day. Scott Moran was supposed to continue to see [name] in follow up. Unfortunately, Scott Moran did not follow up.  ?  3). Scott Moran was then referred to a therapist, [name]. Sota enjoyed working with Dr. [name] in the past and felt that he had made progress over time. Unfortunately, [place] has not been back to see Dr. [name] since the summer of 2018. I have been prescribing Scott Moran's escitalopram ever since.   ? H. 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  ? I. He had his second [place] covid vaccination in May 2021. ? H. he had covid in October, took paxlovid, and has fully recovered ? ?3. The patient's last endocrine clinic  visit was on 12/05/21. At that visit I continued his insulin pump settings. I also continued his Synthroid, escitalopram, and lisinopril doses. I increased his pravastatin dose to 20 mg/day. I ordered annual surveillance lab tests, but they were not done until this afternoon in a non-fasting state.   ? A. In the interim he has been healthy. He had one bad site in the past week, causing higher BGs to the 400s and 500s. He is not getting much exercise. ? B. He graduated from [employer] with an associate's degree. He does not have any immediate plans to continue in college or to seek employment. ? C. He is still not using his Guardian 3 sensor. He has been using his Medtronic 770 pump.   ? D. Scott Moran has still not attempted to obtain a PCP, psychiatrist, or therapist. ?E. Scott Moran takes his Synthroid dose of 25 mcg/day every night. He thinks he is taking escitalopram 10 mg, lisinopril 2.5 mg daily, and pravastatin 20 mg at night as well.  When I asked about how he actually takes the pills, he has no system to ensure he takes the medications reliably. He is not really sure what he is taking and not taking.  ? F. He has his learner's permit, but has not wanted to try driving. He is severely afraid of other drivers that are "terrible". ? G. He is drinking  more water and fewer soft drinks.  ?  ?4. Pertinent Review of Systems:  ?Constitutional: Scott Moran feels "much better when my sugars are better".   ?Eyes: Vision is good with his glasses. There are no significant eye complaints. Last eye exam was in July 2021 or sometimes in 2022. Everything looked fine in 2021.  ?Mouth: No problems. He had a dental exam in February 2022. "Everything looked fine."  ?Neck: Scott Moran has no complaints of anterior neck swelling, soreness, tenderness,  pressure, discomfort, or difficulty swallowing.  ?Heart: Heart rate is faster today. The patient has no complaints of palpitations, irregular heat beats, chest pain, or chest pressure. ?Gastrointestinal:  No postprandial bloating. Bowel movents seem normal. The patient has no other complaints of excessive hunger, acid reflux, upset stomach, stomach aches or pains, diarrhea, or constipation. ?Hands: He plays video ga

## 2022-03-06 ENCOUNTER — Other Ambulatory Visit (INDEPENDENT_AMBULATORY_CARE_PROVIDER_SITE_OTHER): Payer: Self-pay

## 2022-03-06 ENCOUNTER — Ambulatory Visit (INDEPENDENT_AMBULATORY_CARE_PROVIDER_SITE_OTHER): Payer: BC Managed Care – PPO | Admitting: "Endocrinology

## 2022-03-06 ENCOUNTER — Encounter (INDEPENDENT_AMBULATORY_CARE_PROVIDER_SITE_OTHER): Payer: Self-pay | Admitting: "Endocrinology

## 2022-03-06 VITALS — BP 110/68 | HR 74 | Wt 159.8 lb

## 2022-03-06 DIAGNOSIS — F320 Major depressive disorder, single episode, mild: Secondary | ICD-10-CM

## 2022-03-06 DIAGNOSIS — E1042 Type 1 diabetes mellitus with diabetic polyneuropathy: Secondary | ICD-10-CM

## 2022-03-06 DIAGNOSIS — E1065 Type 1 diabetes mellitus with hyperglycemia: Secondary | ICD-10-CM

## 2022-03-06 DIAGNOSIS — E10649 Type 1 diabetes mellitus with hypoglycemia without coma: Secondary | ICD-10-CM | POA: Diagnosis not present

## 2022-03-06 DIAGNOSIS — E063 Autoimmune thyroiditis: Secondary | ICD-10-CM | POA: Diagnosis not present

## 2022-03-06 DIAGNOSIS — E7800 Pure hypercholesterolemia, unspecified: Secondary | ICD-10-CM

## 2022-03-06 DIAGNOSIS — E1043 Type 1 diabetes mellitus with diabetic autonomic (poly)neuropathy: Secondary | ICD-10-CM

## 2022-03-06 DIAGNOSIS — E049 Nontoxic goiter, unspecified: Secondary | ICD-10-CM

## 2022-03-06 DIAGNOSIS — Z91199 Patient's noncompliance with other medical treatment and regimen due to unspecified reason: Secondary | ICD-10-CM

## 2022-03-06 DIAGNOSIS — I10 Essential (primary) hypertension: Secondary | ICD-10-CM | POA: Diagnosis not present

## 2022-03-06 LAB — POCT GLUCOSE (DEVICE FOR HOME USE): POC Glucose: 161 mg/dl — AB (ref 70–99)

## 2022-03-06 NOTE — Patient Instructions (Addendum)
Follow up visit in 3 months.   At [facility], we are committed to providing exceptional care. You will receive a patient satisfaction survey through text or email regarding your visit today. Your opinion is important to me. Comments are appreciated.   

## 2022-03-07 LAB — COMPREHENSIVE METABOLIC PANEL
AG Ratio: 1.5 (calc) (ref 1.0–2.5)
ALT: 30 U/L (ref 9–46)
AST: 23 U/L (ref 10–40)
Albumin: 4.3 g/dL (ref 3.6–5.1)
Alkaline phosphatase (APISO): 80 U/L (ref 36–130)
BUN: 9 mg/dL (ref 7–25)
CO2: 30 mmol/L (ref 20–32)
Calcium: 9.3 mg/dL (ref 8.6–10.3)
Chloride: 102 mmol/L (ref 98–110)
Creat: 0.92 mg/dL (ref 0.60–1.26)
Globulin: 2.9 g/dL (calc) (ref 1.9–3.7)
Glucose, Bld: 197 mg/dL — ABNORMAL HIGH (ref 65–99)
Potassium: 4.1 mmol/L (ref 3.5–5.3)
Sodium: 138 mmol/L (ref 135–146)
Total Bilirubin: 0.6 mg/dL (ref 0.2–1.2)
Total Protein: 7.2 g/dL (ref 6.1–8.1)

## 2022-03-07 LAB — LIPID PANEL
Cholesterol: 161 mg/dL (ref <200)
HDL: 55 mg/dL (ref >=40)
LDL Cholesterol (Calc): 86 mg/dL (calc)
Non-HDL Cholesterol (Calc): 106 mg/dL (calc) (ref <130)
Total CHOL/HDL Ratio: 2.9 (calc) (ref <5.0)
Triglycerides: 106 mg/dL (ref <150)

## 2022-03-07 LAB — HEMOGLOBIN A1C
Hgb A1c MFr Bld: 8.1 % of total Hgb — ABNORMAL HIGH (ref <5.7)
Mean Plasma Glucose: 186 mg/dL
eAG (mmol/L): 10.3 mmol/L

## 2022-03-07 LAB — MICROALBUMIN / CREATININE URINE RATIO
Creatinine, Urine: 258 mg/dL (ref 20–320)
Microalb Creat Ratio: 6 mcg/mg creat (ref <30)
Microalb, Ur: 1.5 mg/dL

## 2022-03-07 LAB — T4, FREE: Free T4: 1.1 ng/dL (ref 0.8–1.8)

## 2022-03-07 LAB — TSH: TSH: 1.39 mIU/L (ref 0.40–4.50)

## 2022-03-07 LAB — T3, FREE: T3, Free: 3.7 pg/mL (ref 2.3–4.2)

## 2022-06-01 DIAGNOSIS — Z794 Long term (current) use of insulin: Secondary | ICD-10-CM | POA: Diagnosis not present

## 2022-06-01 DIAGNOSIS — E1065 Type 1 diabetes mellitus with hyperglycemia: Secondary | ICD-10-CM | POA: Diagnosis not present

## 2022-06-06 NOTE — Progress Notes (Unsigned)
Subjective:  Patient Name: Scott Moran Date of Birth: 1991-10-25  MRN: [id number]  Scott Moran  presents to the office today for follow-up of his type 1 diabetes mellitus, goiter, hypoglycemia, depression, social anxiety disorder, avoidant personality disorder, hypothyroidism, thyroiditis, diabetic autonomic neuropathy, inappropriate sinus tachycardia, non-proliferative diabetic retinopathy, diabetic peripheral neuropathy, fatigue, hypogonadotropic hypogonadism, and medical non-compliance.  HISTORY OF PRESENT ILLNESS:    Scott Moran is a 31 y.o. Caucasian young man. Scott Moran was unaccompanied.   1. I first saw the patient in consultation on 03/27/05 in the [facility]Patient [facility] of [facility] in [place], [place]. He had been referred by his primary care provider, Dr. Egbert Moran, in [place], [place], for evaluation and treatment of poorly controlled type 1 diabetes. The patient was 31 years old.  A. The patient had been diagnosed with type 1 diabetes in February of 1995 at age 25. He was on an insulin pump when I first met him. His hemoglobin A1c had increased from 7.1% in December of 2005 to 8.2% in April 2006. He was having frequent snacks that he was not taking boluses for. He was also frequently noncompliant with checking blood sugars and taking meal boluses. I adjusted his basal rates.  B. At his next [facility] visit on 06/19/05, he was still very noncompliant. His mental status was normal. He had a 25 gram goiter. His father was serving in [place] with the [employer]. There was a lot of stress at home. I again adjusted his basal rates.  C. At the end of July 2006 we closed our satellite office in [place]. The patient elected to be followed at our clinic here in [place]. I have followed him here ever since.  2. During the past seventeen years, the patient has had several clinical issues.   A. T1DM:    1). The patient's blood glucose control has usually been fairly  good for a teenager/young adult. His maximum hemoglobin A1c occurred on 04/16/06 when the hemoglobin A1c was 12.1%. Since then the hemoglobin A1c values have varied between 6.9% and 8.6%, until his HbA1c on 10/30/17 of 9.0%. Since then his HbA1c has varied from 7.4% to 8.4%.    2). He is still often noncompliant with checking blood sugars, bolusing, and changing his insulin pump sites on time, but he is more compliant and more careful than he used to be. He has never required hospital re-admission for DKA, severe hyperglycemia, or hypoglycemia.   3). He started his new Medtronic 670G insulin pump and Guardian 3 sensor in September 2017, but later stopped using the sensor and has refused to resume using it.    B. Goiter, Hashimoto's thyroiditis, and hypothyroidism: In late 2008 and early 2009 the patient developed hypothyroidism secondary to Hashimoto's disease. I started him on Synthroid, 25 mcg per day. When he takes his medication, he is euthyroid.  C. Autonomic neuropathy and inappropriate sinus tachycardia: In late 2008 he developed autonomic neuropathy which manifested as inappropriate sinus tachycardia. These problems have varied in parallel with his BGs and HbA1c values.  D. Diabetic peripheral neuropathy: The patient has had symptoms and signs of mild diabetic peripheral neuropathy at several times in the past. His peripheral neuropathic signs and symptoms have similarly varied in parallel with his BGs and HbA1c values.   E. Non-proliferative diabetic retinopathy: This problem was identified during a diabetic eye exam in June 2010.  F. Hypogonadotropic hypogonadism: The patient has always been somewhat lackadaisical and relatively passive. On 06/29/10 his FSH was 4.9,  LH was 2.4, testosterone was 399.66, and free testosterone was 79.8. The testosterone and free testosterone were relatively low for an 31 year old young man. At the same time the [facility] and LH while "normal", appeared to be actually  inappropriately low for the level of testosterone he had. We discussed the options for treatment including exercise and medication, to include AndroGel. The patient chose the exercise option. By 10/19/10 the [facility] was 6.8, the LH was 3.2, total testosterone was 615.88 and the free testosterone was 131.6. In retrospect, when he was more depressed he may have had less gonadotropic function.   G. Depression/Anxiety/Personality disorders:    1). The patient has been diagnosed with depression at different times in the past. For some time he took Remeron (mirtazapine), but discontinued that medication in early 2012. I had been trying to convince him for some time that he was depressed and that he should seek out help. He eventually did decide to seek psychiatric help.    2). On 06/08/13 Scott Moran was evaluated by Dr. Carlena Moran, a noted psychiatrist in [place].  Dr. [name] and his staff performed neuropsychiatric testing. On the Scott Moran had problems with non-verbal reasoning and emotional intelligence. Dr. 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 Dr. [name] prescribed escitalopram, 10 mg/day. Scott Moran was supposed to continue to see Dr. [name] in follow up. Unfortunately, Scott Moran did not follow up.    3). Scott Moran was then referred to a therapist, Scott Moran. Scott Moran enjoyed working with Dr. [name] in the past and felt that he had made progress over time. Unfortunately, Scott Moran has not been back to see Dr. [name] since the summer of 2018. I have been prescribing Scott Moran's escitalopram ever since.    H. 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   I. He had his second [facility] covid vaccination in May 2021.  H. he had covid in October, took paxlovid, and has fully recovered  3. The patient's last endocrine clinic  visit was on 03/06/22. At that visit I continued his insulin pump settings. I also continued his Synthroid, escitalopram, lisinopril doses, and his pravastatin dose of 20 mg/day. I ordered annual surveillance lab tests, but they were not done until this afternoon in a non-fasting state.    A. In the interim he has been healthy. He had one bad site in the past week, causing higher BGs to the 400s and 500s. He is not getting much exercise.  B. He graduated from [employer] with an associate's degree. He does not have any immediate plans to continue in college or to seek employment.  C. He is still not using his Guardian 3 sensor. He has been using his Medtronic 770 pump.    Juanda Bond has still not attempted to obtain a PCP, psychiatrist, or therapist. Heinz Knuckles takes his Synthroid dose of 25 mcg/day every night. He thinks he is taking escitalopram 10 mg, lisinopril 2.5 mg daily, and pravastatin 20 mg at night as well.  When I asked about how he actually takes the pills, he has no system to ensure he takes the medications reliably. He is not really sure what he is taking and not taking.   F. He has his learner's permit, but has not wanted to try driving. He is severely afraid of other drivers that are "terrible".  G. He is drinking more water  and fewer soft drinks.    4. Pertinent Review of Systems:  Constitutional: Yanis feels "much better when my sugars are better".   Eyes: Vision is good with his glasses. There are no significant eye complaints. Last eye exam was in July 2021 or sometimes in 2022. Everything looked fine in 2021.  Mouth: No problems. He had a dental exam in February 2022. "Everything looked fine."  Neck: Mcdonald has no complaints of anterior neck swelling, soreness, tenderness,  pressure, discomfort, or difficulty swallowing.  Heart: Heart rate is faster today. The patient has no complaints of palpitations, irregular heat beats, chest pain, or chest pressure. Gastrointestinal: No  postprandial bloating. Bowel movents seem normal. The patient has no other complaints of excessive hunger, acid reflux, upset stomach, stomach aches or pains, diarrhea, or constipation. Hands: He plays video games well.  Legs: Muscle mass and strength seem normal. There are no complaints of numbness, tingling, burning, or pain. No edema is noted. Feet: There are no obvious foot problems. There are no complaints of numbness, tingling, burning, or pain. No edema is noted. Hypoglycemia: He has not had many low BGs recently.   Emotional/psychological: The patient feels "pretty good".  Diabetes identification: He has his pump. .  5. BG printout:  A. We have data for the past 2 weeks. He is changing his sites every 7 days or longer. He checked BGs on 12 of 14 days, from 0-7 times per day, for a total of 39 times, for an average of 2.8 times per day, compared with 2.2 times per day at his last visit. He bolused 1-9 times per day, with some of the boluses being both correction doses and food boluses, but some being only food boluses.  B. Average BG is 259, compared with 221 at his last visit and with 192 at his prior visit. BG range is 54->400, compared with 60->400 at his last visit and with 63-320 at his prior visit.  When his sites were bad he had 5 BGs >400 in two days.  C. When he checks his BGs, takes his insulins, and changes his sites frequently enough, he can have good BGs.    PAST MEDICAL, FAMILY, AND SOCIAL HISTORY: Past Medical History:  Diagnosis Date   DM type 1 with diabetic peripheral neuropathy (HCC)    Goiter    Hypertension    Hypoglycemia associated with diabetes (HCC)    Hypogonadotropic hypogonadism (HCC)    Hypothyroidism, acquired, autoimmune    Tachycardia    Thyroiditis, autoimmune    Type 1 diabetes mellitus not at goal [facility])    Type 1 diabetes mellitus with diabetic autonomic neuropathy (HCC)     Family History  Problem Relation Age of Onset   Diabetes Maternal  Uncle    Hypertension Maternal Grandmother    Thyroid disease Neg Hx    Obesity Neg Hx    Kidney disease Neg Hx    Cancer Neg Hx    Anemia Neg Hx      Current Outpatient Medications:    acetone, urine, test strip, Check ketones per protocol (Patient not taking: Reported on 03/16/2020), Disp: 50 each, Rfl: 3   escitalopram (LEXAPRO) 10 MG tablet, TAKE 1 TABLET BY MOUTH ONCE DAILY, Disp: 30 tablet, Rfl: 4   glucagon 1 MG injection, Inject 1 mg intramuscularly in thigh 1 time for severe hypoglycemia if unresponsive, unconscious, unable to swallow or has a seizure. (Patient not taking: Reported on 05/10/2021), Disp: 2 kit, Rfl: 3  glucose blood (BAYER CONTOUR NEXT TEST) test strip, Check sugar 10 x daily (Patient not taking: Reported on 06/15/2020), Disp: 300 each, Rfl: 3   insulin aspart (NOVOLOG) 100 UNIT/ML injection, INJECT 300 UNITS INTO PUMP EVERY 48-72 HOURS AND PER PROTOCOL FOR HYPERGLYCEMIA, Disp: 40 mL, Rfl: 5   insulin lispro (HUMALOG) 100 UNIT/ML injection, Up to 300 units of insulin in insulin pump every 48-72 hours pre DKA and Hyperglycemia protocols (Patient not taking: Reported on 08/13/2019), Disp: 40 mL, Rfl: 5   levothyroxine (SYNTHROID) 25 MCG tablet, TAKE 1 TABLET BY MOUTH DAILY BEFORE BREAKFAST, Disp: 30 tablet, Rfl: 5   lisinopril (ZESTRIL) 2.5 MG tablet, TAKE 1 TABLET BY MOUTH ONCE DAILY, Disp: 90 tablet, Rfl: 1   NOVOLOG 100 UNIT/ML injection, INJECT 300 UNITS INTO PUMP EVERY 48-72 HOURS AND PER PROTOCOL FOR HYPERGLYCEMIA (Patient not taking: Reported on 03/06/2022), Disp: 120 mL, Rfl: 5   ondansetron (ZOFRAN ODT) 4 MG disintegrating tablet, Take 1 tablet (4 mg total) by mouth every 8 (eight) hours as needed for nausea or vomiting. (Patient not taking: Reported on 08/13/2019), Disp: 20 tablet, Rfl: 0   pravastatin (PRAVACHOL) 10 MG tablet, Take 1 tablet (10 mg total) by mouth every evening. Take 20 mg by mouth daily (Patient not taking: Reported on 08/10/2021), Disp: 180 tablet, Rfl:  3   pravastatin (PRAVACHOL) 20 MG tablet, Take one tablet each evening., Disp: 30 tablet, Rfl: 11   promethazine (PHENERGAN) 25 MG tablet, Take 1 tablet (25 mg total) by mouth every 6 (six) hours as needed for nausea or vomiting. (Patient not taking: Reported on 08/13/2019), Disp: 20 tablet, Rfl: 0  Allergies as of 06/07/2022   (No Known Allergies)    1. Work and Family: Karandeep is still spending a lot of time on the web.  2. Activities: Few physical activities. He is a history and politics buff. He frequently participates in online forum discussions about historical and political topics. He was thinking at one time about becoming a history teacher or a welder, but he has no plant to take more college classes or to find work. He says he is too afraid of getting covid to return to college or to look for work. His father retired in October 2022.  3. Smoking, alcohol, or drugs: None 4. Primary Care Provider: None  5. Psychiatry: None  6. Psychologist: None   REVIEW OF SYSTEMS: There are no other significant problems involving Emiliano's other body systems.   Objective:  Vital Signs:  There were no vitals taken for this visit.    Ht Readings from Last 3 Encounters:  09/19/20 5' 7.52" (1.715 m)  08/13/19 5' 7.13" (1.705 m)  01/27/19 5' 7.17" (1.706 m)   Wt Readings from Last 3 Encounters:  03/06/22 159 lb 12.8 oz (72.5 kg)  12/05/21 159 lb (72.1 kg)  08/10/21 157 lb 6.4 oz (71.4 kg)   PHYSICAL EXAM:  Constitutional: Shelden looked healthy and happy today. His weight has remained the same. He was alert and bright. His affect was very upbeat and positive. His insight was good today. He was very chatty about the world and national political situations.   Head: His tendency to baldness is progressing. Face: The face appears normal.  Eyes: There is no obvious arcus or proptosis. Moisture appears normal. Mouth: The oropharynx and tongue appear normal. Oral moisture is normal. Neck: The  neck looks normal. No carotid bruits are noted. The thyroid gland is top-normal in size at about 20 grams. The  consistency of the thyroid gland is normal today. The thyroid gland is not tender to palpation.  Lungs: The lungs are clear to auscultation. Air movement is good. Heart: Heart rate remains borderline tachycardic. Heart rhythm is regular. Heart sounds S1 and S2 are normal. I did not appreciate any pathologic cardiac murmurs. Abdomen: The abdomen is more enlarged. Bowel sounds are normal. There is no obvious hepatomegaly, splenomegaly, or other mass effect.  Arms: Muscle size and bulk are normal for age. Hands: There is no obvious tremor. Phalangeal and metacarpophalangeal joints are normal. Palmar muscles are normal. Palmar skin is normal. Palmar moisture is also normal. Legs: Muscles appear normal for age. No edema is present. Feet: Feet are normally formed. Dorsalis pedal pulses are 1+ on the left and 1+ on the right.   Neurologic: Strength is normal for age in both the upper and lower extremities. Muscle tone is normal. Sensation to touch is normal in both legs, but slightly decreased in both heels.      LAB DATA:   No results found for this or any previous visit (from the past 504 hour(s)).  Labs 03/06/22 (non-fasting): HbA1c 8.1%, CBG 161; TSH 1.39, free T4 , free T3 ; CMP normal, except glucose 197; cholesterol 161, triglycerides 106, HDL 55, LDL 86  Labs 12/05/21: HbA1c 7.9%, CBG 390  Labs 08/10/21: HbA1c 8.4%, CBG 153; cholesterol 163, triglycerides 83, HDL 51, LDL 139  Labs 05/10/21: HbA1c 8.9%, CBG 192  Labs 05/05/21: TSH 2.8, free T4 1.4, free T3 3.5; CMP normal; Labs 01/30/21: HbA1c 8.3%, CBG 198  Labs 09/19/20: HbA1c 8.3%, CBG 146  Labs 06/15/20: HbA1c 8.3%, CBG 209; TSH 2.42, free T4 1.2, free T3 2.8; CMP normal, except glucose 224, with sodium artifactually low at 134 and chloride artifactually low at 97; cholesterol 189, triglycerides 90, HDL 52, LDL 110; LH 2.4, FSH 6.7,  testosterone 612; urinary microalbumin/creatinine ratio 11  Labs 03/16/20: HbA1c 8.9%, CBG 142  Labs 11/12/19: HbA1c 7.5%, CBG 222; HbA1c is lower, but at the cost of too many low BGs.  Labs 08/13/19: HbA1c 8.7%, CBG 264  Labs 07/15/19: TSH 1.90, free T4 1.2, free T3 2.8; CMP normal, except glucose 180; CBC normal; cholesterol 177, triglycerides 65, HDL 56, LDL 106; microalbumin/creatinine ratio 3; LH 3.5, FSH 7.7, testosterone 495 (ref 250-827), free testosterone 68.1 (ref 46-224)  Labs 04/28/19: HbA1c 7.7%, CBG 394; U/A positive glucose and moderate ketones  Labs 01/27/19: HbA1c 7.4%, CBG 231  Labs 10/27/18: HbA1c 8.0%, CBG 250  Labs 07/21/18: HbA1c 8.2%, CBG 166  Labs 07/15/18: TSH 1.54, free T4 1.2, free T3 3.5; CMP normal; cholesterol 176, triglycerides 74, HDL 53, LDL 107; urinary microalbumin/creatinine ratio 6  Labs 04/21/18: HbA1c 8.2%, CBG 295  Labs 01/20/18: HbA1c 8.4%, CBG 206  Labs 10/30/17: HbA1c 9.0%, CBG 147  Labs 08/13/17: CBG 354  Labs 06/04/17: CBG 173; BMP normal  Labs 05/29/17: HbA1c 8.2%; TSH 1.84, free T4 1.2, free T3 3.4, CMP normal except for glucose 310 and potassium 5.8; LH 3.7, FSH 5.9, testosterone 634, free testosterone 76 (ref 46-224), estradiol 31; microalbumin/creatinine ratio 4; cholesterol 187, triglycerides 63, HDL 59, LDL 116  Labs 04/03/17: CBG 182  Labs 01/31/17: HbA1c 7.6%, CBG 341  Labs 10/30/16: HbA1c 7.5%  Labs 07/25/16: HbA1c 7.8%  Labs 04/25/16: HbA1c 7.7%  Labs 04/09/16: LH 3.4, FSH 6.0, testosterone 504, free testosterone 85.5 (ref 47-244); cholesterol 176, triglycerides 92, HDL 80, LDL 88; CMP norma except for glucose 224; TSH 1.75, free T4 1.2,  free T3 3.2; microalbumin/creatinine ratio 4  Labs 01/24/16: HbA1c 8.6%  Labs 10/20/15: HbA1c 8.1%  Labs 09/15/15: BMP normal except for glucose of 143 and calcium 8.3.  Labs 07/20/15: HbA1c 8.2%  Labs 03/25/15: HbA1c 7.8%  Labs 12/02/14: HbA1c 8.5%;  LH 4.0, FSH 8.2, testosterone 621.97; CMP  normal; TSH 2.01, free T4 1.21, free T3 3.6; cholesterol 155, triglycerides 46, HDL  55, LDL 91; microalbumin/creatinine ratio 6.6  Labs 03/30/14: TSH 1.935, free T4 1.12, free T3 3.2; CMP: normal except for glucose 203; testosterone 624, FSH 6.3, LH 2.4; urine microalbumin/creatinine ratio 6.4; cholesterol 163, triglycerides 55, HDL 64, LDL 80  Labs 08/23/13: TSH 1.139; CMP normal except for a glucose of 145; CBC: WBC 5.4, Hgb 12.7, Hct 34.7%   Assessment and Plan:   ASSESSMENT:  1-2. Type 1 diabetes mellitus/hypoglycemia:  A. Tait's HbA1c had decreased from 8.4% to 7.9% in January 2023 Unfortunately, his average BG has increased since then. On some days he does better at checking BGs and giving himself insulin boluses, but on other days he does not do well.  B. He had one documented low BG of 54, but can't remember the circumstances.    C. He continues to be inconsistently inconsistent in taking care of his T1DM.  3. Autonomic neuropathy and inappropriate sinus tachycardia: These problems are better.  4. Peripheral neuropathy: This problem is evident again today, but is very mild.    5. Hypothyroid: He was euthyroid in December 2015, in May 2017, in June 2018, in August 2019, in August 2020, in July 2021, and in June 2022 on his current dose of Synthroid. TFTS drawn today are pending.  6. Hypogonadotropic hypogonadism: His testosterone level in May 2017 was acceptable, but was lower than in December 2015. His testosterone level in June 2018 was good, but his free testosterone was relatively low for his age.  His testosterone in August 2020 was within normal limits, but still relatively low for his age. His testosterone level in July 2021 was higher.   7. Hypertension: His BP is good today. He needs to take his lisinopril and exercise regularly.  8. Goiter/Hashimoto's disease: His goiter is now normal in size. The pattern of waxing and waning of thyroid gland size is c/w evolving thyroiditis.  The thyroiditis is clinically quiescent 9. Depression: This problem is much better today.  He says that he is taking the escitalopram reliably. He does not exhibit any suicidal thoughts or emotions.  Since he does not have a psychiatrist or a PCP to prescribe escitalopram, I have agreed to prescribe it for him until he can find a new psychiatrist or PCP. Unfortunately, he is not interested in finding another PCP or psychiatrist, so I have continued to prescribe the escitalopram for him.   10. Hypercholesterolemia: His cholesterol levels were elevated in August 2019, in August 2020, in July 2021, and in September 2022. I increased his  pravastatin dose then to 20 mg/day. Exercise and more consistent BG control would help.  11. Noncompliance: He is doing somewhat better at taking care of his DM. He needs to be more consistent with BG testing and bolusing. I continue to hope that as Shadd ages and matures, he will do a better job of taking care of his T1DM. At this time, however, he remains consistently inconsistent.      PLAN:  1. Diagnostic: I reviewed his BG printout.  2. Therapeutic: Continue Synthroid, 25 mcg/day, escitalopram, 10 mg/day, lisinopril 2.5 mg/day. Continue pravastatin dose  of 20 mg/day. Give both correction boluses and food boluses. We continued his current pump settings:  Continue Basal rates:   MN: 0.75 5 AM: 0.75 8 AM:  0.70 Noon: 0.950 6 PM: 1.65 9 PM: 1.35  Continue BG targets:  MN: 150 8 AM: 110 10 PM: 150  Continue ISF: 65  Continue ICRs: MN: 15 8 AM: 11 4:30 PM: 15 3. Patient education: Maddux is a very intelligent young man who knows intellectually what he should do and how to do it. Unfortunately, he still lacks the commitment to be as consistent with his T1DM care as he needs to be. If he will start using the Guardian 3 sensor and take advantage of all the benefits of the Medtronic 670G pump and sensor combination, then his BG control can be much better. I  suggested that he go to the [employer] website. 4. Follow-up: 3 months with me  Level of Service: This visit lasted in excess of 60 minutes. More than 50% of the visit was devoted to counseling.  [name] Sers, MD, CDE Adult and Pediatric Endocrinology 06/06/2022 8:53 PM

## 2022-06-07 ENCOUNTER — Ambulatory Visit (INDEPENDENT_AMBULATORY_CARE_PROVIDER_SITE_OTHER): Payer: BC Managed Care – PPO | Admitting: "Endocrinology

## 2022-06-07 ENCOUNTER — Encounter (INDEPENDENT_AMBULATORY_CARE_PROVIDER_SITE_OTHER): Payer: Self-pay | Admitting: "Endocrinology

## 2022-06-07 VITALS — BP 112/70 | HR 68 | Ht 67.09 in | Wt 162.2 lb

## 2022-06-07 DIAGNOSIS — E1065 Type 1 diabetes mellitus with hyperglycemia: Secondary | ICD-10-CM | POA: Diagnosis not present

## 2022-06-07 DIAGNOSIS — E063 Autoimmune thyroiditis: Secondary | ICD-10-CM | POA: Diagnosis not present

## 2022-06-07 DIAGNOSIS — E049 Nontoxic goiter, unspecified: Secondary | ICD-10-CM

## 2022-06-07 DIAGNOSIS — E10649 Type 1 diabetes mellitus with hypoglycemia without coma: Secondary | ICD-10-CM

## 2022-06-07 DIAGNOSIS — E1042 Type 1 diabetes mellitus with diabetic polyneuropathy: Secondary | ICD-10-CM

## 2022-06-07 DIAGNOSIS — E1043 Type 1 diabetes mellitus with diabetic autonomic (poly)neuropathy: Secondary | ICD-10-CM

## 2022-06-07 DIAGNOSIS — E7800 Pure hypercholesterolemia, unspecified: Secondary | ICD-10-CM

## 2022-06-07 LAB — POCT GLUCOSE (DEVICE FOR HOME USE): POC Glucose: 236 mg/dl — AB (ref 70–99)

## 2022-07-05 ENCOUNTER — Encounter (INDEPENDENT_AMBULATORY_CARE_PROVIDER_SITE_OTHER): Payer: Self-pay

## 2022-10-03 ENCOUNTER — Telehealth: Payer: Self-pay | Admitting: Internal Medicine

## 2022-10-03 ENCOUNTER — Other Ambulatory Visit (INDEPENDENT_AMBULATORY_CARE_PROVIDER_SITE_OTHER): Payer: Self-pay

## 2022-10-03 DIAGNOSIS — E1042 Type 1 diabetes mellitus with diabetic polyneuropathy: Secondary | ICD-10-CM

## 2022-10-03 NOTE — Telephone Encounter (Signed)
Patient was referred to office by Dr Loren Racer office.  Mother came in and made the appointment for patient. She advises that she and patients father have been his caregivers since he was 63 months old. She is requesting an opportunity to speak with provider prior to visit in person or via MyChart to go over patients "extensive medical history"  Mother's call back number is [phone]. I did attempt to advise that this was an atypical request for this office and that it is not necessarily a request that can be accommodated.

## 2022-10-04 NOTE — Telephone Encounter (Signed)
Spoke with mom and advised that we would not be able to discuss any medical information until patient has actually establish with practice.

## 2023-01-14 ENCOUNTER — Inpatient Hospital Stay (HOSPITAL_BASED_OUTPATIENT_CLINIC_OR_DEPARTMENT_OTHER)
Admission: EM | Admit: 2023-01-14 | Discharge: 2023-01-16 | DRG: 919 | Disposition: A | Discharge status: Home / Self Care | Payer: BC Managed Care – PPO | Attending: Family Medicine | Admitting: Family Medicine

## 2023-01-14 ENCOUNTER — Encounter: Payer: Self-pay | Admitting: Internal Medicine

## 2023-01-14 ENCOUNTER — Other Ambulatory Visit: Payer: Self-pay

## 2023-01-14 ENCOUNTER — Telehealth: Payer: Self-pay | Admitting: Internal Medicine

## 2023-01-14 ENCOUNTER — Ambulatory Visit: Payer: BC Managed Care – PPO | Admitting: Internal Medicine

## 2023-01-14 ENCOUNTER — Encounter (HOSPITAL_BASED_OUTPATIENT_CLINIC_OR_DEPARTMENT_OTHER): Payer: Self-pay

## 2023-01-14 VITALS — BP 114/72 | HR 70 | Ht 67.09 in | Wt 153.2 lb

## 2023-01-14 DIAGNOSIS — E063 Autoimmune thyroiditis: Secondary | ICD-10-CM

## 2023-01-14 DIAGNOSIS — E1043 Type 1 diabetes mellitus with diabetic autonomic (poly)neuropathy: Secondary | ICD-10-CM | POA: Diagnosis present

## 2023-01-14 DIAGNOSIS — N179 Acute kidney failure, unspecified: Secondary | ICD-10-CM | POA: Diagnosis present

## 2023-01-14 DIAGNOSIS — Z79890 Hormone replacement therapy (postmenopausal): Secondary | ICD-10-CM | POA: Diagnosis not present

## 2023-01-14 DIAGNOSIS — Z794 Long term (current) use of insulin: Secondary | ICD-10-CM | POA: Diagnosis not present

## 2023-01-14 DIAGNOSIS — Z833 Family history of diabetes mellitus: Secondary | ICD-10-CM

## 2023-01-14 DIAGNOSIS — R6510 Systemic inflammatory response syndrome (SIRS) of non-infectious origin without acute organ dysfunction: Secondary | ICD-10-CM | POA: Diagnosis not present

## 2023-01-14 DIAGNOSIS — E1010 Type 1 diabetes mellitus with ketoacidosis without coma: Secondary | ICD-10-CM | POA: Diagnosis present

## 2023-01-14 DIAGNOSIS — E875 Hyperkalemia: Secondary | ICD-10-CM | POA: Diagnosis present

## 2023-01-14 DIAGNOSIS — Z91148 Patient's other noncompliance with medication regimen for other reason: Secondary | ICD-10-CM

## 2023-01-14 DIAGNOSIS — E861 Hypovolemia: Secondary | ICD-10-CM | POA: Diagnosis present

## 2023-01-14 DIAGNOSIS — R109 Unspecified abdominal pain: Secondary | ICD-10-CM | POA: Diagnosis present

## 2023-01-14 DIAGNOSIS — Z9641 Presence of insulin pump (external) (internal): Secondary | ICD-10-CM | POA: Diagnosis not present

## 2023-01-14 DIAGNOSIS — E1065 Type 1 diabetes mellitus with hyperglycemia: Secondary | ICD-10-CM

## 2023-01-14 DIAGNOSIS — T85694A Other mechanical complication of insulin pump, initial encounter: Secondary | ICD-10-CM | POA: Diagnosis not present

## 2023-01-14 DIAGNOSIS — E785 Hyperlipidemia, unspecified: Secondary | ICD-10-CM | POA: Diagnosis not present

## 2023-01-14 DIAGNOSIS — T383X6A Underdosing of insulin and oral hypoglycemic [antidiabetic] drugs, initial encounter: Secondary | ICD-10-CM | POA: Diagnosis present

## 2023-01-14 DIAGNOSIS — E876 Hypokalemia: Secondary | ICD-10-CM | POA: Diagnosis not present

## 2023-01-14 DIAGNOSIS — D72829 Elevated white blood cell count, unspecified: Secondary | ICD-10-CM | POA: Diagnosis present

## 2023-01-14 DIAGNOSIS — K920 Hematemesis: Secondary | ICD-10-CM | POA: Diagnosis present

## 2023-01-14 DIAGNOSIS — Z79899 Other long term (current) drug therapy: Secondary | ICD-10-CM | POA: Diagnosis not present

## 2023-01-14 DIAGNOSIS — E1042 Type 1 diabetes mellitus with diabetic polyneuropathy: Secondary | ICD-10-CM | POA: Diagnosis present

## 2023-01-14 DIAGNOSIS — I10 Essential (primary) hypertension: Secondary | ICD-10-CM | POA: Diagnosis not present

## 2023-01-14 DIAGNOSIS — Z8249 Family history of ischemic heart disease and other diseases of the circulatory system: Secondary | ICD-10-CM

## 2023-01-14 DIAGNOSIS — R000 Tachycardia, unspecified: Secondary | ICD-10-CM | POA: Diagnosis not present

## 2023-01-14 LAB — CBC
HCT: 49.6 % (ref 39.0–52.0)
Hemoglobin: 17.1 g/dL — ABNORMAL HIGH (ref 13.0–17.0)
MCH: 31 pg (ref 26.0–34.0)
MCHC: 34.5 g/dL (ref 30.0–36.0)
MCV: 89.9 fL (ref 80.0–100.0)
Platelets: 389 10*3/uL (ref 150–400)
RBC: 5.52 MIL/uL (ref 4.22–5.81)
RDW: 13.2 % (ref 11.5–15.5)
WBC: 25.8 10*3/uL — ABNORMAL HIGH (ref 4.0–10.5)
nRBC: 0 % (ref 0.0–0.2)

## 2023-01-14 LAB — BASIC METABOLIC PANEL
Anion gap: 32 — ABNORMAL HIGH (ref 5–15)
Anion gap: 29 — ABNORMAL HIGH (ref 5–15)
Anion gap: 21 — ABNORMAL HIGH (ref 5–15)
BUN: 15 mg/dL (ref 6–23)
BUN: 19 mg/dL (ref 6–20)
BUN: 18 mg/dL (ref 6–20)
BUN: 16 mg/dL (ref 6–20)
CO2: 13 mEq/L — ABNORMAL LOW (ref 19–32)
CO2: 7 mmol/L — ABNORMAL LOW (ref 22–32)
CO2: 9 mmol/L — ABNORMAL LOW (ref 22–32)
CO2: 10 mmol/L — ABNORMAL LOW (ref 22–32)
Calcium: 10.1 mg/dL (ref 8.4–10.5)
Calcium: 10 mg/dL (ref 8.9–10.3)
Calcium: 9.5 mg/dL (ref 8.9–10.3)
Calcium: 8.9 mg/dL (ref 8.9–10.3)
Chloride: 94 mEq/L — ABNORMAL LOW (ref 96–112)
Chloride: 94 mmol/L — ABNORMAL LOW (ref 98–111)
Chloride: 95 mmol/L — ABNORMAL LOW (ref 98–111)
Chloride: 104 mmol/L (ref 98–111)
Creatinine, Ser: 1.16 mg/dL (ref 0.40–1.50)
Creatinine, Ser: 1.45 mg/dL — ABNORMAL HIGH (ref 0.61–1.24)
Creatinine, Ser: 1.4 mg/dL — ABNORMAL HIGH (ref 0.61–1.24)
Creatinine, Ser: 1.07 mg/dL (ref 0.61–1.24)
GFR, Estimated: >60 mL/min (ref >60)
GFR, Estimated: >60 mL/min (ref >60)
GFR, Estimated: >60 mL/min (ref >60)
GFR: 84.11 mL/min (ref >60.00)
Glucose, Bld: 329 mg/dL — ABNORMAL HIGH (ref 70–99)
Glucose, Bld: 466 mg/dL — ABNORMAL HIGH (ref 70–99)
Glucose, Bld: 481 mg/dL — ABNORMAL HIGH (ref 70–99)
Glucose, Bld: 204 mg/dL — ABNORMAL HIGH (ref 70–99)
Potassium: 4.7 mEq/L (ref 3.5–5.1)
Potassium: 4.8 mmol/L (ref 3.5–5.1)
Potassium: 5.4 mmol/L — ABNORMAL HIGH (ref 3.5–5.1)
Potassium: 4.4 mmol/L (ref 3.5–5.1)
Sodium: 131 mEq/L — ABNORMAL LOW (ref 135–145)
Sodium: 133 mmol/L — ABNORMAL LOW (ref 135–145)
Sodium: 133 mmol/L — ABNORMAL LOW (ref 135–145)
Sodium: 135 mmol/L (ref 135–145)

## 2023-01-14 LAB — I-STAT VENOUS BLOOD GAS, ED
Acid-base deficit: 20 mmol/L — ABNORMAL HIGH (ref 0.0–2.0)
Bicarbonate: 9.1 mmol/L — ABNORMAL LOW (ref 20.0–28.0)
Calcium, Ion: 1.24 mmol/L (ref 1.15–1.40)
HCT: 47 % (ref 39.0–52.0)
Hemoglobin: 16 g/dL (ref 13.0–17.0)
O2 Saturation: 47 %
Potassium: 5.4 mmol/L — ABNORMAL HIGH (ref 3.5–5.1)
Sodium: 131 mmol/L — ABNORMAL LOW (ref 135–145)
TCO2: 10 mmol/L — ABNORMAL LOW (ref 22–32)
pCO2, Ven: 30.4 mmHg — ABNORMAL LOW (ref 44–60)
pH, Ven: 7.084 — CL (ref 7.25–7.43)
pO2, Ven: 35 mmHg (ref 32–45)

## 2023-01-14 LAB — URINALYSIS, ROUTINE W REFLEX MICROSCOPIC
Bacteria, UA: NONE SEEN
Bilirubin Urine: NEGATIVE
Glucose, UA: >1000 mg/dL — AB
Ketones, ur: >80 mg/dL — AB
Leukocytes,Ua: NEGATIVE
Nitrite: NEGATIVE
Specific Gravity, Urine: 1.025 (ref 1.005–1.030)
pH: 5 (ref 5.0–8.0)

## 2023-01-14 LAB — CBC WITH DIFFERENTIAL/PLATELET
Abs Immature Granulocytes: 0.29 10*3/uL — ABNORMAL HIGH (ref 0.00–0.07)
Basophils Absolute: 0.1 10*3/uL (ref 0.0–0.1)
Basophils Relative: 0 %
Eosinophils Absolute: 0.1 10*3/uL (ref 0.0–0.5)
Eosinophils Relative: 0 %
HCT: 44.9 % (ref 39.0–52.0)
Hemoglobin: 15.7 g/dL (ref 13.0–17.0)
Immature Granulocytes: 1 %
Lymphocytes Relative: 4 %
Lymphs Abs: 1.2 10*3/uL (ref 0.7–4.0)
MCH: 31.2 pg (ref 26.0–34.0)
MCHC: 35 g/dL (ref 30.0–36.0)
MCV: 89.3 fL (ref 80.0–100.0)
Monocytes Absolute: 1.4 10*3/uL — ABNORMAL HIGH (ref 0.1–1.0)
Monocytes Relative: 5 %
Neutro Abs: 23.3 10*3/uL — ABNORMAL HIGH (ref 1.7–7.7)
Neutrophils Relative %: 90 %
Platelets: 364 10*3/uL (ref 150–400)
RBC: 5.03 MIL/uL (ref 4.22–5.81)
RDW: 13.2 % (ref 11.5–15.5)
WBC: 26.1 10*3/uL — ABNORMAL HIGH (ref 4.0–10.5)
nRBC: 0 % (ref 0.0–0.2)

## 2023-01-14 LAB — LIPID PANEL
Cholesterol: 182 mg/dL (ref 0–200)
HDL: 56.4 mg/dL (ref >39.00)
LDL Cholesterol: 102 mg/dL — ABNORMAL HIGH (ref 0–99)
NonHDL: 125.74
Total CHOL/HDL Ratio: 3
Triglycerides: 117 mg/dL (ref 0.0–149.0)
VLDL: 23.4 mg/dL (ref 0.0–40.0)

## 2023-01-14 LAB — GLUCOSE, CAPILLARY: Glucose-Capillary: 173 mg/dL — ABNORMAL HIGH (ref 70–99)

## 2023-01-14 LAB — CBG MONITORING, ED
Glucose-Capillary: 447 mg/dL — ABNORMAL HIGH (ref 70–99)
Glucose-Capillary: 522 mg/dL (ref 70–99)
Glucose-Capillary: 424 mg/dL — ABNORMAL HIGH (ref 70–99)
Glucose-Capillary: 341 mg/dL — ABNORMAL HIGH (ref 70–99)
Glucose-Capillary: 239 mg/dL — ABNORMAL HIGH (ref 70–99)
Glucose-Capillary: 184 mg/dL — ABNORMAL HIGH (ref 70–99)

## 2023-01-14 LAB — POCT GLYCOSYLATED HEMOGLOBIN (HGB A1C): Hemoglobin A1C: 8.7 % — AB (ref 4.0–5.6)

## 2023-01-14 LAB — BETA-HYDROXYBUTYRIC ACID: Beta-Hydroxybutyric Acid: >8 mmol/L — ABNORMAL HIGH (ref 0.05–0.27)

## 2023-01-14 LAB — TSH: TSH: 1.41 u[IU]/mL (ref 0.35–5.50)

## 2023-01-14 LAB — VITAMIN D 25 HYDROXY (VIT D DEFICIENCY, FRACTURES): VITD: 8.22 ng/mL — ABNORMAL LOW (ref 30.00–100.00)

## 2023-01-14 MED ORDER — DEXTROSE 50 % IV SOLN: 0.0000 mL | INTRAVENOUS | Status: DC | PRN

## 2023-01-14 MED ORDER — INSULIN REGULAR(HUMAN) IN NACL 100-0.9 UT/100ML-% IV SOLN
INTRAVENOUS | Status: DC
  Administered 2023-01-14: 8 [IU]/h via INTRAVENOUS
  Filled 2023-01-14 (×2): qty 100

## 2023-01-14 MED ORDER — METOCLOPRAMIDE HCL 5 MG/ML IJ SOLN
10.0000 mg | Freq: Once | INTRAMUSCULAR | Status: AC
  Administered 2023-01-14: 10 mg via INTRAVENOUS
  Filled 2023-01-14: qty 2

## 2023-01-14 MED ORDER — LACTATED RINGERS IV BOLUS
20.0000 mL/kg | Freq: Once | INTRAVENOUS | Status: AC
  Administered 2023-01-14: 1390 mL via INTRAVENOUS

## 2023-01-14 MED ORDER — DEXTROSE IN LACTATED RINGERS 5 % IV SOLN: INTRAVENOUS | Status: DC

## 2023-01-14 MED ORDER — SODIUM CHLORIDE 0.45 % IV SOLN: INTRAVENOUS | Status: DC

## 2023-01-14 MED ORDER — PANTOPRAZOLE SODIUM 40 MG IV SOLR
40.0000 mg | Freq: Once | INTRAVENOUS | Status: AC
  Administered 2023-01-14: 40 mg via INTRAVENOUS
  Filled 2023-01-14: qty 10

## 2023-01-14 MED ORDER — LACTATED RINGERS IV SOLN: INTRAVENOUS | Status: DC

## 2023-01-14 MED ORDER — LACTATED RINGERS IV BOLUS
2600.0000 mL | Freq: Once | INTRAVENOUS | Status: AC
  Administered 2023-01-14: 1000 mL via INTRAVENOUS

## 2023-01-14 MED ORDER — ONDANSETRON HCL 4 MG/2ML IJ SOLN: 4.0000 mg | Freq: Four times a day (QID) | INTRAMUSCULAR | Status: DC | PRN

## 2023-01-14 MED ORDER — PANTOPRAZOLE SODIUM 40 MG IV SOLR
40.0000 mg | Freq: Two times a day (BID) | INTRAVENOUS | Status: DC
  Administered 2023-01-15 – 2023-01-16 (×3): 40 mg via INTRAVENOUS
  Filled 2023-01-14 (×3): qty 10

## 2023-01-14 MED ORDER — LEVOTHYROXINE SODIUM 25 MCG PO TABS
25.0000 ug | ORAL_TABLET | Freq: Every day | ORAL | Status: DC
  Filled 2023-01-14: qty 1

## 2023-01-14 MED ORDER — ONDANSETRON HCL 4 MG/2ML IJ SOLN
4.0000 mg | Freq: Once | INTRAMUSCULAR | Status: AC
  Administered 2023-01-14: 4 mg via INTRAVENOUS
  Filled 2023-01-14: qty 2

## 2023-01-14 MED ORDER — POTASSIUM CHLORIDE 10 MEQ/100ML IV SOLN
10.0000 meq | INTRAVENOUS | Status: DC
  Administered 2023-01-14: 10 meq via INTRAVENOUS
  Filled 2023-01-14 (×2): qty 100

## 2023-01-14 MED ORDER — SODIUM BICARBONATE 8.4 % IV SOLN
INTRAVENOUS | Status: AC
  Administered 2023-01-14: 75 meq
  Filled 2023-01-14: qty 100

## 2023-01-14 MED ORDER — SODIUM CHLORIDE 0.45 % IV SOLN
INTRAVENOUS | Status: DC
  Filled 2023-01-14: qty 75

## 2023-01-14 MED ORDER — SODIUM CHLORIDE 0.9 % IV SOLN
12.5000 mg | Freq: Four times a day (QID) | INTRAVENOUS | Status: DC | PRN
  Administered 2023-01-15: 12.5 mg via INTRAVENOUS
  Filled 2023-01-14 (×2): qty 0.5

## 2023-01-14 NOTE — ED Notes (Signed)
Called [facility] to transport patient to [facility] rm# [id number]

## 2023-01-14 NOTE — ED Triage Notes (Signed)
Patient here POV from Home.  Endorses Emesis that began at 0800 this AM. Martin Majestic to PCP today for Assessment.   T1DM. Called by PCP to inform Patient that he was in DKA. Insulin Pump in Use.   NAD noted during Triage. A&Ox4. GCS 15. Ambulatory.

## 2023-01-14 NOTE — Telephone Encounter (Signed)
Called and spoke with pts mom who advised she had left a message stating she was taking pt to the ED because they suspected DKA. Pt's mom was advised of lab results and requested refill on all of pt medications including insulin.

## 2023-01-14 NOTE — Progress Notes (Unsigned)
Name: Scott Moran  MRN/ DOB: [id number], 09-13-1991   Age/ Sex: 32 y.o., male    PCP: Enid Skeens., MD   Reason for Endocrinology Evaluation: Type 1 Diabetes Mellitus     Date of Initial Endocrinology Visit: 01/14/2023     PATIENT IDENTIFIER: Mr. Scott Moran is a 32 y.o. male with a past medical history of DM. The patient presented for initial endocrinology clinic visit on 01/14/2023 for consultative assistance with his diabetes management.    HPI: Scott Moran is accompanied by  his parents Elta Guadeloupe and Eustaquio Maize    Diagnosed with DM 2/19967 at 15 months of age  Prior Medications tried/Intolerance: Medtronic pump since 5th grade  Currently checking blood sugars 1-4 x / day,no meter Hypoglycemia episodes :not recent             Hemoglobin A1c has ranged from 7.5% in 2020, peaking at 8.9% in 2022.  In terms of diet, the patient grazes through the day.   Today he has been having a lot of nausea that was started over the weekend that is thought to be emotional due to a stressful event over the weekend. He did have a vomiting episode earlier today and during his office visit Denies changes in bowel movements  This patient with type 1 diabetes is treated with Medtronic (insulin pump). During the visit the pump basal and bolus doses were reviewed including carb/insulin rations and supplemental doses. The clinical list was updated. The glucose meter download was reviewed in detail to determine if the current pump settings are providing the best glycemic control without excessive hypoglycemia.  Pump and meter download:    Pump   Medtronic  Settings   Insulin type   Novlog    Basal rate       0000 0.750 u/h    0500 0.750   0800 0.700   1200 0.975   1800 1.65   2100 1.35          I:C ratio       0000 15   0800 11   1630 15          Sensitivity       0000  65       Goal       0000  110            Type & Model of Pump: Medtronic Insulin Type: Currently using  NovoLog.  Body mass index is 23.93 kg/m.  PUMP STATISTICS: Average BG: 219 +/- 72 Average Daily Carbs (g): 124 +/- 98 Average Total Daily Insulin: 37.8 +/- 7.9 Average Daily Basal: 25.7 (68 %) Average Daily Bolus: 12.1 (32 %)    THYROID HISTORY: Patient was diagnosed with immune mediated thyroiditis in 2008, by his previous endocrinologist.  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,neuropathy  Denies: n/a Last eye exam: Completed >1 yr   Macrovascular complications:   Denies: CAD, PVD, CVA   PAST HISTORY: Past Medical History:  Past Medical History:  Diagnosis Date   DM type 1 with diabetic peripheral neuropathy ([place])    Goiter    Hypertension    Hypoglycemia associated with diabetes ([place])    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 He has never used smokeless tobacco. He reports that he does not drink alcohol and does not use drugs. Family History:  Family History  Problem Relation Age of Onset   Diabetes Maternal Uncle    Hypertension Maternal Grandmother    Thyroid disease Neg Hx    Obesity Neg Hx    Kidney disease Neg Hx    Cancer Neg Hx    Anemia Neg Hx      HOME MEDICATIONS: Allergies as of 01/14/2023   No Known Allergies      Medication List        Accurate as of January 14, 2023  3:59 PM. If you have any questions, ask your nurse or  doctor.          STOP taking these medications    glucose blood test strip Commonly known as: English as a second language teacher Stopped by: Dorita Sciara, MD   insulin lispro 100 UNIT/ML injection Commonly known as: HumaLOG Stopped by: Dorita Sciara, MD   ondansetron 4 MG disintegrating tablet Commonly known as: Zofran ODT Stopped by: Dorita Sciara, MD   promethazine 25 MG tablet Commonly known as: PHENERGAN Stopped by: Dorita Sciara, MD       TAKE these medications    acetone (urine) test strip Check ketones per protocol   escitalopram 10 MG tablet Commonly known as: LEXAPRO TAKE 1 TABLET BY MOUTH ONCE DAILY   glucagon 1 MG injection Inject 1 mg intramuscularly in thigh 1 time for severe hypoglycemia if unresponsive, unconscious, unable to swallow or has a seizure.   insulin aspart 100 UNIT/ML injection Commonly known as: NovoLOG INJECT 300 UNITS INTO PUMP EVERY 48-72 HOURS AND PER PROTOCOL FOR HYPERGLYCEMIA   NovoLOG 100 UNIT/ML injection Generic drug: insulin aspart INJECT 300 UNITS INTO PUMP EVERY 48-72 HOURS AND PER PROTOCOL FOR HYPERGLYCEMIA   levothyroxine 25 MCG tablet Commonly known as: SYNTHROID TAKE 1 TABLET BY MOUTH DAILY BEFORE BREAKFAST   lisinopril 2.5 MG tablet Commonly known as: ZESTRIL TAKE 1 TABLET BY MOUTH ONCE DAILY   pravastatin 10 MG tablet Commonly known as: PRAVACHOL Take 1 tablet (10 mg total) by mouth every evening. Take 20 mg by mouth daily   pravastatin 20 MG tablet Commonly known as: PRAVACHOL Take one tablet each evening.         ALLERGIES: No Known Allergies   REVIEW OF SYSTEMS: A comprehensive ROS was conducted with the patient and is negative except as per HPI    OBJECTIVE:   VITAL SIGNS: BP 114/72 (BP Location: Right Arm, Patient Position: Sitting, Cuff Size: Small)   Pulse 70   Ht 5' 7.09" (1.704 m)   Wt 153 lb 3.2 oz (69.5 kg)   SpO2 95%   BMI 23.93 kg/m    PHYSICAL EXAM:   General: Pt appears well and is in NAD  Neck: General: Supple without adenopathy or carotid bruits. Thyroid: Thyroid size normal.  No goiter or nodules appreciated.   Lungs: Clear with good BS bilat   Heart: Tachycardic   Abdomen:  soft, nontender  Extremities:  Lower extremities - No  pretibial edema.   Neuro: MS is good with appropriate affect, pt is alert and Ox3    DM foot exam: 01/14/2023  The skin of the feet is intact without sores or ulcerations. The pedal pulses are 2+ on right and 2+ on left. The sensation is intact to a screening 5.07, 10 gram monofilament bilaterally   DATA REVIEWED:  Lab Results  Component Value Date   HGBA1C 8.7 (A) 01/14/2023   HGBA1C 8.1 (H) 03/06/2022   HGBA1C 7.9 (A) 12/05/2021    Latest Reference Range & Units 01/14/23 08:57  Sodium 135 - 145 mEq/L 131 (L)  Potassium 3.5 - 5.1 mEq/L 4.7  Chloride 96 - 112 mEq/L 94 (L)  CO2 19 - 32 mEq/L 13 (L)  Glucose 70 - 99 mg/dL 329 (H)  BUN 6 - 23 mg/dL 15  Creatinine 0.40 - 1.50 mg/dL 1.16  Calcium 8.4 - 10.5 mg/dL 10.1  GFR >60.00 mL/min 84.11  Total CHOL/HDL Ratio  3  Cholesterol 0 - 200 mg/dL 182  HDL Cholesterol >39.00 mg/dL 56.40  LDL (calc) 0 - 99 mg/dL 102 (H)  NonHDL  125.74  Triglycerides 0.0 - 149.0 mg/dL 117.0  VLDL 0.0 - 40.0 mg/dL 23.4    Latest Reference Range & Units 01/14/23 08:57  TSH 0.35 - 5.50 uIU/mL 1.41      ASSESSMENT / PLAN / RECOMMENDATIONS:   1) Type 1 Diabetes Mellitus, poorly controlled, With retinopathic complications - Most recent A1c of 8.7 %. Goal A1c < 7.0 %.     -This was a limited initial visit as the patient was vomiting prior and during his office visit -There family, the patient is an introvert and initially attributed his vomiting due to an emotional event that occurred over the weekend -In reviewing his Medtronic pump, the patient has been noted with glycemic excursions ranging from 74 to >300 mg/dL, this is due to insulin to carb ratio mismatch,  and lack of bolusing at times -I have encouraged the patient to consider CGM technology, he had tried the guardian in the past but was not keen on it -He was given a sample of Dexcom G7 and a receiver to try this, as I think it will be very beneficial for him -I have encouraged the patient to bolus before each meal and each snack -His BMP today showed elevated anion gap with low bicarb, patient/mother  was contacted by our staff and was directed to the ED, which it appears that they were on their way there due to persistent symptoms    MEDICATIONS: Continue NovoLog per pump  EDUCATION / INSTRUCTIONS: BG monitoring instructions: Patient is instructed to check his blood sugars 3 times a day, before each meal. Call [facility] if: BG persistently < 70  I reviewed the Rule of 15 for the treatment of hypoglycemia in detail with the patient. Literature supplied.   2) Diabetic complications:  Eye: Does  have known diabetic retinopathy.  Neuro/ Feet: Does not have known diabetic peripheral neuropathy. Renal: Patient does not have known baseline CKD. He is not on an ACEI/ARB at present.  3) Hypothyroidism:  -He was diagnosed with immune related hypothyroid through  his previous endocrinologist -In reviewing his chart thyroperoxidase antibodies were undetectable in 2015 -TSH normal -Continue levothyroxine as below - Pt educated extensively on the correct way to take levothyroxine (first thing in the morning with water, 30 minutes before eating or taking other medications). - Pt encouraged to double dose the following day if she  were to miss a dose given long half-life of levothyroxine.   Medication Continue levothyroxine 25 mcg daily    F/U in 4 months    Signed electronically by: Mack Guise, MD  [facility]  [facility] [address] [place], [place] [address] Phone: [phone] FAX: [phone]   CC: Enid Skeens., MD [address] [address] Phone: [phone]  Fax: [phone]    Return to Endocrinology clinic as below: Future Appointments  Date Time Provider [place]  05/01/2023 11:50 AM Camren Henthorn, Melanie Crazier, MD [id number] None

## 2023-01-14 NOTE — Plan of Care (Signed)
Plan of Care Note for accepted transfer   Patient: Scott Moran MRN: [id number]   DOA: 01/14/2023  Facility requesting transfer: [facility] Requesting Provider: Margarita Mail, PA-C Reason for transfer: DKA Facility course:  Mr. Cotten is a 32 yo male with PMH Type 1 DM, Hashimoto's thyroiditis, HTN, HLD who presented to [facility] after malfunction with insulin pump. He follows outpatient with endocrinology (see last note).  Patient workup consistent with DKA. Notable labs include VBG: 7.084/30.  Initial BMP: Na 131, K 4.7, Cl 94, CO2 13 (downtrending to 9), Glucose 329, BUN 15, Cr 1.16, AG 32.  WBC 25.8, Hgb 17.1, PLTC 389 UA noted with ketones, proteinuria, no signs of infection.  Patient started on IVF and insulin drip. Recommended changing to bicarb drip given severe acidosis.   Of note, patient did also have dark emesis and may need further workup/monitoring to rule out UGIB/MW-tear.    Plan of care: The patient is accepted for admission to Progressive unit, at [facility].  Author: Dwyane Dee, MD 01/14/2023  Check [email or web address] for on-call coverage.  Nursing staff, Please call [place] number on Amion as soon as patient's arrival, so appropriate admitting provider can evaluate the pt.

## 2023-01-14 NOTE — H&P (Signed)
History and Physical    Leeland Barb Zogg [id number] DOB: [birth year] DOA: 01/14/2023  PCP: Enid Skeens., MD   Patient coming from: Home   Chief Complaint: Abdominal pain, N/V, insulin pump not working, DKA on outpatient labs  HPI: Taris Villalva Luddy is a 32 y.o. male with medical history significant for type 1 diabetes mellitus, autoimmune thyroiditis, hypertension, and hyperlipidemia, presenting to the emergency department with abdominal pain, nausea, vomiting, insulin pump not working, and DKA on outpatient blood work.  Patient had been experiencing nausea for couple days, went on to develop abdominal pain with nausea and recurrent vomiting, and noted that his insulin pump was not working.  He changed the insulin pump site and believes that that fix the problem.  He was seen by his endocrinologist for this this morning, had blood work performed, and was notified that he was in DKA and should present to the ED.  His abdominal discomfort is primarily in the lower abdomen and improved with treatment in the ED.  His vomitus has been dark brownish.  No melena or hematochezia, no chest pain, and no fevers.  Aside from insulin, he has not been taking his medications lately.  [facility] ED Course: Upon arrival to the ED, patient is found to be afebrile and saturating well on room air with tachypnea, tachycardia, and systolic blood pressure of 95 and greater.  Labs are most notable for glucose 481, potassium 5.4, bicarbonate 9, anion gap 29, creatinine 1.40, and WBC 26,100.  Ketonuria is noted.  ED PA discussed case with critical care who recommended at least 4 L of IV fluids and hospitalist admission.  The patient was given 4 L of LR, Reglan, Zofran, IV Protonix, and was started on insulin and bicarbonate infusions.  He was transferred to [facility] for admission.  Review of Systems:  All other systems reviewed and apart from HPI, are negative.  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 He has never used smokeless tobacco. He reports that he does not drink alcohol and does not use drugs.  No Known Allergies  Family History  Problem Relation Age of Onset   Diabetes Maternal Uncle    Hypertension Maternal Grandmother    Thyroid disease Neg Hx    Obesity Neg Hx    Kidney disease Neg Hx    Cancer Neg Hx    Anemia Neg Hx      Prior to Admission medications   Medication Sig Start Date End Date Taking? Authorizing Provider  acetone, urine, test strip Check ketones per protocol Patient not taking: Reported on 01/14/2023 09/14/15   Jonathon Resides T, FNP  escitalopram (LEXAPRO) 10 MG tablet TAKE 1 TABLET BY MOUTH ONCE DAILY 12/05/21   Sherrlyn Hock, MD  glucagon 1 MG injection Inject 1 mg intramuscularly in thigh 1 time for severe hypoglycemia if unresponsive, unconscious, unable to swallow or has a seizure. 09/19/20 11/15/23  Sherrlyn Hock, MD  insulin aspart (NOVOLOG) 100 UNIT/ML injection INJECT 300 UNITS INTO PUMP EVERY 48-72 HOURS AND PER PROTOCOL FOR HYPERGLYCEMIA 12/21/20   Sherrlyn Hock, MD  levothyroxine (SYNTHROID) 25 MCG tablet TAKE 1 TABLET BY MOUTH DAILY BEFORE BREAKFAST 10/30/21   Sherrlyn Hock, MD  lisinopril (ZESTRIL) 2.5 MG tablet TAKE 1 TABLET BY MOUTH ONCE DAILY 02/08/22   Sherrlyn Hock, MD  NOVOLOG 100 UNIT/ML injection INJECT 300 UNITS INTO PUMP EVERY 48-72 HOURS AND PER PROTOCOL FOR HYPERGLYCEMIA 12/09/21   Sherrlyn Hock, MD   pravastatin (PRAVACHOL) 10 MG tablet Take 1 tablet (10 mg total) by mouth every evening. Take 20 mg by mouth daily 08/04/21   Sherrlyn Hock, MD  pravastatin (PRAVACHOL) 20 MG tablet Take one tablet each evening. 12/06/21 12/06/22  Sherrlyn Hock, MD    Physical Exam: Vitals:   01/14/23 2045 01/14/23 2100 01/14/23 2130 01/14/23 2234  BP: 107/63 105/60 101/65 95/66  Pulse: (!) 125 (!) 128 (!) 126 (!) 118  Resp: (!) 22 (!) 21 (!) 22 18  Temp: 97.8 F (36.6 C)  98 F (36.7 C) 98.4 F (36.9 C)  TempSrc: Oral   Oral  SpO2: 98% 100% 99% 99%  Weight:    69.9 kg  Height:        Constitutional: NAD, no diaphoresis   Eyes: PERTLA, lids and conjunctivae normal ENMT: Mucous membranes are moist. Posterior pharynx clear of any exudate or lesions.   Neck: supple, no masses  Respiratory: no wheezing, no crackles. No accessory muscle use.  Cardiovascular: Rate ~120 and regular. No JVD. Abdomen: No distension, no tenderness, soft. Bowel sounds active.  Musculoskeletal: no clubbing / cyanosis. No joint deformity upper and lower extremities.   Skin: no significant rashes, lesions, ulcers. Warm, dry, well-perfused. Neurologic: CN 2-12 grossly intact. Moving all extremities. Alert and oriented.  Psychiatric: Calm. Cooperative.    Labs and Imaging on Admission: I have personally reviewed following labs and imaging studies  CBC: Recent Labs  Lab 01/14/23 1717 01/14/23 1741 01/14/23 1830  WBC 25.8* 26.1*  --   NEUTROABS  --  23.3*  --   HGB 17.1* 15.7 16.0  HCT 49.6 44.9 47.0  MCV 89.9 89.3  --   PLT 389 364  --    Basic Metabolic Panel: Recent Labs  Lab 01/14/23 0857 01/14/23 1717 01/14/23 1741 01/14/23 1830 01/14/23 2141  NA 131* 133* 133* 131* 135  K 4.7 4.8 5.4* 5.4* 4.4  CL 94* 94* 95*  --  104  CO2 13* 7* 9*  --  10*  GLUCOSE 329* 466* 481*  --  204*  BUN 15 19 18  $ --  16  CREATININE 1.16 1.45* 1.40*  --  1.07  CALCIUM 10.1 10.0 9.5  --  8.9   GFR: Estimated  Creatinine Clearance: 93.8 mL/min (by C-G formula based on SCr of 1.07 mg/dL). Liver Function Tests: No results for input(s): "AST", "ALT", "ALKPHOS", "BILITOT", "PROT", "ALBUMIN" in the last 168 hours. No results for input(s): "LIPASE", "AMYLASE" in the last 168 hours. No results for input(s): "AMMONIA" in the last 168 hours. Coagulation Profile: No results for input(s): "INR", "PROTIME" in the last 168 hours. Cardiac Enzymes: No results for input(s): "CKTOTAL", "CKMB", "CKMBINDEX", "TROPONINI" in the last 168 hours. BNP (last 3 results) No results for input(s): "PROBNP" in the last 8760 hours. HbA1C: Recent Labs    01/14/23 0844  HGBA1C 8.7*   CBG: Recent Labs  Lab 01/14/23  1917 01/14/23 1947 01/14/23 2052 01/14/23 2146 01/14/23 2248  GLUCAP 424* 341* 239* 184* 173*   Lipid Profile: Recent Labs    01/14/23 0857  CHOL 182  HDL 56.40  LDLCALC 102*  TRIG 117.0  CHOLHDL 3   Thyroid Function Tests: Recent Labs    01/14/23 0857  TSH 1.41   Anemia Panel: No results for input(s): "VITAMINB12", "FOLATE", "FERRITIN", "TIBC", "IRON", "RETICCTPCT" in the last 72 hours. Urine analysis:    Component Value Date/Time   COLORURINE YELLOW 01/14/2023 [place] 01/14/2023 1717   LABSPEC 1.025 01/14/2023 1717   PHURINE 5.0 01/14/2023 1717   GLUCOSEU >1,000 (A) 01/14/2023 1717   HGBUR SMALL (A) 01/14/2023 1717   BILIRUBINUR NEGATIVE 01/14/2023 1717   KETONESUR >80 (A) 01/14/2023 1717   PROTEINUR TRACE (A) 01/14/2023 1717   UROBILINOGEN 0.2 09/15/2015 1549   NITRITE NEGATIVE 01/14/2023 1717   LEUKOCYTESUR NEGATIVE 01/14/2023 1717   Sepsis Labs: @LABRCNTIP$ (procalcitonin:4,lacticidven:4) )No results found for this or any previous visit (from the past 240 hour(s)).   Radiological Exams on Admission: No results found.   Assessment/Plan   1. DKA  - Likely secondary to insulin pump not working  - A1c was 8.7% today  - Fluid-resuscitated with 4 liters LR  and started on insulin infusion in ED  - Continue IVF hydration and IV insulin infusion with frequent CBGs and serial chem panels until acidosis resolved    2. AKI; hyperkalemia  - SCr was 1.40 in ED, up from baseline of ~0.9  - Continue IVF hydration, hold lisinopril, repeat chem panel   3. SIRS  - No fever or apparent infectious process  - Leukocytosis likely reactive, tachycardia likely d/t hypovolemia, and tachypnea d/t metabolic acidosis, will monitor   4. Hypertension  - SBP 90s on arrival to [facility], will treat as-needed only for now   5. Hypothyroidism  - Resume Synthroid    6. Dark emesis  - No chest, upper back, or epigastric pain  - Given IV Protonix in ED  - ?esophagitis, gastritis, or Mallory-Weiss syndrome; less likely ulcer disease or [place]  - Continue IV PPI, trend H&H     DVT prophylaxis: SCDs  Code Status: Full  Level of Care: Level of care: Progressive Family Communication: Mother and father at bedside   Disposition Plan:  Patient is from: home  Anticipated d/c is to: Home  Anticipated d/c date is: 01/16/23  Patient currently: Pending resolution of DKA, tolerance of adequate oral intake, stable H&H  Consults called: None  Admission status: Inpatient     Vianne Bulls, MD [facility]  01/14/2023, 11:09 PM

## 2023-01-14 NOTE — ED Provider Notes (Signed)
[facility] Provider Note   CSN: [id number] Arrival date & time: 01/14/23  1708     History  Chief Complaint  Patient presents with   Emesis    Scott Moran is a 32 y.o. male with a past medical history of type 1 diabetes mellitus since age of 58 months who presents emergency department with DKA.  His mother supplements a history.  Patient complains of severe abdominal pain nausea and vomiting.  He uses an insulin pump.  His mother states that he has been feeling nauseous for the past couple of days.  They realized that his insulin pump was not working today and tried to switch it but had an appointment with their endocrinologist.  He was already nauseous and having some vomiting at that time which has been progressively worsening since since this morning.  Has been unable to hold down any fluids.  He states this feels like previous DKA episodes.  He was called by his endocrinologist and told to come to the emergency room due to findings of acute DKA on lab work this morning.  He has had no recent illnesses.   Emesis      Home Medications Prior to Admission medications   Medication Sig Start Date End Date Taking? Authorizing Provider  acetone, urine, test strip Check ketones per protocol Patient not taking: Reported on 01/14/2023 09/14/15   Jonathon Resides T, FNP  escitalopram (LEXAPRO) 10 MG tablet TAKE 1 TABLET BY MOUTH ONCE DAILY 12/05/21   Sherrlyn Hock, MD  glucagon 1 MG injection Inject 1 mg intramuscularly in thigh 1 time for severe hypoglycemia if unresponsive, unconscious, unable to swallow or has a seizure. 09/19/20 11/15/23  Sherrlyn Hock, MD  insulin aspart (NOVOLOG) 100 UNIT/ML injection INJECT 300 UNITS INTO PUMP EVERY 48-72 HOURS AND PER PROTOCOL FOR HYPERGLYCEMIA 12/21/20   Sherrlyn Hock, MD  levothyroxine (SYNTHROID) 25 MCG tablet TAKE 1 TABLET BY MOUTH DAILY BEFORE BREAKFAST 10/30/21   Sherrlyn Hock, MD   lisinopril (ZESTRIL) 2.5 MG tablet TAKE 1 TABLET BY MOUTH ONCE DAILY 02/08/22   Sherrlyn Hock, MD  NOVOLOG 100 UNIT/ML injection INJECT 300 UNITS INTO PUMP EVERY 48-72 HOURS AND PER PROTOCOL FOR HYPERGLYCEMIA 12/09/21   Sherrlyn Hock, MD  pravastatin (PRAVACHOL) 10 MG tablet Take 1 tablet (10 mg total) by mouth every evening. Take 20 mg by mouth daily 08/04/21   Sherrlyn Hock, MD  pravastatin (PRAVACHOL) 20 MG tablet Take one tablet each evening. 12/06/21 12/06/22  Sherrlyn Hock, MD      Allergies    Patient has no known allergies.    Review of Systems   Review of Systems  Gastrointestinal:  Positive for vomiting.    Physical Exam Updated Vital Signs BP 107/64   Pulse (!) 130   Temp 97.6 F (36.4 C) (Oral)   Resp (!) 22   Ht 5' 7.09" (1.704 m)   Wt 69.5 kg   SpO2 98%   BMI 23.93 kg/m  Physical Exam Vitals and nursing note reviewed.  Constitutional:      General: He is not in acute distress.    Appearance: He is well-developed. He is ill-appearing. He is not diaphoretic.  HENT:     Head: Normocephalic and atraumatic.     Mouth/Throat:     Mouth: Mucous membranes are dry.     Comments: Sticky, dry oral mucosa Eyes:     General: No scleral icterus.  Extraocular Movements: Extraocular movements intact.     Conjunctiva/sclera: Conjunctivae normal.     Pupils: Pupils are equal, round, and reactive to light.  Cardiovascular:     Rate and Rhythm: Regular rhythm. Tachycardia present.     Heart sounds: Normal heart sounds.  Pulmonary:     Effort: Pulmonary effort is normal. No respiratory distress.     Breath sounds: Normal breath sounds.  Abdominal:     Palpations: Abdomen is soft.     Tenderness: There is abdominal tenderness. There is guarding.     Comments: Patient actively vomiting dark-colored emesis  Musculoskeletal:     Cervical back: Normal range of motion and neck supple.  Skin:    General: Skin is warm and dry.  Neurological:     Mental Status:  He is alert and oriented to person, place, and time.  Psychiatric:        Behavior: Behavior normal.     ED Results / Procedures / Treatments   Labs (all labs ordered are listed, but only abnormal results are displayed) Labs Reviewed  BASIC METABOLIC PANEL - Abnormal; Notable for the following components:      Result Value   Sodium 133 (*)    Chloride 94 (*)    CO2 7 (*)    Glucose, Bld 466 (*)    Creatinine, Ser 1.45 (*)    Anion gap 32 (*)    All other components within normal limits  CBC - Abnormal; Notable for the following components:   WBC 25.8 (*)    Hemoglobin 17.1 (*)    All other components within normal limits  URINALYSIS, ROUTINE W REFLEX MICROSCOPIC - Abnormal; Notable for the following components:   Glucose, UA >1,000 (*)    Hgb urine dipstick SMALL (*)    Ketones, ur >80 (*)    Protein, ur TRACE (*)    All other components within normal limits  BASIC METABOLIC PANEL - Abnormal; Notable for the following components:   Sodium 133 (*)    Potassium 5.4 (*)    Chloride 95 (*)    CO2 9 (*)    Glucose, Bld 481 (*)    Creatinine, Ser 1.40 (*)    Anion gap 29 (*)    All other components within normal limits  CBC WITH DIFFERENTIAL/PLATELET - Abnormal; Notable for the following components:   WBC 26.1 (*)    Neutro Abs 23.3 (*)    Monocytes Absolute 1.4 (*)    Abs Immature Granulocytes 0.29 (*)    All other components within normal limits  CBG MONITORING, ED - Abnormal; Notable for the following components:   Glucose-Capillary 447 (*)    All other components within normal limits  CBG MONITORING, ED - Abnormal; Notable for the following components:   Glucose-Capillary 522 (*)    All other components within normal limits  CBG MONITORING, ED - Abnormal; Notable for the following components:   Glucose-Capillary 424 (*)    All other components within normal limits  I-STAT VENOUS BLOOD GAS, ED - Abnormal; Notable for the following components:   pH, Ven 7.084 (*)     pCO2, Ven 30.4 (*)    Bicarbonate 9.1 (*)    TCO2 10 (*)    Acid-base deficit 20.0 (*)    Sodium 131 (*)    Potassium 5.4 (*)    All other components within normal limits  CBG MONITORING, ED - Abnormal; Notable for the following components:   Glucose-Capillary 341 (*)  All other components within normal limits  BASIC METABOLIC PANEL  BASIC METABOLIC PANEL  BASIC METABOLIC PANEL  BETA-HYDROXYBUTYRIC ACID  BETA-HYDROXYBUTYRIC ACID    EKG None  Radiology No results found.  Procedures .Critical Care  Performed by: Margarita Mail, PA-C Authorized by: Margarita Mail, PA-C   Critical care provider statement:    Critical care time (minutes):  60   Critical care time was exclusive of:  Separately billable procedures and treating other patients   Critical care was necessary to treat or prevent imminent or life-threatening deterioration of the following conditions:  Metabolic crisis   Critical care was time spent personally by me on the following activities:  Development of treatment plan with patient or surrogate, discussions with consultants, evaluation of patient's response to treatment, examination of patient, ordering and review of laboratory studies, ordering and review of radiographic studies, ordering and performing treatments and interventions, pulse oximetry, re-evaluation of patient's condition and review of old charts     Medications Ordered in ED Medications  insulin regular, human (MYXREDLIN) 100 units/ 100 mL infusion (5.5 Units/hr Intravenous Rate/Dose Change 01/14/23 1947)  lactated ringers infusion ( Intravenous New Bag/Given 01/14/23 1909)  dextrose 5 % in lactated ringers infusion (has no administration in time range)  dextrose 50 % solution 0-50 mL (has no administration in time range)  potassium chloride 10 mEq in 100 mL IVPB (10 mEq Intravenous New Bag/Given 01/14/23 1812)  sodium bicarbonate 75 mEq in sodium chloride 0.45 % 1,075 mL infusion (has no  administration in time range)  lactated ringers bolus 1,390 mL (1,390 mLs Intravenous New Bag/Given 01/14/23 1810)  ondansetron (ZOFRAN) injection 4 mg (4 mg Intravenous Given 01/14/23 1804)  pantoprazole (PROTONIX) injection 40 mg (40 mg Intravenous Given 01/14/23 1925)  metoCLOPramide (REGLAN) injection 10 mg (10 mg Intravenous Given 01/14/23 1923)  lactated ringers bolus 2,600 mL (1,000 mLs Intravenous New Bag/Given 01/14/23 1941)    ED Course/ Medical Decision Making/ A&P Clinical Course as of 01/14/23 1951  Mon Jan 14, 2023  1919 pH, Ven(!!): 7.084 [AH]  1919 WBC(!): 26.1 [AH]  1919 Glucose-Capillary(!): 424 [AH]  1919 Glucose, UA(!): >1,000 [AH]  1919 Potassium(!): 5.4 [AH]  1919 Creatinine(!): 1.40 [AH]  1919 Anion gap(!): 29 [AH]    Clinical Course User Index [AH] Margarita Mail, PA-C                             Medical Decision Making Patient here with vomiting. The emergent differential diagnosis for vomiting includes, but is not limited to ACS/MI, DKA, Ischemic bowel, Meningitis, Sepsis, Acute gastric dilation, Adrenal insufficiency, Appendicitis,  Bowel obstruction/ileus, Carbon monoxide poisoning, Cholecystitis, Electrolyte abnormalities, Elevated ICP, Gastric outlet obstruction, Pancreatitis, Ruptured viscus, Biliary colic, Cannabinoid hyperemesis syndrome, Gastritis, Gastroenteritis, Gastroparesis,  Narcotic withdrawal, Peptic ulcer disease, and UTI   Labs reviewed show DKA with metabolic acidosis, elevated white blood cell count,Acute kidney injury with anion gap of 32.   Patient placed on IV insulin drip, antiemetics given.  He does have some dark-colored emesis that looks like hemoptysis.  This is likely secondary to dry heaving and Mallory-Weiss tear.  He has no history of GI bleeding.  Case discussed with Dr.Girguis [facility] hospitalist who is willing to accept the patient but asked that we consult with critical care.  Case discussed with Dr. Tacy Learn of critical care  who recommends at least 4 L of fluid which I have placed an order for.  He feels that this  patient may go to hospitalist service.  I was able to contact Dr. Sabino Gasser who also recommends a bicarb drip.  Patient will be admitted.  Amount and/or Complexity of Data Reviewed Labs: ordered. Decision-making details documented in ED Course. Radiology: independent interpretation performed.  Risk Prescription drug management.           Final Clinical Impression(s) / ED Diagnoses Final diagnoses:  Diabetic ketoacidosis without coma associated with type 1 diabetes mellitus ([place])  AKI (acute kidney injury) [facility])    Rx / DC Orders ED Discharge Orders     None         Margarita Mail, PA-C 01/14/23 1951    Charlesetta Shanks, MD 01/16/23 1523

## 2023-01-14 NOTE — Telephone Encounter (Signed)
Can you please let the patient/parents know that he is in DKA (diabetic ketoacidosis) and needs to present to the emergency room for proper management of DKA    Vitamin D is very low, a prescription will be sent to the pharmacy for vitamin D ergocalciferol 50,000 once weekly  Thyroid is normal, continue current dose of levothyroxine  Thanks

## 2023-01-14 NOTE — Patient Instructions (Signed)

## 2023-01-15 ENCOUNTER — Inpatient Hospital Stay: Payer: Self-pay

## 2023-01-15 ENCOUNTER — Encounter: Payer: Self-pay | Admitting: Internal Medicine

## 2023-01-15 DIAGNOSIS — I10 Essential (primary) hypertension: Secondary | ICD-10-CM | POA: Diagnosis not present

## 2023-01-15 DIAGNOSIS — E1010 Type 1 diabetes mellitus with ketoacidosis without coma: Secondary | ICD-10-CM | POA: Diagnosis not present

## 2023-01-15 DIAGNOSIS — K920 Hematemesis: Secondary | ICD-10-CM | POA: Diagnosis not present

## 2023-01-15 DIAGNOSIS — N179 Acute kidney failure, unspecified: Secondary | ICD-10-CM | POA: Diagnosis not present

## 2023-01-15 LAB — GLUCOSE, CAPILLARY
Glucose-Capillary: 151 mg/dL — ABNORMAL HIGH (ref 70–99)
Glucose-Capillary: 154 mg/dL — ABNORMAL HIGH (ref 70–99)
Glucose-Capillary: 154 mg/dL — ABNORMAL HIGH (ref 70–99)
Glucose-Capillary: 156 mg/dL — ABNORMAL HIGH (ref 70–99)
Glucose-Capillary: 164 mg/dL — ABNORMAL HIGH (ref 70–99)
Glucose-Capillary: 164 mg/dL — ABNORMAL HIGH (ref 70–99)
Glucose-Capillary: 165 mg/dL — ABNORMAL HIGH (ref 70–99)
Glucose-Capillary: 172 mg/dL — ABNORMAL HIGH (ref 70–99)
Glucose-Capillary: 173 mg/dL — ABNORMAL HIGH (ref 70–99)
Glucose-Capillary: 173 mg/dL — ABNORMAL HIGH (ref 70–99)
Glucose-Capillary: 174 mg/dL — ABNORMAL HIGH (ref 70–99)
Glucose-Capillary: 176 mg/dL — ABNORMAL HIGH (ref 70–99)
Glucose-Capillary: 178 mg/dL — ABNORMAL HIGH (ref 70–99)
Glucose-Capillary: 179 mg/dL — ABNORMAL HIGH (ref 70–99)
Glucose-Capillary: 179 mg/dL — ABNORMAL HIGH (ref 70–99)
Glucose-Capillary: 182 mg/dL — ABNORMAL HIGH (ref 70–99)
Glucose-Capillary: 183 mg/dL — ABNORMAL HIGH (ref 70–99)
Glucose-Capillary: 184 mg/dL — ABNORMAL HIGH (ref 70–99)
Glucose-Capillary: 187 mg/dL — ABNORMAL HIGH (ref 70–99)
Glucose-Capillary: 189 mg/dL — ABNORMAL HIGH (ref 70–99)
Glucose-Capillary: 189 mg/dL — ABNORMAL HIGH (ref 70–99)
Glucose-Capillary: 197 mg/dL — ABNORMAL HIGH (ref 70–99)
Glucose-Capillary: 215 mg/dL — ABNORMAL HIGH (ref 70–99)

## 2023-01-15 LAB — HIV ANTIBODY (ROUTINE TESTING W REFLEX): HIV Screen 4th Generation wRfx: NONREACTIVE

## 2023-01-15 LAB — BASIC METABOLIC PANEL
Anion gap: 15 (ref 5–15)
Anion gap: 14 (ref 5–15)
Anion gap: 14 (ref 5–15)
Anion gap: 12 (ref 5–15)
Anion gap: 11 (ref 5–15)
BUN: 10 mg/dL (ref 6–20)
BUN: 10 mg/dL (ref 6–20)
BUN: 7 mg/dL (ref 6–20)
BUN: 5 mg/dL — ABNORMAL LOW (ref 6–20)
BUN: <5 mg/dL — ABNORMAL LOW (ref 6–20)
CO2: 13 mmol/L — ABNORMAL LOW (ref 22–32)
CO2: 15 mmol/L — ABNORMAL LOW (ref 22–32)
CO2: 14 mmol/L — ABNORMAL LOW (ref 22–32)
CO2: 19 mmol/L — ABNORMAL LOW (ref 22–32)
CO2: 20 mmol/L — ABNORMAL LOW (ref 22–32)
Calcium: 8.6 mg/dL — ABNORMAL LOW (ref 8.9–10.3)
Calcium: 8.6 mg/dL — ABNORMAL LOW (ref 8.9–10.3)
Calcium: 8.7 mg/dL — ABNORMAL LOW (ref 8.9–10.3)
Calcium: 8.6 mg/dL — ABNORMAL LOW (ref 8.9–10.3)
Calcium: 8.6 mg/dL — ABNORMAL LOW (ref 8.9–10.3)
Chloride: 106 mmol/L (ref 98–111)
Chloride: 105 mmol/L (ref 98–111)
Chloride: 106 mmol/L (ref 98–111)
Chloride: 105 mmol/L (ref 98–111)
Chloride: 105 mmol/L (ref 98–111)
Creatinine, Ser: 1.25 mg/dL — ABNORMAL HIGH (ref 0.61–1.24)
Creatinine, Ser: 1.13 mg/dL (ref 0.61–1.24)
Creatinine, Ser: 1.12 mg/dL (ref 0.61–1.24)
Creatinine, Ser: 0.97 mg/dL (ref 0.61–1.24)
Creatinine, Ser: 0.86 mg/dL (ref 0.61–1.24)
GFR, Estimated: >60 mL/min (ref >60)
GFR, Estimated: >60 mL/min (ref >60)
GFR, Estimated: >60 mL/min (ref >60)
GFR, Estimated: >60 mL/min (ref >60)
GFR, Estimated: >60 mL/min (ref >60)
Glucose, Bld: 206 mg/dL — ABNORMAL HIGH (ref 70–99)
Glucose, Bld: 204 mg/dL — ABNORMAL HIGH (ref 70–99)
Glucose, Bld: 196 mg/dL — ABNORMAL HIGH (ref 70–99)
Glucose, Bld: 172 mg/dL — ABNORMAL HIGH (ref 70–99)
Glucose, Bld: 161 mg/dL — ABNORMAL HIGH (ref 70–99)
Potassium: 4.2 mmol/L (ref 3.5–5.1)
Potassium: 4.9 mmol/L (ref 3.5–5.1)
Potassium: 4 mmol/L (ref 3.5–5.1)
Potassium: 3.5 mmol/L (ref 3.5–5.1)
Potassium: 3.1 mmol/L — ABNORMAL LOW (ref 3.5–5.1)
Sodium: 134 mmol/L — ABNORMAL LOW (ref 135–145)
Sodium: 134 mmol/L — ABNORMAL LOW (ref 135–145)
Sodium: 134 mmol/L — ABNORMAL LOW (ref 135–145)
Sodium: 136 mmol/L (ref 135–145)
Sodium: 136 mmol/L (ref 135–145)

## 2023-01-15 LAB — CBC
HCT: 38 % — ABNORMAL LOW (ref 39.0–52.0)
Hemoglobin: 13.9 g/dL (ref 13.0–17.0)
MCH: 31.9 pg (ref 26.0–34.0)
MCHC: 36.6 g/dL — ABNORMAL HIGH (ref 30.0–36.0)
MCV: 87.2 fL (ref 80.0–100.0)
Platelets: 244 10*3/uL (ref 150–400)
RBC: 4.36 MIL/uL (ref 4.22–5.81)
RDW: 13.1 % (ref 11.5–15.5)
WBC: 17.1 10*3/uL — ABNORMAL HIGH (ref 4.0–10.5)
nRBC: 0 % (ref 0.0–0.2)

## 2023-01-15 MED ORDER — INSULIN ASPART 100 UNIT/ML IJ SOLN: INTRAMUSCULAR | 4 refills | Status: DC

## 2023-01-15 MED ORDER — POTASSIUM CHLORIDE CRYS ER 20 MEQ PO TBCR
40.0000 meq | EXTENDED_RELEASE_TABLET | Freq: Once | ORAL | Status: AC
  Administered 2023-01-15: 40 meq via ORAL
  Filled 2023-01-15: qty 2

## 2023-01-15 MED ORDER — LEVOTHYROXINE SODIUM 25 MCG PO TABS: 25.0000 ug | ORAL_TABLET | Freq: Every day | ORAL | 3 refills | Status: DC

## 2023-01-15 MED ORDER — SODIUM CHLORIDE 0.9 % IV BOLUS
500.0000 mL | Freq: Once | INTRAVENOUS | Status: AC
  Administered 2023-01-15: 500 mL via INTRAVENOUS

## 2023-01-15 MED ORDER — POTASSIUM CHLORIDE 10 MEQ/100ML IV SOLN
10.0000 meq | Freq: Once | INTRAVENOUS | Status: AC
  Administered 2023-01-15: 10 meq via INTRAVENOUS
  Filled 2023-01-15: qty 100

## 2023-01-15 MED ORDER — INSULIN DETEMIR 100 UNIT/ML ~~LOC~~ SOLN
15.0000 [IU] | Freq: Every day | SUBCUTANEOUS | Status: DC
  Administered 2023-01-15: 15 [IU] via SUBCUTANEOUS
  Filled 2023-01-15 (×2): qty 0.15

## 2023-01-15 MED ORDER — INSULIN ASPART 100 UNIT/ML IJ SOLN: 0.0000 [IU] | Freq: Every day | INTRAMUSCULAR | Status: DC

## 2023-01-15 MED ORDER — INSULIN ASPART 100 UNIT/ML IJ SOLN
0.0000 [IU] | Freq: Three times a day (TID) | INTRAMUSCULAR | Status: DC
  Administered 2023-01-16: 5 [IU] via SUBCUTANEOUS

## 2023-01-15 MED ORDER — ACETAMINOPHEN 325 MG PO TABS
650.0000 mg | ORAL_TABLET | Freq: Four times a day (QID) | ORAL | Status: DC | PRN
  Administered 2023-01-15 – 2023-01-16 (×3): 650 mg via ORAL
  Filled 2023-01-15 (×3): qty 2

## 2023-01-15 MED ORDER — INSULIN DETEMIR 100 UNIT/ML ~~LOC~~ SOLN: 0.3000 [IU]/kg | SUBCUTANEOUS | Status: DC

## 2023-01-15 NOTE — Progress Notes (Signed)
[facility]  PROGRESS NOTE  Scott Moran [id number] DOB: 1991-09-09 DOA: 01/14/2023 PCP: Enid Skeens., MD   Brief HPI:   32 year old male with medical history of type 1 diabetes mellitus, autoimmune thyroiditis, hypertension, hyperlipidemia came to ED with abdominal pain, nausea, vomiting, insulin pump not working.  Found to have DKA on outpatient blood work. He has been having abdominal pain with nausea and vomiting.  He changed insulin pump site and believe that would fix the problem as the pump was not working.  He was seen by endocrinologist, had blood work performed which showed that he had DKA.  Advised to go to ED.  In the ED he was found to have bicarb of 9, anion gap 29, blood glucose 481.  WBC 26,000.  Patient was started on IV fluids, IV insulin and DKA protocol initiated.    Subjective   Patient seen and examined, denies nausea or vomiting.   Assessment/Plan:    Diabetic ketoacidosis -Secondary to insulin pump not working -Hemoglobin A1c was 8.7% -Patient given IV fluids 4 L and started on IV insulin infusion in the ED -Last lab work showed CO2 up to 19 and anion gap of 12 -Will continue with IV insulin till acidosis resolves  Acute kidney injury -Baseline creatinine 0.9 -Presented with creatinine of 1.40 -Continue IV fluids, lisinopril on hold -Creatinine has improved to 0.97, back to baseline  SIRS -No fever or apparent infectious process -Leukocytosis is likely reactive -WBC is down to 17,000  Hypertension -Blood pressure is stable  Hypothyroidism -Continue Synthroid  Dark emesis -No epigastric pain -?  Esophagitis/Mallory-Weiss tear -Continue IV PPI every 12 hours -Hemoglobin stable at 13.9 -Follow CBC in a.m.    Medications     pantoprazole (PROTONIX) IV  40 mg Intravenous Q12H     Data Reviewed:   CBG:  Recent Labs  Lab 01/15/23 1254 01/15/23 1346 01/15/23 1449 01/15/23 1537 01/15/23 1637  GLUCAP 182* 154*  164* 173* 154*    SpO2: 98 %    Vitals:   01/15/23 0812 01/15/23 1200 01/15/23 1547 01/15/23 1638  BP: 92/70 111/76 113/76 116/75  Pulse: (!) 108 (!) 110 94 96  Resp: 19 18 (!) 23 20  Temp: 99.5 F (37.5 C) 99.1 F (37.3 C)  99.1 F (37.3 C)  TempSrc: Oral Oral  Oral  SpO2: 97% 99% 99% 98%  Weight:      Height:          Data Reviewed:  Basic Metabolic Panel: Recent Labs  Lab 01/14/23 2141 01/15/23 0216 01/15/23 0611 01/15/23 0851 01/15/23 1502  NA 135 134* 134* 134* 136  K 4.4 4.2 4.9 4.0 3.5  CL 104 106 105 106 105  CO2 10* 13* 15* 14* 19*  GLUCOSE 204* 206* 204* 196* 172*  BUN 16 10 10 7 $ 5*  CREATININE 1.07 1.25* 1.13 1.12 0.97  CALCIUM 8.9 8.6* 8.6* 8.7* 8.6*    CBC: Recent Labs  Lab 01/14/23 1717 01/14/23 1741 01/14/23 1830 01/15/23 0218  WBC 25.8* 26.1*  --  17.1*  NEUTROABS  --  23.3*  --   --   HGB 17.1* 15.7 16.0 13.9  HCT 49.6 44.9 47.0 38.0*  MCV 89.9 89.3  --  87.2  PLT 389 364  --  244    LFT No results for input(s): "AST", "ALT", "ALKPHOS", "BILITOT", "PROT", "ALBUMIN" in the last 168 hours.   Antibiotics: Anti-infectives (From admission, onward)    None  DVT prophylaxis: SCDs  Code Status: Full code  Family Communication: Discussed with mother at bedside   CONSULTS    Objective    Physical Examination:   General-appears in no acute distress Heart-S1-S2, regular, no murmur auscultated Lungs-clear to auscultation bilaterally, no wheezing or crackles auscultated Abdomen-soft, nontender, no organomegaly Extremities-no edema in the lower extremities Neuro-alert, oriented x3, no focal deficit noted   Status is: Inpatient:             Oswald Hillock   [facility] If 7PM-7AM, please contact night-coverage at [email or web address], Office  [phone]   01/15/2023, 5:31 PM  LOS: 1 day

## 2023-01-15 NOTE — Inpatient Diabetes Management (Signed)
Inpatient Diabetes Program Recommendations  AACE/ADA: New Consensus Statement on Inpatient Glycemic Control (2015)  Target Ranges:  Prepandial:   less than 140 mg/dL      Peak postprandial:   less than 180 mg/dL (1-2 hours)      Critically ill patients:  140 - 180 mg/dL   Lab Results  Component Value Date   GLUCAP 182 (H) 01/15/2023   HGBA1C 8.7 (A) 01/14/2023    Review of Glycemic Control  Diabetes history: DM1 Outpatient Diabetes medications: Medtronic 670 G insulin pump Current orders for Inpatient glycemic control: IV insulin  Inpatient Diabetes Program Recommendations:   Patient had office visit with Dr. Kelton Pillar yesterday @ 8:10 am. Pt. States his CBG was >400 @ his appointment, but did not have insulin pump supplies with him, so had to wait until he arrived home to change site. Changed site and then pump showed tubing not working so changed out his tubing. Changed tubing and insulin started infusing but was already elevated CBGs and Dr. Kelton Pillar notified pt. Was in DKA and to the emergency room. Reviewed rotation of sites with pt. Patient primarily only uses his thighs for placement of insulin pump. Discussed scar tissue with pt and may need to replace site more than one time if site does not work.  Insulin pump supplies with patient. When ready to transition to insulin pump, Over lap insulin pump started one hr. With IV insulin.   Pump   Medtronic  Settings   Insulin type   Novlog     Basal rate          0000 0.750 u/h     0500 0.750    0800 0.700    1200 0.975    1800 1.65    2100 1.35                I:C ratio          0000 15    0800 11    1630 15                Sensitivity          0000  65          Goal          0000  110   Thank you, Laurajean Hosek E. Tamasha Laplante, RN, MSN, CDE  Diabetes Coordinator Inpatient Glycemic Control Team Team Pager [phone] (8am-5pm) 01/15/2023 1:54 PM

## 2023-01-15 NOTE — Clinical Note (Incomplete)
Pt disoriented, not able to tolerate taking PO medications. MD aware

## 2023-01-15 NOTE — Progress Notes (Signed)
Pt's father asked if [facility] could be called because pt and family may have misplaced or left behind pt's insulin kit. Called [facility] and they did not see pt's insulin kit. Left name and number with DB secretary if it does come up. Pt and family made aware.

## 2023-01-16 ENCOUNTER — Telehealth: Payer: Self-pay

## 2023-01-16 DIAGNOSIS — I10 Essential (primary) hypertension: Secondary | ICD-10-CM | POA: Diagnosis not present

## 2023-01-16 DIAGNOSIS — K920 Hematemesis: Secondary | ICD-10-CM | POA: Diagnosis not present

## 2023-01-16 DIAGNOSIS — E1010 Type 1 diabetes mellitus with ketoacidosis without coma: Secondary | ICD-10-CM | POA: Diagnosis not present

## 2023-01-16 DIAGNOSIS — N179 Acute kidney failure, unspecified: Secondary | ICD-10-CM | POA: Diagnosis not present

## 2023-01-16 LAB — BASIC METABOLIC PANEL
Anion gap: 10 (ref 5–15)
Anion gap: 13 (ref 5–15)
BUN: <5 mg/dL — ABNORMAL LOW (ref 6–20)
BUN: <5 mg/dL — ABNORMAL LOW (ref 6–20)
CO2: 22 mmol/L (ref 22–32)
CO2: 23 mmol/L (ref 22–32)
Calcium: 8.7 mg/dL — ABNORMAL LOW (ref 8.9–10.3)
Calcium: 9.3 mg/dL (ref 8.9–10.3)
Chloride: 104 mmol/L (ref 98–111)
Chloride: 100 mmol/L (ref 98–111)
Creatinine, Ser: 0.85 mg/dL (ref 0.61–1.24)
Creatinine, Ser: 0.9 mg/dL (ref 0.61–1.24)
GFR, Estimated: >60 mL/min (ref >60)
GFR, Estimated: >60 mL/min (ref >60)
Glucose, Bld: 175 mg/dL — ABNORMAL HIGH (ref 70–99)
Glucose, Bld: 183 mg/dL — ABNORMAL HIGH (ref 70–99)
Potassium: 3.1 mmol/L — ABNORMAL LOW (ref 3.5–5.1)
Potassium: 3.3 mmol/L — ABNORMAL LOW (ref 3.5–5.1)
Sodium: 136 mmol/L (ref 135–145)
Sodium: 136 mmol/L (ref 135–145)

## 2023-01-16 LAB — CBC
HCT: 35.1 % — ABNORMAL LOW (ref 39.0–52.0)
Hemoglobin: 12.8 g/dL — ABNORMAL LOW (ref 13.0–17.0)
MCH: 31.1 pg (ref 26.0–34.0)
MCHC: 36.5 g/dL — ABNORMAL HIGH (ref 30.0–36.0)
MCV: 85.4 fL (ref 80.0–100.0)
Platelets: 168 10*3/uL (ref 150–400)
RBC: 4.11 MIL/uL — ABNORMAL LOW (ref 4.22–5.81)
RDW: 13.3 % (ref 11.5–15.5)
WBC: 9 10*3/uL (ref 4.0–10.5)
nRBC: 0 % (ref 0.0–0.2)

## 2023-01-16 LAB — GLUCOSE, CAPILLARY
Glucose-Capillary: 169 mg/dL — ABNORMAL HIGH (ref 70–99)
Glucose-Capillary: 178 mg/dL — ABNORMAL HIGH (ref 70–99)
Glucose-Capillary: 190 mg/dL — ABNORMAL HIGH (ref 70–99)
Glucose-Capillary: 218 mg/dL — ABNORMAL HIGH (ref 70–99)

## 2023-01-16 MED ORDER — POTASSIUM CHLORIDE CRYS ER 20 MEQ PO TBCR
40.0000 meq | EXTENDED_RELEASE_TABLET | Freq: Once | ORAL | Status: AC
  Administered 2023-01-16: 40 meq via ORAL
  Filled 2023-01-16: qty 2

## 2023-01-16 MED ORDER — PANTOPRAZOLE SODIUM 40 MG PO TBEC: 40.0000 mg | DELAYED_RELEASE_TABLET | Freq: Every day | ORAL | 0 refills | Status: AC

## 2023-01-16 NOTE — Discharge Summary (Addendum)
Physician Discharge Summary   Patient: Scott Moran MRN: [id number] DOB: 15-Nov-1991  Admit date:     01/14/2023  Discharge date: 01/16/23  Discharge Physician: Oswald Hillock   PCP: Enid Skeens., MD   Recommendations at discharge:   Follow-up PCP in 1 week  Discharge Diagnoses: Principal Problem:   DKA, type 1 ([place]) Active Problems:   Hypothyroidism, acquired, autoimmune   Hypertension   SIRS (systemic inflammatory response syndrome) (HCC)   AKI (acute kidney injury) ([place])   Dark emesis  Resolved Problems:   * No resolved hospital problems. *  Hospital Course:  32 year old male with medical history of type 1 diabetes mellitus, autoimmune thyroiditis, hypertension, hyperlipidemia came to ED with abdominal pain, nausea, vomiting, insulin pump not working.  Found to have DKA on outpatient blood work. He has been having abdominal pain with nausea and vomiting.  He changed insulin pump site and believe that would fix the problem as the pump was not working.  He was seen by endocrinologist, had blood work performed which showed that he had DKA.  Advised to go to ED.   In the ED he was found to have bicarb of 9, anion gap 29, blood glucose 481.  WBC 26,000.  Patient was started on IV fluids, IV insulin and DKA protocol initiated.  Assessment and Plan:  Diabetic ketoacidosis -Secondary to insulin pump not working -Hemoglobin A1c was 8.7% -Patient given IV fluids 4 L and started on IV insulin infusion in the ED -Resolved -IV insulin has been discontinued; patient started on sliding scale insulin with NovoLog -Patient will be discharged home with home regimen with insulin pump   Acute kidney injury -Baseline creatinine 0.9 -Presented with creatinine of 1.40 -Continue IV fluids, lisinopril on hold -Creatinine has improved to 0.97, back to baseline   SIRS -No fever or apparent infectious process -Leukocytosis is likely reactive -WBC is down to 9000    Hypertension -Blood pressure is stable   Hypothyroidism -Continue Synthroid   Dark emesis -No epigastric pain -?  Esophagitis/Mallory-Weiss tear -Continue IV PPI every 12 hours -Hemoglobin stable at 12.8 -No further episodes of vomiting in the hospital; tolerating diet well -Will discharge patient home on Protonix 40 mg p.o. daily   Hypokalemia -Potassium is 3.3 today -Will give K-Dur 40 meq p.o. x 1 before discharge       Consultants:  Procedures performed:   Disposition: Home Diet recommendation:  Discharge Diet Orders (From admission, onward)     Start     Ordered   01/16/23 0000  Diet - low sodium heart healthy        01/16/23 1406           Carb modified diet DISCHARGE MEDICATION: Allergies as of 01/16/2023   No Known Allergies      Medication List     TAKE these medications    acetaminophen 500 MG tablet Commonly known as: TYLENOL Take 1,000 mg by mouth every 6 (six) hours as needed for mild pain.   acetone (urine) test strip Check ketones per protocol   escitalopram 10 MG tablet Commonly known as: LEXAPRO TAKE 1 TABLET BY MOUTH ONCE DAILY What changed:  when to take this reasons to take this   glucagon 1 MG injection Inject 1 mg intramuscularly in thigh 1 time for severe hypoglycemia if unresponsive, unconscious, unable to swallow or has a seizure.   insulin aspart 100 UNIT/ML injection Commonly known as: NovoLOG Max Daily 50 units What changed: See the  new instructions.   levothyroxine 25 MCG tablet Commonly known as: SYNTHROID Take 1 tablet (25 mcg total) by mouth daily.   lisinopril 2.5 MG tablet Commonly known as: ZESTRIL TAKE 1 TABLET BY MOUTH ONCE DAILY   loratadine 10 MG tablet Commonly known as: CLARITIN Take 10 mg by mouth daily as needed for allergies.   pantoprazole 40 MG tablet Commonly known as: Protonix Take 1 tablet (40 mg total) by mouth daily.   pravastatin 10 MG tablet Commonly known as: PRAVACHOL Take  1 tablet (10 mg total) by mouth every evening. Take 20 mg by mouth daily What changed:  how much to take additional instructions        Discharge Exam: Filed Weights   01/14/23 1714 01/14/23 2234  Weight: 69.5 kg 69.9 kg   General-appears in no acute distress Heart-S1-S2, regular, no murmur auscultated Lungs-clear to auscultation bilaterally, no wheezing or crackles auscultated Abdomen-soft, nontender, no organomegaly Extremities-no edema in the lower extremities Neuro-alert, oriented x3, no focal deficit noted  Condition at discharge: good  The results of significant diagnostics from this hospitalization (including imaging, microbiology, ancillary and laboratory) are listed below for reference.   Imaging Studies: [place] EKG SITE RITE  Result Date: 01/15/2023 If Site Rite image not attached, placement could not be confirmed due to current cardiac rhythm.   Microbiology: Results for orders placed or performed during the hospital encounter of 08/22/13  Urine culture     Status: None   Collection Time: 08/22/13  5:16 PM   Specimen: Urine, Random  Result Value Ref Range Status   Specimen Description URINE, CLEAN CATCH  Final   Special Requests ADDED 0122 08/23/13  Final   Culture  Setup Time   Final    08/23/2013 21:20 Performed at [place] Performed at [facility]  Final   Culture NO GROWTH Performed at [facility]  Final   Report Status 08/25/2013 FINAL  Final  Blood Culture (routine x 2)     Status: None   Collection Time: 08/22/13  8:05 PM   Specimen: Blood  Result Value Ref Range Status   Specimen Description BLOOD LEFT HAND  Final   Special Requests BOTTLES DRAWN AEROBIC ONLY [facility]  Final   Culture  Setup Time   Final    08/23/2013 18:10 Performed at [place]   Final    NO GROWTH 5 DAYS Performed at [facility]   Report Status 08/29/2013 FINAL  Final  Blood Culture (routine x 2)      Status: None   Collection Time: 08/22/13  8:13 PM   Specimen: Blood  Result Value Ref Range Status   Specimen Description BLOOD RIGHT HAND  Final   Special Requests BOTTLES DRAWN AEROBIC ONLY [facility]  Final   Culture  Setup Time   Final    08/23/2013 18:10 Performed at [place]   Final    NO GROWTH 5 DAYS Performed at [facility]   Report Status 08/29/2013 FINAL  Final  MRSA PCR Screening     Status: None   Collection Time: 08/23/13 12:37 AM   Specimen: Nasopharyngeal  Result Value Ref Range Status   MRSA by PCR NEGATIVE NEGATIVE Final    Comment:        The GeneXpert MRSA Assay (FDA approved for NASAL specimens only), is one component of a comprehensive MRSA colonization surveillance program. It is not  intended to diagnose MRSA infection nor to guide or monitor treatment for MRSA infections.    Labs: CBC: Recent Labs  Lab 01/14/23 1717 01/14/23 1741 01/14/23 1830 01/15/23 0218 01/16/23 0018  WBC 25.8* 26.1*  --  17.1* 9.0  NEUTROABS  --  23.3*  --   --   --   HGB 17.1* 15.7 16.0 13.9 12.8*  HCT 49.6 44.9 47.0 38.0* 35.1*  MCV 89.9 89.3  --  87.2 85.4  PLT 389 364  --  244 [id number]   Basic Metabolic Panel: Recent Labs  Lab 01/15/23 0851 01/15/23 1502 01/15/23 2004 01/16/23 0018 01/16/23 1148  NA 134* 136 136 136 136  K 4.0 3.5 3.1* 3.1* 3.3*  CL 106 105 105 104 100  CO2 14* 19* 20* 22 23  GLUCOSE 196* 172* 161* 175* 183*  BUN 7 5* <5* <5* <5*  CREATININE 1.12 0.97 0.86 0.85 0.90  CALCIUM 8.7* 8.6* 8.6* 8.7* 9.3   Liver Function Tests: No results for input(s): "AST", "ALT", "ALKPHOS", "BILITOT", "PROT", "ALBUMIN" in the last 168 hours. CBG: Recent Labs  Lab 01/15/23 2148 01/16/23 0002 01/16/23 0501 01/16/23 0811 01/16/23 1156  GLUCAP 183* 178* 190* 218* 169*    Discharge time spent: greater than 30 minutes.  Signed: Oswald Hillock, MD [facility] 01/16/2023

## 2023-01-16 NOTE — Telephone Encounter (Signed)
Mom will stop by and pick up a Accu-chek meter.  It will be placed up front

## 2023-01-16 NOTE — Telephone Encounter (Signed)
Patient's dad came in to office today and picked up Accu-Chek Guide me sample kit.

## 2023-01-16 NOTE — TOC Progression Note (Signed)
Transition of Care [facility]) - Progression Note    Patient Details  Name: Scott Moran MRN: [id number] Date of Birth: 1991-01-15  Transition of Care [facility]) CM/SW Contact  Levonne Lapping, RN Phone Number: 01/16/2023, 2:42 PM  Clinical Narrative:     Patient to DC to home with Parents. CM went to bedside to discuss [facility] consult for financial concerns. Father stated that he was concerned about paying for this hospital stay.  He stated that he "doesn't like owing anyone anything" and likes to pay in full but knows he cannot do that. I suggested that he contact our Financial Billing department to discuss payment options so it isn't so overwhelming. We also discussed [facility] and the fact that there may be additional resources available through them. CM has entered DSS information on Patient AVS.  Pateint and parents appreciative. Parents will transport home. No additional TOC needs        Expected Discharge Plan and Services         Expected Discharge Date: 01/16/23                                     Social Determinants of Health (SDOH) Interventions SDOH Screenings   Food Insecurity: No Food Insecurity (01/15/2023)  Housing: Low Risk  (01/15/2023)  Transportation Needs: No Transportation Needs (01/15/2023)  Utilities: Not At Risk (01/15/2023)  Tobacco Use: Low Risk  (01/14/2023)    Readmission Risk Interventions     No data to display

## 2023-01-18 ENCOUNTER — Telehealth: Payer: Self-pay

## 2023-01-18 NOTE — Telephone Encounter (Signed)
Can we process a PA for Dexcom G7.

## 2023-01-22 DIAGNOSIS — E1010 Type 1 diabetes mellitus with ketoacidosis without coma: Secondary | ICD-10-CM | POA: Diagnosis not present

## 2023-01-22 DIAGNOSIS — N179 Acute kidney failure, unspecified: Secondary | ICD-10-CM | POA: Diagnosis not present

## 2023-02-01 ENCOUNTER — Other Ambulatory Visit (HOSPITAL_COMMUNITY): Payer: Self-pay

## 2023-02-01 ENCOUNTER — Telehealth: Payer: Self-pay

## 2023-02-01 NOTE — Telephone Encounter (Signed)
PA request received via provider for [facility] G7 Receiver device  PA has been submitted via CMM to [place] and is pending determination.   Key: [id number]

## 2023-02-04 NOTE — Telephone Encounter (Signed)
PA has been APPROVED from 02/01/2023-01/31/2024

## 2023-02-28 DIAGNOSIS — Z794 Long term (current) use of insulin: Secondary | ICD-10-CM | POA: Diagnosis not present

## 2023-02-28 DIAGNOSIS — E1065 Type 1 diabetes mellitus with hyperglycemia: Secondary | ICD-10-CM | POA: Diagnosis not present

## 2023-03-30 DIAGNOSIS — Z794 Long term (current) use of insulin: Secondary | ICD-10-CM | POA: Diagnosis not present

## 2023-03-30 DIAGNOSIS — E1065 Type 1 diabetes mellitus with hyperglycemia: Secondary | ICD-10-CM | POA: Diagnosis not present

## 2023-04-29 DIAGNOSIS — E1065 Type 1 diabetes mellitus with hyperglycemia: Secondary | ICD-10-CM | POA: Diagnosis not present

## 2023-04-29 DIAGNOSIS — Z794 Long term (current) use of insulin: Secondary | ICD-10-CM | POA: Diagnosis not present

## 2023-04-30 DIAGNOSIS — Z794 Long term (current) use of insulin: Secondary | ICD-10-CM | POA: Diagnosis not present

## 2023-04-30 DIAGNOSIS — E1065 Type 1 diabetes mellitus with hyperglycemia: Secondary | ICD-10-CM | POA: Diagnosis not present

## 2023-05-01 ENCOUNTER — Ambulatory Visit (INDEPENDENT_AMBULATORY_CARE_PROVIDER_SITE_OTHER): Payer: BC Managed Care – PPO | Admitting: Internal Medicine

## 2023-05-01 ENCOUNTER — Encounter: Payer: Self-pay | Admitting: Internal Medicine

## 2023-05-01 VITALS — BP 116/72 | HR 99 | Ht 67.09 in | Wt 155.0 lb

## 2023-05-01 DIAGNOSIS — E1065 Type 1 diabetes mellitus with hyperglycemia: Secondary | ICD-10-CM | POA: Diagnosis not present

## 2023-05-01 DIAGNOSIS — E063 Autoimmune thyroiditis: Secondary | ICD-10-CM

## 2023-05-01 LAB — POCT GLYCOSYLATED HEMOGLOBIN (HGB A1C): Hemoglobin A1C: 8.5 % — AB (ref 4.0–5.6)

## 2023-05-01 MED ORDER — GLUCAGON (RDNA) 1 MG IJ KIT: PACK | INTRAMUSCULAR | 3 refills | Status: AC

## 2023-05-01 NOTE — Progress Notes (Signed)
Name: Scott Moran  MRN/ DOB: [id number], 11/29/1991   Age/ Sex: 32 y.o., male    PCP: Nonnie Done., MD   Reason for Endocrinology Evaluation: Type 1 Diabetes Mellitus     Date of Initial Endocrinology Visit: 01/14/2023    PATIENT IDENTIFIER: Mr. Hersel Dagle Sheen is a 32 y.o. male with a past medical history of DM. The patient presented for initial endocrinology clinic visit on 01/14/2023 for consultative assistance with his diabetes management.    HPI: Mr. Rampley is accompanied by  his parents Loraine Leriche and Waynetta Sandy    Diagnosed with DM 2/19925 at 63 months of age  Prior Medications tried/Intolerance: Medtronic pump since 5th grade  Currently checking blood sugars 1-4 x / day,no meter Hypoglycemia episodes :not recent             Hemoglobin A1c has ranged from 7.5% in 2020, peaking at 8.9% in 2022.   SUBJECTIVE:   During the last visit (01/14/2023): 8.7 %  Today (05/01/23): Mr. Lansdown is here for follow-up on diabetes management.  He is accompanied by his father today.  He checks his blood sugars 2-4 x daily. The patient has had hypoglycemic episodes since the last clinic visit, he is symptomatic with these episodes.  Today he denies any nausea, vomiting No changes in bowel movements   This patient with type 1 diabetes is treated with Medtronic (insulin pump). During the visit the pump basal and bolus doses were reviewed including carb/insulin rations and supplemental doses. The clinical list was updated. The glucose meter download was reviewed in detail to determine if the current pump settings are providing the best glycemic control without excessive hypoglycemia.  Pump and meter download:    Pump   Medtronic  Settings   Insulin type   Novlog    Basal rate       0000 0.750 u/h    0500 0.750   0800 0.700   1200 0.975   1800 1.65   2100 1.35          I:C ratio       0000 15   0800 11   1630 15          Sensitivity       0000  65       Goal       0000 150    0800 110   2200 150            Type & Model of Pump: Medtronic Insulin Type: Currently using NovoLog.  Body mass index is 24.21 kg/m.  PUMP STATISTICS: Average BG: 186+/- 91 Average Daily Carbs (g): 119 +/- 67 Average Total Daily Insulin: 35 Average Daily Basal: 24.2 (69 %) Average Daily Bolus: 10.8(31 %)    THYROID HISTORY: Patient was diagnosed with immune mediated thyroiditis in 2008, by his previous endocrinologist.  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,neuropathy  Denies: n/a Last eye exam: Completed >1 yr   Macrovascular complications:   Denies: CAD, PVD, CVA   PAST HISTORY: Past Medical History:  Past Medical History:  Diagnosis Date   DM type 1 with diabetic peripheral neuropathy (HCC)    Goiter    Hypertension  Hypoglycemia associated with diabetes (HCC)    Hypogonadotropic hypogonadism (HCC)    Hypothyroidism, acquired, autoimmune    Tachycardia    Thyroiditis, autoimmune    Type 1 diabetes mellitus not at goal [facility])    Type 1 diabetes mellitus with diabetic autonomic neuropathy [facility])    Past Surgical History:  Past Surgical History:  Procedure Laterality Date   None      Social History:  reports that he has never smoked. He has never used smokeless tobacco. He reports that he does not drink alcohol and does not use drugs. Family History:  Family History  Problem Relation Age of Onset   Diabetes Maternal Uncle    Hypertension Maternal Grandmother    Thyroid disease Neg Hx    Obesity Neg Hx    Kidney disease Neg Hx    Cancer Neg Hx    Anemia Neg Hx      HOME MEDICATIONS: Allergies as of 05/01/2023   No Known Allergies      Medication List         Accurate as of May 01, 2023 11:50 AM. If you have any questions, ask your nurse or doctor.          acetaminophen 500 MG tablet Commonly known as: TYLENOL Take 1,000 mg by mouth every 6 (six) hours as needed for mild pain.   acetone (urine) test strip Check ketones per protocol   escitalopram 10 MG tablet Commonly known as: LEXAPRO TAKE 1 TABLET BY MOUTH ONCE DAILY What changed:  when to take this reasons to take this   glucagon 1 MG injection Inject 1 mg intramuscularly in thigh 1 time for severe hypoglycemia if unresponsive, unconscious, unable to swallow or has a seizure.   insulin aspart 100 UNIT/ML injection Commonly known as: NovoLOG Max Daily 50 units What changed:  how much to take additional instructions   levothyroxine 25 MCG tablet Commonly known as: SYNTHROID Take 1 tablet (25 mcg total) by mouth daily.   lisinopril 2.5 MG tablet Commonly known as: ZESTRIL TAKE 1 TABLET BY MOUTH ONCE DAILY   loratadine 10 MG tablet Commonly known as: CLARITIN Take 10 mg by mouth daily as needed for allergies.   pantoprazole 40 MG tablet Commonly known as: Protonix Take 1 tablet (40 mg total) by mouth daily.   pravastatin 10 MG tablet Commonly known as: PRAVACHOL Take 1 tablet (10 mg total) by mouth every evening. Take 20 mg by mouth daily What changed:  how much to take additional instructions         ALLERGIES: No Known Allergies   REVIEW OF SYSTEMS: A comprehensive ROS was conducted with the patient and is negative except as per HPI    OBJECTIVE:   VITAL SIGNS: BP 116/72 (BP Location: Left Arm, Patient Position: Sitting, Cuff Size: Small)   Pulse 99   Ht 5' 7.09" (1.704 m)   Wt 155 lb (70.3 kg)   SpO2 99%   BMI 24.21 kg/m    PHYSICAL EXAM:  General: Pt appears well and is in NAD  Neck: General: Supple without adenopathy or carotid bruits. Thyroid: Thyroid size normal.  No goiter or nodules appreciated.   Lungs: Clear with good BS bilat    Heart: Tachycardic   Abdomen:  soft, nontender  Extremities:  Lower extremities - No pretibial edema.   Neuro: MS is good with appropriate affect, pt is alert and Ox3    DM foot exam: 01/14/2023  The skin of the feet is  intact without sores or ulcerations. The pedal pulses are 2+ on right and 2+ on left. The sensation is intact to a screening 5.07, 10 gram monofilament bilaterally   DATA REVIEWED:  Lab Results  Component Value Date   HGBA1C 8.5 (A) 05/01/2023   HGBA1C 8.7 (A) 01/14/2023   HGBA1C 8.1 (H) 03/06/2022    Latest Reference Range & Units 01/14/23 08:57  Sodium 135 - 145 mEq/L 131 (L)  Potassium 3.5 - 5.1 mEq/L 4.7  Chloride 96 - 112 mEq/L 94 (L)  CO2 19 - 32 mEq/L 13 (L)  Glucose 70 - 99 mg/dL 161 (H)  BUN 6 - 23 mg/dL 15  Creatinine 0.96 - 0.45 mg/dL 4.09  Calcium 8.4 - 81.1 mg/dL 91.4  GFR >78.29 mL/min 84.11  Total CHOL/HDL Ratio  3  Cholesterol 0 - 200 mg/dL 562  HDL Cholesterol >13.08 mg/dL 65.78  LDL (calc) 0 - 99 mg/dL 469 (H)  NonHDL  629.52  Triglycerides 0.0 - 149.0 mg/dL 841.3  VLDL 0.0 - 24.4 mg/dL 01.0    Latest Reference Range & Units 01/14/23 08:57  TSH 0.35 - 5.50 uIU/mL 1.41      ASSESSMENT / PLAN / RECOMMENDATIONS:   1) Type 1 Diabetes Mellitus, poorly controlled, With retinopathic complications - Most recent A1c of 8.5 %. Goal A1c < 7.0 %.     -His A1c has trended down from 8.7% to 8.5% -He was able to obtain the Guardian sensor, I will CDE helped him with this today -I have reviewed his glucose meter manually, this morning around 9 am his BG was 143 mg/DL, 2 hours later his BG was 71 mg/DL.  Patient initially attributed this to having a temporary basal at nighttime, due to hyperglycemia, he increases his basal rate overnight, I was unable to find this on download today, but it did appear that the patient received half a unit correction bolus at around 9 AM with BG of 143 mg/DL, which resulted in a BG decreased to 71 mg/DL.  Patient  did have a candy, that contained approximately 47 g of carbohydrates.  As he perceived symptoms of hypoglycemia. -We discussed definition of hypoglycemia of a BG reading of <70 mg/dL, we discussed consuming 15 g of carbohydrates, patient was advised to always have sugar tablets or 4 ounces juice nearby, and if his BG is >70 mg/dL, but he would like to keep at higher than it should do ~7 g of carbohydrates to prevent overcorrection -The only change I made to his pump today, is adjusting his glucose target from 150 to 130 at night, and from 110 to 120 during the day    MEDICATIONS: Continue NovoLog per pump  EDUCATION / INSTRUCTIONS: BG monitoring instructions: Patient is instructed to check his blood sugars 3 times a day, before each meal. Call [facility] if: BG persistently < 70  I reviewed the Rule of 15 for the treatment of hypoglycemia in detail with the patient. Literature supplied.   2) Diabetic complications:  Eye: Does  have known diabetic retinopathy.  Neuro/ Feet: Does not have known diabetic peripheral neuropathy. Renal: Patient does not have known baseline CKD. He is not on an ACEI/ARB at present.  3) Hypothyroidism:   -TSH has been normal - Pt educated extensively on the correct way to take levothyroxine (first thing in the morning with water, 30 minutes before eating or taking other medications). - Pt encouraged to double dose the following day if she were to miss a dose given  long half-life of levothyroxine.   Medication Continue levothyroxine 25 mcg daily    F/U in 4 months    Signed electronically by: Lyndle Herrlich, MD  [facility]  [facility] [address] [place] [place], [place] [address] Phone: [phone] FAX: [phone]   CC: Nonnie Done., MD [address] [facility] [place] [address] Phone: [phone]  Fax: [phone]    Return to Endocrinology clinic as below: No future  appointments.

## 2023-05-01 NOTE — Patient Instructions (Signed)

## 2023-05-14 DIAGNOSIS — E109 Type 1 diabetes mellitus without complications: Secondary | ICD-10-CM | POA: Diagnosis not present

## 2023-05-17 ENCOUNTER — Other Ambulatory Visit (HOSPITAL_COMMUNITY): Payer: Self-pay

## 2023-05-21 ENCOUNTER — Other Ambulatory Visit (HOSPITAL_COMMUNITY): Payer: Self-pay

## 2023-05-21 ENCOUNTER — Telehealth: Payer: Self-pay

## 2023-05-21 NOTE — Telephone Encounter (Signed)
Patient Advocate Encounter   Received notification from [facility] that prior authorization is required for [facility] Diagnostic 1MG  solution  Submitted: 05/21/23 Key [id number]  Awaiting clinical questions

## 2023-05-27 NOTE — Telephone Encounter (Signed)
Clinical info sent 05/27/23

## 2023-06-11 DIAGNOSIS — J069 Acute upper respiratory infection, unspecified: Secondary | ICD-10-CM | POA: Diagnosis not present

## 2023-06-17 DIAGNOSIS — E1065 Type 1 diabetes mellitus with hyperglycemia: Secondary | ICD-10-CM | POA: Diagnosis not present

## 2023-06-17 DIAGNOSIS — Z794 Long term (current) use of insulin: Secondary | ICD-10-CM | POA: Diagnosis not present

## 2023-06-25 ENCOUNTER — Other Ambulatory Visit (HOSPITAL_COMMUNITY): Payer: Self-pay

## 2023-06-25 NOTE — Telephone Encounter (Signed)
Pharmacy Patient Advocate Encounter  Received notification from [facility] that Prior Authorization for [place] has been CANCELLED due to Medication doesn't require a PA at this time .  Called the pharmacy after I figured out they will not cover the [employer] one. (Glucagon is a generic, but the test claim rejection kept saying improper DAW code, generic available.) They were able to process, but will need to order.

## 2023-06-26 MED ORDER — GLUCAGON EMERGENCY 1 MG/ML IJ SOLR: INTRAMUSCULAR | 2 refills | Status: AC

## 2023-06-26 NOTE — Telephone Encounter (Signed)
Medication resent as it was canceled by PA team so they can fill generic

## 2023-06-26 NOTE — Addendum Note (Signed)
Addended by: Lisabeth Pick on: 06/26/2023 09:31 AM   Modules accepted: Orders

## 2023-07-03 DIAGNOSIS — K219 Gastro-esophageal reflux disease without esophagitis: Secondary | ICD-10-CM | POA: Diagnosis not present

## 2023-07-26 DIAGNOSIS — R1031 Right lower quadrant pain: Secondary | ICD-10-CM | POA: Diagnosis not present

## 2023-07-26 DIAGNOSIS — R1013 Epigastric pain: Secondary | ICD-10-CM | POA: Diagnosis not present

## 2023-07-26 DIAGNOSIS — R1011 Right upper quadrant pain: Secondary | ICD-10-CM | POA: Diagnosis not present

## 2023-07-26 DIAGNOSIS — K219 Gastro-esophageal reflux disease without esophagitis: Secondary | ICD-10-CM | POA: Diagnosis not present

## 2023-07-29 DIAGNOSIS — K8020 Calculus of gallbladder without cholecystitis without obstruction: Secondary | ICD-10-CM | POA: Diagnosis not present

## 2023-07-29 DIAGNOSIS — R1011 Right upper quadrant pain: Secondary | ICD-10-CM | POA: Diagnosis not present

## 2023-07-29 DIAGNOSIS — K838 Other specified diseases of biliary tract: Secondary | ICD-10-CM | POA: Diagnosis not present

## 2023-07-29 DIAGNOSIS — R109 Unspecified abdominal pain: Secondary | ICD-10-CM | POA: Diagnosis not present

## 2023-07-30 ENCOUNTER — Telehealth: Payer: Self-pay

## 2023-07-30 ENCOUNTER — Ambulatory Visit (INDEPENDENT_AMBULATORY_CARE_PROVIDER_SITE_OTHER): Payer: BC Managed Care – PPO | Admitting: Internal Medicine

## 2023-07-30 VITALS — BP 105/90 | HR 110 | Ht 67.0 in | Wt 147.6 lb

## 2023-07-30 DIAGNOSIS — K810 Acute cholecystitis: Secondary | ICD-10-CM | POA: Diagnosis not present

## 2023-07-30 DIAGNOSIS — R1011 Right upper quadrant pain: Secondary | ICD-10-CM | POA: Diagnosis not present

## 2023-07-30 DIAGNOSIS — K828 Other specified diseases of gallbladder: Secondary | ICD-10-CM | POA: Diagnosis not present

## 2023-07-30 DIAGNOSIS — E1065 Type 1 diabetes mellitus with hyperglycemia: Secondary | ICD-10-CM | POA: Insufficient documentation

## 2023-07-30 DIAGNOSIS — Z794 Long term (current) use of insulin: Secondary | ICD-10-CM | POA: Diagnosis not present

## 2023-07-30 DIAGNOSIS — E063 Autoimmune thyroiditis: Secondary | ICD-10-CM | POA: Diagnosis not present

## 2023-07-30 DIAGNOSIS — E039 Hypothyroidism, unspecified: Secondary | ICD-10-CM | POA: Insufficient documentation

## 2023-07-30 DIAGNOSIS — Z9641 Presence of insulin pump (external) (internal): Secondary | ICD-10-CM | POA: Diagnosis not present

## 2023-07-30 DIAGNOSIS — E10649 Type 1 diabetes mellitus with hypoglycemia without coma: Secondary | ICD-10-CM | POA: Diagnosis not present

## 2023-07-30 DIAGNOSIS — K8050 Calculus of bile duct without cholangitis or cholecystitis without obstruction: Secondary | ICD-10-CM | POA: Diagnosis not present

## 2023-07-30 LAB — POCT GLYCOSYLATED HEMOGLOBIN (HGB A1C): Hemoglobin A1C: 8.2 % — AB (ref 4.0–5.6)

## 2023-07-30 MED ORDER — DEXCOM G7 SENSOR MISC: 1.0000 | 3 refills | Status: AC

## 2023-07-30 NOTE — Progress Notes (Signed)
Name: Scott Moran  MRN/ DOB: [id number], 11-Dec-1990   Age/ Sex: 32 y.o., male    PCP: Nonnie Done., MD   Reason for Endocrinology Evaluation: Type 1 Diabetes Mellitus     Date of Initial Endocrinology Visit: 01/14/2023    PATIENT IDENTIFIER: Scott Moran is a 32 y.o. male with a past medical history of DM. The patient presented for initial endocrinology clinic visit on 01/14/2023 for consultative assistance with his diabetes management.    HPI: Scott Moran is accompanied by  his parents Loraine Leriche and Waynetta Sandy    Diagnosed with DM 2/19957 at 28 months of age  Prior Medications tried/Intolerance: Medtronic pump since 5th grade          Hemoglobin A1c has ranged from 7.5% in 2020, peaking at 8.9% in 2022.      SUBJECTIVE:   During the last visit (05/01/2023): 8.5%  Today (07/30/23): Scott Moran is here for follow-up on diabetes management.  He is accompanied by his father today.  He checks his blood sugars 2-4 x daily.  He has not been using the Guardian sensor due to inaccurate results.  The patient has had hypoglycemic episodes since the last clinic visit, he is symptomatic with these episodes.  He was recently evaluated by his PCP and was noted to have gallstones, he was referred to emergency room by his PCP for surgical evaluation  Patient did have an episode of vomiting and was having right-sided back pain, he also has history of chronic GERD on PPI  Mother is pending ovarian cyst removal at [facility]   This patient with type 1 diabetes is treated with Medtronic (insulin pump). During the visit the pump basal and bolus doses were reviewed including carb/insulin rations and supplemental doses. The clinical list was updated. The glucose meter download was reviewed in detail to determine if the current pump settings are providing the best glycemic control without excessive hypoglycemia.  Pump and meter download:    Pump   Medtronic  Settings   Insulin  type   Novlog    Basal rate       0000 0.750 u/h    0500 0.750   0800 0.700   1200 0.975   1800 1.65   2100 1.35          I:C ratio       0000 15   0800 11   1630 15          Sensitivity       0000  65       Goal       0000 130   0800 120   2200 130            Type & Model of Pump: Medtronic Insulin Type: Currently using NovoLog.  Body mass index is 23.12 kg/m.  PUMP STATISTICS: Average BG: 243+/- 76 Average Daily Carbs (g): 119 +/- 67 Average Total Daily Insulin: 39 Average Daily Basal: 24.2 (69 %) Average Daily Bolus: 10.8(31 %)    THYROID HISTORY: Patient was diagnosed with immune mediated thyroiditis in 2008, by his previous endocrinologist.  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,neuropathy  Denies: n/a Last  eye exam: Completed >1 yr   Macrovascular complications:   Denies: CAD, PVD, CVA   PAST HISTORY: Past Medical History:  Past Medical History:  Diagnosis Date   DM type 1 with diabetic peripheral neuropathy (HCC)    Goiter    Hypertension    Hypoglycemia associated with diabetes (HCC)    Hypogonadotropic hypogonadism (HCC)    Hypothyroidism, acquired, autoimmune    Tachycardia    Thyroiditis, autoimmune    Type 1 diabetes mellitus not at goal [facility])    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 He has never used smokeless tobacco. He reports that he does not drink alcohol and does not use drugs. Family History:  Family History  Problem Relation Age of Onset   Diabetes Maternal  Uncle    Hypertension Maternal Grandmother    Thyroid disease Neg Hx    Obesity Neg Hx    Kidney disease Neg Hx    Cancer Neg Hx    Anemia Neg Hx      HOME MEDICATIONS: Allergies as of 07/30/2023   No Known Allergies      Medication List        Accurate as of July 30, 2023 12:24 PM. If you have any questions, ask your nurse or doctor.          acetaminophen 500 MG tablet Commonly known as: TYLENOL Take 1,000 mg by mouth every 6 (six) hours as needed for mild pain.   acetone (urine) test strip Check ketones per protocol   escitalopram 10 MG tablet Commonly known as: LEXAPRO TAKE 1 TABLET BY MOUTH ONCE DAILY What changed:  when to take this reasons to take this   glucagon 1 MG injection Inject 1 mg intramuscularly in thigh 1 time for severe hypoglycemia if unresponsive, unconscious, unable to swallow or has a seizure.   Glucagon Emergency 1 MG/ML Solr 1 mg intramuscularly in thigh 1 time for severe hypoglycemia if unresponsive, unconscious, unable to swallow or has a seizure.   insulin aspart 100 UNIT/ML injection Commonly known as: NovoLOG Max Daily 50 units What changed:  how much to take additional instructions   lansoprazole 30 MG capsule Commonly known as: PREVACID Take 30 mg by mouth daily at 12 noon.   levothyroxine 25 MCG tablet Commonly known as: SYNTHROID Take 1 tablet (25 mcg total) by mouth daily.   lisinopril 2.5 MG tablet Commonly known as: ZESTRIL TAKE 1 TABLET BY MOUTH ONCE DAILY   loratadine 10 MG tablet Commonly known as: CLARITIN Take 10 mg by mouth daily as needed for allergies.   pantoprazole 40 MG tablet Commonly known as: Protonix Take 1 tablet (40 mg total) by mouth daily.   pravastatin 10 MG tablet Commonly known as: PRAVACHOL Take 1 tablet (10 mg total) by mouth every evening. Take 20 mg by mouth daily         ALLERGIES: No Known Allergies   REVIEW OF SYSTEMS: A comprehensive ROS was conducted with the  patient and is negative except as per HPI    OBJECTIVE:   VITAL SIGNS: BP (!) 105/90   Pulse (!) 110   Ht 5\' 7"  (1.702 m)   Wt 147 lb 9.6 oz (67 kg)   SpO2 98%   BMI 23.12 kg/m    PHYSICAL EXAM:  General:  Pt appears well and is in NAD  Lungs: Clear with good BS bilat   Heart: RRR  Abdomen:  nontender  Extremities:  Lower extremities - No pretibial edema.   Neuro: MS is good with appropriate affect, pt is alert and Ox3    DM foot exam: 01/14/2023  The skin of the feet is intact without sores or ulcerations. The pedal pulses are 2+ on right and 2+ on left. The sensation is intact to a screening 5.07, 10 gram monofilament bilaterally   DATA REVIEWED:  Lab Results  Component Value Date   HGBA1C 8.5 (A) 05/01/2023   HGBA1C 8.7 (A) 01/14/2023   HGBA1C 8.1 (H) 03/06/2022    07/26/2023 BUN 7 Cr. 0.85 GFR 119 Alk 375  ALT 480   ASSESSMENT / PLAN / RECOMMENDATIONS:   1) Type 1 Diabetes Mellitus, poorly controlled, With retinopathic complications - Most recent A1c of 8.2 %. Goal A1c < 7.0 %.    -A1c continues to trend down -He is having GI issues that been attributed to gallstones, his PCP referred him to the emergency room for surgical evaluation, patient will report [facility] the ED [facility] -He has not been able to use the guardian as it is not accurate, patient prefers Dexcom, a prescription for Dexcom G7 has been sent to the pharmacy, patient understands that he will be entering all glucose data into the Medtronic pump, he prefers to stay on the Medtronic pump at this time -He was provided with a #1 sensor and Dexcom receiver sample -I have adjusted his insulin to carb ratio as below    MEDICATIONS: Continue NovoLog per pump      Pump   Medtronic  Settings   Insulin type   Novlog    Basal rate       0000 0.750 u/h    0500 0.750   0800 0.700   1200 0.975   1800 1.65   2100 1.35          I:C ratio       0000 13   0800 11   1630 13           Sensitivity       0000  65       Goal       0000 130   0800 120   2200 130        EDUCATION / INSTRUCTIONS: BG monitoring instructions: Patient is instructed to check his blood sugars 3 times a day, before each meal. Call [place] [facility] if: BG persistently < 70  I reviewed the Rule of 15 for the treatment of hypoglycemia in detail with the patient. Literature supplied.   2) Diabetic complications:  Eye: Does  have known diabetic retinopathy.  Neuro/ Feet: Does not have known diabetic peripheral neuropathy. Renal: Patient does not have known baseline CKD. He is not on an ACEI/ARB at present.  3) Hypothyroidism:   -TSH has been normal  Medication Continue levothyroxine 25 mcg daily  4)Choledocholithiasis:  -He was diagnosed with this recently through his PCPs office, pending surgical  -CMP and ultrasound results reviewed   F/U in 4 months    Signed electronically by: Lyndle Herrlich, MD  [facility]  [facility] [address] [place] [place], [place] [address] Phone: [phone] FAX: [phone]   CC: Nonnie Done., MD [address] [facility] [place] [address] Phone: [phone]  Fax: [phone]    Return to  Endocrinology clinic as below: No future appointments.

## 2023-07-30 NOTE — Telephone Encounter (Signed)
LMx1 to call office to reschedule appointment.

## 2023-07-30 NOTE — Telephone Encounter (Signed)
VM left that patient would not be a today appointment due to having to go in hospital. Please contact for reschedule.

## 2023-07-30 NOTE — Patient Instructions (Signed)

## 2023-07-31 ENCOUNTER — Encounter: Payer: Self-pay | Admitting: Internal Medicine

## 2023-07-31 DIAGNOSIS — K812 Acute cholecystitis with chronic cholecystitis: Secondary | ICD-10-CM | POA: Diagnosis not present

## 2023-07-31 DIAGNOSIS — K8020 Calculus of gallbladder without cholecystitis without obstruction: Secondary | ICD-10-CM | POA: Diagnosis not present

## 2023-07-31 DIAGNOSIS — R17 Unspecified jaundice: Secondary | ICD-10-CM | POA: Diagnosis not present

## 2023-07-31 DIAGNOSIS — K660 Peritoneal adhesions (postprocedural) (postinfection): Secondary | ICD-10-CM | POA: Diagnosis not present

## 2023-07-31 DIAGNOSIS — K810 Acute cholecystitis: Secondary | ICD-10-CM | POA: Diagnosis not present

## 2023-10-02 DIAGNOSIS — Z794 Long term (current) use of insulin: Secondary | ICD-10-CM | POA: Diagnosis not present

## 2023-10-02 DIAGNOSIS — E1065 Type 1 diabetes mellitus with hyperglycemia: Secondary | ICD-10-CM | POA: Diagnosis not present

## 2023-11-05 DIAGNOSIS — Z794 Long term (current) use of insulin: Secondary | ICD-10-CM | POA: Diagnosis not present

## 2023-11-05 DIAGNOSIS — E1065 Type 1 diabetes mellitus with hyperglycemia: Secondary | ICD-10-CM | POA: Diagnosis not present

## 2023-11-28 ENCOUNTER — Encounter: Payer: Self-pay | Admitting: Internal Medicine

## 2023-11-28 ENCOUNTER — Ambulatory Visit (INDEPENDENT_AMBULATORY_CARE_PROVIDER_SITE_OTHER): Payer: BC Managed Care – PPO | Admitting: Internal Medicine

## 2023-11-28 VITALS — BP 118/78 | HR 104 | Resp 16 | Ht 67.0 in | Wt 149.8 lb

## 2023-11-28 DIAGNOSIS — E1065 Type 1 diabetes mellitus with hyperglycemia: Secondary | ICD-10-CM

## 2023-11-28 DIAGNOSIS — E039 Hypothyroidism, unspecified: Secondary | ICD-10-CM | POA: Diagnosis not present

## 2023-11-28 LAB — POCT GLYCOSYLATED HEMOGLOBIN (HGB A1C): Hemoglobin A1C: 8.4 % — AB (ref 4.0–5.6)

## 2023-11-28 MED ORDER — LEVOTHYROXINE SODIUM 25 MCG PO TABS: 25.0000 ug | ORAL_TABLET | Freq: Every day | ORAL | 3 refills | Status: AC

## 2023-11-28 MED ORDER — INSULIN ASPART 100 UNIT/ML IJ SOLN: INTRAMUSCULAR | 4 refills | Status: AC

## 2023-11-28 NOTE — Patient Instructions (Addendum)
Please contact tandem at [phone] to initiate a pump order   - HOW TO TREAT LOW BLOOD SUGARS (Blood sugar LESS THAN 70 MG/DL) Please follow the RULE OF 15 for the treatment of hypoglycemia treatment (when your (blood sugars are less than 70 mg/dL)   STEP 1: Take 15 grams of carbohydrates when your blood sugar is low, which includes:  3-4 GLUCOSE TABS  OR 3-4 OZ OF JUICE OR REGULAR SODA OR ONE TUBE OF GLUCOSE GEL    STEP 2: RECHECK blood sugar in 15 MINUTES STEP 3: If your blood sugar is still low at the 15 minute recheck --> then, go back to STEP 1 and treat AGAIN with another 15 grams of carbohydrates.

## 2023-11-28 NOTE — Progress Notes (Signed)
Name: Scott Moran  MRN/ DOB: [id number], 1990/12/25   Age/ Sex: 32 y.o., male    PCP: Nonnie Done., MD   Reason for Endocrinology Evaluation: Type 1 Diabetes Mellitus     Date of Initial Endocrinology Visit: 01/14/2023    PATIENT IDENTIFIER: Scott Moran is a 32 y.o. male with a past medical history of DM. The patient presented for initial endocrinology clinic visit on 01/14/2023 for consultative assistance with his diabetes management.    HPI: Scott Moran   Diagnosed with DM 2/19960 at 14 months of age  Prior Medications tried/Intolerance: Medtronic pump since 5th grade          Hemoglobin A1c has ranged from 7.5% in 2020, peaking at 8.9% in 2022.    THYROID HISTORY: Patient was diagnosed with immune mediated thyroiditis in 2008, by his previous endocrinologist.  He has been on LT-4 replacement Anti-TPO antibodies were undetectable in 2015 Max TSH was 4.301 u IU/mL in 2012    SUBJECTIVE:   During the last visit (07/30/2023): 8.2%    Today (11/28/23): Scott Moran is here for follow-up on diabetes management.  He is accompanied by his father today.  He checks his blood sugars 2-4 x daily.     He is s/p cholecystectomy 07/2023, recovered well  No nausea or vomiting, but has noted changes in bowel movements in the form of loose stool He has not been taking his oral medications since August but restarted 2 weeks ago including levothyroxine   This patient with type 1 diabetes is treated with Medtronic (insulin pump). During the visit the pump basal and bolus doses were reviewed including carb/insulin rations and supplemental doses. The clinical list was updated. The glucose meter download was reviewed in detail to determine if the current pump settings are providing the best glycemic control without excessive hypoglycemia.  Pump and meter download:    Pump   Medtronic  Settings   Insulin type   Novlog    Basal rate       0000 0.750 u/h    0500 0.750    0800 0.700   1200 0.975   1800 1.65   2100 1.35          I:C ratio       0000 15   0800 11   1630 15          Sensitivity       0000  65       Goal       0000 130   0800 120   2200 130            Type & Model of Pump: Medtronic Insulin Type: Currently using NovoLog.  Body mass index is 23.46 kg/m.  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,neuropathy  Denies: n/a Last eye exam: Completed >1 yr   Macrovascular complications:   Denies: CAD, PVD, CVA   PAST HISTORY: Past Medical History:  Past Medical History:  Diagnosis Date   DM type 1 with diabetic peripheral neuropathy (HCC)    Goiter    Hypertension    Hypoglycemia associated with diabetes (  HCC)    Hypogonadotropic hypogonadism (HCC)    Hypothyroidism, acquired, autoimmune    Tachycardia    Thyroiditis, autoimmune    Type 1 diabetes mellitus not at goal [facility])    Type 1 diabetes mellitus with diabetic autonomic neuropathy [facility])    Past Surgical History:  Past Surgical History:  Procedure Laterality Date   None      Social History:  reports that he has never smoked. He has never used smokeless tobacco. He reports that he does not drink alcohol and does not use drugs. Family History:  Family History  Problem Relation Age of Onset   Diabetes Maternal Uncle    Hypertension Maternal Grandmother    Thyroid disease Neg Hx    Obesity Neg Hx    Kidney disease Neg Hx    Cancer Neg Hx    Anemia Neg Hx      HOME MEDICATIONS: Allergies as of 11/28/2023   No Known Allergies      Medication List        Accurate as of November 28, 2023  11:55 AM. If you have any questions, ask your nurse or doctor.          acetaminophen 500 MG tablet Commonly known as: TYLENOL Take 1,000 mg by mouth every 6 (six) hours as needed for mild pain.   acetone (urine) test strip Check ketones per protocol   Dexcom G7 Sensor Misc 1 Device by Does not apply route as directed.   escitalopram 10 MG tablet Commonly known as: LEXAPRO TAKE 1 TABLET BY MOUTH ONCE DAILY What changed:  when to take this reasons to take this   glucagon 1 MG injection Inject 1 mg intramuscularly in thigh 1 time for severe hypoglycemia if unresponsive, unconscious, unable to swallow or has a seizure.   Glucagon Emergency 1 MG/ML Solr 1 mg intramuscularly in thigh 1 time for severe hypoglycemia if unresponsive, unconscious, unable to swallow or has a seizure.   insulin aspart 100 UNIT/ML injection Commonly known as: NovoLOG Max Daily 50 units What changed:  how much to take additional instructions   lansoprazole 30 MG capsule Commonly known as: PREVACID Take 30 mg by mouth daily at 12 noon.   levothyroxine 25 MCG tablet Commonly known as: SYNTHROID Take 1 tablet (25 mcg total) by mouth daily.   lisinopril 2.5 MG tablet Commonly known as: ZESTRIL TAKE 1 TABLET BY MOUTH ONCE DAILY   loratadine 10 MG tablet Commonly known as: CLARITIN Take 10 mg by mouth daily as needed for allergies.   pantoprazole 40 MG tablet Commonly known as: Protonix Take 1 tablet (40 mg total) by mouth daily.   pravastatin 10 MG tablet Commonly known as: PRAVACHOL Take 1 tablet (10 mg total) by mouth every evening. Take 20 mg by mouth daily         ALLERGIES: No Known Allergies   REVIEW OF SYSTEMS: A comprehensive ROS was conducted with the patient and is negative except as per HPI    OBJECTIVE:   VITAL SIGNS: BP 118/78   Pulse (!) 104   Resp 16   Ht 5\' 7"  (1.702 m)   Wt 149 lb 12.8 oz (67.9 kg)   SpO2 98%   BMI 23.46 kg/m    PHYSICAL EXAM:   General: Pt appears well and is in NAD  Lungs: Clear with good BS bilat   Heart: RRR  Abdomen:  nontender  Extremities:  Lower extremities - No pretibial edema.   Neuro: MS is  good with appropriate affect, pt is alert and Ox3    DM foot exam: 01/14/2023  The skin of the feet is intact without sores or ulcerations. The pedal pulses are 2+ on right and 2+ on left. The sensation is intact to a screening 5.07, 10 gram monofilament bilaterally   DATA REVIEWED:  Lab Results  Component Value Date   HGBA1C 8.4 (A) 11/28/2023   HGBA1C 8.2 (A) 07/30/2023   HGBA1C 8.5 (A) 05/01/2023    07/31/2023 BUN 7 Cr. 0.66 GFR 90 Na 137 K 3.9   ASSESSMENT / PLAN / RECOMMENDATIONS:   1) Type 1 Diabetes Mellitus, poorly controlled, With retinopathic complications - Most recent A1c of 8.4 %. Goal A1c < 7.0 %.    -A1c continues to be above goal -Unable to use the Guardian, checks glucose twice daily, which does not coincide with carb counting, hence glycemic excursions -I have recommended switching to tandem pump, he was given contact information -He was provided with Dexcom sensors in the past - He was again encouraged to check glucose before each meal, and to enter it into the pump with carbohydrates consumed, as of now he checks glucose twice a day and they seem to be at random times and do not coincide with boluses  MEDICATIONS: Continue NovoLog per pump      Pump   Medtronic  Settings   Insulin type   Novlog    Basal rate       0000 0.750 u/h    0500 0.750   0800 0.700   1200 0.975   1800 1.65   2100 1.35          I:C ratio       0000 13   0800 11   1630 13          Sensitivity       0000  65       Goal       0000 130   0800 120   2200 130        EDUCATION / INSTRUCTIONS: BG monitoring instructions: Patient is instructed to check his blood sugars 3 times a day, before each meal. Call [place] [facility] if: BG persistently < 70  I reviewed the Rule  of 15 for the treatment of hypoglycemia in detail with the patient. Literature supplied.   2) Diabetic complications:  Eye: Does  have known diabetic retinopathy.  Neuro/ Feet: Does not have known diabetic peripheral neuropathy. Renal: Patient does not have known baseline CKD. He is not on an ACEI/ARB at present.  3) Hypothyroidism:   -He has not taken levothyroxine since August, but restarted 2 weeks ago -We will not check TFTs today as they will be off -Will recheck by next visit  Medication Continue levothyroxine 25 mcg daily     F/U in 4 months   I spent 30 minutes preparing to see the patient by review of recent labs, imaging and procedures, obtaining and reviewing separately obtained history, communicating with the patient/family or caregiver, ordering medications, tests or procedures, and documenting clinical information in the EHR including the differential Dx, treatment, and any further evaluation and other management    Signed electronically by: Lyndle Herrlich, MD  [facility]  [facility] [address] [place] [place], [place] [address] Phone: [phone] FAX: [phone]   CC: Nonnie Done., MD [address] [facility] [place] [address] Phone: [phone]  Fax: [phone]    Return to  Endocrinology clinic as below: Future Appointments  Date Time Provider Department Center  04/01/2024 10:50 AM Jaymie Mckiddy, Konrad Dolores, MD [id number] None

## 2023-12-05 ENCOUNTER — Telehealth: Payer: Self-pay

## 2023-12-05 ENCOUNTER — Encounter: Payer: Self-pay | Admitting: Internal Medicine

## 2023-12-18 ENCOUNTER — Ambulatory Visit: Payer: BC Managed Care – PPO | Admitting: Internal Medicine

## 2024-02-28 NOTE — Telephone Encounter (Signed)
 Done

## 2024-03-05 DIAGNOSIS — E7801 Familial hypercholesterolemia: Secondary | ICD-10-CM | POA: Diagnosis not present

## 2024-03-05 DIAGNOSIS — E1042 Type 1 diabetes mellitus with diabetic polyneuropathy: Secondary | ICD-10-CM | POA: Diagnosis not present

## 2024-03-05 DIAGNOSIS — K219 Gastro-esophageal reflux disease without esophagitis: Secondary | ICD-10-CM | POA: Diagnosis not present

## 2024-03-05 DIAGNOSIS — E039 Hypothyroidism, unspecified: Secondary | ICD-10-CM | POA: Diagnosis not present

## 2024-04-01 ENCOUNTER — Encounter: Payer: Self-pay | Admitting: Internal Medicine

## 2024-04-01 ENCOUNTER — Ambulatory Visit: Payer: BC Managed Care – PPO | Admitting: Internal Medicine

## 2024-04-01 VITALS — BP 124/80 | HR 112 | Ht 67.0 in | Wt 151.0 lb

## 2024-04-01 DIAGNOSIS — E785 Hyperlipidemia, unspecified: Secondary | ICD-10-CM

## 2024-04-01 DIAGNOSIS — E039 Hypothyroidism, unspecified: Secondary | ICD-10-CM

## 2024-04-01 DIAGNOSIS — E1065 Type 1 diabetes mellitus with hyperglycemia: Secondary | ICD-10-CM | POA: Diagnosis not present

## 2024-04-01 DIAGNOSIS — Z4681 Encounter for fitting and adjustment of insulin pump: Secondary | ICD-10-CM

## 2024-04-01 LAB — POCT GLYCOSYLATED HEMOGLOBIN (HGB A1C): Hemoglobin A1C: 8.4 % — AB (ref 4.0–5.6)

## 2024-04-01 NOTE — Patient Instructions (Signed)

## 2024-04-01 NOTE — Progress Notes (Signed)
 Name: Scott Moran  MRN/ DOB: [id number], [birth year]   Age/ Sex: 33 y.o., male    PCP: Lucius Sabins., MD   Reason for Endocrinology Evaluation: Type 1 Diabetes Mellitus     Date of Initial Endocrinology Visit: 01/14/2023    PATIENT IDENTIFIER: Scott Moran is a 33 y.o. male with a past medical history of DM. The patient presented for initial endocrinology clinic visit on 01/14/2023 for consultative assistance with his diabetes management.    HPI: Scott Moran   Diagnosed with DM 2/19941 at 49 months of age  Prior Medications tried/Intolerance: Medtronic pump since 5th grade          Hemoglobin A1c has ranged from 7.5% in 2020, peaking at 8.9% in 2022.    THYROID  HISTORY: Patient was diagnosed with immune mediated thyroiditis in 2008, by his previous endocrinologist.  He has been on LT-4 replacement Anti-TPO antibodies were undetectable in 2015 Max TSH was 4.301 u IU/mL in 2012    SUBJECTIVE:   During the last visit (11/28/2023): 8.4%    Today (04/01/24): Scott Moran is here for follow-up on diabetes management.  He is accompanied by his father today.  He checks his blood sugars 1-2x daily.    The patient has been noted with hypoglycemia, he is symptomatic  Denies nausea or vomiting  Denies constipation or diarrhea  Patient continue to state that when he wakes up in the morning he " feels that his glucose is high" and he put on a temporary basal Patient also states that when he eats [place] food, when he tries to eat the leftovers he starts to sweat, he looks it up online and believes he may have gustatory sweating?   This patient with type 1 diabetes is treated with Medtronic (insulin  pump). During the visit the pump basal and bolus doses were reviewed including carb/insulin  rations and supplemental doses. The clinical list was updated. The glucose meter download was reviewed in detail to determine if the current pump settings are providing the best glycemic  control without excessive hypoglycemia.  Pump and meter download:    Pump   Medtronic  Settings   Insulin  type   Novlog    Basal rate       0000 0.750 u/h    0500 0.750   0800 0.700   1200 0.975   1800 1.65   2100 1.35          I:C ratio       0000 15   0800 11   1630 15          Sensitivity       0000  65       Goal       0000 130   0800 120   2200 130            Type & Model of Pump: Medtronic Insulin  Type: Currently using NovoLog .  Body mass index is 23.65 kg/m.  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,neuropathy  Denies: n/a Last eye exam: Completed >1 yr   Macrovascular complications:   Denies: CAD, PVD,  CVA   PAST HISTORY: Past Medical History:  Past Medical History:  Diagnosis Date   DM type 1 with diabetic peripheral neuropathy (HCC)    Goiter    Hypertension    Hypoglycemia associated with diabetes (HCC)    Hypogonadotropic hypogonadism (HCC)    Hypothyroidism, acquired, autoimmune    Tachycardia    Thyroiditis, autoimmune    Type 1 diabetes mellitus not at goal [facility])    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 He has never used smokeless tobacco. He reports that he does not drink alcohol and does not use drugs. Family History:  Family History  Problem Relation Age of Onset   Diabetes Maternal Uncle    Hypertension Maternal Grandmother    Thyroid  disease Neg Hx    Obesity Neg Hx    Kidney  disease Neg Hx    Cancer Neg Hx    Anemia Neg Hx      HOME MEDICATIONS: Allergies as of 04/01/2024   No Known Allergies      Medication List        Accurate as of April 01, 2024 10:39 AM. If you have any questions, ask your nurse or doctor.          acetaminophen  500 MG tablet Commonly known as: TYLENOL  Take 1,000 mg by mouth every 6 (six) hours as needed for mild pain.   acetone (urine) test strip Check ketones per protocol   Dexcom G7 Sensor Misc 1 Device by Does not apply route as directed.   escitalopram  10 MG tablet Commonly known as: LEXAPRO  TAKE 1 TABLET BY MOUTH ONCE DAILY What changed:  when to take this reasons to take this   Flublok 0.5 ML Sosy Generic drug: Influenza Vac Recomb HA (PF)   glucagon  1 MG injection Inject 1 mg intramuscularly in thigh 1 time for severe hypoglycemia if unresponsive, unconscious, unable to swallow or has a seizure.   Glucagon  Emergency 1 MG/ML Solr 1 mg intramuscularly in thigh 1 time for severe hypoglycemia if unresponsive, unconscious, unable to swallow or has a seizure.   insulin  aspart 100 UNIT/ML injection Commonly known as: NovoLOG  Max Daily 50 units   lansoprazole 30 MG capsule Commonly known as: PREVACID Take 30 mg by mouth daily at 12 noon.   levothyroxine  25 MCG tablet Commonly known as: SYNTHROID  Take 1 tablet (25 mcg total) by mouth daily.   lisinopril  2.5 MG tablet Commonly known as: ZESTRIL  TAKE 1 TABLET BY MOUTH ONCE DAILY   loratadine 10 MG tablet Commonly known as: CLARITIN Take 10 mg by mouth daily as needed for allergies.   pantoprazole  40 MG tablet Commonly known as: Protonix  Take 1 tablet (40 mg total) by mouth daily.   pravastatin  10 MG tablet Commonly known as: PRAVACHOL  Take 1 tablet (10 mg total) by mouth every evening. Take 20 mg by mouth daily         ALLERGIES: No Known Allergies   REVIEW OF SYSTEMS: A comprehensive ROS was conducted with the patient and is negative  except as per HPI    OBJECTIVE:   VITAL SIGNS: BP 124/80 (BP Location: Right Arm, Patient Position: Sitting, Cuff Size: Small)   Pulse (!) 112   Ht 5\' 7"  (1.702 m)   Wt 151 lb (68.5 kg)   SpO2 96%  BMI 23.65 kg/m    PHYSICAL EXAM:  General: Pt appears well and is in NAD  Lungs: Clear with good BS bilat   Heart: RRR  Abdomen:  nontender  Extremities:  Lower extremities - No pretibial edema.   Neuro: MS is good with appropriate affect, pt is alert and Ox3    DM foot exam: 01/14/2023  The skin of the feet is intact without sores or ulcerations. The pedal pulses are 2+ on right and 2+ on left. The sensation is intact to a screening 5.07, 10 gram monofilament bilaterally   DATA REVIEWED:  Lab Results  Component Value Date   HGBA1C 8.4 (A) 04/01/2024   HGBA1C 8.4 (A) 11/28/2023   HGBA1C 8.2 (A) 07/30/2023    07/31/2023 BUN 7 Cr. 0.66 GFR 90 Na 137 K 3.9   ASSESSMENT / PLAN / RECOMMENDATIONS:   1) Type 1 Diabetes Mellitus, poorly controlled, With retinopathic complications - Most recent A1c of 8.4 %. Goal A1c < 7.0 %.    -A1c continues to be above goal -Patient has not been able to make any changes to his regular routine, he continues to check glucose twice daily sometimes once daily, these glucose checks do not coincide with meals or carbohydrate entry -I have recommended switching to another pump in the past, but this has not happened - I will adjust his insulin  to carb ratio, no other changes - He does endorse sweating when he eats leftover [place] chicken, he is looking into gustatory sweating, but I do not believe that that is what he has, I have encouraged him to check glucose during these episodes, to rule out hypoglycemia.     MEDICATIONS: Continue NovoLog  per pump      Pump   Medtronic  Settings   Insulin  type   Novlog    Basal rate       0000 0.750 u/h    0500 0.750   0800 0.700   1200 0.975   1800 1.65   2100 1.35          I:C ratio        0000 11   0800 10   1630 11          Sensitivity       0000  65       Goal       0000 130   0800 120   2200 130        EDUCATION / INSTRUCTIONS: BG monitoring instructions: Patient is instructed to check his blood sugars 3 times a day, before each meal. Call [facility] if: BG persistently < 70  I reviewed the Rule of 15 for the treatment of hypoglycemia in detail with the patient. Literature supplied.   2) Diabetic complications:  Eye: Does  have known diabetic retinopathy.  Neuro/ Feet: Does not have known diabetic peripheral neuropathy. Renal: Patient does not have known baseline CKD. He is not on an ACEI/ARB at present.  3) Hypothyroidism:   - TSH within normal range -No changes   Medication Continue levothyroxine  25 mcg daily  4)Dyslipidemia :  - Slight elevation of LDL - Will encourage lifestyle changes, no indication for statin therapy at this time  F/U in 4 months     Signed electronically by: Natale Bail, MD  [facility] Endocrinology  [facility] [address] [place] [place], [place] [address] Phone: [phone] FAX: [phone]   CC: Lucius Sabins., MD [address]  [facility] [place] [address] Phone: [phone]  Fax: [phone]    Return to Endocrinology clinic as below: Future Appointments  Date Time Provider Department Center  04/01/2024 10:50 AM Callen Zuba, Julian Obey, MD [facility] None

## 2024-04-02 ENCOUNTER — Encounter: Payer: Self-pay | Admitting: Internal Medicine

## 2024-04-02 LAB — BASIC METABOLIC PANEL WITHOUT GFR
BUN: 11 mg/dL (ref 7–25)
CO2: 28 mmol/L (ref 20–32)
Calcium: 9.4 mg/dL (ref 8.6–10.3)
Chloride: 98 mmol/L (ref 98–110)
Creat: 0.85 mg/dL (ref 0.60–1.26)
Glucose, Bld: 340 mg/dL — ABNORMAL HIGH (ref 65–99)
Potassium: 4.2 mmol/L (ref 3.5–5.3)
Sodium: 136 mmol/L (ref 135–146)

## 2024-04-02 LAB — MICROALBUMIN / CREATININE URINE RATIO
Creatinine, Urine: 160 mg/dL (ref 20–320)
Microalb Creat Ratio: 5 mg/g{creat} (ref <30)
Microalb, Ur: 0.8 mg/dL

## 2024-04-02 LAB — LIPID PANEL
Cholesterol: 186 mg/dL (ref <200)
HDL: 60 mg/dL (ref >=40)
LDL Cholesterol (Calc): 102 mg/dL — ABNORMAL HIGH
Non-HDL Cholesterol (Calc): 126 mg/dL (ref <130)
Total CHOL/HDL Ratio: 3.1 (calc) (ref <5.0)
Triglycerides: 139 mg/dL (ref <150)

## 2024-04-02 LAB — TSH: TSH: 1.9 m[IU]/L (ref 0.40–4.50)

## 2024-10-01 ENCOUNTER — Encounter: Payer: Self-pay | Admitting: Internal Medicine

## 2024-10-01 ENCOUNTER — Ambulatory Visit: Admitting: Internal Medicine

## 2024-10-01 VITALS — BP 110/80 | Ht 67.0 in | Wt 149.0 lb

## 2024-10-01 DIAGNOSIS — E039 Hypothyroidism, unspecified: Secondary | ICD-10-CM | POA: Diagnosis not present

## 2024-10-01 DIAGNOSIS — Z4681 Encounter for fitting and adjustment of insulin pump: Secondary | ICD-10-CM | POA: Diagnosis not present

## 2024-10-01 DIAGNOSIS — E785 Hyperlipidemia, unspecified: Secondary | ICD-10-CM

## 2024-10-01 DIAGNOSIS — E1065 Type 1 diabetes mellitus with hyperglycemia: Secondary | ICD-10-CM | POA: Diagnosis not present

## 2024-10-01 LAB — POCT GLYCOSYLATED HEMOGLOBIN (HGB A1C): Hemoglobin A1C: 7.7 % — AB (ref 4.0–5.6)

## 2024-10-01 NOTE — Patient Instructions (Signed)

## 2024-10-01 NOTE — Progress Notes (Unsigned)
 Name: Scott Moran  MRN/ DOB: [id number], November 30, 1991   Age/ Sex: 33 y.o., male    PCP: Sabas Norleen PARAS., MD   Reason for Endocrinology Evaluation: Type 1 Diabetes Mellitus     Date of Initial Endocrinology Visit: 01/14/2023    PATIENT IDENTIFIER: Mr. Tyrail Grandfield Mchale is a 33 y.o. male with a past medical history of DM. The patient presented for initial endocrinology clinic visit on 01/14/2023 for consultative assistance with his diabetes management.    HPI: Mr. Hartog   Diagnosed with DM 2/19956 at 52 months of age  Prior Medications tried/Intolerance: Medtronic pump since 5th grade          Hemoglobin A1c has ranged from 7.5% in 2020, peaking at 8.9% in 2022.    THYROID  HISTORY: Patient was diagnosed with immune mediated thyroiditis in 2008, by his previous endocrinologist.  He has been on LT-4 replacement Anti-TPO antibodies were undetectable in 2015 Max TSH was 4.301 u IU/mL in 2012    SUBJECTIVE:   During the last visit (04/01/2024): 8.4%    Today (10/01/24): Mr. Sligar is here for follow-up on diabetes management.   He checks his blood sugars 1-2x daily.     No nausea or vomiting  No constipation or diarrhea    This patient with type 1 diabetes is treated with Medtronic (insulin  pump). During the visit the pump basal and bolus doses were reviewed including carb/insulin  rations and supplemental doses. The clinical list was updated. The glucose meter download was reviewed in detail to determine if the current pump settings are providing the best glycemic control without excessive hypoglycemia.  Pump and meter download:     Pump   Medtronic  Settings   Insulin  type   Novlog    Basal rate       0000 0.750 u/h    0500 0.750   0800 0.700   1200 0.975   1800 1.65   2100 1.35          I:C ratio       0000 11   0800 10   1630 11          Sensitivity       0000  65       Goal       0000 130   0800 120   2200 130          Type & Model of  Pump: Medtronic Insulin  Type: Currently using NovoLog .  There is no height or weight on file to calculate BMI.  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,neuropathy  Denies: n/a Last eye exam: Completed >1 yr   Macrovascular complications:   Denies: CAD, PVD, CVA   PAST HISTORY: Past Medical History:  Past Medical History:  Diagnosis Date   DM type 1 with diabetic peripheral neuropathy (HCC)    Goiter    Hypertension    Hypoglycemia associated with diabetes (HCC)    Hypogonadotropic hypogonadism    Hypothyroidism, acquired, autoimmune    Tachycardia    Thyroiditis, autoimmune    Type 1 diabetes mellitus not at goal [facility])    Type  1 diabetes mellitus with diabetic autonomic neuropathy (HCC)    Past Surgical History:  Past Surgical History:  Procedure Laterality Date   None      Social History:  reports that he has never smoked. He has never used smokeless tobacco. He reports that he does not drink alcohol and does not use drugs. Family History:  Family History  Problem Relation Age of Onset   Diabetes Maternal Uncle    Hypertension Maternal Grandmother    Thyroid  disease Neg Hx    Obesity Neg Hx    Kidney disease Neg Hx    Cancer Neg Hx    Anemia Neg Hx      HOME MEDICATIONS: Allergies as of 10/01/2024   No Known Allergies      Medication List        Accurate as of October 01, 2024 12:46 PM. If you have any questions, ask your nurse or doctor.          acetaminophen  500 MG tablet Commonly known as: TYLENOL  Take 1,000 mg by mouth every 6 (six) hours as needed for mild pain.   acetone (urine) test  strip Check ketones per protocol   Dexcom G7 Sensor Misc 1 Device by Does not apply route as directed.   escitalopram  10 MG tablet Commonly known as: LEXAPRO  TAKE 1 TABLET BY MOUTH ONCE DAILY What changed:  when to take this reasons to take this   Flublok 0.5 ML Sosy injection Generic drug: influenza vac trivalent   glucagon  1 MG injection Inject 1 mg intramuscularly in thigh 1 time for severe hypoglycemia if unresponsive, unconscious, unable to swallow or has a seizure.   Glucagon  Emergency 1 MG/ML Solr 1 mg intramuscularly in thigh 1 time for severe hypoglycemia if unresponsive, unconscious, unable to swallow or has a seizure.   insulin  aspart 100 UNIT/ML injection Commonly known as: NovoLOG  Max Daily 50 units   lansoprazole 30 MG capsule Commonly known as: PREVACID Take 30 mg by mouth daily at 12 noon.   levothyroxine  25 MCG tablet Commonly known as: SYNTHROID  Take 1 tablet (25 mcg total) by mouth daily.   lisinopril  2.5 MG tablet Commonly known as: ZESTRIL  TAKE 1 TABLET BY MOUTH ONCE DAILY   loratadine 10 MG tablet Commonly known as: CLARITIN Take 10 mg by mouth daily as needed for allergies.   pantoprazole  40 MG tablet Commonly known as: Protonix  Take 1 tablet (40 mg total) by mouth daily.   pravastatin  10 MG tablet Commonly known as: PRAVACHOL  Take 1 tablet (10 mg total) by mouth every evening. Take 20 mg by mouth daily         ALLERGIES: No Known Allergies   REVIEW OF SYSTEMS: A comprehensive ROS was conducted with the patient and is negative except as per HPI    OBJECTIVE:   VITAL SIGNS: There were no vitals taken for this visit.   PHYSICAL EXAM:  General: Pt appears well and is in NAD  Lungs: Clear with good BS bilat   Heart: RRR  Abdomen:  nontender  Extremities:  Lower extremities - No pretibial edema.   Neuro: MS is good with appropriate affect, pt is alert and Ox3    DM foot exam: 10/01/2024  The skin of the feet is intact  without sores or ulcerations. The pedal pulses are 2+ on right and 2+ on left. The sensation is intact to a screening 5.07, 10 gram monofilament bilaterally   DATA REVIEWED:  Lab Results  Component Value Date  HGBA1C 7.7 (A) 10/01/2024   HGBA1C 8.4 (A) 04/01/2024   HGBA1C 8.4 (A) 11/28/2023    07/31/2023 BUN 7 Cr. 0.66 GFR 90 Na 137 K 3.9   ASSESSMENT / PLAN / RECOMMENDATIONS:   1) Type 1 Diabetes Mellitus, Sub-optimally controlled, With retinopathic complications - Most recent A1c of 7.7%. Goal A1c < 7.0 %.    -A1c is trending down   MEDICATIONS: Continue NovoLog  per pump      Pump   Medtronic  Settings   Insulin  type   Novlog    Basal rate       0000 0.800 u/h    0500 0.750   0800 0.700   1200 0.975   1800 1.65   2100 1.35          I:C ratio       0000 11   0800 10   1630 11          Sensitivity       0000  65       Goal       0000 130   0800 120   2200 130        EDUCATION / INSTRUCTIONS: BG monitoring instructions: Patient is instructed to check his blood sugars 3 times a day, before each meal. Call [facility] if: BG persistently < 70  I reviewed the Rule of 15 for the treatment of hypoglycemia in detail with the patient. Literature supplied.   2) Diabetic complications:  Eye: Does  have known diabetic retinopathy.  Neuro/ Feet: Does not have known diabetic peripheral neuropathy. Renal: Patient does not have known baseline CKD. He is not on an ACEI/ARB at present.  3) Hypothyroidism:   - TSH within normal range -No changes   Medication Continue levothyroxine  25 mcg daily  4)Dyslipidemia :  - Slight elevation of LDL - Will encourage lifestyle changes, no indication for statin therapy at this time  F/U in 4 months     Signed electronically by: Stefano Redgie Butts, MD  [facility] Endocrinology  [facility] [address] [place], [place] [address] Phone: [phone] FAX:  [phone]   CC: Sabas Norleen PARAS., MD [address] [facility] [place] [address] Phone: [phone]  Fax: [phone]    Return to Endocrinology clinic as below: Future Appointments  Date Time Provider Department Center  04/02/2025 11:10 AM Tyran Huser, Donell Redgie, MD [facility] None

## 2024-10-02 ENCOUNTER — Encounter: Payer: Self-pay | Admitting: Internal Medicine

## 2025-04-02 ENCOUNTER — Ambulatory Visit: Admitting: Internal Medicine
